# Patient Record
Sex: Female | Born: 1980
Health system: Southern US, Community
[De-identification: ages and names within clinical notes are randomized; demographics above are authoritative.]

## PROBLEM LIST (undated history)

## (undated) DIAGNOSIS — K219 Gastro-esophageal reflux disease without esophagitis: Secondary | ICD-10-CM

## (undated) DIAGNOSIS — M549 Dorsalgia, unspecified: Secondary | ICD-10-CM

## (undated) DIAGNOSIS — T7840XA Allergy, unspecified, initial encounter: Secondary | ICD-10-CM

## (undated) DIAGNOSIS — E538 Deficiency of other specified B group vitamins: Secondary | ICD-10-CM

## (undated) DIAGNOSIS — M1990 Unspecified osteoarthritis, unspecified site: Secondary | ICD-10-CM

## (undated) DIAGNOSIS — R609 Edema, unspecified: Secondary | ICD-10-CM

## (undated) DIAGNOSIS — F419 Anxiety disorder, unspecified: Secondary | ICD-10-CM

## (undated) DIAGNOSIS — D649 Anemia, unspecified: Secondary | ICD-10-CM

## (undated) DIAGNOSIS — Z9289 Personal history of other medical treatment: Secondary | ICD-10-CM

## (undated) DIAGNOSIS — U071 COVID-19: Secondary | ICD-10-CM

## (undated) DIAGNOSIS — I10 Essential (primary) hypertension: Secondary | ICD-10-CM

## (undated) DIAGNOSIS — R002 Palpitations: Secondary | ICD-10-CM

## (undated) DIAGNOSIS — D509 Iron deficiency anemia, unspecified: Secondary | ICD-10-CM

## (undated) DIAGNOSIS — G43909 Migraine, unspecified, not intractable, without status migrainosus: Secondary | ICD-10-CM

## (undated) HISTORY — DX: Anxiety disorder, unspecified: F41.9

## (undated) HISTORY — DX: Anemia, unspecified: D64.9

## (undated) HISTORY — DX: Essential (primary) hypertension: I10

## (undated) HISTORY — DX: Edema, unspecified: R60.9

## (undated) HISTORY — DX: Personal history of other medical treatment: Z92.89

## (undated) HISTORY — DX: Iron deficiency anemia, unspecified: D50.9

## (undated) HISTORY — DX: Deficiency of other specified B group vitamins: E53.8

## (undated) HISTORY — DX: Gastro-esophageal reflux disease without esophagitis: K21.9

## (undated) HISTORY — DX: Palpitations: R00.2

## (undated) HISTORY — DX: Allergy, unspecified, initial encounter: T78.40XA

## (undated) HISTORY — PX: REDUCTION MAMMAPLASTY: SUR839

## (undated) HISTORY — PX: HIGH RISK BREAST EXCISION: SHX6773

## (undated) HISTORY — DX: Unspecified osteoarthritis, unspecified site: M19.90

## (undated) HISTORY — PX: BREAST SURGERY: SHX581

## (undated) HISTORY — DX: Migraine, unspecified, not intractable, without status migrainosus: G43.909

## (undated) HISTORY — DX: Dorsalgia, unspecified: M54.9

## (undated) HISTORY — PX: CHOLECYSTECTOMY: SHX55

---

## 2000-03-16 ENCOUNTER — Other Ambulatory Visit: Admission: RE | Admit: 2000-03-16 | Discharge: 2000-03-16 | Payer: Self-pay | Admitting: *Deleted

## 2000-08-18 ENCOUNTER — Inpatient Hospital Stay (HOSPITAL_COMMUNITY): Admission: AD | Admit: 2000-08-18 | Discharge: 2000-08-18 | Payer: Self-pay | Admitting: Obstetrics & Gynecology

## 2000-10-07 ENCOUNTER — Inpatient Hospital Stay (HOSPITAL_COMMUNITY): Admission: AD | Admit: 2000-10-07 | Discharge: 2000-10-09 | Payer: Self-pay | Admitting: *Deleted

## 2000-11-08 ENCOUNTER — Other Ambulatory Visit: Admission: RE | Admit: 2000-11-08 | Discharge: 2000-11-08 | Payer: Self-pay | Admitting: *Deleted

## 2004-10-19 ENCOUNTER — Emergency Department (HOSPITAL_COMMUNITY): Admission: EM | Admit: 2004-10-19 | Discharge: 2004-10-19 | Payer: Self-pay | Admitting: Family Medicine

## 2005-01-02 ENCOUNTER — Observation Stay (HOSPITAL_COMMUNITY): Admission: AD | Admit: 2005-01-02 | Discharge: 2005-01-03 | Payer: Self-pay | Admitting: Obstetrics

## 2005-01-03 ENCOUNTER — Inpatient Hospital Stay (HOSPITAL_COMMUNITY): Admission: AD | Admit: 2005-01-03 | Discharge: 2005-01-04 | Payer: Self-pay | Admitting: Obstetrics

## 2005-01-10 ENCOUNTER — Inpatient Hospital Stay (HOSPITAL_COMMUNITY): Admission: AD | Admit: 2005-01-10 | Discharge: 2005-01-13 | Payer: Self-pay | Admitting: Obstetrics

## 2005-01-11 ENCOUNTER — Encounter (INDEPENDENT_AMBULATORY_CARE_PROVIDER_SITE_OTHER): Payer: Self-pay | Admitting: Specialist

## 2005-05-28 ENCOUNTER — Emergency Department (HOSPITAL_COMMUNITY): Admission: EM | Admit: 2005-05-28 | Discharge: 2005-05-28 | Payer: Self-pay | Admitting: Family Medicine

## 2005-08-20 ENCOUNTER — Emergency Department (HOSPITAL_COMMUNITY): Admission: EM | Admit: 2005-08-20 | Discharge: 2005-08-20 | Payer: Self-pay | Admitting: Family Medicine

## 2005-08-21 ENCOUNTER — Emergency Department (HOSPITAL_COMMUNITY): Admission: EM | Admit: 2005-08-21 | Discharge: 2005-08-21 | Payer: Self-pay | Admitting: Family Medicine

## 2006-04-15 ENCOUNTER — Emergency Department (HOSPITAL_COMMUNITY): Admission: EM | Admit: 2006-04-15 | Discharge: 2006-04-15 | Payer: Self-pay | Admitting: Family Medicine

## 2006-05-07 ENCOUNTER — Encounter: Payer: Self-pay | Admitting: Surgery

## 2006-05-07 ENCOUNTER — Ambulatory Visit (HOSPITAL_COMMUNITY): Admission: RE | Admit: 2006-05-07 | Discharge: 2006-05-07 | Payer: Self-pay | Admitting: Surgery

## 2006-05-14 ENCOUNTER — Encounter: Admission: RE | Admit: 2006-05-14 | Discharge: 2006-05-14 | Payer: Self-pay | Admitting: Surgery

## 2006-10-12 HISTORY — PX: GASTRIC BYPASS: SHX52

## 2007-01-10 ENCOUNTER — Encounter: Admission: RE | Admit: 2007-01-10 | Discharge: 2007-04-10 | Payer: Self-pay | Admitting: Surgery

## 2007-01-24 ENCOUNTER — Inpatient Hospital Stay (HOSPITAL_COMMUNITY): Admission: RE | Admit: 2007-01-24 | Discharge: 2007-01-27 | Payer: Self-pay | Admitting: Surgery

## 2007-01-25 ENCOUNTER — Encounter: Payer: Self-pay | Admitting: Vascular Surgery

## 2007-01-25 ENCOUNTER — Ambulatory Visit: Payer: Self-pay | Admitting: Vascular Surgery

## 2007-05-04 ENCOUNTER — Encounter: Admission: RE | Admit: 2007-05-04 | Discharge: 2007-06-13 | Payer: Self-pay | Admitting: Surgery

## 2007-08-19 ENCOUNTER — Other Ambulatory Visit: Admission: RE | Admit: 2007-08-19 | Discharge: 2007-08-19 | Payer: Self-pay | Admitting: Family Medicine

## 2007-11-25 ENCOUNTER — Emergency Department (HOSPITAL_COMMUNITY): Admission: EM | Admit: 2007-11-25 | Discharge: 2007-11-25 | Payer: Self-pay | Admitting: Emergency Medicine

## 2007-12-01 ENCOUNTER — Emergency Department (HOSPITAL_COMMUNITY): Admission: EM | Admit: 2007-12-01 | Discharge: 2007-12-01 | Payer: Self-pay | Admitting: Family Medicine

## 2007-12-26 ENCOUNTER — Emergency Department (HOSPITAL_COMMUNITY): Admission: EM | Admit: 2007-12-26 | Discharge: 2007-12-26 | Payer: Self-pay | Admitting: Family Medicine

## 2008-01-12 ENCOUNTER — Emergency Department (HOSPITAL_COMMUNITY): Admission: EM | Admit: 2008-01-12 | Discharge: 2008-01-12 | Payer: Self-pay | Admitting: Family Medicine

## 2008-09-25 ENCOUNTER — Emergency Department (HOSPITAL_COMMUNITY): Admission: EM | Admit: 2008-09-25 | Discharge: 2008-09-25 | Payer: Self-pay | Admitting: Emergency Medicine

## 2008-10-12 HISTORY — PX: OOPHORECTOMY: SHX86

## 2009-09-11 ENCOUNTER — Other Ambulatory Visit: Admission: RE | Admit: 2009-09-11 | Discharge: 2009-09-11 | Payer: Self-pay | Admitting: Family Medicine

## 2009-10-08 ENCOUNTER — Ambulatory Visit (HOSPITAL_COMMUNITY): Admission: RE | Admit: 2009-10-08 | Discharge: 2009-10-09 | Payer: Self-pay | Admitting: Obstetrics and Gynecology

## 2009-10-08 ENCOUNTER — Encounter (INDEPENDENT_AMBULATORY_CARE_PROVIDER_SITE_OTHER): Payer: Self-pay | Admitting: Obstetrics and Gynecology

## 2009-11-15 ENCOUNTER — Emergency Department (HOSPITAL_COMMUNITY): Admission: EM | Admit: 2009-11-15 | Discharge: 2009-11-15 | Payer: Self-pay | Admitting: Emergency Medicine

## 2010-01-07 ENCOUNTER — Inpatient Hospital Stay (HOSPITAL_COMMUNITY): Admission: AD | Admit: 2010-01-07 | Discharge: 2010-01-07 | Payer: Self-pay | Admitting: Obstetrics and Gynecology

## 2010-06-19 ENCOUNTER — Inpatient Hospital Stay (HOSPITAL_COMMUNITY): Admission: AD | Admit: 2010-06-19 | Discharge: 2010-06-21 | Payer: Self-pay | Admitting: Obstetrics and Gynecology

## 2010-06-19 ENCOUNTER — Encounter (INDEPENDENT_AMBULATORY_CARE_PROVIDER_SITE_OTHER): Payer: Self-pay | Admitting: Obstetrics and Gynecology

## 2010-09-27 ENCOUNTER — Emergency Department (HOSPITAL_COMMUNITY)
Admission: EM | Admit: 2010-09-27 | Discharge: 2010-09-27 | Payer: Self-pay | Source: Home / Self Care | Admitting: Family Medicine

## 2010-10-11 ENCOUNTER — Emergency Department (HOSPITAL_COMMUNITY)
Admission: EM | Admit: 2010-10-11 | Discharge: 2010-10-11 | Payer: Self-pay | Source: Home / Self Care | Admitting: Family Medicine

## 2010-11-02 ENCOUNTER — Encounter: Payer: Self-pay | Admitting: Obstetrics and Gynecology

## 2010-12-25 LAB — CBC
HCT: 31.9 % — ABNORMAL LOW (ref 36.0–46.0)
Hemoglobin: 8.2 g/dL — ABNORMAL LOW (ref 12.0–15.0)
MCH: 24.8 pg — ABNORMAL LOW (ref 26.0–34.0)
MCV: 75.6 fL — ABNORMAL LOW (ref 78.0–100.0)
MCV: 75.7 fL — ABNORMAL LOW (ref 78.0–100.0)
RBC: 3.33 MIL/uL — ABNORMAL LOW (ref 3.87–5.11)
RBC: 4.22 MIL/uL (ref 3.87–5.11)
WBC: 4.7 10*3/uL (ref 4.0–10.5)
WBC: 8 10*3/uL (ref 4.0–10.5)

## 2010-12-25 LAB — URINALYSIS, ROUTINE W REFLEX MICROSCOPIC
Bilirubin Urine: NEGATIVE
Glucose, UA: NEGATIVE mg/dL
Hgb urine dipstick: NEGATIVE
Ketones, ur: NEGATIVE mg/dL
pH: 6 (ref 5.0–8.0)

## 2010-12-31 LAB — POCT RAPID STREP A (OFFICE): Streptococcus, Group A Screen (Direct): POSITIVE — AB

## 2011-01-02 LAB — CBC
Hemoglobin: 9.8 g/dL — ABNORMAL LOW (ref 12.0–15.0)
MCHC: 32.1 g/dL (ref 30.0–36.0)
MCV: 75.7 fL — ABNORMAL LOW (ref 78.0–100.0)
RBC: 4.03 MIL/uL (ref 3.87–5.11)
WBC: 8.1 10*3/uL (ref 4.0–10.5)

## 2011-01-02 LAB — ABO/RH: ABO/RH(D): A POS

## 2011-01-12 LAB — CBC
HCT: 32.8 % — ABNORMAL LOW (ref 36.0–46.0)
Hemoglobin: 7.8 g/dL — ABNORMAL LOW (ref 12.0–15.0)
MCHC: 30.7 g/dL (ref 30.0–36.0)
Platelets: 458 10*3/uL — ABNORMAL HIGH (ref 150–400)
RBC: 3.46 MIL/uL — ABNORMAL LOW (ref 3.87–5.11)
RDW: 16.7 % — ABNORMAL HIGH (ref 11.5–15.5)
WBC: 5.8 10*3/uL (ref 4.0–10.5)
WBC: 7 10*3/uL (ref 4.0–10.5)

## 2011-01-12 LAB — ABO/RH: ABO/RH(D): A POS

## 2011-01-12 LAB — TYPE AND SCREEN: ABO/RH(D): A POS

## 2011-01-12 LAB — HEMOGLOBIN AND HEMATOCRIT, BLOOD
HCT: 25.4 % — ABNORMAL LOW (ref 36.0–46.0)
Hemoglobin: 8 g/dL — ABNORMAL LOW (ref 12.0–15.0)

## 2011-02-27 NOTE — Discharge Summary (Signed)
Meredith Byrd, ARCHBOLD          ACCOUNT NO.:  [id number]   MEDICAL RECORD NO.:  [id number]          PATIENT TYPE:  INP   LOCATION:  1605                         FACILITY:  [facility]   PHYSICIAN:  Sandria Bales. Ezzard Standing, M.D.  DATE OF BIRTH:  1981-06-17   DATE OF ADMISSION:  01/24/2007  DATE OF DISCHARGE:  01/27/2007                               DISCHARGE SUMMARY   DISCHARGE DIAGNOSES:  1. Morbid obesity with a body mass index (BMI) of 52, her weight is      262.  2. Knee and back pain/arthritic changes.  3. History of gestational diabetes mellitus.   OPERATIONS PERFORMED:  The patient underwent a laparoscopic Roux-en-Y  gastric bypass on the 14th of April, 2008.   HISTORY OF ILLNESS:  This is a 30 year old black female, who is a  patient of Dr. Deatra James, who has been morbidly obese much of her adult  life.  She has been through our bariatric preop program and now comes  for attempted laparoscopic Roux-en-Y gastric bypass.   She is troubled with some chronic back and knee pain, and she had a  history of gestational diabetes as comorbid problems associated with her  obesity.   HOSPITAL COURSE:  On the day of admission, she was taken to the  operating room where she underwent a laparoscopic Roux-en-Y gastric  bypass.   She did well from this procedure.  On the first postoperative day, she  was sore but doing well, her hemoglobin was 10, white blood count of  9900, she had a swallow done, which showed slow emptying; therefore, I  kept her NPO for an additional 24 hours.  Secondly, she had lower  extremity Doppler's, which showed no DVT.   On the second postoperative day, she was doing well, she had no nausea,  her hemoglobin was 9.5, hematocrit 29.7, white blood count of 6000.  Her  abdomen showed some rare bowel sounds.  I went on and decided to go  ahead and start her liquids and see how she would do with this.   By the third day, she was doing well, she had had no nausea, and  she  appeared to be ready for discharge.   DISCHARGE INSTRUCTIONS:  1. Clear liquids with her protein supplement per gastric diet.  2. She can walk, but no heavy lifting for about two weeks.  3. She already attends a bariatric support group.   FOLLOWUP:  1. She will see me back on the 23rd of April, 2008.  2. She is to call Dr. Gaynell Face for routine care.  3. She is going to a nutritionist on the 23rd of April, also.  4. She was given Roxicet liquid for pain.  5. She knows to call if there is any significant problem or concern.      Sandria Bales. Ezzard Standing, M.D.  Electronically Signed     DHN/MEDQ  D:  04/08/2007  T:  04/08/2007  Job:  [id number]   cc:   Deatra James, M.D.  Fax: [phone]   Kathreen Cosier, M.D.  Fax: [phone]

## 2011-02-27 NOTE — Op Note (Signed)
Meredith Byrd, Meredith Byrd          ACCOUNT NO.:  [id number]   MEDICAL RECORD NO.:  [id number]          PATIENT TYPE:  INP   LOCATION:  0002                         FACILITY:  [facility]   PHYSICIAN:  Meredith Byrd, M.D.  DATE OF BIRTH:  [birth year]   DATE OF PROCEDURE:  01/24/2007  DATE OF DISCHARGE:                               OPERATIVE REPORT   PREOPERATIVE DIAGNOSIS:  Morbid obesity with a BMI of 52.1, and a weight  of 262 pounds.   POSTOPERATIVE DIAGNOSIS:  Morbid obesity with a BMI of 52.1, and a  weight of 262 pounds.   PROCEDURE:  1. Laparoscopic Roux-en-Y bypass, anticolic, antigastric.  2. Upper endoscopy.   SURGEON:  Ovidio Kin, M.D.   FIRST ASSISTANT:  Thornton Park. Daphine Deutscher, MD   ANESTHESIA:  General endotracheal.   ESTIMATED BLOOD LOSS:  Minimal.   INDICATIONS FOR PROCEDURE:  [name] is a 30 year-old black female  who is morbidly obese and has been overweight much of her adult life.  She has been to our preoperative Bariatric program; and now comes for  attempted laparoscopic Roux-en-Y gastric bypass.   Indications for procedure, complications of surgery explained to the  patient.  Procedure complications include: bleeding, infection, bowel  leak, deep venous thrombosis, clots to her lung, and long-term  nutritional consequences.  Also, of note, her mother has had bypass  surgery; so she is well-informed of the operation and its consequences.   OPERATIVE NOTE:  The patient presented to the operating room and  underwent a general endotracheal anesthetic.  She had a timeout,  identifying the patient and the planned procedure.  She had a Foley  catheter in place.  She was given a gram of cefoxitin at the initiation  of the procedure, and had PAS stockings in place.  Her abdomen was  prepped with Betadine solution and sterilely draped.   Her abdominal cavity was accessed using the Opti-View Ethicon 12-mm  trocar to access the left upper quadrant.  I then  placed six additional  trocars a 5-mm trocar in the subxiphoid location for the liver  retractor, a right subcostal 12-mm trocar, a right paramedian trocar, a  left paramedian 12-mm trocar, a lateral 5-mm trocar, and then an 11-mm  trocar to the left, right, and below her umbilicus.  The abdominal  exploration revealed the right-and-left lobes of the liver unremarkable.  Anterior wall of the stomach was also unremarkable.  The gallbladder was  absent.  The appendix, actually was easily seen.  The remainder of her  colon was unremarkable.  She actually had a lot of her fat in her  abdominal wall with a lot less fat in her abdominal cavity.   I first identified and divided the jejunum.  I counted 40 cm down from  the ligament of Treitz and I divided the jejunum, I put a penrose drain  on the future gastric limb; and then counted 100 cm down this.  I did a  side-to-side, jejunojejunostomy with a 45 mm white load of the Ethicon  stapler.  I then closed the enterotomy with two running 2-0 Vicryl  sutures.  I then closed the mesenteric defect with a running 2-0 silk  suture with laper ties on both ends.  After examining the J-J  anastomosis, and the mesenteric defect closure, I covered the J-J  anastomosis with Tisseal.   I then turned my attention to the upper abdomen and placed the patient  in reverse Trendelenburg.  I went along the gastroesophageal junction on  the left, and opened the angle of HISs.  I then went along the stomach  wall and counted about 5 cm down on the lesser curvature, got into the  lesser sack through the lesser curvature and then divided the stomach.  I used a blue load 45 stapler first.  I then used two blue loads of the  60 mm stapler; on the third stapling QT wall and through this passed an  E-wall tube without difficulty into the pouch to make sure that there  was no impingement on the esophageal junction.  I then passed about  another 45 mm [place] surgical suture  stapler.  I then brought out the  jejunal limb, anticolic, antigastric, and did a posterior running 2-0  Vicryl suture.  I then made enterotomies in the stomach and jejunum; and  used the 45 blue stapler, to fire and create a gastrojejunostomy.  I  then closed this enterotomy defect with two running 2-0 Vicryl sutures.   At this point, I closed the closure anteriorly with a running 2-0 Vicryl  suture.  This took tension off the staple line.  I then closed the  Peterson's defect with a running 2-0 silk between the mesentery of the  jejunum to the transverse colon into the stomach wall.   At this point Dr. Daphine Deutscher broke scrub, went up to the head of the bed  where he endoscoped the patient, while I clamped off the jejunal limb.  He could visualize the distal esophagus, the gastric pouch, and the  gastrojejunostomy.  The gastric pouch was approximately 5 cm in  cylindrical. The patch anastomosis was widely open, and I flooded the  upper abdomen with saline; and there was no leak during this maneuver.   Dr. Daphine Deutscher will dictate his part of the operation.   I then placed Tisseal over the gastrojejunostomy.  I had oversewn the  distal gastric limb with a running 2-0 Vicryl suture, this was actually  before the anastomosis.  I then we inspected the J-J;  I reinspected the  gastrojejunostomy anastomosis, and I felt that there was no tension or  pulling on any of the limbs.  The serosa of the bowel was all pink; and  the procedure had gone well.  The sponge and needle counts were correct  at the end of the case.  She was transferred to the recovery room in  good condition.      Meredith Byrd, M.D.  Electronically Signed     DHN/MEDQ  D:  01/24/2007  T:  01/24/2007  Job:  [id number]   cc:   Meredith Byrd, M.D.  Fax: [phone]   Meredith Byrd, M.D.  Fax: [phone]

## 2011-07-03 LAB — POCT RAPID STREP A: Streptococcus, Group A Screen (Direct): NEGATIVE

## 2011-07-17 LAB — POCT PREGNANCY, URINE: Preg Test, Ur: NEGATIVE

## 2011-11-20 ENCOUNTER — Telehealth (HOSPITAL_COMMUNITY): Payer: Self-pay | Admitting: *Deleted

## 2011-11-20 NOTE — ED Notes (Addendum)
Pt. called and asked for the name of the medication that was prescribed 10/11/10, so she can tell her doctor. I told her it was Butorphanal tartrate nasal spray 10 mg/ml. 1 spray in one nostril every 3-4 hrs prn pain. Vassie Moselle 11/20/2011

## 2013-04-13 ENCOUNTER — Other Ambulatory Visit: Payer: Self-pay | Admitting: Gastroenterology

## 2013-04-13 DIAGNOSIS — R109 Unspecified abdominal pain: Secondary | ICD-10-CM

## 2013-04-20 ENCOUNTER — Other Ambulatory Visit: Payer: Self-pay

## 2013-04-20 ENCOUNTER — Ambulatory Visit
Admission: RE | Admit: 2013-04-20 | Discharge: 2013-04-20 | Disposition: A | Discharge status: Home / Self Care | Payer: 59 | Source: Ambulatory Visit | Attending: Gastroenterology | Admitting: Gastroenterology

## 2013-04-20 DIAGNOSIS — R109 Unspecified abdominal pain: Secondary | ICD-10-CM

## 2013-04-20 MED ORDER — IOHEXOL 300 MG/ML  SOLN
125.0000 mL | Freq: Once | INTRAMUSCULAR | Status: AC | PRN
  Administered 2013-04-20: 125 mL via INTRAVENOUS

## 2014-03-22 ENCOUNTER — Ambulatory Visit (INDEPENDENT_AMBULATORY_CARE_PROVIDER_SITE_OTHER): Payer: 59 | Admitting: Family Medicine

## 2014-03-22 ENCOUNTER — Encounter: Payer: Self-pay | Admitting: Family Medicine

## 2014-03-22 VITALS — BP 142/86 | HR 97 | Temp 99.3°F | Wt 213.0 lb

## 2014-03-22 DIAGNOSIS — G43909 Migraine, unspecified, not intractable, without status migrainosus: Secondary | ICD-10-CM

## 2014-03-22 DIAGNOSIS — E559 Vitamin D deficiency, unspecified: Secondary | ICD-10-CM

## 2014-03-22 DIAGNOSIS — G4700 Insomnia, unspecified: Secondary | ICD-10-CM | POA: Insufficient documentation

## 2014-03-22 DIAGNOSIS — R002 Palpitations: Secondary | ICD-10-CM

## 2014-03-22 DIAGNOSIS — D649 Anemia, unspecified: Secondary | ICD-10-CM

## 2014-03-22 DIAGNOSIS — I10 Essential (primary) hypertension: Secondary | ICD-10-CM

## 2014-03-22 DIAGNOSIS — K219 Gastro-esophageal reflux disease without esophagitis: Secondary | ICD-10-CM

## 2014-03-22 DIAGNOSIS — Z9884 Bariatric surgery status: Secondary | ICD-10-CM

## 2014-03-22 LAB — CBC
HEMATOCRIT: 26.3 % — AB (ref 36.0–46.0)
HEMOGLOBIN: 7.4 g/dL — AB (ref 12.0–15.0)
MCH: 16.5 pg — AB (ref 26.0–34.0)
MCHC: 28.1 g/dL — AB (ref 30.0–36.0)
MCV: 58.6 fL — AB (ref 78.0–100.0)
Platelets: 491 10*3/uL — ABNORMAL HIGH (ref 150–400)
RBC: 4.49 MIL/uL (ref 3.87–5.11)
RDW: 22.8 % — ABNORMAL HIGH (ref 11.5–15.5)
WBC: 5.6 10*3/uL (ref 4.0–10.5)

## 2014-03-22 MED ORDER — BUTORPHANOL TARTRATE 10 MG/ML NA SOLN: 1.0000 | NASAL | Status: DC | PRN

## 2014-03-22 MED ORDER — TRAZODONE HCL 50 MG PO TABS: 100.0000 mg | ORAL_TABLET | Freq: Every evening | ORAL | Status: DC | PRN

## 2014-03-22 MED ORDER — GABAPENTIN 300 MG PO CAPS: 300.0000 mg | ORAL_CAPSULE | Freq: Three times a day (TID) | ORAL | Status: DC

## 2014-03-22 MED ORDER — CILIDINIUM-CHLORDIAZEPOXIDE 2.5-5 MG PO CAPS: ORAL_CAPSULE | ORAL | Status: DC

## 2014-03-22 MED ORDER — ALOSETRON HCL 0.5 MG PO TABS: 0.5000 mg | ORAL_TABLET | Freq: Every day | ORAL | Status: DC

## 2014-03-22 MED ORDER — AMLODIPINE BESYLATE 10 MG PO TABS: 10.0000 mg | ORAL_TABLET | Freq: Every day | ORAL | Status: DC

## 2014-03-22 NOTE — Progress Notes (Signed)
Subjective:    Patient ID: Meredith Byrd, female    DOB: 1981/03/27, 33 y.o.   MRN: [id number]  HPI 33 year old female presents today to establish care.  Current concerns:  1) Palpitations  Patient reports that she has had palpitations for approximately 4-5 months.  She states that they occur intermittently.  No inciting, exacerbating, or alleviating factors.  She reports that palpitations last for 10-15 minutes and then subsequently resolve.  Some episodes of palpitations are associated with sternal chest pain.    She's taken no medications for this.  She states that she has had this before.  She states that she saw a cardiologist at that time and had an unremarkable workup.  PMH, Surgical Hx, Family Hx, Social Hx, medications reviewed (see below). Past Medical History  Diagnosis Date  . Allergy   . Anemia   . GERD (gastroesophageal reflux disease)   . Hypertension   . Migraines   . Palpitations    Past Surgical History  Procedure Laterality Date  . Cholecystectomy    . Breast surgery    . Gastric bypass  2008  . Oophorectomy Right 2010   Family History  Problem Relation Age of Onset  . Asthma Mother   . Heart disease Mother   . Diabetes Mother   . Early death Mother   . Hypertension Mother   . Stroke Maternal Grandmother   . Heart disease Maternal Grandmother   . Hypertension Maternal Grandmother   . Hyperlipidemia Maternal Grandmother    History   Social History  . Marital Status: Married    Spouse Name: N/A    Number of Children: N/A  . Years of Education: N/A   Social History Main Topics  . Smoking status: Never Smoker   . Smokeless tobacco: Not on file  . Alcohol Use: Yes     Comment: Occasionally; 1x/month  . Drug Use: No  . Sexual Activity: Yes   Other Topics Concern  . Not on file   Social History Narrative  . No narrative on file   Medications: Current outpatient prescriptions:alosetron (LOTRONEX) 0.5 MG tablet, Take 1  tablet (0.5 mg total) by mouth daily., Disp: 90 tablet, Rfl: 0;  amLODipine (NORVASC) 10 MG tablet, Take 1 tablet (10 mg total) by mouth daily., Disp: 90 tablet, Rfl: 3;  butorphanol (STADOL) 10 MG/ML nasal spray, Place 1 spray into the nose every 4 (four) hours as needed for headache., Disp: 2.5 mL, Rfl: 0 clidinium-chlordiazePOXIDE (LIBRAX) 5-2.5 MG per capsule, 2 capsules 4 times daily., Disp: 60 capsule, Rfl: 3;  gabapentin (NEURONTIN) 300 MG capsule, Take 1 capsule (300 mg total) by mouth 3 (three) times daily., Disp: 90 capsule, Rfl: 3;  traZODone (DESYREL) 50 MG tablet, Take 2 tablets (100 mg total) by mouth at bedtime as needed for sleep., Disp: 60 tablet, Rfl: 6  Allergies: NKDA  Review of Systems Per HPI.    Objective:   Physical Exam Filed Vitals:   03/22/14 1116  BP: 142/86  Pulse: 97  Temp: 99.3 F (37.4 C)   Exam: General: well appearing obese female in NAD.  HEENT: NCAT. Oropharynx clear.  Dental carie noted (right upper molar).   Cardiovascular: RRR. 2/6 Systolic murmur noted. Respiratory: CTAB. No rales, rhonchi, or wheeze. Abdomen: obese, soft, nontender, nondistended. Extremities: Trace LE edema.    Assessment & Plan:  See Problem List  45 minutes were spent face-to-face with the patient during this encounter and over half of that time was  spent on counseling and coordination of care.

## 2014-03-22 NOTE — Assessment & Plan Note (Signed)
Patient with history of gastric bypass.  Patient still obese with weight of 213. I gave patient Dr. Larae Grooms information so that she can schedule an appointment for nutritional counseling/advice.

## 2014-03-22 NOTE — Assessment & Plan Note (Signed)
Trazodone refilled.

## 2014-03-22 NOTE — Patient Instructions (Addendum)
It was nice to see you today.  I have refilled your medications.  I have also prescribed gabapentin for you numbness/tingling.  You will receive a letter regarding you lab work up.  Follow up in  ~ 6 months.

## 2014-03-22 NOTE — Assessment & Plan Note (Signed)
Well controlled. Goal BP <140/90. Will continue Losartan and Norvasc. Patient unsure of dose of losartan. Patient is to call back with dose so this can be refilled for her.

## 2014-03-22 NOTE — Assessment & Plan Note (Signed)
Unclear etiology.  DDx: Paroxysmal atrial fibrillation, paroxysmal SVT. Patient in need of Holter monitor.  Will place referral to cardiology for evaluation/cardiac monitoring.  I do not proceed with EKG today as patient was in sinus rhythm and thus unlikely to have any significant changes on EKG.

## 2014-03-22 NOTE — Assessment & Plan Note (Signed)
Patient reports history of iron deficiency as well as B12 deficiency. Obtaining anemia panel for evaluation today (recent labs not available in EMR).

## 2014-03-23 ENCOUNTER — Encounter: Payer: Self-pay | Admitting: Family Medicine

## 2014-03-23 LAB — COMPLETE METABOLIC PANEL WITH GFR
ALK PHOS: 64 U/L (ref 39–117)
ALT: 22 U/L (ref 0–35)
AST: 66 U/L — ABNORMAL HIGH (ref 0–37)
Albumin: 4.2 g/dL (ref 3.5–5.2)
BILIRUBIN TOTAL: 0.4 mg/dL (ref 0.2–1.2)
BUN: 5 mg/dL — AB (ref 6–23)
CO2: 23 mEq/L (ref 19–32)
Calcium: 8.8 mg/dL (ref 8.4–10.5)
Chloride: 104 mEq/L (ref 96–112)
Creat: 0.65 mg/dL (ref 0.50–1.10)
GFR, Est African American: >89 mL/min
GLUCOSE: 61 mg/dL — AB (ref 70–99)
Potassium: 5 mEq/L (ref 3.5–5.3)
Sodium: 138 mEq/L (ref 135–145)
Total Protein: 7.4 g/dL (ref 6.0–8.3)

## 2014-03-23 LAB — ANEMIA PANEL
%SAT: 2 % — ABNORMAL LOW (ref 20–55)
ABS RETIC: 40.4 10*3/uL (ref 19.0–186.0)
Ferritin: 4 ng/mL — ABNORMAL LOW (ref 10–291)
Folate: 13.2 ng/mL
IRON: 10 ug/dL — AB (ref 42–145)
RBC.: 4.49 MIL/uL (ref 3.87–5.11)
RETIC CT PCT: 0.9 % (ref 0.4–2.3)
TIBC: 492 ug/dL — ABNORMAL HIGH (ref 250–470)
UIBC: 482 ug/dL — AB (ref 125–400)
VITAMIN B 12: 333 pg/mL (ref 211–911)

## 2014-03-23 LAB — LIPID PANEL
Cholesterol: 108 mg/dL (ref 0–200)
HDL: 44 mg/dL (ref >39)
LDL Cholesterol: 54 mg/dL (ref 0–99)
TRIGLYCERIDES: 51 mg/dL (ref <150)
Total CHOL/HDL Ratio: 2.5 Ratio
VLDL: 10 mg/dL (ref 0–40)

## 2014-03-23 LAB — VITAMIN D 25 HYDROXY (VIT D DEFICIENCY, FRACTURES): VIT D 25 HYDROXY: 13 ng/mL — AB (ref 30–89)

## 2014-03-28 ENCOUNTER — Telehealth: Payer: Self-pay | Admitting: Family Medicine

## 2014-03-28 NOTE — Telephone Encounter (Signed)
I will forward to white team to see when we can get this setup.  Alvino ChapelJo,   Patient needs outpatient IV Iron.  I don't know where she has to go for this.

## 2014-03-28 NOTE — Telephone Encounter (Signed)
Placed form to be filled out on this in your box

## 2014-03-28 NOTE — Telephone Encounter (Signed)
Left voicemail regarding lab results.

## 2014-03-28 NOTE — Telephone Encounter (Signed)
Patient received lab results in mail, wanted to converse with your regarding results.  Called to speak with you and need you to contact her back at earliest convenience.  If she is not able to answer.  Please leave her a detailed message and if you wish for her to return call, please leave a number where you can be reached.

## 2014-03-28 NOTE — Telephone Encounter (Signed)
[name] want to know if she is going to see you on the same day of the procedure for the IV or is it to be separate dates.  Please call her back to inform

## 2014-03-30 ENCOUNTER — Other Ambulatory Visit (HOSPITAL_COMMUNITY): Payer: Self-pay | Admitting: *Deleted

## 2014-04-02 ENCOUNTER — Inpatient Hospital Stay (HOSPITAL_COMMUNITY): Admission: RE | Admit: 2014-04-02 | Payer: 59 | Source: Ambulatory Visit

## 2014-04-04 ENCOUNTER — Telehealth: Payer: Self-pay | Admitting: Family Medicine

## 2014-04-04 MED ORDER — GABAPENTIN 300 MG PO CAPS: 600.0000 mg | ORAL_CAPSULE | Freq: Three times a day (TID) | ORAL | Status: DC

## 2014-04-04 NOTE — Telephone Encounter (Signed)
Patient also will like the Gabapentin to another medication she states that her and her boyfriend does not like the way it makes her feel. It looks like the IV was set up for 6/22 @ am. I had faxed over the form completed to them. It seems like they did not contact patient about this appointment, I will call and reschedule.

## 2014-04-04 NOTE — Telephone Encounter (Signed)
I do not know status of cardiology referral.  This would need to come from staff. Regarding IV iron, once again I do not know if they do this on the weekend. Defer to [place] DecSara. She needs follow up lab work in 1 month after IV Iron and then repeat IV Iron if needed (so she would have to be seen). Will increase dose of Gabapentin for sciatica - Patient to take 600 mg TID.

## 2014-04-04 NOTE — Addendum Note (Signed)
Addended by: Tommie SamsOOK, JAYCE G on: 04/04/2014 01:45 PM   Modules accepted: Orders, Medications

## 2014-04-04 NOTE — Telephone Encounter (Signed)
Please advise.thank you. Meredith Byrd, Meredith Byrd  

## 2014-04-04 NOTE — Telephone Encounter (Signed)
Patient calls with a few questions. She states that Dr. Adriana Simasook wants her to get IV transfusion for iron, she is wanting to know will this be scheduled or does she need to come see Dr. Adriana Simasook again first? Also, could this be scheduled on the weekend, instead of weekdays so she does not have to miss work? Gabapentin not working for sciatica, would like to try something else. Also, status of cardiologist referral.

## 2014-04-05 MED ORDER — PREGABALIN 50 MG PO CAPS: 50.0000 mg | ORAL_CAPSULE | Freq: Three times a day (TID) | ORAL | Status: DC

## 2014-04-05 NOTE — Addendum Note (Signed)
Addended by: Tommie SamsOOK, Aireanna Luellen G on: 04/05/2014 02:47 PM   Modules accepted: Orders

## 2014-04-06 ENCOUNTER — Telehealth: Payer: Self-pay | Admitting: Family Medicine

## 2014-04-06 NOTE — Telephone Encounter (Signed)
I spoke with patient again about this and discussed the infusion. She wanted to know if this is something that would need to be done for the rest of her life or intermittently. I explained to her that it would all depend how she responds to the IV infusions. She is wanting to know that based on her labs is this something she will actually be able to hold off until September due to her job, or would you like her to get done sooner.

## 2014-04-06 NOTE — Telephone Encounter (Signed)
I would like her to get at least 1 injection given her Hemoglobin of 7.4.

## 2014-04-06 NOTE — Telephone Encounter (Signed)
Pt wants to know how many iron infusions dr wants done. She is unable to get in to have until Sept, Is that ok? Please advise

## 2014-04-10 ENCOUNTER — Encounter (HOSPITAL_COMMUNITY): Payer: 59

## 2014-04-11 ENCOUNTER — Telehealth: Payer: Self-pay | Admitting: Internal Medicine

## 2014-04-11 NOTE — Telephone Encounter (Signed)
Lyrica is not touching the pain. Can he call in something stronger? This is for her and her husband [name]  [birth year]

## 2014-04-11 NOTE — Telephone Encounter (Signed)
Spoke with patient and she would not like a new medication just a dosage upage

## 2014-04-12 ENCOUNTER — Other Ambulatory Visit: Payer: Self-pay | Admitting: Family Medicine

## 2014-04-12 ENCOUNTER — Telehealth: Payer: Self-pay | Admitting: Family Medicine

## 2014-04-12 MED ORDER — PREGABALIN 100 MG PO CAPS: 100.0000 mg | ORAL_CAPSULE | Freq: Three times a day (TID) | ORAL | Status: DC

## 2014-04-12 NOTE — Telephone Encounter (Signed)
LVM for patient to call back new rx up front

## 2014-04-12 NOTE — Telephone Encounter (Signed)
Spoke with patient and informed her of below 

## 2014-04-12 NOTE — Telephone Encounter (Signed)
Pt called again to check on status of her request to up the dosage on Lyrica

## 2014-04-12 NOTE — Telephone Encounter (Signed)
Please return her call asap °

## 2014-04-16 NOTE — Telephone Encounter (Signed)
Closed encounter °

## 2014-04-19 ENCOUNTER — Encounter: Payer: Self-pay | Admitting: *Deleted

## 2014-04-27 ENCOUNTER — Encounter: Payer: Self-pay | Admitting: Cardiology

## 2014-05-01 ENCOUNTER — Ambulatory Visit (INDEPENDENT_AMBULATORY_CARE_PROVIDER_SITE_OTHER): Payer: 59 | Admitting: Internal Medicine

## 2014-05-01 ENCOUNTER — Encounter: Payer: Self-pay | Admitting: Internal Medicine

## 2014-05-01 VITALS — BP 118/68 | HR 89 | Ht <= 58 in | Wt 208.1 lb

## 2014-05-01 DIAGNOSIS — Z9884 Bariatric surgery status: Secondary | ICD-10-CM

## 2014-05-01 DIAGNOSIS — G4719 Other hypersomnia: Secondary | ICD-10-CM

## 2014-05-01 DIAGNOSIS — E669 Obesity, unspecified: Secondary | ICD-10-CM

## 2014-05-01 DIAGNOSIS — G4489 Other headache syndrome: Secondary | ICD-10-CM

## 2014-05-01 DIAGNOSIS — R079 Chest pain, unspecified: Secondary | ICD-10-CM

## 2014-05-01 DIAGNOSIS — R0609 Other forms of dyspnea: Secondary | ICD-10-CM

## 2014-05-01 DIAGNOSIS — I10 Essential (primary) hypertension: Secondary | ICD-10-CM

## 2014-05-01 DIAGNOSIS — G4700 Insomnia, unspecified: Secondary | ICD-10-CM

## 2014-05-01 DIAGNOSIS — R0989 Other specified symptoms and signs involving the circulatory and respiratory systems: Secondary | ICD-10-CM

## 2014-05-01 DIAGNOSIS — R002 Palpitations: Secondary | ICD-10-CM

## 2014-05-01 DIAGNOSIS — G4710 Hypersomnia, unspecified: Secondary | ICD-10-CM

## 2014-05-01 DIAGNOSIS — G43109 Migraine with aura, not intractable, without status migrainosus: Secondary | ICD-10-CM

## 2014-05-01 DIAGNOSIS — R0683 Snoring: Secondary | ICD-10-CM

## 2014-05-01 DIAGNOSIS — R5381 Other malaise: Secondary | ICD-10-CM

## 2014-05-01 DIAGNOSIS — R0789 Other chest pain: Secondary | ICD-10-CM

## 2014-05-01 DIAGNOSIS — R5383 Other fatigue: Secondary | ICD-10-CM

## 2014-05-01 NOTE — Progress Notes (Signed)
OFFICE NOTE  Chief Complaint:  Chest pain, palpitations  Primary Care Physician: Meredith Byrd, Meredith Byrd, Meredith Byrd  HPI:  Meredith Byrd is a 33 year old female who was previously seen by Dr. Herbie Byrd in our practice in 2012. She's had a history of chest pain which has been present for many years as well as hypertension, obesity and chronic headaches of the migraine type. She underwent a treadmill exercise stress test in 2012. This was negative for ischemia however she did only exercised for 3-1/2 minutes achieving 5.3 metabolic equivalents which is quite poor for a 33 year old. She did great reach 87% of the max predicted heart rate. No chest pain was reported during the study. She also underwent an echocardiogram which showed borderline concentric LVH with EF greater than 55%. There was mild mitral annular calcification and trace mitral regurgitation. An insignificant pericardial effusion was noted. He was placed on losartan for blood pressure control and continues to take that medication. Her main concerns today has to Meredith Byrd with more frequent palpitations and more intense chest pain, which he notes with exertion and at rest. She describes the chest pain is sharp, substernal and nonradiating. Sometimes is worse when she takes a deep breath and it has to cause her to hold her breath. It is rated an 8 or 9/10 but goes away within a few seconds to a minute. She gets short of breath with exercise and does report some swelling in her ankles. She has had about 8 pound weight loss since her last office visit however body mass index is still over 40 and she does have a history of gastric bypass surgery. Blood pressure appears well controlled today. She's not on any AV nodal blockers.  Past Medical History  Diagnosis Date  . Allergy   . Anemia   . GERD (gastroesophageal reflux disease)   . Hypertension   . Migraines   . Palpitations   . Hx of echocardiogram     The echocadiogram was essentially normal with the  exception of mild mitral calcification and borderline concentric LVH, which in the setting of her hypertension at this early age is something that mean her blood pressure is well controlled.   [name]. History of stress test 02/2011 ([facility])    there was no evidence of ischemia, but she only went 3 1/2 minutes on the treadmill making it very difficult to get a good accurate assessment, however    Past Surgical History  Procedure Laterality Date  . Cholecystectomy    . Breast surgery    . Gastric bypass  2008  . Oophorectomy Right 2010    FAMHx:  Family History  Problem Relation Age of Onset  . Asthma Mother   . Heart disease Mother   . Diabetes Mother   . Early death Mother   . Hypertension Mother   . Stroke Maternal Grandmother   . Heart disease Maternal Grandmother   . Hypertension Maternal Grandmother   . Hyperlipidemia Maternal Grandmother     SOCHx:   reports that she has never smoked. She has never used smokeless tobacco. She reports that she does not drink alcohol or use illicit drugs.  ALLERGIES:  No Known Allergies  ROS: A comprehensive review of systems was negative except for: Constitutional: positive for fatigue Respiratory: positive for dyspnea on exertion Cardiovascular: positive for chest pain and palpitations  HOME MEDS: Current Outpatient Prescriptions  Medication Sig Dispense Refill  . alosetron (LOTRONEX) 0.5 MG tablet Take 1 mg by mouth 2 (two) times  daily.      . amLODipine (NORVASC) 10 MG tablet Take 1 tablet (10 mg total) by mouth daily.  90 tablet  3  . butorphanol (STADOL) 10 MG/ML nasal spray Place 1 spray into the nose every 4 (four) hours as needed for headache.  2.5 mL  0  . clidinium-chlordiazePOXIDE (LIBRAX) 5-2.5 MG per capsule 2 capsules 4 times daily.  60 capsule  3  . Cyanocobalamin (VITAMIN B-12 IJ) Inject as directed every 30 (thirty) days.      . MedroxyPROGESTERone Acetate (DEPO-PROVERA IM) Inject into the muscle every 3 (three) months.        . pregabalin (LYRICA) 100 MG capsule Take 1 capsule (100 mg total) by mouth 3 (three) times daily.  90 capsule  3  . traZODone (DESYREL) 50 MG tablet Take 50-100 mg by mouth at bedtime as needed for sleep.      . Vitamin D, Ergocalciferol, (DRISDOL) 50000 UNITS CAPS capsule Take 50,000 Units by mouth every 7 (seven) days.       No current facility-administered medications for this visit.    LABS/IMAGING: No results found for this or any previous visit (from the past 48 hour(s)). No results found.  VITALS: BP 118/68  Pulse 89  Ht 4\' 10"  (1.473 m)  Wt 208 lb 1.6 oz (94.394 kg)  BMI 43.50 kg/m2  EXAM: General appearance: alert and no distress Neck: no carotid bruit and no JVD Lungs: clear to auscultation bilaterally Heart: regular rate and rhythm, S1, S2 normal, no murmur, click, rub or gallop Abdomen: soft, non-tender; bowel sounds normal; no masses,  no organomegaly Extremities: extremities normal, atraumatic, no cyanosis or edema Pulses: 2+ and symmetric Skin: Skin color, texture, turgor normal. No rashes or lesions Neurologic: Grossly normal Psych: Mood, affect normal  EKG: Normal sinus rhythm at 89  ASSESSMENT: 1. Atypical chest pain for cardiac cause 2. Palpitations 3. Dyspnea and exertion 4. Snoring, fatigue, poor sleep, headaches, EPWSS >10 5. HTN - controlled  PLAN: 1.   Mrs. Meredith Byrd is describing more frequent palpitations and chest discomfort which sounds atypical for cardiac chest pain. There is a strong family history of heart disease. I would recommend a repeat treadmill exercise stress test to further evaluate for ischemia. In addition we'll arrange for a one-week event monitor in the office today. She reports she has episodes of palpitations at least 4-5 times a week. She also reports snoring, fatigue, poor sleep, headaches and at sleepiness score greater than 10, this is concerning for possible sleep apnea. I would recommend a sleep apnea evaluation which  controlled her palpitations.  Plan to see her back to discuss results of these studies. Thank you again for the kind referral.  Chrystie Nose, MD, [facility] Attending Cardiologist [facility]  Nateisha Moyd C 05/01/2014, 6:02 PM

## 2014-05-01 NOTE — Patient Instructions (Signed)
Your physician has requested that you have an exercise tolerance test. For further information please visit [email or web address]. Please also follow instruction sheet, as given.  Your physician has recommended that you have a sleep study. This test records several body functions during sleep, including: brain activity, eye movement, oxygen and carbon dioxide blood levels, heart rate and rhythm, breathing rate and rhythm, the flow of air through your mouth and nose, snoring, body muscle movements, and chest and belly movement. ** this is done at [facility]  Your physician has recommended that you wear an event monitor. Event monitors are medical devices that record the heart's electrical activity. Doctors most often us these monitors to diagnose arrhythmias. Arrhythmias are problems with the speed or rhythm of the heartbeat. The monitor is a small, portable device. You can wear one while you do your normal daily activities. This is usually used to diagnose what is causing palpitations/syncope (passing out). ** you will wear this for 1 week  Your physician recommends that you schedule a follow-up appointment in 1 month - after your tests.

## 2014-05-09 ENCOUNTER — Other Ambulatory Visit: Payer: Self-pay | Admitting: *Deleted

## 2014-05-09 DIAGNOSIS — R002 Palpitations: Secondary | ICD-10-CM

## 2014-05-10 ENCOUNTER — Telehealth: Payer: Self-pay | Admitting: Internal Medicine

## 2014-05-10 ENCOUNTER — Telehealth: Payer: Self-pay | Admitting: *Deleted

## 2014-05-10 NOTE — Telephone Encounter (Signed)
Left VM for patient to return call regarding monitor results (SR, ST)

## 2014-05-10 NOTE — Telephone Encounter (Signed)
Returning your call. °

## 2014-05-10 NOTE — Telephone Encounter (Signed)
Patient notified of monitor results - SR and ST. Reminded of sept office visit

## 2014-05-17 ENCOUNTER — Telehealth (HOSPITAL_COMMUNITY): Payer: Self-pay

## 2014-05-17 NOTE — Telephone Encounter (Signed)
Encounter complete. 

## 2014-05-18 ENCOUNTER — Telehealth (HOSPITAL_COMMUNITY): Payer: Self-pay

## 2014-05-18 NOTE — Telephone Encounter (Signed)
Encounter complete. 

## 2014-05-22 ENCOUNTER — Inpatient Hospital Stay (HOSPITAL_COMMUNITY): Admission: RE | Admit: 2014-05-22 | Payer: 59 | Source: Ambulatory Visit

## 2014-05-30 ENCOUNTER — Telehealth: Payer: Self-pay | Admitting: Hematology and Oncology

## 2014-05-30 NOTE — Telephone Encounter (Signed)
LEFT MESSAGE FOR PATIENT AND GAVE NP APPT FOR 08/24 @ 11:15 W/DR. GORSUCH.

## 2014-06-04 ENCOUNTER — Ambulatory Visit: Payer: 59 | Admitting: Hematology and Oncology

## 2014-06-04 ENCOUNTER — Ambulatory Visit: Payer: 59

## 2014-06-05 ENCOUNTER — Encounter (HOSPITAL_COMMUNITY): Payer: Self-pay | Admitting: Emergency Medicine

## 2014-06-05 ENCOUNTER — Inpatient Hospital Stay (HOSPITAL_COMMUNITY)
Admission: EM | Admit: 2014-06-05 | Discharge: 2014-06-08 | DRG: 812 | Disposition: A | Discharge status: Home / Self Care | Payer: 59 | Attending: Internal Medicine | Admitting: Internal Medicine

## 2014-06-05 ENCOUNTER — Emergency Department (HOSPITAL_COMMUNITY): Payer: 59

## 2014-06-05 DIAGNOSIS — G43909 Migraine, unspecified, not intractable, without status migrainosus: Secondary | ICD-10-CM | POA: Diagnosis present

## 2014-06-05 DIAGNOSIS — G4700 Insomnia, unspecified: Secondary | ICD-10-CM

## 2014-06-05 DIAGNOSIS — D509 Iron deficiency anemia, unspecified: Secondary | ICD-10-CM | POA: Diagnosis not present

## 2014-06-05 DIAGNOSIS — Z6839 Body mass index (BMI) 39.0-39.9, adult: Secondary | ICD-10-CM | POA: Diagnosis not present

## 2014-06-05 DIAGNOSIS — I10 Essential (primary) hypertension: Secondary | ICD-10-CM | POA: Diagnosis present

## 2014-06-05 DIAGNOSIS — G43009 Migraine without aura, not intractable, without status migrainosus: Secondary | ICD-10-CM

## 2014-06-05 DIAGNOSIS — Z9079 Acquired absence of other genital organ(s): Secondary | ICD-10-CM | POA: Diagnosis not present

## 2014-06-05 DIAGNOSIS — K219 Gastro-esophageal reflux disease without esophagitis: Secondary | ICD-10-CM | POA: Diagnosis present

## 2014-06-05 DIAGNOSIS — R002 Palpitations: Secondary | ICD-10-CM | POA: Diagnosis present

## 2014-06-05 DIAGNOSIS — D649 Anemia, unspecified: Secondary | ICD-10-CM | POA: Diagnosis present

## 2014-06-05 DIAGNOSIS — Z9884 Bariatric surgery status: Secondary | ICD-10-CM

## 2014-06-05 DIAGNOSIS — D6489 Other specified anemias: Secondary | ICD-10-CM

## 2014-06-05 DIAGNOSIS — R55 Syncope and collapse: Secondary | ICD-10-CM

## 2014-06-05 LAB — CBC
HEMATOCRIT: 26.2 % — AB (ref 36.0–46.0)
Hemoglobin: 7.6 g/dL — ABNORMAL LOW (ref 12.0–15.0)
MCH: 17.2 pg — ABNORMAL LOW (ref 26.0–34.0)
MCHC: 29 g/dL — ABNORMAL LOW (ref 30.0–36.0)
MCV: 59.3 fL — AB (ref 78.0–100.0)
Platelets: 628 10*3/uL — ABNORMAL HIGH (ref 150–400)
RBC: 4.42 MIL/uL (ref 3.87–5.11)
RDW: 21.1 % — ABNORMAL HIGH (ref 11.5–15.5)
WBC: 5.8 10*3/uL (ref 4.0–10.5)

## 2014-06-05 LAB — PREPARE RBC (CROSSMATCH)

## 2014-06-05 LAB — PRO B NATRIURETIC PEPTIDE: Pro B Natriuretic peptide (BNP): 54.5 pg/mL (ref 0–125)

## 2014-06-05 LAB — BASIC METABOLIC PANEL
Anion gap: 14 (ref 5–15)
BUN: 5 mg/dL — AB (ref 6–23)
CHLORIDE: 98 meq/L (ref 96–112)
CO2: 25 meq/L (ref 19–32)
CREATININE: 0.69 mg/dL (ref 0.50–1.10)
Calcium: 9.5 mg/dL (ref 8.4–10.5)
GFR calc Af Amer: >90 mL/min (ref >90)
GFR calc non Af Amer: >90 mL/min (ref >90)
GLUCOSE: 94 mg/dL (ref 70–99)
Potassium: 3.6 mEq/L — ABNORMAL LOW (ref 3.7–5.3)
Sodium: 137 mEq/L (ref 137–147)

## 2014-06-05 LAB — I-STAT TROPONIN, ED: Troponin i, poc: 0 ng/mL (ref 0.00–0.08)

## 2014-06-05 MED ORDER — FENTANYL CITRATE 0.05 MG/ML IJ SOLN
100.0000 ug | Freq: Once | INTRAMUSCULAR | Status: AC
  Administered 2014-06-06: 100 ug via INTRAVENOUS
  Filled 2014-06-05: qty 2

## 2014-06-05 MED ORDER — SODIUM CHLORIDE 0.9 % IV BOLUS (SEPSIS)
1000.0000 mL | Freq: Once | INTRAVENOUS | Status: AC
  Administered 2014-06-06: 1000 mL via INTRAVENOUS

## 2014-06-05 MED ORDER — SODIUM CHLORIDE 0.9 % IV SOLN: 10.0000 mL/h | Freq: Once | INTRAVENOUS | Status: DC

## 2014-06-05 MED ORDER — ONDANSETRON HCL 4 MG/2ML IJ SOLN
4.0000 mg | Freq: Once | INTRAMUSCULAR | Status: AC
  Administered 2014-06-06: 4 mg via INTRAVENOUS
  Filled 2014-06-05: qty 2

## 2014-06-05 NOTE — ED Provider Notes (Addendum)
CSN: [id number]     Arrival date & time 06/05/14  1938 History   First MD Initiated Contact with Patient 06/05/14 2211     Chief Complaint  Patient presents with  . Fatigue  . Chest Pain  . Headache     (Consider location/radiation/quality/duration/timing/severity/associated sxs/prior Treatment) HPI Meredith Byrd ([phone])  She presents to the emergency department with possible history of gastric bypass, chronic anemia with iron deficiency, chronic migraines, chronic chest pains (currently supposed to be wearing heart monitor given to her by Dr. Herbie Baltimore) with complaints of fatigue and syncopal episode today.  The patient had gastric bypass years ago and since then has had a slowly declining hemoglobin. Over the past two months it has gotten significantly worse and she reports her PCP at [facility] told her last week that she may need blood transfusions and or iron transfusions. She was advised that if she started to feel work or had symptoms to go to the ER for emergency transfusion. She has chronic migraines and reports this is more of the same, normal, migraine. She reports being out of her migraine medications because she has headaches so frequently. She reports being cold and having SOB. Nurse reports chest pains, she directly denies this symptoms to me. Tells me that she gets dyspnea on exertion and it has been steadily worsening over the past two weeks.  Past Medical History  Diagnosis Date  . Allergy   . Anemia   . GERD (gastroesophageal reflux disease)   . Hypertension   . Migraines   . Palpitations   . Hx of echocardiogram     The echocadiogram was essentially normal with the exception of mild mitral calcification and borderline concentric LVH, which in the setting of her hypertension at this early age is something that mean her blood pressure is well controlled.   [name] History of stress test 02/2011 ([facility])    there was no evidence of ischemia, but she only went 3 1/2  minutes on the treadmill making it very difficult to get a good accurate assessment, however   Past Surgical History  Procedure Laterality Date  . Cholecystectomy    . Breast surgery    . Gastric bypass  2008  . Oophorectomy Right 2010   Family History  Problem Relation Age of Onset  . Asthma Mother   . Heart disease Mother   . Diabetes Mother   . Early death Mother   . Hypertension Mother   . Stroke Maternal Grandmother   . Heart disease Maternal Grandmother   . Hypertension Maternal Grandmother   . Hyperlipidemia Maternal Grandmother    History  Substance Use Topics  . Smoking status: Never Smoker   . Smokeless tobacco: Never Used  . Alcohol Use: No   OB History   Grav Para Term Preterm Abortions TAB SAB Ect Mult Living                 Review of Systems  Constitutional: Positive for fatigue.  Respiratory: Positive for shortness of breath.   Cardiovascular: Positive for chest pain.  Neurological: Positive for headaches.      Allergies  Review of patient's allergies indicates no known allergies.  Home Medications   Prior to Admission medications   Medication Sig Start Date End Date Taking? Authorizing Provider  alosetron (LOTRONEX) 0.5 MG tablet Take 1 mg by mouth 2 (two) times daily. 03/22/14   Verdis Frederickson Cook, Meredith Byrd  amLODipine (NORVASC) 10 MG tablet Take 1 tablet (10  mg total) by mouth daily. 03/22/14   Meredith Sams, Meredith Byrd  butorphanol (STADOL) 10 MG/ML nasal spray Place 1 spray into the nose every 4 (four) hours as needed for headache. 03/22/14   Meredith Sams, Meredith Byrd  clidinium-chlordiazePOXIDE (LIBRAX) 5-2.5 MG per capsule 2 capsules 4 times daily. 03/22/14   Meredith Sams, Meredith Byrd  Cyanocobalamin (VITAMIN B-12 IJ) Inject as directed every 30 (thirty) days.    Historical Provider, MD  MedroxyPROGESTERone Acetate (DEPO-PROVERA IM) Inject into the muscle every 3 (three) months.    Historical Provider, MD  pregabalin (LYRICA) 100 MG capsule Take 1 capsule (100 mg total) by mouth 3  (three) times daily. 04/12/14   Meredith Sams, Meredith Byrd  traZODone (DESYREL) 50 MG tablet Take 50-100 mg by mouth at bedtime as needed for sleep. 03/22/14   Meredith Sams, Meredith Byrd  Vitamin D, Ergocalciferol, (DRISDOL) 50000 UNITS CAPS capsule Take 50,000 Units by mouth every 7 (seven) days.    Historical Provider, MD   BP 129/42  Pulse 105  Temp(Src) 98 F (36.7 C)  Resp 17  Ht 5' (1.524 m)  Wt 198 lb (89.812 kg)  BMI 38.67 kg/m2  SpO2 100% Physical Exam  Nursing note and vitals reviewed. Constitutional: She is oriented to person, place, and time. She appears well-developed and well-nourished. No distress.  HENT:  Head: Normocephalic and atraumatic.  Eyes: Pupils are equal, round, and reactive to light.  Neck: Normal range of motion. Neck supple.  Cardiovascular: Regular rhythm.  Tachycardia present.   Pulmonary/Chest: Effort normal.  Abdominal: Soft.  Neurological: She is alert and oriented to person, place, and time. She has normal strength. No cranial nerve deficit or sensory deficit. GCS eye subscore is 4. GCS verbal subscore is 5. GCS motor subscore is 6.  Skin: Skin is warm and dry.    ED Course  Procedures (including critical care time) Labs Review Labs Reviewed  CBC - Abnormal; Notable for the following:    Hemoglobin 7.6 (*)    HCT 26.2 (*)    MCV 59.3 (*)    MCH 17.2 (*)    MCHC 29.0 (*)    RDW 21.1 (*)    Platelets 628 (*)    All other components within normal limits  BASIC METABOLIC PANEL - Abnormal; Notable for the following:    Potassium 3.6 (*)    BUN 5 (*)    All other components within normal limits  PRO B NATRIURETIC PEPTIDE  I-STAT TROPOININ, ED  TYPE AND SCREEN  PREPARE RBC (CROSSMATCH)    Imaging Review Dg Chest 2 View  06/05/2014   CLINICAL DATA:  Mid chest pain.  EXAM: CHEST  2 VIEW  COMPARISON:  05/07/2006.  FINDINGS: Normal sized heart. Clear lungs with normal vascularity. Minimal scoliosis and mild thoracic spine degenerative changes. Cholecystectomy  clips.  IMPRESSION: No acute abnormality.   Electronically Signed   By: Gordan Payment M.D.   On: 06/05/2014 20:34     EKG Interpretation None     KAMORIA, LUCIEN WU:[id number] 05-Jun-2014 19:43:09 [facility]/ED ROUTINE RECORD Sinus tachycardia Otherwise normal ECG 58mm/s 33mm/mV  8.0 SP2 12SL 241 HD CID: [id number] Referred by: Unconfirmed Vent. rate 103 BPM PR interval 132 ms QRS duration 76 ms QT/QTc 376/492 ms P-R-T axes 53 77 51 1981-02-19 (32 yr) Female Black Room: Loc:11 Technician: [id number] Test ind:  MDM   Final diagnoses:  S/P gastric bypass  Anemia due to other cause  Near syncope  Migraine without aura  and without status migrainosus, not intractable    The patients hemoglobin is 7.6, two months ago I see a value for 7.4 and this appears to have improved. She however, reports need iron infusions and having low iron. She reports being cold, fatigued, headaches with syncope. Will call for Family Practice to admit for further evaluation and treatment.   Family practice physicians have agreed to admit, will place admission orders.  Patient reports that she no longer see's Family practice Dr. Dorie Rank and is now seeing Dr. Laveda Norman at [facility]. She does not want to be under the residents service. Therefore, triad has been consulted and has agreed to admit. No holding orders placed due to hospitalists being able to see patient very shortly. She will need transfusion and then will most likely  Dc.    Dorthula Matas, PA-C 06/06/14 0036

## 2014-06-05 NOTE — ED Notes (Signed)
Pt here due to fatigue, dizziness, near syncope, cold tingling feet and hands, and headaches. Pt states she has anemia and usually gets an iron or blood transfusion. Pt states she was supposed to get an appt at the cancer center for transfusion but they were booked. Pt rates headache 9/10.

## 2014-06-05 NOTE — ED Notes (Signed)
Pt here with multiple complaints. Pt reports constant central chest pressure with SOB, dizziness, weakness and nasuea. Pt also reports HA x 2 days, states "I feel like it has given me blurred vision." Denies taking anything for pain. Pt in NAD. AO x4. VSS.

## 2014-06-06 DIAGNOSIS — D649 Anemia, unspecified: Secondary | ICD-10-CM

## 2014-06-06 DIAGNOSIS — I10 Essential (primary) hypertension: Secondary | ICD-10-CM

## 2014-06-06 DIAGNOSIS — Z9884 Bariatric surgery status: Secondary | ICD-10-CM

## 2014-06-06 DIAGNOSIS — R5381 Other malaise: Secondary | ICD-10-CM

## 2014-06-06 DIAGNOSIS — R55 Syncope and collapse: Secondary | ICD-10-CM

## 2014-06-06 DIAGNOSIS — R0602 Shortness of breath: Secondary | ICD-10-CM

## 2014-06-06 DIAGNOSIS — R5383 Other fatigue: Secondary | ICD-10-CM

## 2014-06-06 LAB — CBC WITH DIFFERENTIAL/PLATELET
BASOS PCT: 0 % (ref 0–1)
Basophils Absolute: 0 10*3/uL (ref 0.0–0.1)
Eosinophils Absolute: 0 10*3/uL (ref 0.0–0.7)
Eosinophils Relative: 0 % (ref 0–5)
HEMATOCRIT: 28.9 % — AB (ref 36.0–46.0)
HEMOGLOBIN: 8.8 g/dL — AB (ref 12.0–15.0)
Lymphocytes Relative: 32 % (ref 12–46)
Lymphs Abs: 1.8 10*3/uL (ref 0.7–4.0)
MCH: 19 pg — ABNORMAL LOW (ref 26.0–34.0)
MCHC: 30.4 g/dL (ref 30.0–36.0)
MCV: 62.3 fL — ABNORMAL LOW (ref 78.0–100.0)
MONOS PCT: 6 % (ref 3–12)
Monocytes Absolute: 0.3 10*3/uL (ref 0.1–1.0)
NEUTROS ABS: 3.6 10*3/uL (ref 1.7–7.7)
Neutrophils Relative %: 62 % (ref 43–77)
Platelets: 432 10*3/uL — ABNORMAL HIGH (ref 150–400)
RBC: 4.64 MIL/uL (ref 3.87–5.11)
RDW: 23.2 % — ABNORMAL HIGH (ref 11.5–15.5)
WBC: 5.7 10*3/uL (ref 4.0–10.5)

## 2014-06-06 LAB — COMPREHENSIVE METABOLIC PANEL
ALBUMIN: 3.3 g/dL — AB (ref 3.5–5.2)
ALK PHOS: 58 U/L (ref 39–117)
ALT: 11 U/L (ref 0–35)
AST: 17 U/L (ref 0–37)
Anion gap: 13 (ref 5–15)
BUN: 5 mg/dL — ABNORMAL LOW (ref 6–23)
CO2: 23 mEq/L (ref 19–32)
Calcium: 8.8 mg/dL (ref 8.4–10.5)
Chloride: 103 mEq/L (ref 96–112)
Creatinine, Ser: 0.67 mg/dL (ref 0.50–1.10)
GFR calc Af Amer: >90 mL/min (ref >90)
GFR calc non Af Amer: >90 mL/min (ref >90)
Glucose, Bld: 85 mg/dL (ref 70–99)
POTASSIUM: 3.8 meq/L (ref 3.7–5.3)
Sodium: 139 mEq/L (ref 137–147)
Total Bilirubin: 1 mg/dL (ref 0.3–1.2)
Total Protein: 6.9 g/dL (ref 6.0–8.3)

## 2014-06-06 LAB — TSH: TSH: 0.828 u[IU]/mL (ref 0.350–4.500)

## 2014-06-06 LAB — ABO/RH: ABO/RH(D): A POS

## 2014-06-06 MED ORDER — TRAZODONE HCL 50 MG PO TABS
50.0000 mg | ORAL_TABLET | Freq: Every evening | ORAL | Status: DC | PRN
  Filled 2014-06-06: qty 2

## 2014-06-06 MED ORDER — DIPHENHYDRAMINE HCL 25 MG PO CAPS
25.0000 mg | ORAL_CAPSULE | Freq: Four times a day (QID) | ORAL | Status: AC | PRN
  Administered 2014-06-06 – 2014-06-07 (×2): 25 mg via ORAL
  Filled 2014-06-06 (×2): qty 1

## 2014-06-06 MED ORDER — GABAPENTIN 300 MG PO CAPS
300.0000 mg | ORAL_CAPSULE | Freq: Three times a day (TID) | ORAL | Status: DC
  Administered 2014-06-06 – 2014-06-08 (×7): 300 mg via ORAL
  Filled 2014-06-06 (×9): qty 1

## 2014-06-06 MED ORDER — HYDROCODONE-ACETAMINOPHEN 5-325 MG PO TABS
1.0000 | ORAL_TABLET | Freq: Four times a day (QID) | ORAL | Status: DC | PRN
  Administered 2014-06-06 – 2014-06-07 (×6): 2 via ORAL
  Filled 2014-06-06 (×6): qty 2

## 2014-06-06 MED ORDER — HEPARIN SODIUM (PORCINE) 5000 UNIT/ML IJ SOLN
5000.0000 [IU] | Freq: Three times a day (TID) | INTRAMUSCULAR | Status: DC
  Administered 2014-06-06: 5000 [IU] via SUBCUTANEOUS
  Filled 2014-06-06 (×4): qty 1

## 2014-06-06 MED ORDER — SODIUM CHLORIDE 0.9 % IJ SOLN
3.0000 mL | Freq: Two times a day (BID) | INTRAMUSCULAR | Status: DC
  Administered 2014-06-06 – 2014-06-08 (×5): 3 mL via INTRAVENOUS

## 2014-06-06 MED ORDER — SODIUM CHLORIDE 0.9 % IV SOLN
Freq: Once | INTRAVENOUS | Status: AC
  Administered 2014-06-06: 10 mL via INTRAVENOUS

## 2014-06-06 MED ORDER — PANTOPRAZOLE SODIUM 40 MG PO TBEC
40.0000 mg | DELAYED_RELEASE_TABLET | Freq: Every day | ORAL | Status: DC
  Administered 2014-06-06 – 2014-06-08 (×3): 40 mg via ORAL
  Filled 2014-06-06: qty 1

## 2014-06-06 MED ORDER — SODIUM CHLORIDE 0.9 % IV BOLUS (SEPSIS)
1000.0000 mL | Freq: Once | INTRAVENOUS | Status: AC
  Administered 2014-06-06: 1000 mL via INTRAVENOUS

## 2014-06-06 MED ORDER — VITAMIN D (ERGOCALCIFEROL) 1.25 MG (50000 UNIT) PO CAPS: 50000.0000 [IU] | ORAL_CAPSULE | ORAL | Status: DC

## 2014-06-06 NOTE — Progress Notes (Signed)
Patient admitted this AM by Dr. Alvester Morin.  Please see H&P.  Still with fatigue and SOB, dizziness S/p 2 units- check HGb- no labs for Fe before -check TSH PT Eval vestibular  Marlin Canary

## 2014-06-06 NOTE — Progress Notes (Signed)
Attempted to do ortho but patient said she is too tied to get up, will prefer it to be done later. Patient also getting blood transfusion. Will let the incoming nurse know.

## 2014-06-06 NOTE — Progress Notes (Signed)
Patient on first unit of blood transfusion and tolerating well. Site of IV itching . Requested for benadryl. No reaction noted at this time. Will continue to monitor.

## 2014-06-06 NOTE — Evaluation (Addendum)
Physical Therapy Evaluation Patient Details Name: ERIELLE GAWRONSKI MRN: [id number] DOB: 18-Aug-1981 Today's Date: 06/06/2014   History of Present Illness   Patient presents to the emergency department with complaints of fatigue and syncopal episode today.The patient had gastric bypass years ago and since then has had a slowly declining hemoglobin. Over the past two months it has gotten significantly worse and she reports her PCP at [facility] told her last week that she may need blood transfusions and or iron transfusions. She was advised that if she started to feel work or had symptoms to go to the ER for emergency transfusion. She has chronic migraines and reports this is more of the same, normal, migraine. She reports being out of her migraine medications because she has headaches so frequently. She reports being cold and having SOB. Nurse reports chest pains, she directly denies this symptoms to me. Tells me that she gets dyspnea on exertion and it has been steadily worsening over the past two weeks.  Past medical history of gastric bypass, chronic anemia with iron deficiency, chronic migraines, chronic chest pains (currently supposed to be wearing heart monitor given to her by Dr. Herbie Baltimore)     Clinical Impression  Pt admitted with syncope, low Hgb and dizziness. Pt currently with functional limitations due to the deficits listed below (see PT Problem List). Will benefit from Outpt PT for Migraine related dizziness and vestibular rehab. Discussed with pt and husband and they understand.   Pt should progress well.  Pt will benefit from skilled PT to increase their independence and safety with mobility to allow discharge to the venue listed below.     Follow Up Recommendations Outpatient PT;Supervision - Intermittent (for vestibular rehab)    Equipment Recommendations  None recommended by PT    Recommendations for Other Services       Precautions / Restrictions Precautions Precautions:  Fall Restrictions Weight Bearing Restrictions: No      Mobility  Bed Mobility Overal bed mobility: Needs Assistance Bed Mobility: Supine to Sit     Supine to sit: Supervision        Transfers Overall transfer level: Needs assistance Equipment used: None Transfers: Sit to/from Stand Sit to Stand: Min guard         General transfer comment: guard for balance.   Ambulation/Gait Ambulation/Gait assistance: Min guard Ambulation Distance (Feet): 5 Feet Assistive device: None Gait Pattern/deviations: Decreased stride length;Step-through pattern;Wide base of support   Gait velocity interpretation: Below normal speed for age/gender General Gait Details: Did not ambulate far.  Slightly unsteady.  Stairs            Wheelchair Mobility    Modified Rankin (Stroke Patients Only)       Balance Overall balance assessment: Needs assistance         Standing balance support: No upper extremity supported;During functional activity Standing balance-Leahy Scale: Fair Standing balance comment: can stand without UE support.  Did not challenge pt today.                              Pertinent Vitals/Pain Pain Assessment: 0-10 Pain Score: 8  Pain Location: head Pain Descriptors / Indicators: Pounding;Pressure;Throbbing Pain Intervention(s): Patient requesting pain meds-RN notified;Monitored during session;Limited activity within patient's tolerance;Repositioned         Orthostatic BPs  Supine 141/58, 103 bpm  Sitting 119/68, 112 bpm  Standing 135/89, 108 bpm   Home Living Family/patient expects to  be discharged to:: Private residence Living Arrangements: Spouse/significant other Available Help at Discharge: Family;Available 24 hours/day Type of Home: House         Home Equipment: None      Prior Function Level of Independence: Independent               Hand Dominance        Extremity/Trunk Assessment   Upper Extremity Assessment:  Defer to OT evaluation           Lower Extremity Assessment: Generalized weakness      Cervical / Trunk Assessment: Normal  Communication   Communication: No difficulties  Cognition Arousal/Alertness: Awake/alert Behavior During Therapy: Flat affect Overall Cognitive Status: Within Functional Limits for tasks assessed                      General Comments General comments (skin integrity, edema, etc.): 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  Gave handouts re: treatment.  Also discussed that pt could have Migraine related dizziness.  Will test further next treatment.      Exercises        Assessment/Plan    PT Assessment Patient needs continued PT services  PT Diagnosis Generalized weakness;Acute pain   PT Problem List Decreased balance;Decreased activity tolerance;Decreased mobility;Decreased knowledge of use of DME;Decreased safety awareness;Decreased knowledge of precautions;Pain  PT Treatment Interventions DME instruction;Gait training;Functional mobility training;Therapeutic activities;Therapeutic exercise;Balance training;Patient/family education   PT Goals (Current goals can be found in the Care Plan section) Acute Rehab PT Goals Patient Stated Goal: to go home PT Goal Formulation: With patient Time For Goal Achievement: 06/13/14 Potential to Achieve Goals: Good    Frequency Min 3X/week   Barriers to discharge        Co-evaluation               End of Session Equipment Utilized During Treatment: Gait belt Activity Tolerance: Patient limited by fatigue;Patient limited by pain Patient left: in bed;with call bell/phone within reach;with family/visitor present Nurse Communication: Mobility status;Patient requests pain meds         Time: 1238-1310 PT Time Calculation (min): 32 min   Charges:   PT Evaluation $Initial PT Evaluation Tier I: 1 Procedure PT Treatments $Gait Training: 8-22 mins $Canalith Rep Proc: 8-22  mins   PT G Codes:          INGOLD,Tamico Mundo 2014-06-23, 1:42 PM [facility] [phone] (pager)

## 2014-06-06 NOTE — H&P (Signed)
Hospitalist Admission History and Physical  Patient name: Meredith Byrd Medical record number: [id number] Date of birth: [birth year] Age: 33 y.o. Gender: female  Primary Care Provider: Everlene Other, DO  Chief Complaint: anemia, fatigue, weakness, syncope  History of Present Illness:This is a 33 y.o. year old female with significant past medical history of iron deficiency anemia, morbid obesity s/p gastric bypass, HTN, palpitations/chest pain (normal workup followed by Cards) presenting with sympomatic anemia. Pt states that she had progressive weakness, SOB over the course of the day with 1 episode of syncope. Pt states that she has had a lifelong hx/o iron deficiency anemia that worsened s/p gastric bypass 7-8 years ago. Was seen at [facility] 2 months ago with milder sxs. Had an anemia panel done at the time that was indicative of severe iron deficiency anemia. Pt was scheduled to received IV iron, however, pt states that she was unable to have this done. Denies any abnormal vaginal bleeding. On depo-provera. No black or tarry stools.  Has also been followed by cardiology recently for atypical chest pain. Had stress test done that was negative for ischemia, but was only 3 1/2 minutes long 2/2 patient tolerance. 2D ECHO with mild LVH. Pt states that she has follow up with hematology in mid September.   In the ER, hemodynamically stable. Mildly tachycardic into the low 100s. BP 110s-140s. satting 100% on RA. Notable labs hgb 7.6, K 3.6. CXR WNL. Asking for admission because pt needs pRBC transfusion.  Assessment and Plan: Meredith Byrd is a 33 y.o. year old female presenting with anemia, fatigue, weakness, syncope    Active Problems:   Anemia   Symptomatic anemia   Fatigue/Weakness/Anemia -Likely secondary to sympomatic anemia  -transfuse 1 unit pRBCs and reasess -check orthostatics.  -hold BP meds -hold on 2D ECHO as pt has had fairly complete cards w/u within  the last 6 months.  -known hx/o severe iron deficiency anemia based on recent panel. Recheck at pt's request.  -Will need PCP follow up for IV iron setup. Be sure to follow up with Heme outpt as scheduled.   HTN -normotensive currently  -hold home meds pending transfusion  -follow.   Migraine -no active headache currently -cont home regimen  FEN/GI: heart healthy diet  Prophylaxis: sub q heparin  Disposition: pending further evaluation  Code Status:Full Code    Patient Active Problem List   Diagnosis Date Noted  . Symptomatic anemia 06/05/2014  . Chest pain 05/01/2014  . GERD (gastroesophageal reflux disease) 03/22/2014  . S/P gastric bypass 03/22/2014  . Insomnia 03/22/2014  . Migraine 03/22/2014  . HTN (hypertension) 03/22/2014  . Anemia 03/22/2014  . Palpitations 03/22/2014  . Obesity 03/22/2014   Past Medical History: Past Medical History  Diagnosis Date  . Allergy   . Anemia   . GERD (gastroesophageal reflux disease)   . Hypertension   . Migraines   . Palpitations   . Hx of echocardiogram     The echocadiogram was essentially normal with the exception of mild mitral calcification and borderline concentric LVH, which in the setting of her hypertension at this early age is something that mean her blood pressure is well controlled.   [name] History of stress test 02/2011 ([facility])    there was no evidence of ischemia, but she only went 3 1/2 minutes on the treadmill making it very difficult to get a good accurate assessment, however    Past Surgical History: Past Surgical History  Procedure Laterality Date  .  Cholecystectomy    . Breast surgery    . Gastric bypass  2008  . Oophorectomy Right 2010    Social History: History   Social History  . Marital Status: Married    Spouse Name: N/A    Number of Children: N/A  . Years of Education: N/A   Social History Main Topics  . Smoking status: Never Smoker   . Smokeless tobacco: Never Used  . Alcohol Use: No  .  Drug Use: No  . Sexual Activity: Yes   Other Topics Concern  . None   Social History Narrative  . None    Family History: Family History  Problem Relation Age of Onset  . Asthma Mother   . Heart disease Mother   . Diabetes Mother   . Early death Mother   . Hypertension Mother   . Stroke Maternal Grandmother   . Heart disease Maternal Grandmother   . Hypertension Maternal Grandmother   . Hyperlipidemia Maternal Grandmother     Allergies: No Known Allergies  Current Facility-Administered Medications  Medication Dose Route Frequency Provider Last Rate Last Dose  . 0.9 %  sodium chloride infusion  10 mL/hr Intravenous Once Tiffany G Greene, PA-C      . heparin injection 5,000 Units  5,000 Units Subcutaneous 3 times per day Doree Albee, MD      . sodium chloride 0.9 % bolus 1,000 mL  1,000 mL Intravenous Once Dorthula Matas, PA-C 1,000 mL/hr at 06/06/14 0011 1,000 mL at 06/06/14 0011  . sodium chloride 0.9 % injection 3 mL  3 mL Intravenous Q12H Doree Albee, MD       Current Outpatient Prescriptions  Medication Sig Dispense Refill  . alosetron (LOTRONEX) 0.5 MG tablet Take 1 mg by mouth 2 (two) times daily.      [name] amLODipine (NORVASC) 10 MG tablet Take 1 tablet (10 mg total) by mouth daily.  90 tablet  3  . butorphanol (STADOL) 10 MG/ML nasal spray Place 1 spray into the nose every 4 (four) hours as needed for headache.  2.5 mL  0  . clidinium-chlordiazePOXIDE (LIBRAX) 5-2.5 MG per capsule 2 capsules 4 times daily.  60 capsule  3  . Cyanocobalamin (VITAMIN B-12 IJ) Inject as directed every 30 (thirty) days.      . MedroxyPROGESTERone Acetate (DEPO-PROVERA IM) Inject into the muscle every 3 (three) months.      . pregabalin (LYRICA) 100 MG capsule Take 1 capsule (100 mg total) by mouth 3 (three) times daily.  90 capsule  3  . traZODone (DESYREL) 50 MG tablet Take 50-100 mg by mouth at bedtime as needed for sleep.      . Vitamin D, Ergocalciferol, (DRISDOL) 50000 UNITS CAPS  capsule Take 50,000 Units by mouth every 7 (seven) days.       Review Of Systems: 12 point ROS negative except as noted above in HPI.  Physical Exam: Filed Vitals:   06/06/14 0004  BP: 110/63  Pulse:   Temp:   Resp:     General: alert and cooperative HEENT: PERRLA and extra ocular movement intact Heart: S1, S2 normal, no murmur, rub or gallop, regular rate and rhythm Lungs: clear to auscultation, no wheezes or rales and unlabored breathing Abdomen: abdomen is soft without significant tenderness, masses, organomegaly or guarding Extremities: extremities normal, atraumatic, no cyanosis or edema Skin:no rashes, no ecchymoses Neurology: normal without focal findings  Labs and Imaging: Lab Results  Component Value Date/Time   NA 137 06/05/2014  7:51 PM   K 3.6* 06/05/2014  7:51 PM   CL 98 06/05/2014  7:51 PM   CO2 25 06/05/2014  7:51 PM   BUN 5* 06/05/2014  7:51 PM   CREATININE 0.69 06/05/2014  7:51 PM   CREATININE 0.65 03/22/2014 12:17 PM   GLUCOSE 94 06/05/2014  7:51 PM   Lab Results  Component Value Date   WBC 5.8 06/05/2014   HGB 7.6* 06/05/2014   HCT 26.2* 06/05/2014   MCV 59.3* 06/05/2014   PLT 628* 06/05/2014    Dg Chest 2 View  06/05/2014   CLINICAL DATA:  Mid chest pain.  EXAM: CHEST  2 VIEW  COMPARISON:  05/07/2006.  FINDINGS: Normal sized heart. Clear lungs with normal vascularity. Minimal scoliosis and mild thoracic spine degenerative changes. Cholecystectomy clips.  IMPRESSION: No acute abnormality.   Electronically Signed   By: Gordan Payment M.D.   On: 06/05/2014 20:34           Doree Albee MD  Pager: [phone]

## 2014-06-06 NOTE — ED Provider Notes (Signed)
Medical screening examination/treatment/procedure(s) were performed by non-physician practitioner and as supervising physician I was immediately available for consultation/collaboration.  Flint Melter, MD 06/06/14 [phone]

## 2014-06-06 NOTE — ED Provider Notes (Signed)
Medical screening examination/treatment/procedure(s) were performed by non-physician practitioner and as supervising physician I was immediately available for consultation/collaboration.  Flint Melter, MD 06/06/14 [phone]

## 2014-06-06 NOTE — Progress Notes (Signed)
Patient admitted with hgb of 7.6. Blood transfusion ordered. Patient complaining of Migraine . Doctor oncall paged. Vicodin order received and medication administered.

## 2014-06-07 ENCOUNTER — Other Ambulatory Visit: Payer: Self-pay | Admitting: Hematology and Oncology

## 2014-06-07 ENCOUNTER — Telehealth: Payer: Self-pay | Admitting: *Deleted

## 2014-06-07 DIAGNOSIS — D508 Other iron deficiency anemias: Secondary | ICD-10-CM

## 2014-06-07 LAB — COMPREHENSIVE METABOLIC PANEL
ALBUMIN: 3.3 g/dL — AB (ref 3.5–5.2)
ALT: 11 U/L (ref 0–35)
AST: 13 U/L (ref 0–37)
Alkaline Phosphatase: 56 U/L (ref 39–117)
Anion gap: 14 (ref 5–15)
BUN: 8 mg/dL (ref 6–23)
CALCIUM: 8.7 mg/dL (ref 8.4–10.5)
CO2: 23 mEq/L (ref 19–32)
Chloride: 104 mEq/L (ref 96–112)
Creatinine, Ser: 0.6 mg/dL (ref 0.50–1.10)
GFR calc Af Amer: >90 mL/min (ref >90)
Glucose, Bld: 84 mg/dL (ref 70–99)
Potassium: 3.9 mEq/L (ref 3.7–5.3)
Sodium: 141 mEq/L (ref 137–147)
Total Bilirubin: 0.3 mg/dL (ref 0.3–1.2)
Total Protein: 6.6 g/dL (ref 6.0–8.3)

## 2014-06-07 LAB — FERRITIN: Ferritin: 9 ng/mL — ABNORMAL LOW (ref 10–291)

## 2014-06-07 LAB — CBC WITH DIFFERENTIAL/PLATELET
BASOS PCT: 0 % (ref 0–1)
Basophils Absolute: 0 10*3/uL (ref 0.0–0.1)
Basophils Absolute: 0 10*3/uL (ref 0.0–0.1)
Basophils Relative: 0 % (ref 0–1)
EOS PCT: 1 % (ref 0–5)
EOS PCT: 1 % (ref 0–5)
Eosinophils Absolute: 0.1 10*3/uL (ref 0.0–0.7)
Eosinophils Absolute: 0.1 10*3/uL (ref 0.0–0.7)
HCT: 29.6 % — ABNORMAL LOW (ref 36.0–46.0)
HEMATOCRIT: 33 % — AB (ref 36.0–46.0)
HEMOGLOBIN: 10.4 g/dL — AB (ref 12.0–15.0)
Hemoglobin: 8.9 g/dL — ABNORMAL LOW (ref 12.0–15.0)
LYMPHS ABS: 2.2 10*3/uL (ref 0.7–4.0)
LYMPHS PCT: 37 % (ref 12–46)
LYMPHS PCT: 30 % (ref 12–46)
Lymphs Abs: 2.3 10*3/uL (ref 0.7–4.0)
MCH: 19 pg — ABNORMAL LOW (ref 26.0–34.0)
MCH: 20.2 pg — ABNORMAL LOW (ref 26.0–34.0)
MCHC: 30.1 g/dL (ref 30.0–36.0)
MCHC: 31.5 g/dL (ref 30.0–36.0)
MCV: 63.2 fL — AB (ref 78.0–100.0)
MCV: 64.1 fL — ABNORMAL LOW (ref 78.0–100.0)
MONOS PCT: 7 % (ref 3–12)
Monocytes Absolute: 0.4 10*3/uL (ref 0.1–1.0)
Monocytes Absolute: 0.5 10*3/uL (ref 0.1–1.0)
Monocytes Relative: 6 % (ref 3–12)
Neutro Abs: 3.3 10*3/uL (ref 1.7–7.7)
Neutro Abs: 4.6 10*3/uL (ref 1.7–7.7)
Neutrophils Relative %: 56 % (ref 43–77)
Neutrophils Relative %: 62 % (ref 43–77)
Platelets: 452 10*3/uL — ABNORMAL HIGH (ref 150–400)
Platelets: 528 10*3/uL — ABNORMAL HIGH (ref 150–400)
RBC: 4.68 MIL/uL (ref 3.87–5.11)
RBC: 5.15 MIL/uL — ABNORMAL HIGH (ref 3.87–5.11)
RDW: 22.9 % — ABNORMAL HIGH (ref 11.5–15.5)
RDW: 23.6 % — ABNORMAL HIGH (ref 11.5–15.5)
WBC: 5.9 10*3/uL (ref 4.0–10.5)
WBC: 7.5 10*3/uL (ref 4.0–10.5)

## 2014-06-07 LAB — RETICULOCYTES
RBC.: 5.23 MIL/uL — ABNORMAL HIGH (ref 3.87–5.11)
Retic Count, Absolute: 26.2 10*3/uL (ref 19.0–186.0)
Retic Ct Pct: 0.5 % (ref 0.4–3.1)

## 2014-06-07 LAB — PREPARE RBC (CROSSMATCH)

## 2014-06-07 LAB — FOLATE: Folate: 8.4 ng/mL

## 2014-06-07 LAB — IRON AND TIBC
IRON: 70 ug/dL (ref 42–135)
SATURATION RATIOS: 15 % — AB (ref 20–55)
TIBC: 473 ug/dL — AB (ref 250–470)
UIBC: 403 ug/dL — ABNORMAL HIGH (ref 125–400)

## 2014-06-07 LAB — VITAMIN B12: VITAMIN B 12: 315 pg/mL (ref 211–911)

## 2014-06-07 MED ORDER — SODIUM CHLORIDE 0.9 % IV SOLN
Freq: Once | INTRAVENOUS | Status: AC
  Administered 2014-06-07: 10:00:00 via INTRAVENOUS

## 2014-06-07 MED ORDER — SODIUM CHLORIDE 0.9 % IV SOLN
1020.0000 mg | Freq: Once | INTRAVENOUS | Status: AC
  Administered 2014-06-07: 1020 mg via INTRAVENOUS
  Filled 2014-06-07: qty 34

## 2014-06-07 NOTE — Telephone Encounter (Signed)
Pt left VM states currently in hospital after passing out at home.  She is scheduled to see Dr. Bertis Ruddy on 9/14 as a new pt. for iron deficiency anemia.  Pt asks if Dr. Bertis Ruddy can see her in hospital and order IV iron or can she see her sooner in office than 9/14?

## 2014-06-07 NOTE — Progress Notes (Signed)
Came to visit patient on behalf of [employer] program for [employer] employees/dependents with [employer]. Agreeable to post hospital discharge calls post discharge. States her work schedule will limit her ability to meet with a [employer] at the office. Consents obtained however. Confirmed best contact information. Appreciative of visit.  Raiford Noble, MSN- RN,BSN- [facility] [phone]

## 2014-06-07 NOTE — Progress Notes (Signed)
MEDICATION RELATED CONSULT NOTE - INITIAL   Pharmacy Consult for Feraheme Indication: Iron defieciency anemia  No Known Allergies  Patient Measurements: Height: 5' (152.4 cm) Weight: 201 lb 1 oz (91.2 kg) IBW/kg (Calculated) : 45.5 Adjusted Body Weight:   Vital Signs: Temp: 98.3 F (36.8 C) (08/27 1419) Temp src: Oral (08/27 1419) BP: 100/61 mmHg (08/27 1419) Pulse Rate: 101 (08/27 1419) Intake/Output from previous day: 08/26 0701 - 08/27 0700 In: 840 [P.O.:840] Out: 450 [Urine:450] Intake/Output from this shift: Total I/O In: 12.5 [Blood:12.5] Out: -   Labs:  Recent Labs  06/05/14 1951 06/06/14 1145 06/07/14 0359  WBC 5.8 5.7 5.9  HGB 7.6* 8.8* 8.9*  HCT 26.2* 28.9* 29.6*  PLT 628* 432* 452*  CREATININE 0.69 0.67 0.60  ALBUMIN  --  3.3* 3.3*  PROT  --  6.9 6.6  AST  --  17 13  ALT  --  11 11  ALKPHOS  --  58 56  BILITOT  --  1.0 0.3   Estimated Creatinine Clearance: 99.8 ml/min (by C-G formula based on Cr of 0.6).   Microbiology: No results found for this or any previous visit (from the past 720 hour(s)).  Medical History: Past Medical History  Diagnosis Date  . Allergy   . Anemia   . GERD (gastroesophageal reflux disease)   . Hypertension   . Migraines   . Palpitations   . Hx of echocardiogram     The echocadiogram was essentially normal with the exception of mild mitral calcification and borderline concentric LVH, which in the setting of her hypertension at this early age is something that mean her blood pressure is well controlled.   [name] History of stress test 02/2011 ([facility])    there was no evidence of ischemia, but she only went 3 1/2 minutes on the treadmill making it very difficult to get a good accurate assessment, however    Medications:  Prescriptions prior to admission  Medication Sig Dispense Refill  . alosetron (LOTRONEX) 1 MG tablet Take 2 mg by mouth daily.      [name] amLODipine (NORVASC) 10 MG tablet Take 1 tablet (10 mg total) by mouth  daily.  90 tablet  3  . butorphanol (STADOL) 10 MG/ML nasal spray Place 1 spray into the nose every 4 (four) hours as needed for headache.  2.5 mL  0  . clidinium-chlordiazePOXIDE (LIBRAX) 5-2.5 MG per capsule 2 capsules 4 times daily.  60 capsule  3  . Cyanocobalamin (VITAMIN B-12 IJ) Inject 1 Applicatorful as directed every 30 (thirty) days.       [name] gabapentin (NEURONTIN) 300 MG capsule Take 300 mg by mouth 3 (three) times daily.      [name] losartan-hydrochlorothiazide (HYZAAR) 50-12.5 MG per tablet Take 1 tablet by mouth daily.      . MedroxyPROGESTERone Acetate (DEPO-PROVERA IM) Inject 1 Applicatorful into the muscle every 3 (three) months.       [name] omeprazole (PRILOSEC) 40 MG capsule Take 40 mg by mouth daily.      . traZODone (DESYREL) 50 MG tablet Take 50-100 mg by mouth at bedtime as needed for sleep.      . Vitamin D, Ergocalciferol, (DRISDOL) 50000 UNITS CAPS capsule Take 50,000 Units by mouth every 7 (seven) days.       Scheduled:  . sodium chloride  10 mL/hr Intravenous Once  . gabapentin  300 mg Oral TID  . pantoprazole  40 mg Oral Daily  . sodium chloride  3 mL  Intravenous Q12H  . [START ON 06/09/2014] Vitamin D (Ergocalciferol)  50,000 Units Oral Q7 days    Assessment: 33 yo with a hx of chronic iron deficiency anemia. She has been intolerance to PO iron. Plan is to give her IV iron instead and for her to f/u with out hematology. Feraheme has been ordered. We'll give her the  dose instead of the  just in case she can't stay here for the for the second  dose.   Plan:   Feraheme  IV x1

## 2014-06-07 NOTE — Progress Notes (Addendum)
Physical Therapy Treatment and D/C Patient Details Name: Meredith Byrd MRN: [id number] DOB: [birth year] Today's Date: 06/07/2014    History of Present Illness   Patient presents to the emergency department with complaints of fatigue and syncopal episode today.The patient had gastric bypass years ago and since then has had a slowly declining hemoglobin. Over the past two months it has gotten significantly worse and she reports her PCP at [facility] told her last week that she may need blood transfusions and or iron transfusions. She was advised that if she started to feel work or had symptoms to go to the ER for emergency transfusion. She has chronic migraines and reports this is more of the same, normal, migraine. She reports being out of her migraine medications because she has headaches so frequently. She reports being cold and having SOB. Nurse reports chest pains, she directly denies this symptoms to me. Tells me that she gets dyspnea on exertion and it has been steadily worsening over the past two weeks. Past medical history of gastric bypass, chronic anemia with iron deficiency, chronic migraines, chronic chest pains (currently supposed to be wearing heart monitor given to her by Dr. Ellyn Hack)        PT Comments    Pt admitted with above. Pt currently without functional limitations and can ambulate with independence.  SUggested that pt may want to get cane to use at home initially due to lightheadedness until she gains her strength back.  Also recommend Outpt PT for vestibular rehab for Migraine related dizziness as they can give her a program to perform for this type of dizziness.  Pt agrees.  Pt no longer needs in hospital skilled PT.  D/C PT.    Follow Up Recommendations  Outpatient PT;Supervision - Intermittent (for vestibular rehab)     Equipment Recommendations  Other (comment) (suggested pt may want cane for stability initially )    Recommendations for Other Services        Precautions / Restrictions Precautions Precautions: Fall Restrictions Weight Bearing Restrictions: No    Mobility  Bed Mobility Overal bed mobility: Needs Assistance Bed Mobility: Supine to Sit     Supine to sit: Independent        Transfers Overall transfer level: Needs assistance Equipment used: None Transfers: Sit to/from Stand Sit to Stand: Independent            Ambulation/Gait Ambulation/Gait assistance: Independent Ambulation Distance (Feet): 100 Feet Assistive device: None Gait Pattern/deviations: Decreased stride length;Step-through pattern   Gait velocity interpretation: Below normal speed for age/gender General Gait Details: Pt with very guarded gait but safe overall.  No LOB with activity.     Stairs            Wheelchair Mobility    Modified Rankin (Stroke Patients Only)       Balance Overall balance assessment: Needs assistance;History of Falls         Standing balance support: No upper extremity supported;During functional activity Standing balance-Leahy Scale: Fair Standing balance comment: can stand without UE support with min challenges to balance.  Functions well in controlled environment.  Informed pt that she probably should use a cane at home for stability if she continues to feel lightheaded.               High level balance activites: Turns;Direction changes;Sudden stops;Head turns High Level Balance Comments: Pt ambulates with independence with high level activities.     Cognition Arousal/Alertness: Awake/alert Behavior During Therapy: Flat affect  Overall Cognitive Status: Within Functional Limits for tasks assessed                      Exercises      General Comments General comments (skin integrity, edema, etc.): Pt reports that her "spinning" dizzy has resolved from treatment yesterday.        Pertinent Vitals/Pain Pain Assessment: 0-10 Pain Score: 8  Pain Location: head Pain Descriptors  / Indicators: Pressure Pain Intervention(s): Monitored during session;Repositioned VSS    Home Living                      Prior Function            PT Goals (current goals can now be found in the care plan section) Progress towards PT goals: Goals met/education completed, patient discharged from PT    Frequency  Min 3X/week    PT Plan Current plan remains appropriate    Co-evaluation             End of Session Equipment Utilized During Treatment: Gait belt Activity Tolerance: Patient limited by fatigue Patient left: in bed;with call bell/phone within reach     Time: 1007-1020 PT Time Calculation (min): 13 min  Charges:  $Gait Training: 8-22 mins                    G Codes:      INGOLD,Marquest Gunkel 06/30/14, 11:16 AM [facility] [phone] (pager)

## 2014-06-07 NOTE — Telephone Encounter (Signed)
I spoke with the hospitalist. I will add lab appt prior to seeing her on 9/14.

## 2014-06-07 NOTE — Progress Notes (Addendum)
PROGRESS NOTE    Meredith RINKS ZOX:[id number] DOB: [birth year] DOA: 06/05/2014 PCP: Everlene Other, DO  HPI/Brief narrative 33 y.o. year old female with significant past medical history of iron deficiency anemia, morbid obesity s/p gastric bypass, HTN, palpitations/chest pain (normal workup followed by Cards) presenting with sympomatic anemia. Pt states that she had progressive weakness, SOB over the course of the day with 1 episode of syncope. Pt states that she has had a lifelong hx/o iron deficiency anemia that worsened s/p gastric bypass 7-8 years ago. Was seen at [facility] 2 months ago with milder sxs. Had an anemia panel done at the time that was indicative of severe iron deficiency anemia. Pt was scheduled to received IV iron, however, pt states that she was unable to have this done. Denies any abnormal vaginal bleeding. On depo-provera. No black or tarry stools.  Has also been followed by cardiology recently for atypical chest pain. Had stress test done that was negative for ischemia, but was only 3 1/2 minutes long 2/2 patient tolerance. 2D ECHO with mild LVH. Pt states that she has follow up with hematology in mid September.  In the ER, hemodynamically stable. Mildly tachycardic into the low 100s. BP 110s-140s. satting 100% on RA. Notable labs hgb 7.6, K 3.6. CXR WNL. Asking for admission because pt needs pRBC transfusion   Assessment/Plan:  1. Symptomatic iron deficiency anemia/fatigue/weakness: s/p 1 PRBC and hemoglobin improved from 7.6 > 8.9 but patient states that her symptoms have not significantly changed. We'll transfuse additional 1 unit PRBC. Check anemia panel and consider IV iron. Patient has appointment to see hematology made next month. Patient intolerant to oral iron supplements. Patient does complain of some dizziness made worse by head movement which is not consistent with anemia etiology. If symptoms persist despite correcting anemia, consider other  etiology. Recently completed cardiac workup. Discussed with Dr. Bertis Ruddy who advised IV Iron and OP FU with her in 2 weeks (has appointment). 2. Hypertension: Controlled. 3. History of migraine: No headaches currently. 4. S/p Gastric Bypass:    Code Status: Full Family Communication: None at bedside. Disposition Plan: Home when medically stable.   Consultants:  None  Procedures:  None  Antibiotics:  None    Subjective: Continues to have easy fatigue, weakness & dizziness.  Objective: Filed Vitals:   06/07/14 1114 06/07/14 1217 06/07/14 1323 06/07/14 1419  BP: 101/64 106/73 112/53 100/61  Pulse: 67  93 101  Temp: 98.2 F (36.8 C) 98.9 F (37.2 C) 97.7 F (36.5 C) 98.3 F (36.8 C)  TempSrc: Oral Oral Oral Oral  Resp: Height:      Weight:      SpO2:        Intake/Output Summary (Last 24 hours) at 06/07/14 1616 Last data filed at 06/07/14 1054  Gross per 24 hour  Intake  372.5 ml  Output      0 ml  Net  372.5 ml   Filed Weights   06/05/14 1948 06/06/14 0100  Weight: 89.812 kg (198 lb) 91.2 kg (201 lb 1 oz)     Exam:  General exam: Pleasant young female lying comfortably in bed.  Respiratory system: Clear. No increased work of breathing. Cardiovascular system: S1 & S2 heard, RRR. No JVD, murmurs, gallops, clicks or pedal edema. Telemetry: Sinus rhythm Gastrointestinal system: Abdomen is nondistended, soft and nontender. Normal bowel sounds heard. Central nervous system: Alert and oriented. No focal neurological deficits. Extremities: Symmetric 5 x 5  power.   Data Reviewed: Basic Metabolic Panel:  Recent Labs Lab 06/05/14 1951 06/06/14 1145 06/07/14 0359  NA 137 139 141  K 3.6* 3.8 3.9  CL 98 103 104  CO2 GLUCOSE 94 85 84  BUN 5* 5* 8  CREATININE 0.69 0.67 0.60  CALCIUM 9.5 8.8 8.7   Liver Function Tests:  Recent Labs Lab 06/06/14 1145 06/07/14 0359  AST 17 13  ALT 11 11  ALKPHOS 58 56  BILITOT 1.0 0.3  PROT  6.9 6.6  ALBUMIN 3.3* 3.3*   No results found for this basename: LIPASE, AMYLASE,  in the last 168 hours No results found for this basename: AMMONIA,  in the last 168 hours CBC:  Recent Labs Lab 06/05/14 1951 06/06/14 1145 06/07/14 0359  WBC 5.8 5.7 5.9  NEUTROABS  --  3.6 3.3  HGB 7.6* 8.8* 8.9*  HCT 26.2* 28.9* 29.6*  MCV 59.3* 62.3* 63.2*  PLT 628* 432* 452*   Cardiac Enzymes: No results found for this basename: CKTOTAL, CKMB, CKMBINDEX, TROPONINI,  in the last 168 hours BNP (last 3 results)  Recent Labs  06/05/14 1951  PROBNP 54.5   CBG: No results found for this basename: GLUCAP,  in the last 168 hours  No results found for this or any previous visit (from the past 240 hour(s)).    Studies: Dg Chest 2 View  06/05/2014   CLINICAL DATA:  Mid chest pain.  EXAM: CHEST  2 VIEW  COMPARISON:  05/07/2006.  FINDINGS: Normal sized heart. Clear lungs with normal vascularity. Minimal scoliosis and mild thoracic spine degenerative changes. Cholecystectomy clips.  IMPRESSION: No acute abnormality.   Electronically Signed   By: Gordan Payment M.D.   On: 06/05/2014 20:34        Scheduled Meds: . sodium chloride  10 mL/hr Intravenous Once  . gabapentin  300 mg Oral TID  . pantoprazole  40 mg Oral Daily  . sodium chloride  3 mL Intravenous Q12H  . [START ON 06/09/2014] Vitamin D (Ergocalciferol)  50,000 Units Oral Q7 days   Continuous Infusions:   Active Problems:   Anemia   Symptomatic anemia    Time spent: 35 minutes.    Marcellus Scott, MD, FACP, FHM. [facility] Pager [phone]  If 7PM-7AM, please contact night-coverage [email or web address] Password [email or web address] 06/07/2014, 4:16 PM    LOS: 2 days

## 2014-06-08 ENCOUNTER — Telehealth: Payer: Self-pay | Admitting: *Deleted

## 2014-06-08 DIAGNOSIS — D6489 Other specified anemias: Secondary | ICD-10-CM

## 2014-06-08 LAB — CBC
HCT: 30.8 % — ABNORMAL LOW (ref 36.0–46.0)
Hemoglobin: 9.7 g/dL — ABNORMAL LOW (ref 12.0–15.0)
MCH: 20.1 pg — AB (ref 26.0–34.0)
MCHC: 31.5 g/dL (ref 30.0–36.0)
MCV: 63.8 fL — AB (ref 78.0–100.0)
Platelets: 407 10*3/uL — ABNORMAL HIGH (ref 150–400)
RBC: 4.83 MIL/uL (ref 3.87–5.11)
RDW: 23.7 % — ABNORMAL HIGH (ref 11.5–15.5)
WBC: 5.6 10*3/uL (ref 4.0–10.5)

## 2014-06-08 LAB — TYPE AND SCREEN
ABO/RH(D): A POS
Antibody Screen: NEGATIVE
UNIT DIVISION: 0
Unit division: 0
Unit division: 0

## 2014-06-08 NOTE — Progress Notes (Signed)
To whom it may concern:   Meredith Byrd is unable to work 06/05/14 through 06/08/14 due to illness.     Sincerely,     Crista Curb, MD [facility]

## 2014-06-08 NOTE — Care Management Note (Signed)
    Page 1 of 1   06/08/2014     1:51:53 PM CARE MANAGEMENT NOTE 06/08/2014  Patient:  Meredith Byrd, Meredith Byrd   Account Number:  [id number]  Date Initiated:  06/08/2014  Documentation initiated by:  Yaroslav Gombos  Subjective/Objective Assessment:   Pt adm on 06/05/14 with anemia.  PTA, pt independent, lives with spouse.     Action/Plan:   OP PT recommended by physical therapist.  Pt states she is not interested in OP PT.  MD made aware.   Anticipated DC Date:  06/08/2014   Anticipated DC Plan:  HOME/SELF CARE      DC Planning Services  CM consult      Choice offered to / List presented to:             Status of service:  Completed, signed off Medicare Important Message given?   (If response is "NO", the following Medicare IM given date fields will be blank) Date Medicare IM given:   Medicare IM given by:   Date Additional Medicare IM given:   Additional Medicare IM given by:    Discharge Disposition:  HOME/SELF CARE  Per UR Regulation:  Reviewed for med. necessity/level of care/duration of stay  If discussed at Long Length of Stay Meetings, dates discussed:    Comments:

## 2014-06-08 NOTE — Telephone Encounter (Signed)
Opened in error

## 2014-06-08 NOTE — Discharge Summary (Signed)
Physician Discharge Summary  Meredith Byrd ZOX:[id number] DOB: 20-Apr-1981 DOA: 06/05/2014  PCP: Egbert Garibaldi, NP  Admit date: 06/05/2014 Discharge date: 06/08/2014  Time spent: greater than 30 minutes  Recommendations for Outpatient Follow-up:  1. Monitor h/h 2. IV iron per hematology  Discharge Diagnoses:  Symptomatic iron deficiency anemia Obesity HTN  Discharge Condition: stable  Filed Weights   06/05/14 1948 06/06/14 0100  Weight: 89.812 kg (198 lb) 91.2 kg (201 lb 1 oz)    History of present illness: significant past medical history of iron deficiency anemia, morbid obesity s/p gastric bypass, HTN, palpitations/chest pain (normal workup followed by Cards) presenting with sympomatic anemia. Pt states that she had progressive weakness, SOB over the course of the day with 1 episode of syncope. Pt states that she has had a lifelong hx/o iron deficiency anemia that worsened s/p gastric bypass 7-8 years ago. Was seen at [facility] 2 months ago with milder sxs. Had an anemia panel done at the time that was indicative of severe iron deficiency anemia. Pt was scheduled to received IV iron, however, pt states that she was unable to have this done. Denies any abnormal vaginal bleeding. On depo-provera. No black or tarry stools.  Has also been followed by cardiology recently for atypical chest pain. Had stress test done that was negative for ischemia, but was only 3 1/2 minutes long 2/2 patient tolerance. 2D ECHO with mild LVH. Pt states that she has follow up with hematology in mid September.  In the ER, hemodynamically stable. Mildly tachycardic into the low 100s. BP 110s-140s. satting 100% on RA. Notable labs hgb 7.6, K 3.6. CXR WNL. Asking for admission because pt needs pRBC transfusion.    Hospital Course:  Admitted for transfusion. Received 2 units pRBC.  F/u arranged with hematology.  Antihypertensives held. Remains normotensive, so have asked patient to hold.  Reports intolerance to oral iron  Procedures:  Transfusion pRBC  Consultations:  none  Discharge Exam: Filed Vitals:   06/08/14 0623  BP:   Pulse:   Temp: 98.3 F (36.8 C)  Resp: 17    General: alert, oriented Cardiovascular: RRR Respiratory: CTA   Discharge Instructions   Diet - low sodium heart healthy    Complete by:  As directed      Discharge instructions    Complete by:  As directed   Hold losartan/HCT until Monday, then resume     Increase activity slowly    Complete by:  As directed             Medication List    STOP taking these medications       amLODipine 10 MG tablet  Commonly known as:  NORVASC     butorphanol 10 MG/ML nasal spray  Commonly known as:  STADOL     losartan-hydrochlorothiazide 50-12.5 MG per tablet  Commonly known as:  HYZAAR      TAKE these medications       alosetron 1 MG tablet  Commonly known as:  LOTRONEX  Take 2 mg by mouth daily.     clidinium-chlordiazePOXIDE 5-2.5 MG per capsule  Commonly known as:  LIBRAX  2 capsules 4 times daily.     DEPO-PROVERA IM  Inject 1 Applicatorful into the muscle every 3 (three) months.     gabapentin 300 MG capsule  Commonly known as:  NEURONTIN  Take 300 mg by mouth 3 (three) times daily.     omeprazole 40 MG capsule  Commonly known as:  PRILOSEC  Take 40 mg by mouth daily.     traZODone 50 MG tablet  Commonly known as:  DESYREL  Take 50-100 mg by mouth at bedtime as needed for sleep.     VITAMIN B-12 IJ  Inject 1 Applicatorful as directed every 30 (thirty) days.     Vitamin D (Ergocalciferol) 50000 UNITS Caps capsule  Commonly known as:  DRISDOL  Take 50,000 Units by mouth every 7 (seven) days.       No Known Allergies     Follow-up Information   Follow up with [facility], NI, MD On 06/25/2014.   Specialty:  Hematology and Oncology   Contact information:   [address] [place] [address] [phone]       Follow up with Egbert Garibaldi, NP.  (If symptoms worsen)    Contact information:   [facility] [address] [place] [address] [phone]        The results of significant diagnostics from this hospitalization (including imaging, microbiology, ancillary and laboratory) are listed below for reference.    Significant Diagnostic Studies: Dg Chest 2 View  06/05/2014   CLINICAL DATA:  Mid chest pain.  EXAM: CHEST  2 VIEW  COMPARISON:  05/07/2006.  FINDINGS: Normal sized heart. Clear lungs with normal vascularity. Minimal scoliosis and mild thoracic spine degenerative changes. Cholecystectomy clips.  IMPRESSION: No acute abnormality.   Electronically Signed   By: Gordan Payment M.D.   On: 06/05/2014 20:34    Microbiology: No results found for this or any previous visit (from the past 240 hour(s)).   Labs: Basic Metabolic Panel:  Recent Labs Lab 06/05/14 1951 06/06/14 1145 06/07/14 0359  NA 137 139 141  K 3.6* 3.8 3.9  CL 98 103 104  CO2 GLUCOSE 94 85 84  BUN 5* 5* 8  CREATININE 0.69 0.67 0.60  CALCIUM 9.5 8.8 8.7   Liver Function Tests:  Recent Labs Lab 06/06/14 1145 06/07/14 0359  AST 17 13  ALT 11 11  ALKPHOS 58 56  BILITOT 1.0 0.3  PROT 6.9 6.6  ALBUMIN 3.3* 3.3*   No results found for this basename: LIPASE, AMYLASE,  in the last 168 hours No results found for this basename: AMMONIA,  in the last 168 hours CBC:  Recent Labs Lab 06/05/14 1951 06/06/14 1145 06/07/14 0359 06/07/14 1655 06/08/14 0500  WBC 5.8 5.7 5.9 7.5 5.6  NEUTROABS  --  3.6 3.3 4.6  --   HGB 7.6* 8.8* 8.9* 10.4* 9.7*  HCT 26.2* 28.9* 29.6* 33.0* 30.8*  MCV 59.3* 62.3* 63.2* 64.1* 63.8*  PLT 628* 432* 452* 528* 407*   Cardiac Enzymes: No results found for this basename: CKTOTAL, CKMB, CKMBINDEX, TROPONINI,  in the last 168 hours BNP: BNP (last 3 results)  Recent Labs  06/05/14 1951  PROBNP 54.5   CBG: No results found for this basename: GLUCAP,  in the last 168  hours  Iron/TIBC/Ferritin/ %Sat    Component Value Date/Time   IRON 70 06/07/2014 1655   TIBC 473* 06/07/2014 1655   FERRITIN 9* 06/07/2014 1655   IRONPCTSAT 15* 06/07/2014 1655   IRONPCTSAT 2* 03/22/2014 1217     Signed:  Crista Curb L  [facility] 06/08/2014, 12:32 PM

## 2014-06-19 ENCOUNTER — Other Ambulatory Visit: Payer: Self-pay | Admitting: Family Medicine

## 2014-06-19 ENCOUNTER — Other Ambulatory Visit: Payer: 59

## 2014-06-19 ENCOUNTER — Ambulatory Visit: Payer: 59 | Admitting: Hematology and Oncology

## 2014-06-19 ENCOUNTER — Ambulatory Visit: Payer: 59

## 2014-06-25 ENCOUNTER — Ambulatory Visit: Payer: 59

## 2014-06-25 ENCOUNTER — Ambulatory Visit: Payer: 59 | Admitting: Hematology and Oncology

## 2014-06-25 ENCOUNTER — Other Ambulatory Visit: Payer: 59

## 2014-07-05 ENCOUNTER — Ambulatory Visit: Payer: 59 | Admitting: Internal Medicine

## 2014-07-05 ENCOUNTER — Encounter (HOSPITAL_COMMUNITY): Payer: 59

## 2014-07-09 ENCOUNTER — Ambulatory Visit (HOSPITAL_BASED_OUTPATIENT_CLINIC_OR_DEPARTMENT_OTHER): Payer: 59 | Admitting: Hematology and Oncology

## 2014-07-09 ENCOUNTER — Other Ambulatory Visit (HOSPITAL_BASED_OUTPATIENT_CLINIC_OR_DEPARTMENT_OTHER): Payer: 59

## 2014-07-09 ENCOUNTER — Encounter: Payer: Self-pay | Admitting: Hematology and Oncology

## 2014-07-09 ENCOUNTER — Ambulatory Visit: Payer: 59

## 2014-07-09 ENCOUNTER — Telehealth: Payer: Self-pay | Admitting: *Deleted

## 2014-07-09 VITALS — BP 110/62 | HR 92 | Temp 98.5°F | Ht 60.0 in | Wt 205.0 lb

## 2014-07-09 DIAGNOSIS — D509 Iron deficiency anemia, unspecified: Secondary | ICD-10-CM

## 2014-07-09 DIAGNOSIS — Z9884 Bariatric surgery status: Secondary | ICD-10-CM

## 2014-07-09 DIAGNOSIS — D508 Other iron deficiency anemias: Secondary | ICD-10-CM

## 2014-07-09 HISTORY — DX: Iron deficiency anemia, unspecified: D50.9

## 2014-07-09 LAB — CBC & DIFF AND RETIC
BASO%: 0.4 % (ref 0.0–2.0)
BASOS ABS: 0 10*3/uL (ref 0.0–0.1)
EOS ABS: 0.1 10*3/uL (ref 0.0–0.5)
EOS%: 1 % (ref 0.0–7.0)
HCT: 31 % — ABNORMAL LOW (ref 34.8–46.6)
HEMOGLOBIN: 9.8 g/dL — AB (ref 11.6–15.9)
IMMATURE RETIC FRACT: 18.3 % — AB (ref 1.60–10.00)
LYMPH#: 1.6 10*3/uL (ref 0.9–3.3)
LYMPH%: 19.2 % (ref 14.0–49.7)
MCH: 22 pg — ABNORMAL LOW (ref 25.1–34.0)
MCHC: 31.5 g/dL (ref 31.5–36.0)
MCV: 69.8 fL — AB (ref 79.5–101.0)
MONO#: 0.3 10*3/uL (ref 0.1–0.9)
MONO%: 3.2 % (ref 0.0–14.0)
NEUT%: 76.2 % (ref 38.4–76.8)
NEUTROS ABS: 6.1 10*3/uL (ref 1.5–6.5)
Platelets: 362 10*3/uL (ref 145–400)
RBC: 4.45 10*6/uL (ref 3.70–5.45)
RDW: 28.9 % — AB (ref 11.2–14.5)
RETIC CT ABS: 39.61 10*3/uL (ref 33.70–90.70)
Retic %: 0.89 % (ref 0.70–2.10)
WBC: 8.1 10*3/uL (ref 3.9–10.3)
nRBC: 0 % (ref 0–0)

## 2014-07-09 LAB — FERRITIN CHCC: Ferritin: 390 ng/ml — ABNORMAL HIGH (ref 9–269)

## 2014-07-09 NOTE — Assessment & Plan Note (Signed)
She remains at risk of multiple mineral deficiencies. She will continue vitamin B12 injection on a monthly basis.

## 2014-07-09 NOTE — Telephone Encounter (Signed)
Pt notified of no more iron, will recheck labs in 6 weeks.

## 2014-07-09 NOTE — Assessment & Plan Note (Signed)
This patient has severe iron deficiency anemia requiring iron infusion. However, despite adequate iron replacement, she remains anemic. I suspect that might be a component of anemia chronic disease or possibly undiagnosed thalassemia. I recommend a recheck blood work again in 6 weeks.

## 2014-07-09 NOTE — Progress Notes (Signed)
[place] NOTE  Patient Care Team: Everardo Beals, NP as PCP - General Heath Lark, MD as Consulting Physician (Hematology and Oncology)  CHIEF COMPLAINTS/PURPOSE OF CONSULTATION:  Severe iron deficiency anemia with background history of gastric bypass surgery  HISTORY OF PRESENTING ILLNESS:  Meredith Byrd 33 y.o. female is here because of recent hospital admission for severe anemia. She had history of gastric bypass surgery in 2008. Recently, she complained of profound fatigue, chest pain, shortness of breath and she passed out. She was hospitalized from 06/05/2014 2028 2015. She received 2 units of blood transfusion and intravenous iron infusion. She denies recent chest pain on exertion, but still has persistent shortness of breath on minimal exertion, pre-syncopal episodes, and palpitations. She had not noticed any recent bleeding such as epistaxis, hematuria or hematochezia The patient denies over the counter NSAID ingestion. She is not on antiplatelets agents.  She had no prior history or diagnosis of cancer. Her age appropriate screening programs are up-to-date. She denies any pica and eats a variety of diet. She never donated blood. The patient was prescribed oral iron supplements but she could not tolerate oral iron.   MEDICAL HISTORY:  Past Medical History  Diagnosis Date  . Allergy   . Anemia   . GERD (gastroesophageal reflux disease)   . Hypertension   . Migraines   . Palpitations   . Hx of echocardiogram     The echocadiogram was essentially normal with the exception of mild mitral calcification and borderline concentric LVH, which in the setting of her hypertension at this early age is something that mean her blood pressure is well controlled.   [name] History of stress test 02/2011 (GXT)    there was no evidence of ischemia, but she only went 3 1/2 minutes on the treadmill making it very difficult to get a good accurate assessment, however   . Iron deficiency anemia, unspecified 07/09/2014    SURGICAL HISTORY: Past Surgical History  Procedure Laterality Date  . Cholecystectomy    . Breast surgery    . Gastric bypass  2008  . Oophorectomy Right 2010    SOCIAL HISTORY: History   Social History  . Marital Status: Married    Spouse Name: N/A    Number of Children: N/A  . Years of Education: N/A   Occupational History  . Not on file.   Social History Main Topics  . Smoking status: Never Smoker   . Smokeless tobacco: Never Used  . Alcohol Use: No  . Drug Use: No  . Sexual Activity: Yes   Other Topics Concern  . Not on file   Social History Narrative  . No narrative on file    FAMILY HISTORY: Family History  Problem Relation Age of Onset  . Asthma Mother   . Heart disease Mother   . Diabetes Mother   . Early death Mother   . Hypertension Mother   . Stroke Maternal Grandmother   . Heart disease Maternal Grandmother   . Hypertension Maternal Grandmother   . Hyperlipidemia Maternal Grandmother     ALLERGIES:  has No Known Allergies.  MEDICATIONS:  Current Outpatient Prescriptions  Medication Sig Dispense Refill  . alosetron (LOTRONEX) 1 MG tablet Take 2 mg by mouth daily.      . butorphanol (STADOL) 10 MG/ML nasal spray Place 1 spray into the nose every 4 (four) hours as needed for headache.      . clidinium-chlordiazePOXIDE (LIBRAX) 5-2.5 MG per capsule 2  capsules 4 times daily.  60 capsule  3  . Cyanocobalamin (VITAMIN B-12 IJ) Inject 1 Applicatorful as directed every 30 (thirty) days.       [name] gabapentin (NEURONTIN) 300 MG capsule Take 300 mg by mouth 3 (three) times daily.      [name] losartan-hydrochlorothiazide (HYZAAR) 50-12.5 MG per tablet Take 1 tablet by mouth daily.      . MedroxyPROGESTERone Acetate (DEPO-PROVERA IM) Inject 1 Applicatorful into the muscle every 3 (three) months.       [name] omeprazole (PRILOSEC) 40 MG capsule Take 40 mg by mouth daily.      . traZODone (DESYREL) 50 MG tablet Take  50-100 mg by mouth at bedtime as needed for sleep.      . Vitamin D, Ergocalciferol, (DRISDOL) 50000 UNITS CAPS capsule Take 50,000 Units by mouth every 7 (seven) days.       No current facility-administered medications for this visit.    REVIEW OF SYSTEMS:   Constitutional: Denies fevers, chills or abnormal night sweats Eyes: Denies blurriness of vision, double vision or watery eyes Ears, nose, mouth, throat, and face: Denies mucositis or sore throat Cardiovascular: Denies palpitation, chest discomfort or lower extremity swelling Gastrointestinal:  Denies nausea, heartburn or change in bowel habits Skin: Denies abnormal skin rashes Lymphatics: Denies new lymphadenopathy or easy bruising Neurological:Denies numbness, tingling or new weaknesses Behavioral/Psych: Mood is stable, no new changes  All other systems were reviewed with the patient and are negative.  PHYSICAL EXAMINATION: ECOG PERFORMANCE STATUS: 1 - Symptomatic but completely ambulatory  Filed Vitals:   07/09/14 0913  BP: 110/62  Pulse: 92  Temp: 98.5 F (36.9 C)   Filed Weights   07/09/14 0913  Weight: 205 lb (92.987 kg)    GENERAL:alert, no distress and comfortable. She is morbidly obese SKIN: skin color, texture, turgor are normal, no rashes or significant lesions EYES: normal, conjunctiva are pale and non-injected, sclera clear OROPHARYNX:no exudate, no erythema and lips, buccal mucosa, and tongue normal  NECK: supple, thyroid normal size, non-tender, without nodularity LYMPH:  no palpable lymphadenopathy in the cervical, axillary or inguinal LUNGS: clear to auscultation and percussion with normal breathing effort HEART: regular rate & rhythm and no murmurs and no lower extremity edema ABDOMEN:abdomen soft, non-tender and normal bowel sounds Musculoskeletal:no cyanosis of digits and no clubbing  PSYCH: alert & oriented x 3 with fluent speech NEURO: no focal motor/sensory deficits  LABORATORY DATA:  I have  reviewed the data as listed Recent Results (from the past 2160 hour(s))  CBC     Status: Abnormal   Collection Time    06/05/14  7:51 PM      Result Value Ref Range   WBC 5.8  4.0 - 10.5 K/uL   RBC 4.42  3.87 - 5.11 MIL/uL   Hemoglobin 7.6 (*) 12.0 - 15.0 g/dL   HCT 26.2 (*) 36.0 - 46.0 %   MCV 59.3 (*) 78.0 - 100.0 fL   MCH 17.2 (*) 26.0 - 34.0 pg   MCHC 29.0 (*) 30.0 - 36.0 g/dL   RDW 21.1 (*) 11.5 - 15.5 %   Platelets 628 (*) 150 - 400 K/uL   Comment: PLATELET COUNT CONFIRMED BY SMEAR     REPEATED TO VERIFY  BASIC METABOLIC PANEL     Status: Abnormal   Collection Time    06/05/14  7:51 PM      Result Value Ref Range   Sodium 137  137 - 147 mEq/L   Potassium 3.6 (*)  3.7 - 5.3 mEq/L   Chloride 98  96 - 112 mEq/L   CO2 25  19 - 32 mEq/L   Glucose, Bld 94  70 - 99 mg/dL   BUN 5 (*) 6 - 23 mg/dL   Creatinine, Ser 0.69  0.50 - 1.10 mg/dL   Calcium 9.5  8.4 - 10.5 mg/dL   GFR calc non Af Amer >90  >90 mL/min   GFR calc Af Amer >90  >90 mL/min   Comment: (NOTE)     The eGFR has been calculated using the CKD EPI equation.     This calculation has not been validated in all clinical situations.     eGFR's persistently <90 mL/min signify possible Chronic Kidney     Disease.   Anion gap 14  5 - 15  PRO B NATRIURETIC PEPTIDE     Status: None   Collection Time    06/05/14  7:51 PM      Result Value Ref Range   Pro B Natriuretic peptide (BNP) 54.5  0 - 125 pg/mL  I-STAT TROPOININ, ED     Status: None   Collection Time    06/05/14  7:57 PM      Result Value Ref Range   Troponin i, poc 0.00  0.00 - 0.08 ng/mL   Comment 3            Comment: Due to the release kinetics of cTnI,     a negative result within the first hours     of the onset of symptoms does not rule out     myocardial infarction with certainty.     If myocardial infarction is still suspected,     repeat the test at appropriate intervals.  TYPE AND SCREEN     Status: None   Collection Time    06/05/14 11:31 PM       Result Value Ref Range   ABO/RH(D) A POS     Antibody Screen NEG     Sample Expiration 06/08/2014     Unit Number [id number]     Blood Component Type RED CELLS,LR     Unit division 00     Status of Unit ISSUED,FINAL     Transfusion Status OK TO TRANSFUSE     Crossmatch Result Compatible     Unit Number [id number]     Blood Component Type RED CELLS,LR     Unit division 00     Status of Unit ISSUED,FINAL     Transfusion Status OK TO TRANSFUSE     Crossmatch Result Compatible     Unit Number [id number]     Blood Component Type RED CELLS,LR     Unit division 00     Status of Unit ISSUED,FINAL     Transfusion Status OK TO TRANSFUSE     Crossmatch Result Compatible    ABO/RH     Status: None   Collection Time    06/05/14 11:31 PM      Result Value Ref Range   ABO/RH(D) A POS    PREPARE RBC (CROSSMATCH)     Status: None   Collection Time    06/06/14 12:00 AM      Result Value Ref Range   Order Confirmation ORDER PROCESSED BY BLOOD BANK    COMPREHENSIVE METABOLIC PANEL     Status: Abnormal   Collection Time    06/06/14 11:45 AM      Result Value Ref Range   Sodium 139  137 -  147 mEq/L   Potassium 3.8  3.7 - 5.3 mEq/L   Chloride 103  96 - 112 mEq/L   CO2 23  19 - 32 mEq/L   Glucose, Bld 85  70 - 99 mg/dL   BUN 5 (*) 6 - 23 mg/dL   Creatinine, Ser 0.67  0.50 - 1.10 mg/dL   Calcium 8.8  8.4 - 10.5 mg/dL   Total Protein 6.9  6.0 - 8.3 g/dL   Albumin 3.3 (*) 3.5 - 5.2 g/dL   AST 17  0 - 37 U/L   ALT 11  0 - 35 U/L   Alkaline Phosphatase 58  39 - 117 U/L   Total Bilirubin 1.0  0.3 - 1.2 mg/dL   GFR calc non Af Amer >90  >90 mL/min   GFR calc Af Amer >90  >90 mL/min   Comment: (NOTE)     The eGFR has been calculated using the CKD EPI equation.     This calculation has not been validated in all clinical situations.     eGFR's persistently <90 mL/min signify possible Chronic Kidney     Disease.   Anion gap 13  5 - 15  CBC WITH DIFFERENTIAL     Status: Abnormal    Collection Time    06/06/14 11:45 AM      Result Value Ref Range   WBC 5.7  4.0 - 10.5 K/uL   RBC 4.64  3.87 - 5.11 MIL/uL   Hemoglobin 8.8 (*) 12.0 - 15.0 g/dL   Comment: REPEATED TO VERIFY   HCT 28.9 (*) 36.0 - 46.0 %   MCV 62.3 (*) 78.0 - 100.0 fL   Comment: REPEATED TO VERIFY   MCH 19.0 (*) 26.0 - 34.0 pg   MCHC 30.4  30.0 - 36.0 g/dL   RDW 23.2 (*) 11.5 - 15.5 %   Platelets 432 (*) 150 - 400 K/uL   Comment: PLATELET COUNT CONFIRMED BY SMEAR     RESULT REPEATED AND VERIFIED   Neutrophils Relative % 62  43 - 77 %   Lymphocytes Relative 32  12 - 46 %   Monocytes Relative 6  3 - 12 %   Eosinophils Relative 0  0 - 5 %   Basophils Relative 0  0 - 1 %   Neutro Abs 3.6  1.7 - 7.7 K/uL   Lymphs Abs 1.8  0.7 - 4.0 K/uL   Monocytes Absolute 0.3  0.1 - 1.0 K/uL   Eosinophils Absolute 0.0  0.0 - 0.7 K/uL   Basophils Absolute 0.0  0.0 - 0.1 K/uL   RBC Morphology MIXED RBC POPULATION     Comment: ELLIPTOCYTES  TSH     Status: None   Collection Time    06/06/14 11:45 AM      Result Value Ref Range   TSH 0.828  0.350 - 4.500 uIU/mL  COMPREHENSIVE METABOLIC PANEL     Status: Abnormal   Collection Time    06/07/14  3:59 AM      Result Value Ref Range   Sodium 141  137 - 147 mEq/L   Potassium 3.9  3.7 - 5.3 mEq/L   Chloride 104  96 - 112 mEq/L   CO2 23  19 - 32 mEq/L   Glucose, Bld 84  70 - 99 mg/dL   BUN 8  6 - 23 mg/dL   Creatinine, Ser 0.60  0.50 - 1.10 mg/dL   Calcium 8.7  8.4 - 10.5 mg/dL   Total Protein  6.6  6.0 - 8.3 g/dL   Albumin 3.3 (*) 3.5 - 5.2 g/dL   AST 13  0 - 37 U/L   ALT 11  0 - 35 U/L   Alkaline Phosphatase 56  39 - 117 U/L   Total Bilirubin 0.3  0.3 - 1.2 mg/dL   GFR calc non Af Amer >90  >90 mL/min   GFR calc Af Amer >90  >90 mL/min   Comment: (NOTE)     The eGFR has been calculated using the CKD EPI equation.     This calculation has not been validated in all clinical situations.     eGFR's persistently <90 mL/min signify possible Chronic Kidney      Disease.   Anion gap 14  5 - 15  CBC WITH DIFFERENTIAL     Status: Abnormal   Collection Time    06/07/14  3:59 AM      Result Value Ref Range   WBC 5.9  4.0 - 10.5 K/uL   RBC 4.68  3.87 - 5.11 MIL/uL   Hemoglobin 8.9 (*) 12.0 - 15.0 g/dL   HCT 29.6 (*) 36.0 - 46.0 %   MCV 63.2 (*) 78.0 - 100.0 fL   MCH 19.0 (*) 26.0 - 34.0 pg   MCHC 30.1  30.0 - 36.0 g/dL   RDW 22.9 (*) 11.5 - 15.5 %   Platelets 452 (*) 150 - 400 K/uL   Neutrophils Relative % 56  43 - 77 %   Neutro Abs 3.3  1.7 - 7.7 K/uL   Lymphocytes Relative 37  12 - 46 %   Lymphs Abs 2.2  0.7 - 4.0 K/uL   Monocytes Relative 6  3 - 12 %   Monocytes Absolute 0.4  0.1 - 1.0 K/uL   Eosinophils Relative 1  0 - 5 %   Eosinophils Absolute 0.1  0.0 - 0.7 K/uL   Basophils Relative 0  0 - 1 %   Basophils Absolute 0.0  0.0 - 0.1 K/uL  PREPARE RBC (CROSSMATCH)     Status: None   Collection Time    06/07/14  9:17 AM      Result Value Ref Range   Order Confirmation ORDER PROCESSED BY BLOOD BANK    CBC WITH DIFFERENTIAL     Status: Abnormal   Collection Time    06/07/14  4:55 PM      Result Value Ref Range   WBC 7.5  4.0 - 10.5 K/uL   RBC 5.15 (*) 3.87 - 5.11 MIL/uL   Hemoglobin 10.4 (*) 12.0 - 15.0 g/dL   HCT 33.0 (*) 36.0 - 46.0 %   MCV 64.1 (*) 78.0 - 100.0 fL   MCH 20.2 (*) 26.0 - 34.0 pg   MCHC 31.5  30.0 - 36.0 g/dL   RDW 23.6 (*) 11.5 - 15.5 %   Platelets 528 (*) 150 - 400 K/uL   Neutrophils Relative % 62  43 - 77 %   Lymphocytes Relative 30  12 - 46 %   Monocytes Relative 7  3 - 12 %   Eosinophils Relative 1  0 - 5 %   Basophils Relative 0  0 - 1 %   Neutro Abs 4.6  1.7 - 7.7 K/uL   Lymphs Abs 2.3  0.7 - 4.0 K/uL   Monocytes Absolute 0.5  0.1 - 1.0 K/uL   Eosinophils Absolute 0.1  0.0 - 0.7 K/uL   Basophils Absolute 0.0  0.0 - 0.1 K/uL  RBC Morphology POLYCHROMASIA PRESENT     Comment: ELLIPTOCYTES   Smear Review LARGE PLATELETS PRESENT    VITAMIN B12     Status: None   Collection Time    06/07/14  4:55 PM       Result Value Ref Range   Vitamin B-12 315  211 - 911 pg/mL   Comment: Performed at [facility]     Status: None   Collection Time    06/07/14  4:55 PM      Result Value Ref Range   Folate 8.4     Comment: (NOTE)     Reference Ranges            Deficient:       0.4 - 3.3 ng/mL            Indeterminate:   3.4 - 5.4 ng/mL            Normal:              > 5.4 ng/mL     Performed at [facility]     Status: Abnormal   Collection Time    06/07/14  4:55 PM      Result Value Ref Range   Iron 70  42 - 135 ug/dL   TIBC 473 (*) 250 - 470 ug/dL   Saturation Ratios 15 (*) 20 - 55 %   UIBC 403 (*) 125 - 400 ug/dL   Comment: Performed at [facility]     Status: Abnormal   Collection Time    06/07/14  4:55 PM      Result Value Ref Range   Ferritin 9 (*) 10 - 291 ng/mL   Comment: Performed at [facility]     Status: Abnormal   Collection Time    06/07/14  4:55 PM      Result Value Ref Range   Retic Ct Pct 0.5  0.4 - 3.1 %   RBC. 5.23 (*) 3.87 - 5.11 MIL/uL   Retic Count, Manual 26.2  19.0 - 186.0 K/uL  CBC     Status: Abnormal   Collection Time    06/08/14  5:00 AM      Result Value Ref Range   WBC 5.6  4.0 - 10.5 K/uL   Comment: WHITE COUNT CONFIRMED ON SMEAR   RBC 4.83  3.87 - 5.11 MIL/uL   Hemoglobin 9.7 (*) 12.0 - 15.0 g/dL   HCT 30.8 (*) 36.0 - 46.0 %   MCV 63.8 (*) 78.0 - 100.0 fL   MCH 20.1 (*) 26.0 - 34.0 pg   MCHC 31.5  30.0 - 36.0 g/dL   RDW 23.7 (*) 11.5 - 15.5 %   Platelets 407 (*) 150 - 400 K/uL   Comment: PLATELET COUNT CONFIRMED BY SMEAR  CBC & DIFF AND RETIC     Status: Abnormal   Collection Time    07/09/14  8:32 AM      Result Value Ref Range   WBC 8.1  3.9 - 10.3 10e3/uL   NEUT# 6.1  1.5 - 6.5 10e3/uL   HGB 9.8 (*) 11.6 - 15.9 g/dL   HCT 31.0 (*) 34.8 - 46.6 %   Platelets 362  145 - 400 10e3/uL   MCV 69.8 (*) 79.5 - 101.0 fL   MCH 22.0 (*) 25.1 - 34.0 pg   MCHC 31.5   31.5 - 36.0 g/dL   RBC  4.45  3.70 - 5.45 10e6/uL   RDW 28.9 (*) 11.2 - 14.5 %   lymph# 1.6  0.9 - 3.3 10e3/uL   MONO# 0.3  0.1 - 0.9 10e3/uL   Eosinophils Absolute 0.1  0.0 - 0.5 10e3/uL   Basophils Absolute 0.0  0.0 - 0.1 10e3/uL   NEUT% 76.2  38.4 - 76.8 %   LYMPH% 19.2  14.0 - 49.7 %   MONO% 3.2  0.0 - 14.0 %   EOS% 1.0  0.0 - 7.0 %   BASO% 0.4  0.0 - 2.0 %   nRBC 0  0 - 0 %   Retic % 0.89  0.70 - 2.10 %   Retic Ct Abs 39.61  33.70 - 90.70 10e3/uL   Immature Retic Fract 18.30 (*) 1.60 - 10.00 %  FERRITIN [facility]     Status: Abnormal   Collection Time    07/09/14  8:32 AM      Result Value Ref Range   Ferritin 390 (*) 9 - 269 ng/ml   ASSESSMENT & PLAN:  Iron deficiency anemia, unspecified This patient has severe iron deficiency anemia requiring iron infusion. However, despite adequate iron replacement, she remains anemic. I suspect that might be a component of anemia chronic disease or possibly undiagnosed thalassemia. I recommend a recheck blood work again in 6 weeks.  S/P gastric bypass She remains at risk of multiple mineral deficiencies. She will continue vitamin B12 injection on a monthly basis.    All questions were answered. The patient knows to call the clinic with any problems, questions or concerns. I spent 40 minutes counseling the patient face to face. The total time spent in the appointment was 55 minutes and more than 50% was on counseling.     [facility], [place], MD 07/09/2014 10:05 PM

## 2014-07-10 ENCOUNTER — Telehealth: Payer: Self-pay | Admitting: Hematology and Oncology

## 2014-07-10 ENCOUNTER — Telehealth: Payer: Self-pay | Admitting: *Deleted

## 2014-07-10 NOTE — Telephone Encounter (Signed)
Pt questioning her Ferritin level result.  Informed her it is not a mistake,  It is high due to getting IV iron last month.   Pt also wants to know which IV iron is less expensive?  Iron sucrose or Feraheme?

## 2014-07-10 NOTE — Telephone Encounter (Signed)
Maybe feraheme

## 2014-07-10 NOTE — Telephone Encounter (Signed)
s.w. pt and advised on NOV, Jan and Feb appts.[name].[name].[name].[name].pt ok adn aware....sed added tx.

## 2014-07-11 ENCOUNTER — Encounter: Payer: Self-pay | Admitting: Hematology and Oncology

## 2014-07-11 NOTE — Progress Notes (Signed)
Called and spoke with the patient. She needs to know what the cost/price of the iron infusion dr. g wants to use. I left message for nurse, because patient wants to make decision on how much it may cost.

## 2014-08-13 ENCOUNTER — Other Ambulatory Visit: Payer: 59

## 2014-08-13 ENCOUNTER — Telehealth: Payer: Self-pay | Admitting: Hematology and Oncology

## 2014-08-13 NOTE — Telephone Encounter (Signed)
s.w. pt and r/s appt....per pt request °

## 2014-08-15 ENCOUNTER — Telehealth: Payer: Self-pay | Admitting: *Deleted

## 2014-08-15 ENCOUNTER — Telehealth: Payer: Self-pay | Admitting: Internal Medicine

## 2014-08-15 ENCOUNTER — Other Ambulatory Visit (HOSPITAL_BASED_OUTPATIENT_CLINIC_OR_DEPARTMENT_OTHER): Payer: 59

## 2014-08-15 DIAGNOSIS — Z9884 Bariatric surgery status: Secondary | ICD-10-CM

## 2014-08-15 DIAGNOSIS — D509 Iron deficiency anemia, unspecified: Secondary | ICD-10-CM

## 2014-08-15 LAB — CBC & DIFF AND RETIC
BASO%: 0.2 % (ref 0.0–2.0)
Basophils Absolute: 0 10*3/uL (ref 0.0–0.1)
EOS%: 0.6 % (ref 0.0–7.0)
Eosinophils Absolute: 0 10*3/uL (ref 0.0–0.5)
HCT: 33 % — ABNORMAL LOW (ref 34.8–46.6)
HGB: 10.5 g/dL — ABNORMAL LOW (ref 11.6–15.9)
Immature Retic Fract: 10.6 % — ABNORMAL HIGH (ref 1.60–10.00)
LYMPH%: 23 % (ref 14.0–49.7)
MCH: 24.8 pg — ABNORMAL LOW (ref 25.1–34.0)
MCHC: 31.8 g/dL (ref 31.5–36.0)
MCV: 78 fL — ABNORMAL LOW (ref 79.5–101.0)
MONO#: 0.2 10*3/uL (ref 0.1–0.9)
MONO%: 3.9 % (ref 0.0–14.0)
NEUT#: 3.9 10*3/uL (ref 1.5–6.5)
NEUT%: 72.3 % (ref 38.4–76.8)
Platelets: 375 10*3/uL (ref 145–400)
RBC: 4.23 10*6/uL (ref 3.70–5.45)
RDW: 19.7 % — ABNORMAL HIGH (ref 11.2–14.5)
Retic %: 1.26 % (ref 0.70–2.10)
Retic Ct Abs: 53.3 10*3/uL (ref 33.70–90.70)
WBC: 5.4 10*3/uL (ref 3.9–10.3)
lymph#: 1.3 10*3/uL (ref 0.9–3.3)

## 2014-08-15 LAB — IRON AND TIBC CHCC
%SAT: 31 % (ref 21–57)
Iron: 93 ug/dL (ref 41–142)
TIBC: 298 ug/dL (ref 236–444)
UIBC: 205 ug/dL (ref 120–384)

## 2014-08-15 LAB — FERRITIN CHCC: Ferritin: 288 ng/ml — ABNORMAL HIGH (ref 9–269)

## 2014-08-15 NOTE — Telephone Encounter (Signed)
Iron level is good, additional studies in progress

## 2014-08-15 NOTE — Telephone Encounter (Signed)
Left VM for pt informing of CBC and Iron studies all good.  Waiting on results of other tests may take up to one week for results.

## 2014-08-15 NOTE — Telephone Encounter (Signed)
Called patient - no answer message left - will send message to DR HILTY/ JENNA

## 2014-08-15 NOTE — Telephone Encounter (Signed)
Pt called to discuss her lab results in detail.  Informed her as much as possible and instructed to call back next week when all her results will be back. She verbalized understanding.

## 2014-08-15 NOTE — Telephone Encounter (Signed)
Spoke with patient. She wants to know what can be done about her sinus tachycardia - she states she needed to know what to do after she wore the event monitor in July. She was ordered a GXT and sleep study as well and was instructed to f/up with Dr. Rennis GoldenHilty in 1 month after those tests/monitor. GXT & sleep study were never completed, but patient is aware she should reschedule her GXT (and scheduler will be notified)  Patient c/o palpitations that come and go at various times during the day. She states she has some chest pain preceding and after the palpitation episodes.   Will defer to Dr. Rennis GoldenHilty to advise, but informed patient having the stress test and sleep study results, and an in person office visit may be best, since her last visit was > 3 months ago

## 2014-08-15 NOTE — Telephone Encounter (Signed)
Please call,question about event monitor that she had in July.She allso wants to know what can be done about the sinus tachycardia? After 5:30 please [phone].

## 2014-08-15 NOTE — Telephone Encounter (Signed)
Pt left VM asking about lab results from this morning.

## 2014-08-16 ENCOUNTER — Telehealth: Payer: Self-pay | Admitting: *Deleted

## 2014-08-16 NOTE — Telephone Encounter (Signed)
Pt called for results of Sed Rate. Informed her it is elevated at 44.  I will ask Dr. Bertis RuddyGorsuch to call her next week w/ the results of hemoglobinopathy evaluation.  She verbalized understanding.

## 2014-08-17 LAB — HEMOGLOBINOPATHY EVALUATION
HGB A2 QUANT: 3.9 % — AB (ref 2.2–3.2)
HGB A: 85.1 % — AB (ref 96.8–97.8)
Hemoglobin Other: 0 %
Hgb F Quant: 11 % — ABNORMAL HIGH (ref 0.0–2.0)
Hgb S Quant: 0 %

## 2014-08-17 LAB — SEDIMENTATION RATE: Sed Rate: 44 mm/hr — ABNORMAL HIGH (ref 0–22)

## 2014-08-17 NOTE — Telephone Encounter (Signed)
She needs to have the studies I ordered and follow-up with me.  Dr. HRexene Edison

## 2014-08-17 NOTE — Telephone Encounter (Signed)
Left VM for patient that Dr. Rennis GoldenHilty advises she reschedule GXT and sleep study and f/up with him.   Message routed to scheduler to set up tests and follow up

## 2014-08-20 ENCOUNTER — Telehealth: Payer: Self-pay | Admitting: Hematology and Oncology

## 2014-08-20 ENCOUNTER — Other Ambulatory Visit: Payer: Self-pay | Admitting: Hematology and Oncology

## 2014-08-20 ENCOUNTER — Telehealth (HOSPITAL_COMMUNITY): Payer: Self-pay | Admitting: *Deleted

## 2014-08-20 ENCOUNTER — Encounter: Payer: Self-pay | Admitting: Hematology and Oncology

## 2014-08-20 DIAGNOSIS — R700 Elevated erythrocyte sedimentation rate: Secondary | ICD-10-CM | POA: Insufficient documentation

## 2014-08-20 DIAGNOSIS — M1990 Unspecified osteoarthritis, unspecified site: Secondary | ICD-10-CM

## 2014-08-20 HISTORY — DX: Unspecified osteoarthritis, unspecified site: M19.90

## 2014-08-20 NOTE — Telephone Encounter (Signed)
I review the test result with the patient. 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 Her iron studies show that she is no longer iron deficient. The patient requests a rheumatology evaluation due to severe arthritis pain and elevated sedimentation rate. I will refer her to see a rheumatologist.

## 2014-08-21 ENCOUNTER — Encounter (HOSPITAL_COMMUNITY): Payer: Self-pay | Admitting: *Deleted

## 2014-08-21 NOTE — Telephone Encounter (Signed)
Patient scheduled for exercise tolerance test and OV 12/8

## 2014-08-27 ENCOUNTER — Encounter: Payer: Self-pay | Admitting: Hematology and Oncology

## 2014-08-28 ENCOUNTER — Telehealth: Payer: Self-pay | Admitting: Hematology and Oncology

## 2014-08-28 NOTE — Telephone Encounter (Signed)
lvm for GMA to sched pt for appt.[name].[name].[facility] [phone] pt is aware

## 2014-08-29 ENCOUNTER — Telehealth: Payer: Self-pay | Admitting: Hematology and Oncology

## 2014-08-29 NOTE — Telephone Encounter (Signed)
Faxed pt medical records to Dr. Dierdre ForthBeekman [phone]

## 2014-09-11 NOTE — Telephone Encounter (Signed)
Called [phone], message left for new patient coordinator about status of referral, appointment date and time.  Awaiting return call from Dr[facility]

## 2014-09-13 ENCOUNTER — Telehealth (HOSPITAL_COMMUNITY): Payer: Self-pay

## 2014-09-13 NOTE — Telephone Encounter (Signed)
Encounter complete. 

## 2014-09-14 ENCOUNTER — Telehealth (HOSPITAL_COMMUNITY): Payer: Self-pay

## 2014-09-14 ENCOUNTER — Other Ambulatory Visit: Payer: Self-pay | Admitting: Medical Oncology

## 2014-09-14 NOTE — Telephone Encounter (Signed)
Encounter complete. 

## 2014-09-18 ENCOUNTER — Encounter: Payer: Self-pay | Admitting: *Deleted

## 2014-09-18 ENCOUNTER — Telehealth: Payer: Self-pay | Admitting: Hematology and Oncology

## 2014-09-18 ENCOUNTER — Ambulatory Visit (INDEPENDENT_AMBULATORY_CARE_PROVIDER_SITE_OTHER): Payer: 59 | Admitting: Internal Medicine

## 2014-09-18 ENCOUNTER — Encounter: Payer: Self-pay | Admitting: Internal Medicine

## 2014-09-18 ENCOUNTER — Ambulatory Visit (HOSPITAL_COMMUNITY)
Admission: RE | Admit: 2014-09-18 | Discharge: 2014-09-18 | Disposition: A | Discharge status: Home / Self Care | Payer: 59 | Source: Ambulatory Visit | Attending: Internal Medicine | Admitting: Internal Medicine

## 2014-09-18 VITALS — BP 106/78 | HR 92 | Ht 60.0 in | Wt 205.0 lb

## 2014-09-18 DIAGNOSIS — R079 Chest pain, unspecified: Secondary | ICD-10-CM

## 2014-09-18 DIAGNOSIS — R002 Palpitations: Secondary | ICD-10-CM | POA: Diagnosis not present

## 2014-09-18 DIAGNOSIS — I10 Essential (primary) hypertension: Secondary | ICD-10-CM

## 2014-09-18 DIAGNOSIS — R0789 Other chest pain: Secondary | ICD-10-CM

## 2014-09-18 NOTE — Progress Notes (Signed)
OFFICE NOTE  Chief Complaint:  Follow-up stress test  Primary Care Physician: Egbert GaribaldiMillsaps, KIMBERLY M, NP  HPI:  Meredith Byrd is a 33 year old female who was previously seen by Dr. Herbie BaltimoreHarding in our practice in 2012. She's had a history of chest pain which has been present for many years as well as hypertension, obesity and chronic headaches of the migraine type. She underwent a treadmill exercise stress test in 2012. This was negative for ischemia however she did only exercised for 3-1/2 minutes achieving 5.3 metabolic equivalents which is quite poor for a 33 year old. She did great reach 87% of the max predicted heart rate. No chest pain was reported during the study. She also underwent an echocardiogram which showed borderline concentric LVH with EF greater than 55%. There was mild mitral annular calcification and trace mitral regurgitation. An insignificant pericardial effusion was noted. He was placed on losartan for blood pressure control and continues to take that medication. Her main concerns today has to do with more frequent palpitations and more intense chest pain, which he notes with exertion and at rest. She describes the chest pain is sharp, substernal and nonradiating. Sometimes is worse when she takes a deep breath and it has to cause her to hold her breath. It is rated an 8 or 9/10 but goes away within a few seconds to a minute. She gets short of breath with exercise and does report some swelling in her ankles. She has had about 8 pound weight loss since her last office visit however body mass index is still over 40 and she does have a history of gastric bypass surgery. Blood pressure appears well controlled today. She's not on any AV nodal blockers.   Meredith Byrd  Returns today for follow-up. I saw her back in July and had recommended an exercise treadmill stress test as well as have her wear a monitor. She did wear that monitor for week and had some sinus tachycardia but  otherwise sinus rhythm. No palpitations or extrasystoles or A. Fib were noted. She also underwent an exercise treadmill stress test which was actually this morning. I interpreted that stress test is negative for ischemia with a fair workload. Limitation to exercise was probably due to the fact that she is overweight and does not exercise.  Past Medical History  Diagnosis Date  . Allergy   . Anemia   . GERD (gastroesophageal reflux disease)   . Hypertension   . Migraines   . Palpitations   . Hx of echocardiogram     The echocadiogram was essentially normal with the exception of mild mitral calcification and borderline concentric LVH, which in the setting of her hypertension at this early age is something that mean her blood pressure is well controlled.   [name]. History of stress test 02/2011 ([facility])    there was no evidence of ischemia, but she only went 3 1/2 minutes on the treadmill making it very difficult to get a good accurate assessment, however  . Iron deficiency anemia, unspecified 07/09/2014  . Arthritis pain 08/20/2014    Past Surgical History  Procedure Laterality Date  . Cholecystectomy    . Breast surgery    . Gastric bypass  2008  . Oophorectomy Right 2010    FAMHx:  Family History  Problem Relation Age of Onset  . Asthma Mother   . Heart disease Mother   . Diabetes Mother   . Early death Mother   . Hypertension Mother   . Stroke  Maternal Grandmother   . Heart disease Maternal Grandmother   . Hypertension Maternal Grandmother   . Hyperlipidemia Maternal Grandmother     SOCHx:   reports that she has never smoked. She has never used smokeless tobacco. She reports that she does not drink alcohol or use illicit drugs.  ALLERGIES:  No Known Allergies  ROS: A comprehensive review of systems was negative except for: Constitutional: positive for fatigue Respiratory: positive for dyspnea on exertion Cardiovascular: positive for chest pain and palpitations  HOME  MEDS: Current Outpatient Prescriptions  Medication Sig Dispense Refill  . alosetron (LOTRONEX) 1 MG tablet Take 2 mg by mouth daily.    . butorphanol (STADOL) 10 MG/ML nasal spray Place 1 spray into the nose every 4 (four) hours as needed for headache.    . clidinium-chlordiazePOXIDE (LIBRAX) 5-2.5 MG per capsule 2 capsules 4 times daily. 60 capsule 3  . Cyanocobalamin (VITAMIN B-12 IJ) Inject 1 Applicatorful as directed every 30 (thirty) days.     [name]. gabapentin (NEURONTIN) 300 MG capsule Take 300 mg by mouth 3 (three) times daily.    [name]. losartan-hydrochlorothiazide (HYZAAR) 50-12.5 MG per tablet Take 1 tablet by mouth daily.    . MedroxyPROGESTERone Acetate (DEPO-PROVERA IM) Inject 1 Applicatorful into the muscle every 3 (three) months.     [name]. omeprazole (PRILOSEC) 40 MG capsule Take 40 mg by mouth daily.    . traZODone (DESYREL) 50 MG tablet Take 150 mg by mouth at bedtime as needed for sleep.     . Vitamin D, Ergocalciferol, (DRISDOL) 50000 UNITS CAPS capsule Take 50,000 Units by mouth every 7 (seven) days.     No current facility-administered medications for this visit.    LABS/IMAGING: No results found for this or any previous visit (from the past 48 hour(s)). No results found.  VITALS: BP 106/78 mmHg  Pulse 92  Ht 5' (1.524 m)  Wt 205 lb (92.987 kg)  BMI 40.04 kg/m2  EXAM: deferred  EKG: deferred  ASSESSMENT: 1. Atypical chest pain -  Negative treadmill stress test 2. Palpitations -  No ectopy noted on her monitor 3. Dyspnea and exertion 4. Snoring, fatigue, poor sleep, headaches, EPWSS >10 5. HTN - controlled  PLAN: 1.   Meredith Byrd  Had a negative exercise treadmill stress test. Her monitor did not show any clear cause of palpitations. I still think should benefit from having a sleep study however that is not been arranged.  I understand recently she had an abnormal sedimentation rate and there is concern for a panel arthritis, possibly connective tissue disorder.  She should continue to have follow-up about this with a rheumatologist. From a cardiac standpoint I think she is at low risk.   Follow-up with me can be as needed. I would encourage her to scheduled a sleep study that we had referred her for.   Thank you again for Ranae PlumberLyman to participate in her care.  Chrystie NoseKenneth C. Hilty, MD, [facility] Institute[facility]ACC Attending Cardiologist [facility]  HILTY,Kenneth C 09/18/2014, 5:23 PM

## 2014-09-18 NOTE — Telephone Encounter (Signed)
received mychart message from triage nurse to f/u on rheumatology referral. s/w [name] @ Dr. Melissa Noon office and per [name] no appointment was given due to the info they received from our office was in regards to anemia and they would not see the patient for anemia. I informed [name] that the referral was requested for eleveated ESR and severe arthritis pain. per [name] they will need information that supports this as the info/last office note they received does not. S/w desk nurse re supplying supporting information in order to obtain this appt. lmonvm for pt with update.

## 2014-09-18 NOTE — Patient Instructions (Signed)
Your physician recommends that you schedule a follow-up appointment as needed  

## 2014-09-18 NOTE — Procedures (Signed)
Exercise Treadmill Test   Test  Exercise Tolerance Test Ordering MD: Chrystie NoseKenneth C. Hilty, MD    Unique Test No: 1  Treadmill:  1  Indication for ETT: chest pain - rule out ischemia  Contraindication to ETT: No   Stress Modality: exercise - treadmill  Cardiac Imaging Performed: non   Protocol: standard Bruce - maximal  Max BP:  139/88  Max MPHR (bpm):  186 85% MPR (bpm):  158  MPHR obtained (bpm):  162 % MPHR obtained:  87  Reached 85% MPHR (min:sec):  5:05 Total Exercise Time (min-sec):  5:15  Workload in METS:  7.0 Borg Scale: 16  Reason ETT Terminated:  Marked SOB, Chest tightness and Fatigue    ST Segment Analysis At Rest: normal ST segments - no evidence of significant ST depression With Exercise: no evidence of significant ST depression  Other Information Arrhythmia:  No Angina during ETT:  present (1) Quality of ETT:  diagnostic  ETT Interpretation:  normal - no evidence of ischemia by ST analysis  Comments: Fair exercise tolerance Mild chest tightness due to dyspnea No ischemic EKG changes No extrasystoles  Chrystie NoseKenneth C. Hilty, MD, [facility]ACC Attending Cardiologist [facility]

## 2014-09-18 NOTE — Progress Notes (Signed)
Per Dr. Maxine GlennGorsuch's request, progress note dated 08/20/14 faxed to Dr. Shawnee KnappBeekman's office at [phone].

## 2014-09-19 ENCOUNTER — Telehealth: Payer: Self-pay | Admitting: *Deleted

## 2014-09-19 NOTE — Telephone Encounter (Signed)
[facility].  Pt reported dosage of 150 mg. If dosage is correct please send in new Rx with correct dosage.  Clovis PuMartin, Tamika L, RN

## 2014-09-20 NOTE — Telephone Encounter (Signed)
No longer seeing me

## 2014-09-26 ENCOUNTER — Telehealth: Payer: Self-pay | Admitting: Hematology and Oncology

## 2014-09-26 NOTE — Telephone Encounter (Signed)
[facility] yesterday. per Cala BradfordKimberly she did not receive any additional information as to why the pt should be seen in their office. s/w desk nurse and was provided additional lab information to send to Dr. Dierdre ForthBeekman. s/w Cala BradfordKimberly today and per Cala BradfordKimberly she has received that information and it has been sent to Dr. Dierdre ForthBeekman for review. s/w pt today and gv her an update on status of appt. pt aware when I hear back from Dr. Shawnee KnappBeekman's office re appt I will call her. Office [phone] and fax [phone].

## 2014-09-27 ENCOUNTER — Telehealth: Payer: Self-pay | Admitting: *Deleted

## 2014-09-27 NOTE — Telephone Encounter (Signed)
Received [facility]  Dr. Dierdre ForthBeekman will not accept referral for Rheumatology.  State he does not think pt needs to see Rheumatologyl based on chart review.  Says elevated Sed Rate is r/t pt's anemia.

## 2014-09-27 NOTE — Telephone Encounter (Signed)
OK to try

## 2014-09-28 ENCOUNTER — Telehealth: Payer: Self-pay | Admitting: Hematology and Oncology

## 2014-09-28 ENCOUNTER — Telehealth: Payer: Self-pay | Admitting: *Deleted

## 2014-09-28 DIAGNOSIS — M1990 Unspecified osteoarthritis, unspecified site: Secondary | ICD-10-CM

## 2014-09-28 DIAGNOSIS — R700 Elevated erythrocyte sedimentation rate: Secondary | ICD-10-CM

## 2014-09-28 NOTE — Telephone Encounter (Signed)
Informed pt of Dr. Dierdre ForthBeekman will not accept referral.   Offered to make referral to another Rheumatologist such as Dr. Corliss Skainseveshwar.  Pt state she has heard "good things" about Dr. Corliss Skainseveshwar and would like Dr. Bertis RuddyGorsuch to make that referral.

## 2014-09-28 NOTE — Telephone Encounter (Signed)
s.w. pt and advised that all her records are being sent to Dr. Donell Sieverteveshawarrs office he will review and determine if he will take her and his office will call her.[name].[name].[name].I gv her Dr. Leroy Kennedyeveshawars office #

## 2014-10-01 ENCOUNTER — Telehealth: Payer: Self-pay | Admitting: Hematology and Oncology

## 2014-10-01 NOTE — Telephone Encounter (Signed)
Faxed pt medical records to Dr. Leroy Kennedyeveshawars office

## 2014-10-13 ENCOUNTER — Ambulatory Visit (INDEPENDENT_AMBULATORY_CARE_PROVIDER_SITE_OTHER): Payer: 59 | Admitting: Family Medicine

## 2014-10-13 ENCOUNTER — Ambulatory Visit (INDEPENDENT_AMBULATORY_CARE_PROVIDER_SITE_OTHER): Payer: 59

## 2014-10-13 VITALS — BP 120/70 | HR 89 | Temp 98.3°F | Resp 19 | Ht 58.5 in | Wt 204.4 lb

## 2014-10-13 DIAGNOSIS — G43001 Migraine without aura, not intractable, with status migrainosus: Secondary | ICD-10-CM

## 2014-10-13 DIAGNOSIS — M546 Pain in thoracic spine: Secondary | ICD-10-CM

## 2014-10-13 MED ORDER — KETOROLAC TROMETHAMINE 60 MG/2ML IM SOLN
60.0000 mg | Freq: Once | INTRAMUSCULAR | Status: AC
Start: 2014-10-13 — End: 2014-10-13
  Administered 2014-10-13: 60 mg via INTRAMUSCULAR

## 2014-10-13 MED ORDER — TIZANIDINE HCL 4 MG PO TABS: ORAL_TABLET | ORAL | Status: DC

## 2014-10-13 MED ORDER — BUTALBITAL-APAP-CAFFEINE 50-325-40 MG PO TABS: 1.0000 | ORAL_TABLET | Freq: Four times a day (QID) | ORAL | Status: DC | PRN

## 2014-10-13 NOTE — Patient Instructions (Signed)
Take the Fioricet 1 or 2 every 6 hours only if needed for severe headache  Try taking the tizanidine one half to one of a 2 mg pill at bedtime. This is a muscle relaxant which helps headaches considerably.  [facility]. If they will not take you back in without another referral call back here.  Follow-up with your primary as necessary or return here if we can be of assistance.

## 2014-10-13 NOTE — Progress Notes (Signed)
Subjective: Patient has been having a bad headache for the last couple of days. She has about 3 migraines a month. She goes to primary care doctor who gives her Stadol intranasally to use. That has not been helping. She had a chest x-ray not long ago which showed some arthritic changes in her back. She was concerned about that because she's been hurting more from about the bra strap level of her back all the way down to the low back. Her specific injury. 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 She has a history of migraines going back when she was a child. This was been very bad, 6 or 7.  Objective: Looks like she feels pretty miserable. Eyes PERRLA. Photophobic. EOMs intact. TMs normal. Throat clear. Neck supple without nodes thyromegaly. Chest clear. Heart regular without murmurs gallops or arrhythmias. No carotid bruits. Extremities normal. Coordination good. She is tender in her back from about the bra strap line down to the lower lumbar spine.  Assessment: Mid and low back pain Migraine headache  Plan: X-ray of back  [facility] reading (PRIMARY) by  Dr. Alwyn Ren Satisfactory lumbar spine. In the lower thoracic spine a small amount of spurring is noted from degenerative changes. No significant curvature is noted.  Will treat the migraines with some Fioricet and see how she does with that gave her some tizanidine which will help both back and migraine. Gave her a back owner's manual.   Return if needed or see her primary   .

## 2014-10-15 ENCOUNTER — Telehealth: Payer: Self-pay

## 2014-10-15 NOTE — Telephone Encounter (Signed)
Requesting copy of x-ray,  Back.    [phone]  Information on growth on her back , wants to talk to a nurse.

## 2014-10-15 NOTE — Telephone Encounter (Signed)
Pt notified Xray will be ready at 5pm. Pt wants copies of the OV and the xray results also- printed and put in pick up drawer- pt needs to sign ROI form. Noted on front of envelope.

## 2014-10-18 ENCOUNTER — Telehealth: Payer: Self-pay

## 2014-10-18 ENCOUNTER — Other Ambulatory Visit: Payer: Self-pay | Admitting: Family Medicine

## 2014-10-18 NOTE — Telephone Encounter (Signed)
Refill once.  No further refills unless seen.

## 2014-10-18 NOTE — Telephone Encounter (Signed)
Pt saw Dr. Alwyn RenHopper on Saturday,was given migraine, and muscel relaxer rx, pt state that muscle relaxer is not working, and would like to know if there is something else she can be prescribed. Please advise

## 2014-10-18 NOTE — Telephone Encounter (Signed)
Pt still is having a headache- not as bad as before. Muscle relaxer is not doing anything for her. Can a different one be prescribed?

## 2014-10-19 ENCOUNTER — Other Ambulatory Visit: Payer: Self-pay | Admitting: Family Medicine

## 2014-10-19 MED ORDER — METHOCARBAMOL 750 MG PO TABS: 750.0000 mg | ORAL_TABLET | Freq: Four times a day (QID) | ORAL | Status: DC

## 2014-10-19 NOTE — Telephone Encounter (Signed)
Notified pt and she agreed, but asked about change in muscle relaxant (see phone message). I advised that I will check w/Dr Alwyn RenHopper and we will let her know.

## 2014-10-19 NOTE — Telephone Encounter (Signed)
Prescribe Robaxin 750 mg #40 one 4 times a day when necessary muscle relaxant. If continued to have bad headache after a day or 2 this she needs seen.

## 2014-10-19 NOTE — Telephone Encounter (Signed)
Pt advised.

## 2014-10-24 ENCOUNTER — Ambulatory Visit (INDEPENDENT_AMBULATORY_CARE_PROVIDER_SITE_OTHER): Payer: 59 | Admitting: Emergency Medicine

## 2014-10-24 VITALS — BP 112/82 | HR 85 | Temp 98.6°F | Resp 16 | Ht 58.5 in | Wt 211.0 lb

## 2014-10-24 DIAGNOSIS — G43901 Migraine, unspecified, not intractable, with status migrainosus: Secondary | ICD-10-CM

## 2014-10-24 MED ORDER — KETOROLAC TROMETHAMINE 60 MG/2ML IM SOLN
60.0000 mg | Freq: Once | INTRAMUSCULAR | Status: AC
  Administered 2014-10-24: 60 mg via INTRAMUSCULAR

## 2014-10-24 MED ORDER — PROMETHAZINE HCL 25 MG/ML IJ SOLN
25.0000 mg | Freq: Once | INTRAMUSCULAR | Status: AC
  Administered 2014-10-24: 25 mg via INTRAMUSCULAR

## 2014-10-24 MED ORDER — SUMATRIPTAN SUCCINATE 100 MG PO TABS: 100.0000 mg | ORAL_TABLET | ORAL | Status: DC | PRN

## 2014-10-24 MED ORDER — OXYCODONE-ACETAMINOPHEN 5-325 MG PO TABS: 1.0000 | ORAL_TABLET | Freq: Three times a day (TID) | ORAL | Status: DC | PRN

## 2014-10-24 MED ORDER — HYDROCODONE-ACETAMINOPHEN 5-325 MG PO TABS: 1.0000 | ORAL_TABLET | Freq: Four times a day (QID) | ORAL | Status: DC | PRN

## 2014-10-24 NOTE — Patient Instructions (Signed)
You will get a call to make appt with headache clinic. Continue with home medications. If not getting better by tomorrow, go to the emergency room for further treatment.

## 2014-10-24 NOTE — Progress Notes (Signed)
Subjective:    Patient ID: Meredith Byrd, female    DOB: [birth year], 34 y.o.   MRN: [id number]  HPI  This is a 34 year old female with PMH migraine headaches who is presenting with migraine x 2 weeks. She was seen here on 10/13/14 and seen by Dr. Alwyn RenHopper. At that time she was using stadol nasal spray for symptoms and not working. Dr. Alwyn RenHopper gave her toradol IM, fioricet, and robaxin. She is using nasal spray every 6 hours and fioricet every 6-8 hours. She states nothing has helped. She reports the severity of the headache varies - at lowest it is 5/10 and today it is 10/10. The location moves sides and is located behind her eyes.  She is experiencing photophobia, phonophobia, sensitivity to movement and nausea. She experienced some blurred vision earlier today that lasted 1 hour. She denies vomiting. She reports her migraines have worsened as she has gotten older. She currently gets a migraine 3-4 times a month. They are not related to her menstrual cycle. She is on depo. She was seen at the headache clinic in [place] in 2009 but had to stop going there due to outstanding bills. She was tried on topamax at that time which helped but caused memory loss so she stopped. She also tried three different triptans at that time and states none of them worked for her.   Review of Systems  Constitutional: Negative for fever and chills.  HENT: Negative for congestion and sinus pressure.   Eyes: Positive for photophobia and visual disturbance.  Gastrointestinal: Positive for nausea. Negative for vomiting.  Genitourinary: Negative for menstrual problem.  Musculoskeletal: Negative for neck pain.  Skin: Negative for color change.  Neurological: Positive for headaches. Negative for seizures, syncope, speech difficulty, weakness and numbness.   Patient Active Problem List   Diagnosis Date Noted  . Elevated sed rate 08/20/2014  . Arthritis pain 08/20/2014  . Iron deficiency anemia, unspecified  07/09/2014  . Symptomatic anemia 06/05/2014  . Chest pain 05/01/2014  . GERD (gastroesophageal reflux disease) 03/22/2014  . S/P gastric bypass 03/22/2014  . Insomnia 03/22/2014  . Migraine 03/22/2014  . HTN (hypertension) 03/22/2014  . Anemia 03/22/2014  . Palpitations 03/22/2014  . Obesity 03/22/2014   Prior to Admission medications   Medication Sig Start Date End Date Taking? Authorizing Provider  alosetron (LOTRONEX) 1 MG tablet Take 2 mg by mouth daily.   Yes Historical Provider, MD  butalbital-acetaminophen-caffeine (FIORICET, ESGIC) 50-325-40 MG per tablet TAKE 1 TO 2 TABLETS BY MOUTH EVERY 6 HOURS AS NEEDED FOR HEADACHE 10/18/14  Yes Peyton Najjaravid H Hopper, MD  butorphanol (STADOL) 10 MG/ML nasal spray Place 1 spray into the nose every 4 (four) hours as needed for headache.   Yes Historical Provider, MD  clidinium-chlordiazePOXIDE (LIBRAX) 5-2.5 MG per capsule 2 capsules 4 times daily. 03/22/14  Yes Tommie SamsJayce G Cook, DO  Cyanocobalamin (VITAMIN B-12 IJ) Inject 1 Applicatorful as directed every 30 (thirty) days.    Yes Historical Provider, MD  gabapentin (NEURONTIN) 300 MG capsule Take 300 mg by mouth 3 (three) times daily.   Yes Historical Provider, MD  losartan-hydrochlorothiazide (HYZAAR) 50-12.5 MG per tablet Take 1 tablet by mouth daily.   Yes Historical Provider, MD  MedroxyPROGESTERone Acetate (DEPO-PROVERA IM) Inject 1 Applicatorful into the muscle every 3 (three) months.    Yes Historical Provider, MD  methocarbamol (ROBAXIN-750) 750 MG tablet Take 1 tablet (750 mg total) by mouth 4 (four) times daily. 10/19/14  Yes Sandria Balesavid H  Alwyn Ren, MD  omeprazole (PRILOSEC) 40 MG capsule Take 40 mg by mouth daily.   Yes Historical Provider, MD  tiZANidine (ZANAFLEX) 4 MG tablet Take one half to one twice daily if needed for headache. Will cause drowsiness. 10/13/14  Yes Peyton Najjar, MD  traZODone (DESYREL) 50 MG tablet Take 150 mg by mouth at bedtime as needed for sleep.  03/22/14  Yes Tommie Sams, DO    Vitamin D, Ergocalciferol, (DRISDOL) 50000 UNITS CAPS capsule Take 50,000 Units by mouth every 7 (seven) days.   Yes Historical Provider, MD                 No Known Allergies  Patient's social and family history were reviewed.     Objective:   Physical Exam  Constitutional: She is oriented to person, place, and time. She appears well-developed and well-nourished. No distress.  HENT:  Head: Normocephalic and atraumatic.  Right Ear: Hearing, tympanic membrane, external ear and ear canal normal.  Left Ear: Hearing, tympanic membrane, external ear and ear canal normal.  Nose: Nose normal. Right sinus exhibits no maxillary sinus tenderness and no frontal sinus tenderness. Left sinus exhibits no maxillary sinus tenderness and no frontal sinus tenderness.  Mouth/Throat: Uvula is midline, oropharynx is clear and moist and mucous membranes are normal.  Eyes: Conjunctivae, EOM and lids are normal. Pupils are equal, round, and reactive to light. Right eye exhibits no discharge. Left eye exhibits no discharge. No scleral icterus.  Neck: Normal range of motion. No muscular tenderness present.  Cardiovascular: Normal rate, regular rhythm, intact distal pulses and normal pulses.   Murmur (known) heard. Pulmonary/Chest: Effort normal and breath sounds normal. No respiratory distress. She has no wheezes. She has no rhonchi. She has no rales.  Abdominal: Soft. Normal appearance. There is no tenderness.  Musculoskeletal: Normal range of motion.  Lymphadenopathy:       Head (right side): No submental, no submandibular and no tonsillar adenopathy present.       Head (left side): No submental, no submandibular and no tonsillar adenopathy present.    She has no cervical adenopathy.  Neurological: She is alert and oriented to person, place, and time. She has normal strength and normal reflexes. No cranial nerve deficit or sensory deficit. Coordination and gait normal.  Skin: Skin is warm, dry and intact. No  lesion and no rash noted.  Psychiatric: She has a normal mood and affect. Her speech is normal and behavior is normal. Thought content normal.      Assessment & Plan:  1. Migraine with status migrainosus, not intractable, unspecified migraine type Pt's migraine has not responded to treatment thus far. Gave pt IM toradol and phenergan in office. Prescribed 6 percocet. Pt wants to try a triptan again since it has been 7 years since she tried one. Referred her to headache clinic/neurology for further management. If home meds continue to be ineffective, advised her to go to the ED.  - AMB referral to headache clinic - promethazine (PHENERGAN) injection 25 mg; Inject 1 mL (25 mg total) into the muscle once. - ketorolac (TORADOL) injection 60 mg; Inject 2 mLs (60 mg total) into the muscle once. - oxyCODONE-acetaminophen (ROXICET) 5-325 MG per tablet; Take 1 tablet by mouth every 8 (eight) hours as needed for severe pain.  Dispense: 6 tablet; Refill: 0 - SUMAtriptan (IMITREX) 100 MG tablet; Take 1 tablet (100 mg total) by mouth every 2 (two) hours as needed for migraine or headache. May repeat in  2 hours if headache persists or recurs.  Dispense: 10 tablet; Refill: 0   [place] Miners. Dyke Brackett, MHS [facility]  10/26/2014

## 2014-10-25 ENCOUNTER — Telehealth: Payer: Self-pay | Admitting: Physician Assistant

## 2014-10-25 DIAGNOSIS — G43901 Migraine, unspecified, not intractable, with status migrainosus: Secondary | ICD-10-CM

## 2014-10-25 NOTE — Telephone Encounter (Signed)
Made referral to neuro for migraine headaches.

## 2014-11-07 ENCOUNTER — Other Ambulatory Visit: Payer: Self-pay | Admitting: Family Medicine

## 2014-11-07 DIAGNOSIS — M546 Pain in thoracic spine: Secondary | ICD-10-CM

## 2014-11-07 DIAGNOSIS — M545 Low back pain: Secondary | ICD-10-CM

## 2014-11-07 DIAGNOSIS — M779 Enthesopathy, unspecified: Secondary | ICD-10-CM

## 2014-11-08 ENCOUNTER — Other Ambulatory Visit (HOSPITAL_BASED_OUTPATIENT_CLINIC_OR_DEPARTMENT_OTHER): Payer: 59

## 2014-11-08 ENCOUNTER — Telehealth: Payer: Self-pay | Admitting: *Deleted

## 2014-11-08 DIAGNOSIS — Z9884 Bariatric surgery status: Secondary | ICD-10-CM

## 2014-11-08 DIAGNOSIS — D509 Iron deficiency anemia, unspecified: Secondary | ICD-10-CM

## 2014-11-08 LAB — CBC & DIFF AND RETIC
BASO%: 0.2 % (ref 0.0–2.0)
Basophils Absolute: 0 10*3/uL (ref 0.0–0.1)
EOS%: 1 % (ref 0.0–7.0)
Eosinophils Absolute: 0.1 10*3/uL (ref 0.0–0.5)
HCT: 35.5 % (ref 34.8–46.6)
HGB: 11.1 g/dL — ABNORMAL LOW (ref 11.6–15.9)
Immature Retic Fract: 5.2 % (ref 1.60–10.00)
LYMPH#: 1.6 10*3/uL (ref 0.9–3.3)
LYMPH%: 34.4 % (ref 14.0–49.7)
MCH: 25.5 pg (ref 25.1–34.0)
MCHC: 31.3 g/dL — AB (ref 31.5–36.0)
MCV: 81.4 fL (ref 79.5–101.0)
MONO#: 0.3 10*3/uL (ref 0.1–0.9)
MONO%: 5.5 % (ref 0.0–14.0)
NEUT#: 2.8 10*3/uL (ref 1.5–6.5)
NEUT%: 58.9 % (ref 38.4–76.8)
Platelets: 345 10*3/uL (ref 145–400)
RBC: 4.36 10*6/uL (ref 3.70–5.45)
RDW: 15.7 % — AB (ref 11.2–14.5)
RETIC %: 1.72 % (ref 0.70–2.10)
RETIC CT ABS: 74.99 10*3/uL (ref 33.70–90.70)
WBC: 4.8 10*3/uL (ref 3.9–10.3)

## 2014-11-08 LAB — IRON AND TIBC CHCC
%SAT: 37 % (ref 21–57)
Iron: 106 ug/dL (ref 41–142)
TIBC: 289 ug/dL (ref 236–444)
UIBC: 183 ug/dL (ref 120–384)

## 2014-11-08 LAB — HOLD TUBE, BLOOD BANK

## 2014-11-08 LAB — FERRITIN CHCC: Ferritin: 224 ng/ml (ref 9–269)

## 2014-11-08 NOTE — Telephone Encounter (Signed)
Pt was seen by Meredith ReiningNicole since this med was orig Rx'd. I wasn't sure if Meredith Reiningicole wanted to keep pt on this med. Pt was referred, but doesn't have appt until 11/26/14. Please advise in Meredith Byrd's absence.

## 2014-11-08 NOTE — Telephone Encounter (Signed)
Yes, please also reschedule labs, appointment to see me and IV iron appointment to 4 months from now Labs to be done 1 week ahead of appt. I need 15 minutes appt with iv iron after, on same day

## 2014-11-08 NOTE — Telephone Encounter (Signed)
LM for rtn call to discuss medication encourage her to RTC.

## 2014-11-08 NOTE — Telephone Encounter (Signed)
Patient called to check the status of the refill request sent by the pharmacy. Advised patient of our refill request time policy and that this request is currently being reviewed by a provider. Please call patient when medication is ready for pick up.

## 2014-11-08 NOTE — Telephone Encounter (Signed)
-----   Message from Liliane BadeKristen L Flinn, RN sent at 11/08/2014 10:51 AM EST ----- Regarding: Lab results Patient called to triage inquiring if lab results from today were back.  Told her iron/ferritin results aren't back yet. She asked if you could call her once the results are back please??  Work #: [id number] Cell #: [phone]  Thanks!  Baxter HireKristen

## 2014-11-08 NOTE — Telephone Encounter (Signed)
Pt requests lab results.  She is scheduled to see Dr. Bertis RuddyGorsuch and IV iron on 2/4.

## 2014-11-08 NOTE — Telephone Encounter (Signed)
Pt states that she is only taking Zanaflex. She has been taking it BID. She states that she is not able to go without this medication due to the bone spurs in her back. She is unable to find comfort in any position. She does have an appt with a rheumatologist however she does not think this will cure anything.  Please advise.

## 2014-11-08 NOTE — Telephone Encounter (Signed)
Informed pt of lab results are good.  No need to keep MD and iron appt next week as scheduled.  We will schedule her back in 4 months w/ lab one week prior to MD visit.  Pt verbalized understanding and ok w/ this plan.

## 2014-11-08 NOTE — Telephone Encounter (Signed)
I would think for bone spurs she would need an orthopedist not a rheumatologist.  Please let her know this and do a referral if need be.

## 2014-11-08 NOTE — Telephone Encounter (Signed)
Patient called inquiring about lab results from today. Told patient results are not back yet. She asked if Dr. Maxine GlennGorsuch's nurse could call her once results are back. Inbox message sent to Cameo, RN to followup once results are back.

## 2014-11-08 NOTE — Telephone Encounter (Signed)
Dr Alwyn RenHopper Rxd along with Fioricet on 10/13/14 to "help with both back pain and migraines".

## 2014-11-08 NOTE — Telephone Encounter (Signed)
If she has taken 40 zanaflex in 20 days - we need to try and get her more help for her headaches - I have sent a refill to the pharmacy if she does not know that she had a refill for that medication from the original Rx - if she has used both a recheck might be best

## 2014-11-08 NOTE — Telephone Encounter (Signed)
It looks like patient has been given robaxin after the zanaflex - what are these medications used for

## 2014-11-09 ENCOUNTER — Telehealth: Payer: Self-pay | Admitting: Hematology and Oncology

## 2014-11-09 NOTE — Telephone Encounter (Signed)
Notified pt of RF and she agreed to consult ortho about her bone spurs in back. I will put in order.

## 2014-11-09 NOTE — Telephone Encounter (Signed)
s.w pt and advised on May appt.....pt ok and aware °

## 2014-11-15 ENCOUNTER — Ambulatory Visit: Payer: 59

## 2014-11-15 ENCOUNTER — Ambulatory Visit: Payer: 59 | Admitting: Hematology and Oncology

## 2014-11-23 ENCOUNTER — Encounter: Payer: Self-pay | Admitting: Neurology

## 2014-11-23 ENCOUNTER — Institutional Professional Consult (permissible substitution): Payer: 59 | Admitting: Neurology

## 2014-11-23 ENCOUNTER — Ambulatory Visit (INDEPENDENT_AMBULATORY_CARE_PROVIDER_SITE_OTHER): Payer: 59 | Admitting: Neurology

## 2014-11-23 ENCOUNTER — Telehealth: Payer: Self-pay | Admitting: Neurology

## 2014-11-23 VITALS — BP 122/82 | HR 72 | Ht 58.5 in | Wt 213.0 lb

## 2014-11-23 DIAGNOSIS — G43019 Migraine without aura, intractable, without status migrainosus: Secondary | ICD-10-CM

## 2014-11-23 MED ORDER — TOPIRAMATE 100 MG PO TABS: 100.0000 mg | ORAL_TABLET | Freq: Two times a day (BID) | ORAL | Status: DC

## 2014-11-23 MED ORDER — ZOLMITRIPTAN 5 MG NA SOLN: 1.0000 | NASAL | Status: DC | PRN

## 2014-11-23 MED ORDER — BUPIVACAINE HCL (PF) 0.5 % IJ SOLN: 10.0000 mL | Freq: Once | INTRAMUSCULAR | Status: DC

## 2014-11-23 NOTE — Telephone Encounter (Signed)
Pt scheduled to see [facility] today and our office on Monday. Susie spoke with tp and pt confirmed he would not be coming here on Monday. She has cancelled our appt on Monday. Referring provider office notified via EP[facility] referral / Sherri S.

## 2014-11-23 NOTE — Progress Notes (Signed)
PATIENT: Meredith Byrd DOB: 09/10/1981  HISTORICAL  Meredith Byrd is 34 years old right-handed African-American female, is referred by her primary care physician nurse practitioner Marva Panda for evaluation of migraine headaches.  She had long-standing history of migraine since elementary school, her typical migraine are lateralized severe pounding headache with associated light noise sensitivity, nauseous, Lasting for few days, even after few weeks. She had a history of gastric bypass, lost 60 pounds in 2008, her weight has been stable  I saw her previously, while taking Topamax, her headache is much less frequent, less severe, but she has not take any preventive medications since 2014.  She now complains of increased headache frequency, since 2014, she is having 3-4 typical migraine headaches each week, each episode last for a few days, in January, she has one severe episode of migraine lasting for 2 weeks. She was not able to identify triggers, she has tried triptan treatment in the past, Imitrex, cause heart racing, tightness, Maxalt, similar side effect, without helping her headache much.  Over the past 18 months, she was given Stadol nasal spray for headaches, she gets 3 bottles of 10 mg Stadol nasal spray each month, use frequently, but always has recurrent headaches shortly afterwards.  REVIEW OF SYSTEMS: Full 14 system review of systems performed and notable only for headache, anemia, blurred vision, fatigue   ALLERGIES: No Known Allergies  HOME MEDICATIONS: Current Outpatient Prescriptions  Medication Sig Dispense Refill  . alosetron (LOTRONEX) 1 MG tablet Take 2 mg by mouth daily.    . butorphanol (STADOL) 10 MG/ML nasal spray Place 1 spray into the nose every 4 (four) hours as needed for headache.    . clidinium-chlordiazePOXIDE (LIBRAX) 5-2.5 MG per capsule 2 capsules 4 times daily. 60 capsule 3  . Cyanocobalamin (VITAMIN B-12 IJ) Inject 1  Applicatorful as directed every 30 (thirty) days.     [name] gabapentin (NEURONTIN) 300 MG capsule Take 300 mg by mouth 3 (three) times daily.    [name] losartan-hydrochlorothiazide (HYZAAR) 50-12.5 MG per tablet Take 1 tablet by mouth daily.    . MedroxyPROGESTERone Acetate (DEPO-PROVERA IM) Inject 1 Applicatorful into the muscle every 3 (three) months.     [name] omeprazole (PRILOSEC) 40 MG capsule Take 40 mg by mouth daily.    . traZODone (DESYREL) 50 MG tablet Take 150 mg by mouth at bedtime as needed for sleep.     . Vitamin D, Ergocalciferol, (DRISDOL) 50000 UNITS CAPS capsule Take 50,000 Units by mouth every 7 (seven) days.       PAST MEDICAL HISTORY: Past Medical History  Diagnosis Date  . Allergy   . Anemia   . GERD (gastroesophageal reflux disease)   . Hypertension   . Migraines   . Palpitations   . Hx of echocardiogram     The echocadiogram was essentially normal with the exception of mild mitral calcification and borderline concentric LVH, which in the setting of her hypertension at this early age is something that mean her blood pressure is well controlled.   [name] History of stress test 02/2011 ([facility])    there was no evidence of ischemia, but she only went 3 1/2 minutes on the treadmill making it very difficult to get a good accurate assessment, however  . Iron deficiency anemia, unspecified 07/09/2014  . Arthritis pain 08/20/2014    PAST SURGICAL HISTORY: Past Surgical History  Procedure Laterality Date  . Cholecystectomy    . Breast surgery    . Gastric  bypass  2008  . Oophorectomy Right 2010    FAMILY HISTORY: Family History  Problem Relation Age of Onset  . Asthma Mother   . Heart disease Mother   . Diabetes Mother   . Early death Mother   . Hypertension Mother   . Stroke Maternal Grandmother   . Heart disease Maternal Grandmother   . Hypertension Maternal Grandmother   . Hyperlipidemia Maternal Grandmother     SOCIAL HISTORY:  History   Social History  . Marital  Status: Married    Spouse Name: Meredith Byrd  . Number of Children: 4  . Years of Education: Associates   Occupational History  . Full time student    Social History Main Topics  . Smoking status: Never Smoker   . Smokeless tobacco: Never Used  . Alcohol Use: No  . Drug Use: No  . Sexual Activity: Yes   Other Topics Concern  . Not on file   Social History Narrative   Right handed.   Lives at home with husband and four children.   Occasional caffeine use.     PHYSICAL EXAM   Filed Vitals:   11/23/14 0853  BP: 122/82  Pulse: 72  Height: 4' 10.5" (1.486 m)  Weight: 213 lb (96.616 kg)    Not recorded      Body mass index is 43.75 kg/(m^2).   Generalized: In no acute distress  Neck: Supple, no carotid bruits   Cardiac: Regular rate rhythm  Pulmonary: Clear to auscultation bilaterally  Musculoskeletal: No deformity  Neurological examination  Mentation: Alert oriented to time, place, history taking, and causual conversation  Cranial nerve II-XII: Pupils were equal round reactive to light. Extraocular movements were full.  Visual field were full on confrontational test. Bilateral fundi were sharp.  Facial sensation and strength were normal. Hearing was intact to finger rubbing bilaterally. Uvula tongue midline.  Head turning and shoulder shrug and were normal and symmetric.Tongue protrusion into cheek strength was normal.  Motor: Normal tone, bulk and strength.  Sensory: Intact to fine touch, pinprick, preserved vibratory sensation, and proprioception at toes.  Coordination: Normal finger to nose, heel-to-shin bilaterally there was no truncal ataxia  Gait: Rising up from seated position without assistance, normal stance, without trunk ataxia, moderate stride, good arm swing, smooth turning, able to perform tiptoe, and heel walking without difficulty.   Romberg signs: Negative  Deep tendon reflexes: Brachioradialis 2/2, biceps 2/2, triceps 2/2, patellar 2/2,  Achilles 2/2, plantar responses were flexor bilaterally.   DIAGNOSTIC DATA (LABS, IMAGING, TESTING) - I reviewed patient records, labs, notes, testing and imaging myself where available.  Lab Results  Component Value Date   WBC 4.8 11/08/2014   HGB 11.1* 11/08/2014   HCT 35.5 11/08/2014   MCV 81.4 11/08/2014   PLT 345 11/08/2014      Component Value Date/Time   NA 141 06/07/2014 0359   K 3.9 06/07/2014 0359   CL 104 06/07/2014 0359   CO2 23 06/07/2014 0359   GLUCOSE 84 06/07/2014 0359   BUN 8 06/07/2014 0359   CREATININE 0.60 06/07/2014 0359   CREATININE 0.65 03/22/2014 1217   CALCIUM 8.7 06/07/2014 0359   PROT 6.6 06/07/2014 0359   ALBUMIN 3.3* 06/07/2014 0359   AST 13 06/07/2014 0359   ALT 11 06/07/2014 0359   ALKPHOS 56 06/07/2014 0359   BILITOT 0.3 06/07/2014 0359   GFRNONAA >90 06/07/2014 0359   GFRNONAA >89 03/22/2014 1217   GFRAA >90 06/07/2014 0359   GFRAA >89  03/22/2014 1217   Lab Results  Component Value Date   CHOL 108 03/22/2014   HDL 44 03/22/2014   LDLCALC 54 03/22/2014   TRIG 51 03/22/2014   CHOLHDL 2.5 03/22/2014   No results found for: HGBA1C Lab Results  Component Value Date   VITAMINB12 315 06/07/2014   Lab Results  Component Value Date   TSH 0.828 06/06/2014      ASSESSMENT AND PLAN  Stephana ARLEY GARANT is a 34 y.o. female  with long-standing history of frequent migraine headaches, normal neurological examinations, 1, add on preventive medications Topamax, titrating to 100 mg bid. 2. Zomig nasal spray.  No orders of the defined types were placed in this encounter.    New Prescriptions   TOPIRAMATE (TOPAMAX) 100 MG TABLET    Take 1 tablet (100 mg total) by mouth 2 (two) times daily.   ZOLMITRIPTAN (ZOMIG) 5 MG NASAL SOLUTION    Place 1 spray into the nose as needed for migraine.    Return in about 1 month (around 12/22/2014).  Levert Feinstein, M.D. Ph.D. [facility] [address] [place], [place]  [address] Ph: [phone] Fax: [phone]

## 2014-11-23 NOTE — Progress Notes (Signed)
  34 years old female, with history of gastric bypass surgery, long-standing history of migraines, presenting with frequent, intractable migraine, this particular migraine has been lasting for 2 days, failed to improve by multiple dose of Stadol nasal spray.  Bupivicaine injection protocol for headache  Bupivacaine 0.5% was injected on the scalp bilaterally at several locations:  -On the occipital area of the head, 3 injections each side, 1 cc per injection at the midpoint between the mastoid process and the occipital protuberance. 2 other injections were done one finger breadth from the initial injection, one at a 10 o'clock position and the other at a 2 o'clock position.  -2 injections of 0.5 cc were done in the temporal regions, 2 fingerbreadths above the tragus of the ear, with the second injection one fingerbreadth posteriorly to the first  -1 injection each side of 0.5 cc was done anterior to the tragus of the ear for a trigeminal ganglion injection  No complications of the procedure were noted. Injections were made with a 27-gauge needle. She could not tolerate more injection.  NDC number is [phone]. Lot Number [id number]

## 2014-11-26 ENCOUNTER — Ambulatory Visit: Payer: 59 | Admitting: Neurology

## 2014-12-25 ENCOUNTER — Telehealth: Payer: Self-pay | Admitting: Neurology

## 2014-12-25 NOTE — Telephone Encounter (Signed)
Meredith Byrd: Let patient know, Topamax  is Category D medications, she should not take it while trying to conceive. If she has more questions about medication choices, give her a follow up visit on next available.  Rating D  There is positive evidence of human fetal risk, but the benefits from use in pregnant women may be acceptable despite the risk (e.g., if the drug is needed in a life-threatening situation or for a serious disease for which safer drugs cannot be used or are ineffective).

## 2014-12-25 NOTE — Telephone Encounter (Signed)
Pt aware - she will keep her follow up appt on 01/03/15 to further discuss.

## 2014-12-25 NOTE — Telephone Encounter (Signed)
Patient stated she's trying to get pregnant and questioning if Rx topiramate (TOPAMAX) 100 MG tablet is safe while trying to conceive.  Also questioning if Rx could hinder her from conceiving.  Please call and advise.

## 2015-01-03 ENCOUNTER — Ambulatory Visit (INDEPENDENT_AMBULATORY_CARE_PROVIDER_SITE_OTHER): Payer: 59 | Admitting: Neurology

## 2015-01-03 ENCOUNTER — Encounter: Payer: Self-pay | Admitting: Neurology

## 2015-01-03 VITALS — BP 116/84 | HR 78 | Resp 14 | Ht 58.5 in | Wt 212.0 lb

## 2015-01-03 DIAGNOSIS — G43019 Migraine without aura, intractable, without status migrainosus: Secondary | ICD-10-CM | POA: Diagnosis not present

## 2015-01-03 MED ORDER — BUTALBITAL-APAP-CAFFEINE 50-325-40 MG PO TABS: 1.0000 | ORAL_TABLET | Freq: Four times a day (QID) | ORAL | Status: DC | PRN

## 2015-01-03 NOTE — Progress Notes (Signed)
PATIENT: Meredith Byrd DOB: 16-Apr-1981  HISTORICAL  Meredith Byrd is 34 years old right-handed African-American female, is referred by her primary care physician nurse practitioner Marva Panda for evaluation of migraine headaches.  She had long-standing history of migraine, since elementary school, her typical migraine are lateralized severe pounding headache with associated light noise sensitivity, nauseous, Lasting for few days, even after few weeks. She had a history of gastric bypass, lost 60 pounds in 2008, her weight has been stable  I saw her previously, while taking Topamax, her headache is much less frequent, less severe, but she has not take any preventive medications since 2014.  She now complains of increased headache frequency, since 2014, she is having 3-4 typical migraine headaches each week, each episode last for a few days, in January, she has one severe episode of migraine lasting for 2 weeks. She was not able to identify triggers, she has tried triptan treatment in the past, Imitrex, cause heart racing, tightness, Maxalt, similar side effect, without helping her headache much.  Over the past 18 months, she was given Stadol nasal spray for headaches, she gets 3 bottles of 10 mg Stadol nasal spray each month, use frequently, but always has recurrent headaches shortly afterwards.  UPDATE March 24th 2016: She reported only temporary partial relief with previous nerve block in February twelfth 2016, she had tried Zomig nasal spray 10 times, without helping her headaches, Previously Preventive medications, Topamax works for her headaches, but she is trying to conceiving again, does not want to take too much medications, I have went over other possibilities including Inderal, nortriptyline, all belongs to category C, I also advised her avoiding triptan, and Stadol treatment during pregnancy,  She had 4 children, no significant headache during her previous  pregnancy  REVIEW OF SYSTEMS: Full 14 system review of systems performed and notable only for headache, anemia, blurred vision, fatigue , sensitivity ALLERGIES: No Known Allergies  HOME MEDICATIONS: Current Outpatient Prescriptions  Medication Sig Dispense Refill  . alosetron (LOTRONEX) 1 MG tablet Take 2 mg by mouth daily.    . butorphanol (STADOL) 10 MG/ML nasal spray Place 1 spray into the nose every 4 (four) hours as needed for headache.    . clidinium-chlordiazePOXIDE (LIBRAX) 5-2.5 MG per capsule 2 capsules 4 times daily. 60 capsule 3  . Cyanocobalamin (VITAMIN B-12 IJ) Inject 1 Applicatorful as directed every 30 (thirty) days.     [name] gabapentin (NEURONTIN) 300 MG capsule Take 300 mg by mouth 3 (three) times daily.    [name] losartan-hydrochlorothiazide (HYZAAR) 50-12.5 MG per tablet Take 1 tablet by mouth daily.    . MedroxyPROGESTERone Acetate (DEPO-PROVERA IM) Inject 1 Applicatorful into the muscle every 3 (three) months.     [name] omeprazole (PRILOSEC) 40 MG capsule Take 40 mg by mouth daily.    . traZODone (DESYREL) 50 MG tablet Take 150 mg by mouth at bedtime as needed for sleep.     . Vitamin D, Ergocalciferol, (DRISDOL) 50000 UNITS CAPS capsule Take 50,000 Units by mouth every 7 (seven) days.       PAST MEDICAL HISTORY: Past Medical History  Diagnosis Date  . Allergy   . Anemia   . GERD (gastroesophageal reflux disease)   . Hypertension   . Migraines   . Palpitations   . Hx of echocardiogram     The echocadiogram was essentially normal with the exception of mild mitral calcification and borderline concentric LVH, which in the setting of her hypertension at  this early age is something that mean her blood pressure is well controlled.   [name] History of stress test 02/2011 ([facility])    there was no evidence of ischemia, but she only went 3 1/2 minutes on the treadmill making it very difficult to get a good accurate assessment, however  . Iron deficiency anemia, unspecified 07/09/2014  .  Arthritis pain 08/20/2014    PAST SURGICAL HISTORY: Past Surgical History  Procedure Laterality Date  . Cholecystectomy    . Breast surgery    . Gastric bypass  2008  . Oophorectomy Right 2010    FAMILY HISTORY: Family History  Problem Relation Age of Onset  . Asthma Mother   . Heart disease Mother   . Diabetes Mother   . Early death Mother   . Hypertension Mother   . Stroke Maternal Grandmother   . Heart disease Maternal Grandmother   . Hypertension Maternal Grandmother   . Hyperlipidemia Maternal Grandmother     SOCIAL HISTORY:  History   Social History  . Marital Status: Married    Spouse Name: Meredith Byrd  . Number of Children: 4  . Years of Education: Associates   Occupational History  . Full time student    Social History Main Topics  . Smoking status: Never Smoker   . Smokeless tobacco: Never Used  . Alcohol Use: No  . Drug Use: No  . Sexual Activity: Yes   Other Topics Concern  . Not on file   Social History Narrative   Right handed.   Lives at home with husband and four children.   Occasional caffeine use.     PHYSICAL EXAM   Filed Vitals:   01/03/15 1454  BP: 116/84  Pulse: 78  Resp: 14  Height: 4' 10.5" (1.486 m)  Weight: 212 lb (96.163 kg)    Not recorded      Body mass index is 43.55 kg/(m^2).  PHYSICAL EXAMNIATION:  Gen: NAD, conversant, well nourised, obese, well groomed                     Cardiovascular: Regular rate rhythm, no peripheral edema, warm, nontender. Eyes: Conjunctivae clear without exudates or hemorrhage Neck: Supple, no carotid bruise. Pulmonary: Clear to auscultation bilaterally   NEUROLOGICAL EXAM:  MENTAL STATUS: Speech:    Speech is normal; fluent and spontaneous with normal comprehension.  Cognition:    The patient is oriented to person, place, and time;     recent and remote memory intact;     language fluent;     normal attention, concentration,     fund of knowledge.  CRANIAL NERVES: CN II:  Visual fields are full to confrontation. Fundoscopic exam is normal with sharp discs and no vascular changes. Venous pulsations are present bilaterally. Pupils are 4 mm and briskly reactive to light. Visual acuity is 20/20 bilaterally. CN III, IV, VI: extraocular movement are normal. No ptosis. CN V: Facial sensation is intact to pinprick in all 3 divisions bilaterally. Corneal responses are intact.  CN VII: Face is symmetric with normal eye closure and smile. CN VIII: Hearing is normal to rubbing fingers CN IX, X: Palate elevates symmetrically. Phonation is normal. CN XI: Head turning and shoulder shrug are intact CN XII: Tongue is midline with normal movements and no atrophy.  MOTOR: There is no pronator drift of out-stretched arms. Muscle bulk and tone are normal. Muscle strength is normal.   Shoulder abduction Shoulder external rotation Elbow flexion Elbow extension Wrist flexion  Wrist extension Finger abduction Hip flexion Knee flexion Knee extension Ankle dorsi flexion Ankle plantar flexion  R 5 5 5 5 5 5 5 5 5 5 5 5   L 5 5 5 5 5 5 5 5 5 5 5 5     REFLEXES: Reflexes are 2+ and symmetric at the biceps, triceps, knees, and ankles. Plantar responses are flexor.  SENSORY: Light touch, pinprick, position sense, and vibration sense are intact in fingers and toes.  COORDINATION: Rapid alternating movements and fine finger movements are intact. There is no dysmetria on finger-to-nose and heel-knee-shin. There are no abnormal or extraneous movements.   GAIT/STANCE: Posture is normal. Gait is steady with normal steps, base, arm swing, and turning. Heel and toe walking are normal. Tandem gait is normal.  Romberg is absent.     DIAGNOSTIC DATA (LABS, IMAGING, TESTING) - I reviewed patient records, labs, notes, testing and imaging myself where available.  Lab Results  Component Value Date   WBC 4.8 11/08/2014   HGB 11.1* 11/08/2014   HCT 35.5 11/08/2014   MCV 81.4 11/08/2014   PLT  345 11/08/2014      Component Value Date/Time   NA 141 06/07/2014 0359   K 3.9 06/07/2014 0359   CL 104 06/07/2014 0359   CO2 23 06/07/2014 0359   GLUCOSE 84 06/07/2014 0359   BUN 8 06/07/2014 0359   CREATININE 0.60 06/07/2014 0359   CREATININE 0.65 03/22/2014 1217   CALCIUM 8.7 06/07/2014 0359   PROT 6.6 06/07/2014 0359   ALBUMIN 3.3* 06/07/2014 0359   AST 13 06/07/2014 0359   ALT 11 06/07/2014 0359   ALKPHOS 56 06/07/2014 0359   BILITOT 0.3 06/07/2014 0359   GFRNONAA >90 06/07/2014 0359   GFRNONAA >89 03/22/2014 1217   GFRAA >90 06/07/2014 0359   GFRAA >89 03/22/2014 1217   Lab Results  Component Value Date   CHOL 108 03/22/2014   HDL 44 03/22/2014   LDLCALC 54 03/22/2014   TRIG 51 03/22/2014   CHOLHDL 2.5 03/22/2014   No results found for: HGBA1C Lab Results  Component Value Date   VITAMINB12 315 06/07/2014   Lab Results  Component Value Date   TSH 0.828 06/06/2014      ASSESSMENT AND PLAN  Alexsys Mcneil SoberM Crookshanks is a 34 y.o. female  with long-standing history of frequent migraine headaches, normal neurological examinations, she is planning on conceiving, does not want to take much of preventive medications  1, I have suggested magnesium oxide 400 mg twice a day, riboflavin 100 mg twice a day 2, she should also avoid triptan, Stadol nasal spray, which belonged to category C, Fioricet as needed, 10 tablets each months,with 3 refills, 3. Return to clinic as needed  Levert FeinsteinYijun Letica Giaimo, M.D. Ph.D. [facility] [address], [address] [place], [place] [address] Ph: [phone] Fax: [phone]

## 2015-01-03 NOTE — Patient Instructions (Signed)
Potential preventive medications   Beta blocker inderal Antiepileptic medications,  Nortriptyline  But above medications pronounced to pregnancy category C,or D.  It is safe to use magnesium oxide, 400 mg twice a day, riboflavin 100 mg twice a day,   Tried to avoid triptan, such as Zomig nasal spray if you do get pregnant,

## 2015-01-29 ENCOUNTER — Ambulatory Visit (INDEPENDENT_AMBULATORY_CARE_PROVIDER_SITE_OTHER): Payer: 59 | Admitting: Internal Medicine

## 2015-01-29 VITALS — BP 112/90 | HR 68 | Temp 98.1°F | Resp 18 | Ht 60.75 in | Wt 219.0 lb

## 2015-01-29 DIAGNOSIS — J0100 Acute maxillary sinusitis, unspecified: Secondary | ICD-10-CM | POA: Diagnosis not present

## 2015-01-29 DIAGNOSIS — R05 Cough: Secondary | ICD-10-CM | POA: Diagnosis not present

## 2015-01-29 DIAGNOSIS — R059 Cough, unspecified: Secondary | ICD-10-CM

## 2015-01-29 MED ORDER — HYDROCODONE-HOMATROPINE 5-1.5 MG/5ML PO SYRP: 5.0000 mL | ORAL_SOLUTION | Freq: Four times a day (QID) | ORAL | Status: DC | PRN

## 2015-01-29 MED ORDER — AMOXICILLIN 875 MG PO TABS: 875.0000 mg | ORAL_TABLET | Freq: Two times a day (BID) | ORAL | Status: DC

## 2015-01-29 NOTE — Progress Notes (Signed)
Subjective:    Patient ID: Meredith Byrd, female    DOB: 05-14-1981, 34 y.o.   MRN: [id number]  This chart was scribed for Ellamae Sia, MD by Murriel Hopper, ED Scribe. The patient's care was started at 6:24 PM.   HPI  HPI Comments: Meredith Byrd is a 34 y.o. female who presents to [facility] complaining of a constant, worsening sore throat with associated sinus congestion, trouble swallowing, and cough that has been present for four days. Pt notes that her symptoms began with a sore throat four days ago, and worsened as she developed sinus congestion and a cough over the next couple of days. Pt notes that her cough has been waking her up at night. Pt states that her ears have been popping a lot as well. Pt denies asthma or any other breathing problems in the past.     Past Medical History  Diagnosis Date  . Allergy   . Anemia   . GERD (gastroesophageal reflux disease)   . Hypertension   . Migraines   . Palpitations   . Hx of echocardiogram     The echocadiogram was essentially normal with the exception of mild mitral calcification and borderline concentric LVH, which in the setting of her hypertension at this early age is something that mean her blood pressure is well controlled.   [name] History of stress test 02/2011 ([facility])    there was no evidence of ischemia, but she only went 3 1/2 minutes on the treadmill making it very difficult to get a good accurate assessment, however  . Iron deficiency anemia, unspecified 07/09/2014  . Arthritis pain 08/20/2014   Prior to Admission medications   Medication Sig Start Date End Date Taking? Authorizing Provider  alosetron (LOTRONEX) 1 MG tablet Take 2 mg by mouth daily.   Yes Historical Provider, MD  butalbital-acetaminophen-caffeine (FIORICET, ESGIC) 50-325-40 MG per tablet Take 1 tablet by mouth every 6 (six) hours as needed for headache. 01/03/15  Yes Levert Feinstein, MD  clidinium-chlordiazePOXIDE (LIBRAX) 5-2.5  MG per capsule 2 capsules 4 times daily. 03/22/14  Yes Tommie Sams, DO  Cyanocobalamin (VITAMIN B-12 IJ) Inject 1 Applicatorful as directed every 30 (thirty) days.    Yes Historical Provider, MD  losartan-hydrochlorothiazide (HYZAAR) 50-12.5 MG per tablet Take 1 tablet by mouth daily.   Yes Historical Provider, MD  omeprazole (PRILOSEC) 40 MG capsule Take 40 mg by mouth daily.   Yes Historical Provider, MD  traZODone (DESYREL) 50 MG tablet Take 150 mg by mouth at bedtime as needed. 03/22/14  Yes Tommie Sams, DO  Vitamin D, Ergocalciferol, (DRISDOL) 50000 UNITS CAPS capsule Take 50,000 Units by mouth every 7 (seven) days.   Yes Historical Provider, MD   No Known Allergies    Review of Systems  HENT: Positive for congestion, sore throat and trouble swallowing.   Respiratory: Positive for cough.        Objective:   Physical Exam  Constitutional: She is oriented to person, place, and time. She appears well-developed and well-nourished.  HENT:  Head: Normocephalic and atraumatic.  Nose: purulent discharge  Throat: red without exudate  Cardiovascular: Normal rate.   Pulmonary/Chest: Effort normal.  Abdominal: She exhibits no distension.  Neurological: She is alert and oriented to person, place, and time.  Skin: Skin is warm and dry.  Psychiatric: She has a normal mood and affect.  Nursing note and vitals reviewed.      Assessment & Plan:  Acute maxillary sinusitis with cough secondary  Meds ordered this encounter  Medications  . HYDROcodone-homatropine (HYCODAN) 5-1.5 MG/5ML syrup    Sig: Take 5 mLs by mouth every 6 (six) hours as needed.    Dispense:  120 mL    Refill:  0  . amoxicillin (AMOXIL) 875 MG tablet    Sig: Take 1 tablet (875 mg total) by mouth 2 (two) times daily.    Dispense:  20 tablet    Refill:  0   Sudafed if desired  I have completed the patient encounter in its entirety as documented by the scribe, with editing by me where necessary. Whitaker Holderman P.  Merla Richesoolittle, M.D.

## 2015-02-04 ENCOUNTER — Telehealth: Payer: Self-pay

## 2015-02-04 NOTE — Telephone Encounter (Signed)
Pt was given a written script for some cough medicine and would like to have another round since her cough haven't completely gone away Please call [phone]

## 2015-02-05 MED ORDER — HYDROCODONE-HOMATROPINE 5-1.5 MG/5ML PO SYRP: 5.0000 mL | ORAL_SOLUTION | Freq: Four times a day (QID) | ORAL | Status: DC | PRN

## 2015-02-06 NOTE — Telephone Encounter (Signed)
Pt.notified

## 2015-02-21 ENCOUNTER — Other Ambulatory Visit: Payer: 59

## 2015-02-21 ENCOUNTER — Other Ambulatory Visit: Payer: Self-pay | Admitting: Hematology and Oncology

## 2015-02-21 ENCOUNTER — Telehealth: Payer: Self-pay | Admitting: Hematology and Oncology

## 2015-02-21 DIAGNOSIS — D509 Iron deficiency anemia, unspecified: Secondary | ICD-10-CM

## 2015-02-21 NOTE — Telephone Encounter (Signed)
pt called to r/s appts...done....pt aware of all new dates

## 2015-02-21 NOTE — Telephone Encounter (Signed)
returned call and lvm for pt regarding to calling back to r/s

## 2015-02-28 ENCOUNTER — Ambulatory Visit: Payer: 59

## 2015-02-28 ENCOUNTER — Ambulatory Visit: Payer: 59 | Admitting: Hematology and Oncology

## 2015-03-07 ENCOUNTER — Ambulatory Visit: Payer: 59

## 2015-03-22 ENCOUNTER — Other Ambulatory Visit: Payer: Self-pay | Admitting: *Deleted

## 2015-03-22 ENCOUNTER — Telehealth: Payer: Self-pay | Admitting: *Deleted

## 2015-03-22 NOTE — Telephone Encounter (Signed)
Left message for pt to see if she can come in on Monday at 1245 for labs and see Dr Bertis Ruddy at 1:15. Requested call back.

## 2015-03-25 ENCOUNTER — Ambulatory Visit: Payer: 59

## 2015-03-25 ENCOUNTER — Ambulatory Visit: Payer: 59 | Admitting: Hematology and Oncology

## 2015-03-25 ENCOUNTER — Ambulatory Visit (HOSPITAL_BASED_OUTPATIENT_CLINIC_OR_DEPARTMENT_OTHER): Payer: 59 | Admitting: Hematology and Oncology

## 2015-03-25 ENCOUNTER — Encounter: Payer: Self-pay | Admitting: Hematology and Oncology

## 2015-03-25 ENCOUNTER — Telehealth: Payer: Self-pay | Admitting: Hematology and Oncology

## 2015-03-25 ENCOUNTER — Other Ambulatory Visit: Payer: 59

## 2015-03-25 ENCOUNTER — Other Ambulatory Visit (HOSPITAL_BASED_OUTPATIENT_CLINIC_OR_DEPARTMENT_OTHER): Payer: 59

## 2015-03-25 VITALS — BP 129/84 | HR 92 | Temp 98.0°F | Resp 18 | Ht 60.75 in | Wt 213.7 lb

## 2015-03-25 DIAGNOSIS — D563 Thalassemia minor: Secondary | ICD-10-CM | POA: Diagnosis not present

## 2015-03-25 DIAGNOSIS — D509 Iron deficiency anemia, unspecified: Secondary | ICD-10-CM | POA: Diagnosis not present

## 2015-03-25 DIAGNOSIS — Z9884 Bariatric surgery status: Secondary | ICD-10-CM

## 2015-03-25 LAB — CBC & DIFF AND RETIC
BASO%: 0.1 % (ref 0.0–2.0)
BASOS ABS: 0 10*3/uL (ref 0.0–0.1)
EOS%: 0.7 % (ref 0.0–7.0)
Eosinophils Absolute: 0.1 10*3/uL (ref 0.0–0.5)
HCT: 38.6 % (ref 34.8–46.6)
HEMOGLOBIN: 12.5 g/dL (ref 11.6–15.9)
Immature Retic Fract: 4.9 % (ref 1.60–10.00)
LYMPH#: 1.9 10*3/uL (ref 0.9–3.3)
LYMPH%: 27.5 % (ref 14.0–49.7)
MCH: 25 pg — AB (ref 25.1–34.0)
MCHC: 32.4 g/dL (ref 31.5–36.0)
MCV: 77.2 fL — AB (ref 79.5–101.0)
MONO#: 0.3 10*3/uL (ref 0.1–0.9)
MONO%: 3.6 % (ref 0.0–14.0)
NEUT#: 4.8 10*3/uL (ref 1.5–6.5)
NEUT%: 68.1 % (ref 38.4–76.8)
Platelets: 419 10*3/uL — ABNORMAL HIGH (ref 145–400)
RBC: 5 10*6/uL (ref 3.70–5.45)
RDW: 14.6 % — ABNORMAL HIGH (ref 11.2–14.5)
RETIC %: 1.37 % (ref 0.70–2.10)
Retic Ct Abs: 68.5 10*3/uL (ref 33.70–90.70)
WBC: 7 10*3/uL (ref 3.9–10.3)

## 2015-03-25 LAB — FERRITIN CHCC: FERRITIN: 219 ng/mL (ref 9–269)

## 2015-03-25 NOTE — Telephone Encounter (Signed)
Appointments made and patient will get a new avs in chemo today °

## 2015-03-25 NOTE — Assessment & Plan Note (Signed)
This patient had severe iron deficiency anemia requiring iron infusion. She is currently not anemic. There is no indication to give her iron infusion unless her ferritin is less than 50.

## 2015-03-26 NOTE — Assessment & Plan Note (Signed)
She is made aware that she the thalassemia trait could cause her to look anemic. In any case, I also discussed with her implication of inheritance to her children. The present time, she does not need any further workup. Her children does not need to be tested at this point in time.

## 2015-03-26 NOTE — Progress Notes (Signed)
[facility] OFFICE PROGRESS NOTE  Millsaps, Meredith Millin, NP SUMMARY OF HEMATOLOGIC HISTORY: She had history of gastric bypass surgery in 2008. Recently, she complained of profound fatigue, chest pain, shortness of breath and she passed out. She was hospitalized from 06/05/2014 2028 2015. She received 2 units of blood transfusion and intravenous iron infusion. She never donated blood. The patient was prescribed oral iron supplements but she could not tolerate oral iron. In August 2015, she received 1 dose of intravenous iron. 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 female returns for further follow-up. She complained of fatigue. She denies pica. The patient denies any recent signs or symptoms of bleeding such as spontaneous epistaxis, hematuria or hematochezia.   I have reviewed the past medical history, past surgical history, social history and family history with the patient and they are unchanged from previous note.  ALLERGIES:  has No Known Allergies.  MEDICATIONS:  Current Outpatient Prescriptions  Medication Sig Dispense Refill  . alosetron (LOTRONEX) 1 MG tablet Take 2 mg by mouth daily.    . butalbital-acetaminophen-caffeine (FIORICET, ESGIC) 50-325-40 MG per tablet Take 1 tablet by mouth every 6 (six) hours as needed for headache. 10 tablet 3  . clidinium-chlordiazePOXIDE (LIBRAX) 5-2.5 MG per capsule 2 capsules 4 times daily. 60 capsule 3  . Cyanocobalamin (VITAMIN B-12 IJ) Inject 1 Applicatorful as directed every 30 (thirty) days.     [name] HYDROcodone-homatropine (HYCODAN) 5-1.5 MG/5ML syrup Take 5 mLs by mouth every 6 (six) hours as needed. 120 mL 0  . losartan-hydrochlorothiazide (HYZAAR) 50-12.5 MG per tablet Take 1 tablet by mouth daily.    [name] omeprazole (PRILOSEC) 40 MG capsule Take 40 mg by mouth daily.    . traZODone (DESYREL) 50 MG tablet Take 150 mg by mouth at bedtime as needed.    .  Vitamin D, Ergocalciferol, (DRISDOL) 50000 UNITS CAPS capsule Take 50,000 Units by mouth every 7 (seven) days.     No current facility-administered medications for this visit.     REVIEW OF SYSTEMS:   Constitutional: Denies fevers, chills or night sweats Eyes: Denies blurriness of vision Ears, nose, mouth, throat, and face: Denies mucositis or sore throat Respiratory: Denies cough, dyspnea or wheezes Cardiovascular: Denies palpitation, chest discomfort or lower extremity swelling Gastrointestinal:  Denies nausea, heartburn or change in bowel habits Skin: Denies abnormal skin rashes Lymphatics: Denies new lymphadenopathy or easy bruising Neurological:Denies numbness, tingling or new weaknesses Behavioral/Psych: Mood is stable, no new changes  All other systems were reviewed with the patient and are negative.  PHYSICAL EXAMINATION: ECOG PERFORMANCE STATUS: 0 - Asymptomatic  Filed Vitals:   03/25/15 1301  BP: 129/84  Pulse: 92  Temp: 98 F (36.7 C)  Resp: 18   Filed Weights   03/25/15 1301  Weight: 213 lb 11.2 oz (96.934 kg)    GENERAL:alert, no distress and comfortable SKIN: skin color, texture, turgor are normal, no rashes or significant lesions EYES: normal, Conjunctiva are pink and non-injected, sclera clear Musculoskeletal:no cyanosis of digits and no clubbing  NEURO: alert & oriented x 3 with fluent speech, no focal motor/sensory deficits  LABORATORY DATA:  I have reviewed the data as listed Results for orders placed or performed in visit on 03/25/15 (from the past 48 hour(s))  CBC & Diff and Retic     Status: Abnormal   Collection Time: 03/25/15 12:48 PM  Result Value Ref Range   WBC 7.0 3.9 -  10.3 10e3/uL   NEUT# 4.8 1.5 - 6.5 10e3/uL   HGB 12.5 11.6 - 15.9 g/dL   HCT 16.1 09.6 - 04.5 %   Platelets 419 (H) 145 - 400 10e3/uL   MCV 77.2 (L) 79.5 - 101.0 fL   MCH 25.0 (L) 25.1 - 34.0 pg   MCHC 32.4 31.5 - 36.0 g/dL   RBC 4.09 8.11 - 9.14 10e6/uL   RDW 14.6 (H)  11.2 - 14.5 %   lymph# 1.9 0.9 - 3.3 10e3/uL   MONO# 0.3 0.1 - 0.9 10e3/uL   Eosinophils Absolute 0.1 0.0 - 0.5 10e3/uL   Basophils Absolute 0.0 0.0 - 0.1 10e3/uL   NEUT% 68.1 38.4 - 76.8 %   LYMPH% 27.5 14.0 - 49.7 %   MONO% 3.6 0.0 - 14.0 %   EOS% 0.7 0.0 - 7.0 %   BASO% 0.1 0.0 - 2.0 %   Retic % 1.37 0.70 - 2.10 %   Retic Ct Abs 68.50 33.70 - 90.70 10e3/uL   Immature Retic Fract 4.90 1.60 - 10.00 %  Ferritin     Status: None   Collection Time: 03/25/15 12:48 PM  Result Value Ref Range   Ferritin 219 9 - 269 ng/ml    Lab Results  Component Value Date   WBC 7.0 03/25/2015   HGB 12.5 03/25/2015   HCT 38.6 03/25/2015   MCV 77.2* 03/25/2015   PLT 419* 03/25/2015   ASSESSMENT & PLAN:  Iron deficiency anemia This patient had severe iron deficiency anemia requiring iron infusion. She is currently not anemic. There is no indication to give her iron infusion unless her ferritin is less than 50.  Beta thalassemia trait She is made aware that she the thalassemia trait could cause her to look anemic. In any case, I also discussed with her implication of inheritance to her children. The present time, she does not need any further workup. Her children does not need to be tested at this point in time.   All questions were answered. The patient knows to call the clinic with any problems, questions or concerns. No barriers to learning was detected.  I spent 15 minutes counseling the patient face to face. The total time spent in the appointment was 20 minutes and more than 50% was on counseling.     [facility], Jaqua Ching, MD 6/14/20168:08 AM

## 2015-03-28 ENCOUNTER — Telehealth: Payer: Self-pay | Admitting: Neurology

## 2015-03-28 MED ORDER — TOPIRAMATE 100 MG PO TABS: 100.0000 mg | ORAL_TABLET | Freq: Two times a day (BID) | ORAL | Status: DC

## 2015-03-28 NOTE — Telephone Encounter (Signed)
Patient called stating she had stopped the topamax due to wanting to get pregnant but she has changed her mind and is wanting to restart topamax. . Please call and advise. Patient can be reached at [phone].

## 2015-03-28 NOTE — Telephone Encounter (Signed)
Left message letting her know that the new rx has been sent to the pharmacy.  Told her she may want to start with one tablet daily for one week then increase to one tablet, BID.  This will help reintroduce her body to the medication and lessen the chance of adverse side effects.

## 2015-03-28 NOTE — Telephone Encounter (Signed)
I have reviewed she was previously on Topamax 100 mg twice a day, works well as migraine prevention, may restart Topamax at previous dosage, new prescription was called in

## 2015-04-01 ENCOUNTER — Ambulatory Visit: Payer: 59

## 2015-10-15 MED FILL — TOPIRAMATE 100 MG TABLET: 100 | 30 days supply | Qty: 60 | Fill #5

## 2015-10-15 MED FILL — traZODone HCL 150 MG TABS: 150 | 30 days supply | Qty: 30 | Fill #6

## 2015-11-07 MED FILL — traZODone HCL 150 MG TABS: 150 | 30 days supply | Qty: 30 | Fill #0

## 2015-11-07 MED FILL — BUTORPHANOL 10 MG/ML SPRAY: 10 | 30 days supply | Qty: 8 | Fill #0

## 2015-11-16 DIAGNOSIS — R05 Cough: Secondary | ICD-10-CM | POA: Diagnosis not present

## 2015-12-06 MED FILL — traZODone HCL 150 MG TABS: 150 | 30 days supply | Qty: 30 | Fill #1

## 2015-12-06 MED FILL — BUTORPHANOL 10 MG/ML SPRAY: 10 | 30 days supply | Qty: 8 | Fill #1

## 2015-12-06 MED FILL — OMEPRAZOLE DR 40 MG CAPSULE: 40 | 30 days supply | Qty: 60 | Fill #1

## 2016-01-03 MED FILL — BUTORPHANOL 10 MG/ML SPRAY: 10 | 30 days supply | Qty: 8 | Fill #2

## 2016-01-09 MED FILL — traZODone HCL 150 MG TABS: 150 | 30 days supply | Qty: 30 | Fill #2

## 2016-01-29 DIAGNOSIS — G43109 Migraine with aura, not intractable, without status migrainosus: Secondary | ICD-10-CM | POA: Diagnosis not present

## 2016-01-29 DIAGNOSIS — D509 Iron deficiency anemia, unspecified: Secondary | ICD-10-CM | POA: Diagnosis not present

## 2016-01-29 DIAGNOSIS — J302 Other seasonal allergic rhinitis: Secondary | ICD-10-CM | POA: Diagnosis not present

## 2016-02-03 MED FILL — traZODone HCL 150 MG TABS: 150 | 30 days supply | Qty: 30 | Fill #3

## 2016-03-02 DIAGNOSIS — K029 Dental caries, unspecified: Secondary | ICD-10-CM | POA: Diagnosis not present

## 2016-03-02 DIAGNOSIS — J329 Chronic sinusitis, unspecified: Secondary | ICD-10-CM | POA: Diagnosis not present

## 2016-03-03 MED FILL — traMADol HCL 50 MG TABS: 50 | 7 days supply | Qty: 28 | Fill #0

## 2016-03-03 MED FILL — AMOX TR-K CLV 875-125 MG TA: 875-125 | 10 days supply | Qty: 20 | Fill #0

## 2016-03-05 MED FILL — traZODone HCL 150 MG TABS: 150 | 30 days supply | Qty: 30 | Fill #4

## 2016-03-12 MED FILL — traMADol HCL 50 MG TABS: 50 | 10 days supply | Qty: 40 | Fill #0

## 2016-03-17 ENCOUNTER — Other Ambulatory Visit: Payer: Self-pay | Admitting: Hematology and Oncology

## 2016-03-17 ENCOUNTER — Telehealth: Payer: Self-pay | Admitting: *Deleted

## 2016-03-17 ENCOUNTER — Other Ambulatory Visit (HOSPITAL_BASED_OUTPATIENT_CLINIC_OR_DEPARTMENT_OTHER): Payer: 59

## 2016-03-17 ENCOUNTER — Encounter: Payer: Self-pay | Admitting: Hematology and Oncology

## 2016-03-17 DIAGNOSIS — D509 Iron deficiency anemia, unspecified: Secondary | ICD-10-CM | POA: Diagnosis not present

## 2016-03-17 DIAGNOSIS — Z9884 Bariatric surgery status: Secondary | ICD-10-CM

## 2016-03-17 DIAGNOSIS — E538 Deficiency of other specified B group vitamins: Secondary | ICD-10-CM | POA: Insufficient documentation

## 2016-03-17 HISTORY — DX: Deficiency of other specified B group vitamins: E53.8

## 2016-03-17 LAB — CBC & DIFF AND RETIC
BASO%: 0.3 % (ref 0.0–2.0)
Basophils Absolute: 0 10*3/uL (ref 0.0–0.1)
EOS ABS: 0.1 10*3/uL (ref 0.0–0.5)
EOS%: 1.3 % (ref 0.0–7.0)
HCT: 35.1 % (ref 34.8–46.6)
HEMOGLOBIN: 10.9 g/dL — AB (ref 11.6–15.9)
IMMATURE RETIC FRACT: 11.5 % — AB (ref 1.60–10.00)
LYMPH%: 32.1 % (ref 14.0–49.7)
MCH: 24 pg — ABNORMAL LOW (ref 25.1–34.0)
MCHC: 31.1 g/dL — ABNORMAL LOW (ref 31.5–36.0)
MCV: 77.1 fL — ABNORMAL LOW (ref 79.5–101.0)
MONO#: 0.3 10*3/uL (ref 0.1–0.9)
MONO%: 8.5 % (ref 0.0–14.0)
NEUT%: 57.8 % (ref 38.4–76.8)
NEUTROS ABS: 2.2 10*3/uL (ref 1.5–6.5)
PLATELETS: 366 10*3/uL (ref 145–400)
RBC: 4.55 10*6/uL (ref 3.70–5.45)
RDW: 15.5 % — ABNORMAL HIGH (ref 11.2–14.5)
Retic %: 1.68 % (ref 0.70–2.10)
Retic Ct Abs: 76.44 10*3/uL (ref 33.70–90.70)
WBC: 3.8 10*3/uL — ABNORMAL LOW (ref 3.9–10.3)
lymph#: 1.2 10*3/uL (ref 0.9–3.3)

## 2016-03-17 LAB — FERRITIN: FERRITIN: 113 ng/mL (ref 9–269)

## 2016-03-17 NOTE — Telephone Encounter (Signed)
-----   Message from Artis DelayNi Gorsuch, MD sent at 03/17/2016 10:35 AM EDT ----- Regarding: labs The patient is mildly anemic but not iron def Would not recommend IV iron now and recommend recheck cbc in 3 months Please call her if that's ok and then let me know and I will place POF  ----- Message -----    From: [facility] Interface    Sent: 03/17/2016   8:45 AM      To: Artis DelayNi Gorsuch, MD

## 2016-03-17 NOTE — Telephone Encounter (Signed)
Informed pt of Lab results and Dr. Maxine GlennGorsuch's message. She agrees to return in 3 months as recommended by Dr. Bertis RuddyGorsuch.  She will call us if any change in condition before next appt..[name]

## 2016-03-24 ENCOUNTER — Ambulatory Visit: Payer: 59 | Admitting: Hematology and Oncology

## 2016-03-24 MED FILL — traMADol HCL 50 MG TABS: 50 | 10 days supply | Qty: 40 | Fill #0

## 2016-04-02 MED FILL — traMADol HCL 50 MG TABS: 50 | 30 days supply | Qty: 120 | Fill #0

## 2016-04-02 MED FILL — traZODone HCL 150 MG TABS: 150 | 30 days supply | Qty: 30 | Fill #5

## 2016-04-25 ENCOUNTER — Ambulatory Visit (INDEPENDENT_AMBULATORY_CARE_PROVIDER_SITE_OTHER): Payer: 59 | Admitting: Emergency Medicine

## 2016-04-25 VITALS — BP 128/78 | HR 91 | Temp 98.3°F | Resp 14 | Ht 60.5 in | Wt 205.0 lb

## 2016-04-25 DIAGNOSIS — K0889 Other specified disorders of teeth and supporting structures: Secondary | ICD-10-CM

## 2016-04-25 NOTE — Progress Notes (Signed)
By signing my name below, I, Stann Ore, attest that this documentation has been prepared under the direction and in the presence of Lesle Chris, MD. Electronically Signed: Stann Ore, Scribe. 04/25/2016 , 11:52 AM .  Patient was seen in room 12 .  Chief Complaint:  Chief Complaint  Patient presents with  . Dental Pain    6 months    HPI: Meredith Byrd is a 35 y.o. female who reports to [facility] today complaining of dental pain for 6 months, worsening in the past few days. Patient notes cracked tooth in her upper left jaw with extreme pain. She has had same tooth trouble in the past. She plans to see dentist in 3 days. She denies any known drug allergies.   She was given tramadol from her PCP, but denies having relief with the medication.   She takes fioricet for her headaches.   Past Medical History  Diagnosis Date  . Allergy   . Anemia   . GERD (gastroesophageal reflux disease)   . Hypertension   . Migraines   . Palpitations   . Hx of echocardiogram     The echocadiogram was essentially normal with the exception of mild mitral calcification and borderline concentric LVH, which in the setting of her hypertension at this early age is something that mean her blood pressure is well controlled.   [name] History of stress test 02/2011 ([facility])    there was no evidence of ischemia, but she only went 3 1/2 minutes on the treadmill making it very difficult to get a good accurate assessment, however  . Iron deficiency anemia, unspecified 07/09/2014  . Arthritis pain 08/20/2014  . Vitamin B12 deficiency 03/17/2016   Past Surgical History  Procedure Laterality Date  . Cholecystectomy    . Breast surgery    . Gastric bypass  2008  . Oophorectomy Right 2010   Social History   Social History  . Marital Status: Married    Spouse Name: Damon  . Number of Children: 4  . Years of Education: Associates   Occupational History  . Full time student    Social History Main Topics    . Smoking status: Never Smoker   . Smokeless tobacco: Never Used  . Alcohol Use: No  . Drug Use: No  . Sexual Activity: Yes   Other Topics Concern  . None   Social History Narrative   Right handed.   Lives at home with husband and four children.   Occasional caffeine use.   Family History  Problem Relation Age of Onset  . Asthma Mother   . Heart disease Mother   . Diabetes Mother   . Early death Mother   . Hypertension Mother   . Stroke Maternal Grandmother   . Heart disease Maternal Grandmother   . Hypertension Maternal Grandmother   . Hyperlipidemia Maternal Grandmother    No Known Allergies Prior to Admission medications   Medication Sig Start Date End Date Taking? Authorizing Provider  alosetron (LOTRONEX) 1 MG tablet Take 2 mg by mouth daily.   Yes Historical Provider, MD  butalbital-acetaminophen-caffeine (FIORICET, ESGIC) 50-325-40 MG per tablet Take 1 tablet by mouth every 6 (six) hours as needed for headache. 01/03/15  Yes Levert Feinstein, MD  clidinium-chlordiazePOXIDE (LIBRAX) 5-2.5 MG per capsule 2 capsules 4 times daily. 03/22/14  Yes Tommie Sams, DO  Cyanocobalamin (VITAMIN B-12 IJ) Inject 1 Applicatorful as directed every 30 (thirty) days.    Yes Historical Provider, MD  HYDROcodone-homatropine (HYCODAN) 5-1.5 MG/5ML syrup Take 5 mLs by mouth every 6 (six) hours as needed. 02/05/15  Yes Tonye Pearsonobert P Doolittle, MD  losartan-hydrochlorothiazide (HYZAAR) 50-12.5 MG per tablet Take 1 tablet by mouth daily.   Yes Historical Provider, MD  omeprazole (PRILOSEC) 40 MG capsule Take 40 mg by mouth daily.   Yes Historical Provider, MD  topiramate (TOPAMAX) 100 MG tablet Take 1 tablet (100 mg total) by mouth 2 (two) times daily. 03/28/15  Yes Levert FeinsteinYijun Yan, MD  traZODone (DESYREL) 50 MG tablet Take 150 mg by mouth at bedtime as needed. 03/22/14  Yes Tommie SamsJayce G Cook, DO  Vitamin D, Ergocalciferol, (DRISDOL) 50000 UNITS CAPS capsule Take 50,000 Units by mouth every 7 (seven) days.   Yes  Historical Provider, MD     ROS:  Constitutional: negative for fever, chills, night sweats, weight changes, or fatigue  HEENT: negative for vision changes, hearing loss, congestion, rhinorrhea, ST, epistaxis, or sinus pressure; positive for dental pain Cardiovascular: negative for chest pain or palpitations Respiratory: negative for hemoptysis, wheezing, shortness of breath, or cough Abdominal: negative for abdominal pain, nausea, vomiting, diarrhea, or constipation Dermatological: negative for rash Neurologic: negative for headache, dizziness, or syncope All other systems reviewed and are otherwise negative with the exception to those above and in the HPI.  PHYSICAL EXAM: Filed Vitals:   04/25/16 1114  BP: 128/78  Pulse: 91  Temp: 98.3 F (36.8 C)  Resp: 14   Body mass index is 39.36 kg/(m^2).   General: Alert, no acute distress HEENT:  Normocephalic, atraumatic, oropharynx patent; Vertical crack through the left upper second molar, doesn't appear to have drainage around the tooth [facility] Cardiovascular:  Regular rate and rhythm, no rubs murmurs or gallops.  No Carotid bruits, radial pulse intact. No pedal edema.  Respiratory: Clear to auscultation bilaterally.  No wheezes, rales, or rhonchi.  No cyanosis, no use of accessory musculature Abdominal: No organomegaly, abdomen is soft and non-tender, positive bowel sounds. No masses. Musculoskeletal: Gait intact. No edema, tenderness Skin: No rashes. Neurologic: Facial musculature symmetric. Psychiatric: Patient acts appropriately throughout our interaction.  Lymphatic: No cervical or submandibular lymphadenopathy Genitourinary/Anorectal: No acute findings  LABS:   EKG/XRAY:     ASSESSMENT/PLAN: Patient has been on tramadol. She received 120 tablets on 04/02/2016. I offered her a few hydrocodone to last until she could see the dentist. She stated that medication did not help her. She declined antibiotics and will  follow-up with the dentist on Tuesday. No prescriptions were written.I personally performed the services described in this documentation, which was scribed in my presence. The recorded information has been reviewed and is accurate.  Gross sideeffects, risk and benefits, and alternatives of medications d/w patient. Patient is aware that all medications have potential sideeffects and we are unable to predict every sideeffect or drug-drug interaction that may occur.  Lesle ChrisSteven Adelaido Nicklaus MD 04/25/2016 11:28 AM

## 2016-04-25 NOTE — Patient Instructions (Addendum)
Please follow-up with the dentist on TuesdayDental Pain Dental pain may be caused by many things, including:  Tooth decay (cavities or caries). Cavities expose the nerve of your tooth to air and hot or cold temperatures. This can cause pain or discomfort.  Abscess or infection. A dental abscess is a collection of infected pus from a bacterial infection in the inner part of the tooth (pulp). It usually occurs at the end of the tooth's root.  Injury.  An unknown reason (idiopathic). Your pain may be mild or severe. It may only occur when:  You are chewing.  You are exposed to hot or cold temperature.  You are eating or drinking sugary foods or beverages, such as soda or candy. Your pain may also be constant. HOME CARE INSTRUCTIONS Watch your dental pain for any changes. The following actions may help to lessen any discomfort that you are feeling:  Take medicines only as directed by your dentist.  If you were prescribed an antibiotic medicine, finish all of it even if you start to feel better.  Keep all follow-up visits as directed by your dentist. This is important.  Do not apply heat to the outside of your face.  Rinse your mouth or gargle with salt water if directed by your dentist. This helps with pain and swelling.  You can make salt water by adding  tsp of salt to 1 cup of warm water.  Apply ice to the painful area of your face:  Put ice in a plastic bag.  Place a towel between your skin and the bag.  Leave the ice on for 20 minutes, 2-3 times per day.  Avoid foods or drinks that cause you pain, such as:  Very hot or very cold foods or drinks.  Sweet or sugary foods or drinks. SEEK MEDICAL CARE IF:  Your pain is not controlled with medicines.  Your symptoms are worse.  You have new symptoms. SEEK IMMEDIATE MEDICAL CARE IF:  You are unable to open your mouth.  You are having trouble breathing or swallowing.  You have a fever.  Your face, neck, or jaw is  swollen.   This information is not intended to replace advice given to you by your health care provider. Make sure you discuss any questions you have with your health care provider.   Document Released: 09/28/2005 Document Revised: 02/12/2015 Document Reviewed: 09/24/2014 Elsevier Interactive Patient Education 2016 [employer]     IF you received an x-ray today, you will receive an invoice from [facility]. Please contact [facility] at [phone] with questions or concerns regarding your invoice.   IF you received labwork today, you will receive an invoice from [facility]. Please contact [facility] at [phone] with questions or concerns regarding your invoice.   Our billing staff will not be able to assist you with questions regarding bills from these companies.  You will be contacted with the lab results as soon as they are available. The fastest way to get your results is to activate your My Chart account. Instructions are located on the last page of this paperwork. If you have not heard from us regarding the results in 2 weeks, please contact this office.

## 2016-04-28 MED FILL — DENTA 5000 PLUS CREAM: 1.1 | 30 days supply | Qty: 51 | Fill #0

## 2016-04-28 MED FILL — ACETAMINOPHEN/COD #3 TABLET: 300-30 | 2 days supply | Qty: 12 | Fill #0

## 2016-04-28 MED FILL — traZODone HCL 150 MG TABS: 150 | 30 days supply | Qty: 30 | Fill #6

## 2016-04-28 MED FILL — IBUPROFEN 600 MG TABLET: 600 | 8 days supply | Qty: 30 | Fill #0

## 2016-05-01 MED FILL — traMADol HCL 50 MG TABS: 50 | 30 days supply | Qty: 120 | Fill #0

## 2016-05-14 MED FILL — MEDROXYPROG 150 MG/ML SYR: 150 | 84 days supply | Qty: 1 | Fill #0 | Status: TO

## 2016-05-27 MED FILL — IBUPROFEN 600 MG TABLET: 600 | 8 days supply | Qty: 30 | Fill #1 | Status: TO

## 2016-05-29 MED FILL — traZODone HCL 150 MG TABS: 150 | 30 days supply | Qty: 30 | Fill #0

## 2016-05-29 MED FILL — traMADol HCL 50 MG TABS: 50 | 30 days supply | Qty: 120 | Fill #0

## 2016-06-16 ENCOUNTER — Other Ambulatory Visit: Payer: 59

## 2016-06-23 ENCOUNTER — Ambulatory Visit: Payer: 59 | Admitting: Hematology and Oncology

## 2016-06-23 ENCOUNTER — Ambulatory Visit: Payer: 59

## 2016-06-23 ENCOUNTER — Encounter: Payer: Self-pay | Admitting: Hematology and Oncology

## 2016-06-23 ENCOUNTER — Encounter: Payer: Self-pay | Admitting: *Deleted

## 2016-06-23 NOTE — Progress Notes (Signed)
No Show letter from Dr. Gorsuch placed in outgoing mail to pt's home address.  

## 2016-06-25 MED FILL — OMEPRAZOLE DR 40 MG CAPSULE: 40 | 90 days supply | Qty: 90 | Fill #0 | Status: TO

## 2016-06-25 MED FILL — traZODone HCL 150 MG TABS: 150 | 90 days supply | Qty: 90 | Fill #0 | Status: TO

## 2016-06-28 MED FILL — traMADol HCL 50 MG TABS: 50 | 90 days supply | Qty: 360 | Fill #0

## 2017-01-18 DIAGNOSIS — E559 Vitamin D deficiency, unspecified: Secondary | ICD-10-CM | POA: Insufficient documentation

## 2017-01-18 NOTE — Progress Notes (Signed)
HPI:   Meredith Byrd is a 36 y.o. female, who is here today to establish care.  Former PCP: [facility]. Last preventive routine visit: 2014  Chronic medical problems: Obesity (s/p gastro by pass surgery in 2008),iron def anemia with beta thalassemia trait,migraine headache,HTN,insomnia,vit D deficiency. She has followed with neuro for migraine, Dr Terrace Arabia. She also followed with hematologists, Dr Bertis Ruddy.  Lab Results  Component Value Date   WBC 3.8 (L) 03/17/2016   HGB 10.9 (L) 03/17/2016   HCT 35.1 03/17/2016   MCV 77.1 (L) 03/17/2016   PLT 366 03/17/2016   She did not tolerate Fe Sulfate well.   Concerns today:   Arthralgias and back pain: 3-4 months of bilateral hand joint pain , MTP,worse in the morning,no edema or erythema, stiffness. She denies limitation of movement.  Low back pain for 3-4 months. Hx of "sciatic" pain,numbness and tingling sensation right thigh, posterior aspect. Achy/sharp, intermittent ,7-8/10. She denies saddle anesthesia or urine/bowel.  Exacerbated by prolonged standing or walking. Alleviated  some with rest.  -Migraine headache: Dx about 10 years ago. She has not followed with Dr Terrace Arabia since 2016. She is currently on Tramadol and nasal Butorphanol, has been on this regimen for about 5 years. She has tried other medications but did not tolerated well.  She has been on Topamax, caused memory issues. Amitriptyline did not help.Imitrex did not help to prevent headache.  Left or right fronto temporal ,sharp pain, associated with nausea and photophobia. She has about 10-15 migraines monthly, headache last < 24 hours when she takes medications but has a new episode 1-2 days later. She has headache today. Exacerbated by weather changes   LMP 08/2016, she was on Depo Provera,has not had it since 08/2016. She is sexually active.  HTN: She stopped antihypertensive medications about 1-2 years. She was on  Losartan-HCTZ.  She does not check BP at home. Denies visual changes, chest pain, dyspnea, palpitation, claudication, focal weakness, or edema.  Insomnia:  She is on Trazodone 150 mg daily. Sleeps about 5-7 hours    She does not exercise regularly and has not followed a healthy diet consistently. She eats small portions, s/p gastro bypass surgery.Developed GERD like symptoms, currently on Omeprazole 40 mg daily. Vit D deficiency, she is on Vit D supplementation: Ergocalciferol 50,000 U weekly.     Review of Systems  Constitutional: Positive for fatigue. Negative for activity change, appetite change, fever and unexpected weight change.  HENT: Negative for mouth sores, nosebleeds, sore throat, trouble swallowing and voice change.   Eyes: Positive for photophobia. Negative for redness and visual disturbance.  Respiratory: Negative for cough, shortness of breath and wheezing.   Cardiovascular: Negative for chest pain, palpitations and leg swelling.  Gastrointestinal: Positive for nausea. Negative for abdominal pain, blood in stool and vomiting.       Negative for changes in bowel habits.  Endocrine: Negative for cold intolerance, heat intolerance, polydipsia, polyphagia and polyuria.  Genitourinary: Negative for decreased urine volume and hematuria.  Musculoskeletal: Positive for arthralgias and back pain. Negative for gait problem.  Skin: Negative for pallor and rash.  Allergic/Immunologic: Positive for environmental allergies.  Neurological: Positive for numbness and headaches. Negative for seizures, syncope, facial asymmetry and weakness.  Hematological: Negative for adenopathy. Does not bruise/bleed easily.  Psychiatric/Behavioral: Positive for sleep disturbance. Negative for confusion and suicidal ideas. The patient is nervous/anxious.       Current Outpatient Prescriptions on File Prior to  Visit  Medication Sig Dispense Refill  . omeprazole (PRILOSEC) 40 MG capsule Take 40 mg  by mouth daily.    . Vitamin D, Ergocalciferol, (DRISDOL) 50000 UNITS CAPS capsule Take 50,000 Units by mouth every 7 (seven) days.     No current facility-administered medications on file prior to visit.      Past Medical History:  Diagnosis Date  . Allergy   . Anemia   . Arthritis pain 08/20/2014  . GERD (gastroesophageal reflux disease)   . History of stress test 02/2011 ([facility])   there was no evidence of ischemia, but she only went 3 1/2 minutes on the treadmill making it very difficult to get a good accurate assessment, however  . Hx of echocardiogram    The echocadiogram was essentially normal with the exception of mild mitral calcification and borderline concentric LVH, which in the setting of her hypertension at this early age is something that mean her blood pressure is well controlled.   . Hypertension   . Iron deficiency anemia, unspecified 07/09/2014  . Migraines   . Palpitations   . Vitamin B12 deficiency 03/17/2016   No Known Allergies  Family History  Problem Relation Age of Onset  . Stroke Maternal Grandmother   . Heart disease Maternal Grandmother   . Hypertension Maternal Grandmother   . Hyperlipidemia Maternal Grandmother   . Asthma Mother   . Heart disease Mother   . Diabetes Mother   . Early death Mother   . Hypertension Mother   . Hypertension Father   . Heart disease Father     Social History   Social History  . Marital status: Married    Spouse name: Damon  . Number of children: 4  . Years of education: Associates   Occupational History  . Full time student    Social History Main Topics  . Smoking status: Never Smoker  . Smokeless tobacco: Never Used  . Alcohol use No  . Drug use: No  . Sexual activity: Yes   Other Topics Concern  . None   Social History Narrative   Right handed.   Lives at home with husband and four children.   Occasional caffeine use.    Vitals:   01/19/17 0755  BP: 122/70  Pulse: 98  Resp: 12   Body mass  index is 45.4 kg/m.  Physical Exam  Nursing note and vitals reviewed. Constitutional: She is oriented to person, place, and time. She appears well-developed. No distress.  HENT:  Head: Atraumatic.  Mouth/Throat: Oropharynx is clear and moist and mucous membranes are normal.  Eyes: Conjunctivae and EOM are normal. Pupils are equal, round, and reactive to light.  Neck: No tracheal deviation present. Thyromegaly present. No thyroid mass present.  Cardiovascular: Normal rate and regular rhythm.   No murmur heard. Pulses:      Dorsalis pedis pulses are 2+ on the right side, and 2+ on the left side.  Respiratory: Effort normal and breath sounds normal. No respiratory distress.  GI: Soft. She exhibits no mass. There is no hepatomegaly. There is no tenderness.  Musculoskeletal: She exhibits no edema.  No signs of synovitis, normal ROM of wrist,MCP,and IP joints bilateral.Tenderness elicited with palpation of MCP joint right thumb. Mild tenderness upon palpation of right paraspinal lumbar muscles,spasm also noted. Not antalgic gait.   Lymphadenopathy:    She has no cervical adenopathy.  Neurological: She is alert and oriented to person, place, and time. She has normal strength. No cranial nerve deficit.  Coordination and gait normal.  SLR negative bilateral.  Skin: Skin is warm. No rash noted. No erythema.  Psychiatric: She has a normal mood and affect.  Well groomed, good eye contact.     ASSESSMENT AND PLAN:   Makenly was seen today for establish care.  Diagnoses and all orders for this visit:  Intractable migraine without aura and without status migrainosus  I do not recommend Tramadol and Butorphanol. Toradol 60 mg IM given today after verbal consent. Instructed about warning signs. Appt with neuro will be arranged.  -     Ambulatory referral to Neurology -     ketorolac (TORADOL) injection 60 mg; Inject 2 mLs (60 mg total) into the muscle once.  Insomnia, unspecified  type  Good sleep hygiene. Trazodone to continue but decrease dose from 150 mg to 100 mg. F/U in 4 weeks.  -     traZODone (DESYREL) 50 MG tablet; Take 2 tablets (100 mg total) by mouth at bedtime as needed for sleep.  Essential hypertension  Well controlled with non pharmacologic treatment. Continue low salt diet and annual eye exam.  -     Lipid panel -     Basic metabolic panel  Vitamin D deficiency  No changes in current management, will follow labs done today and will give further recommendations accordingly.  -     VITAMIN D 25 Hydroxy (Vit-D Deficiency, Fractures)  Iron deficiency anemia, unspecified iron deficiency anemia type  She is not on Fe Sulfate. We will follow labs done today and will give further recommendations accordingly.  -     CBC with Differential/Platelet -     Ferritin  Enlarged thyroid gland  Not aware of prior Hx of goiter. Thyroid U/S will be arranged.  -     TSH -     US THYROID; Future  Arthralgia of both hands  She is concerned about RA. ? OA. Depending of lab results rheuma evaluation will be considered. Cymbalta may help.   -     Sedimentation rate -     DULoxetine (CYMBALTA) 30 MG capsule; Take 1 capsule (30 mg total) by mouth daily. -     Cyclic citrul peptide antibody, IgG -     Rheumatoid factor  Chronic low back pain with right-sided sciatica, unspecified back pain laterality  We discussed treatment options. She would like to try Cymbalta, start 30 mg.Some side effects discussed. F/U in 4 weeks.   Amenorrhea  Strongly recommend avoiding pregnancy while taking current medications.  -     POCT urine pregnancy     OV face to face from 8:03-8:50 am. > 50 % of visit was dedicated to discuss prognosis and treatment for some of her medical problems.Reviewing medications and discussing some side effects, and coordination of care.    Betty G. [place], MD  [facility]. [place]  office.

## 2017-01-19 ENCOUNTER — Encounter: Payer: Self-pay | Admitting: Family Medicine

## 2017-01-19 ENCOUNTER — Ambulatory Visit (INDEPENDENT_AMBULATORY_CARE_PROVIDER_SITE_OTHER): Payer: 59 | Admitting: Family Medicine

## 2017-01-19 VITALS — BP 122/70 | HR 98 | Resp 12 | Ht 60.5 in | Wt 236.4 lb

## 2017-01-19 DIAGNOSIS — D509 Iron deficiency anemia, unspecified: Secondary | ICD-10-CM | POA: Diagnosis not present

## 2017-01-19 DIAGNOSIS — E049 Nontoxic goiter, unspecified: Secondary | ICD-10-CM | POA: Diagnosis not present

## 2017-01-19 DIAGNOSIS — G8929 Other chronic pain: Secondary | ICD-10-CM

## 2017-01-19 DIAGNOSIS — G43019 Migraine without aura, intractable, without status migrainosus: Secondary | ICD-10-CM | POA: Diagnosis not present

## 2017-01-19 DIAGNOSIS — G4700 Insomnia, unspecified: Secondary | ICD-10-CM | POA: Diagnosis not present

## 2017-01-19 DIAGNOSIS — M25541 Pain in joints of right hand: Secondary | ICD-10-CM

## 2017-01-19 DIAGNOSIS — M25542 Pain in joints of left hand: Secondary | ICD-10-CM | POA: Diagnosis not present

## 2017-01-19 DIAGNOSIS — N912 Amenorrhea, unspecified: Secondary | ICD-10-CM

## 2017-01-19 DIAGNOSIS — I10 Essential (primary) hypertension: Secondary | ICD-10-CM

## 2017-01-19 DIAGNOSIS — M545 Low back pain: Secondary | ICD-10-CM

## 2017-01-19 DIAGNOSIS — M5441 Lumbago with sciatica, right side: Secondary | ICD-10-CM

## 2017-01-19 DIAGNOSIS — E559 Vitamin D deficiency, unspecified: Secondary | ICD-10-CM

## 2017-01-19 LAB — BASIC METABOLIC PANEL
BUN: 8 mg/dL (ref 6–23)
CHLORIDE: 103 meq/L (ref 96–112)
CO2: 28 meq/L (ref 19–32)
Calcium: 8.9 mg/dL (ref 8.4–10.5)
Creatinine, Ser: 0.66 mg/dL (ref 0.40–1.20)
GFR: 129.21 mL/min (ref >60.00)
GLUCOSE: 83 mg/dL (ref 70–99)
Potassium: 4 mEq/L (ref 3.5–5.1)
SODIUM: 138 meq/L (ref 135–145)

## 2017-01-19 LAB — CBC WITH DIFFERENTIAL/PLATELET
BASOS PCT: 0.4 % (ref 0.0–3.0)
Basophils Absolute: 0 10*3/uL (ref 0.0–0.1)
EOS ABS: 0.1 10*3/uL (ref 0.0–0.7)
Eosinophils Relative: 1.4 % (ref 0.0–5.0)
HCT: 35.3 % — ABNORMAL LOW (ref 36.0–46.0)
HEMOGLOBIN: 11 g/dL — AB (ref 12.0–15.0)
LYMPHS ABS: 1.4 10*3/uL (ref 0.7–4.0)
Lymphocytes Relative: 23.2 % (ref 12.0–46.0)
MCHC: 31.1 g/dL (ref 30.0–36.0)
MCV: 77.1 fl — ABNORMAL LOW (ref 78.0–100.0)
MONO ABS: 0.2 10*3/uL (ref 0.1–1.0)
Monocytes Relative: 3.8 % (ref 3.0–12.0)
NEUTROS PCT: 71.2 % (ref 43.0–77.0)
Neutro Abs: 4.3 10*3/uL (ref 1.4–7.7)
PLATELETS: 440 10*3/uL — AB (ref 150.0–400.0)
RBC: 4.58 Mil/uL (ref 3.87–5.11)
RDW: 16 % — AB (ref 11.5–15.5)
WBC: 6 10*3/uL (ref 4.0–10.5)

## 2017-01-19 LAB — LIPID PANEL
CHOLESTEROL: 133 mg/dL (ref 0–200)
HDL: 55.4 mg/dL (ref >39.00)
LDL Cholesterol: 65 mg/dL (ref 0–99)
NonHDL: 77.92
Total CHOL/HDL Ratio: 2
Triglycerides: 67 mg/dL (ref 0.0–149.0)
VLDL: 13.4 mg/dL (ref 0.0–40.0)

## 2017-01-19 LAB — POCT URINE PREGNANCY: Preg Test, Ur: NEGATIVE

## 2017-01-19 LAB — FERRITIN: Ferritin: 73.9 ng/mL (ref 10.0–291.0)

## 2017-01-19 LAB — TSH: TSH: 0.78 u[IU]/mL (ref 0.35–4.50)

## 2017-01-19 LAB — SEDIMENTATION RATE: Sed Rate: 20 mm/hr (ref 0–20)

## 2017-01-19 LAB — VITAMIN D 25 HYDROXY (VIT D DEFICIENCY, FRACTURES): VITD: 15.21 ng/mL — AB (ref 30.00–100.00)

## 2017-01-19 MED ORDER — TRAZODONE HCL 50 MG PO TABS: 100.0000 mg | ORAL_TABLET | Freq: Every evening | ORAL | 1 refills | Status: DC | PRN

## 2017-01-19 MED ORDER — DULOXETINE HCL 30 MG PO CPEP: 30.0000 mg | ORAL_CAPSULE | Freq: Every day | ORAL | 1 refills | Status: DC

## 2017-01-19 MED ORDER — KETOROLAC TROMETHAMINE 60 MG/2ML IM SOLN
60.0000 mg | Freq: Once | INTRAMUSCULAR | Status: AC
  Administered 2017-01-19: 60 mg via INTRAMUSCULAR

## 2017-01-19 NOTE — Patient Instructions (Addendum)
A few things to remember from today's visit:   Essential hypertension - Plan: Lipid panel, Basic metabolic panel  Vitamin D deficiency - Plan: VITAMIN D 25 Hydroxy (Vit-D Deficiency, Fractures)  Intractable migraine without aura and without status migrainosus - Plan: Ambulatory referral to Neurology  Iron deficiency anemia, unspecified iron deficiency anemia type - Plan: CBC with Differential/Platelet, Ferritin  Insomnia, unspecified type - Plan: traZODone (DESYREL) 50 MG tablet  Enlarged thyroid gland - Plan: TSH, US THYROID  Arthralgia of both hands - Plan: Sedimentation rate, DULoxetine (CYMBALTA) 30 MG capsule, Cyclic citrul peptide antibody, IgG, Rheumatoid factor  Chronic low back pain with right-sided sciatica, unspecified back pain laterality  Amenorrhea - Plan: POCT urine pregnancy   We have ordered labs or studies at this visit.  It can take up to 1-2 weeks for results and processing. IF results require follow up or explanation, we will call you with instructions. Clinically stable results will be released to your [facility]. If you have not heard from [place] or cannot find your results in [facility] in 2 weeks please contact our office at [phone].  If you are not yet signed up for [facility], please consider signing up  What are some tips for weight loss? People become overweight for many reasons. Weight issues can run in families. They can be caused by unhealthy behaviors and a person's environment. Certain health problems and medicines can also lead to weight gain. There are some simple things you can do to reach and maintain a healthy weight:  Eat small more frequent healthy meals instead 3 bid meals. Also Weight Watchers is a good option. Avoid sweet drinks. These include regular soft drinks, fruit juices, fruit drinks, energy drinks, sweetened iced tea, and flavored milk. Avoid fast foods. Fast foods such as french fries, hamburgers, chicken nuggets, and pizza are high in  calories and can cause weight gain. Eat a healthy breakfast. People who skip breakfast tend to weigh more. Don't watch more than two hours of television per day. Chew sugar-free gum between meals to cut down on snacking. Avoid grocery shopping when you're hungry. Pack a healthy lunch instead of eating out to control what and how much you eat. Eat a lot of fruits and vegetables. Aim for about 2 cups of fruit and 2 to 3 cups of vegetables per day. Aim for 150 minutes per week of moderate-intensity exercise (such as brisk walking), or 75 minutes per week of vigorous exercise (such as jogging or running). OR 15-30 min of daily brisk walking. Be more active. Small changes in physical activity can easily be added to your daily routine. For example, take the stairs instead of the elevator. Take a walk with your family. A daily walk is a great way to get exercise and to catch up on the day's events.  Today we started Cymbalta for joint and back pain. This type of medications can increase suicidal risk in patients with depression.Most common side effects are gastrointestinal, self limited after a few weeks: diarrhea, nausea, constipation  Or diarrhea among some.  In general it is well tolerated. We will follow closely.  AVOID PREGNANCY BECAUSE MEDICATIONS YOU ARE TAKING.    Please be sure medication list is accurate. If a new problem present, please set up appointment sooner than planned today.

## 2017-01-19 NOTE — Progress Notes (Signed)
Pre visit review using our clinic review tool, if applicable. No additional management support is needed unless otherwise documented below in the visit note. 

## 2017-01-20 LAB — RHEUMATOID FACTOR: Rheumatoid fact SerPl-aCnc: <14 IU/mL (ref <14)

## 2017-01-20 LAB — CYCLIC CITRUL PEPTIDE ANTIBODY, IGG

## 2017-01-22 ENCOUNTER — Telehealth: Payer: Self-pay | Admitting: Family Medicine

## 2017-01-22 NOTE — Telephone Encounter (Signed)
° °  Pt said she need the following in her notes for work    Need Lightning lowered or reduce because it triggers her headache 2. Glare screen protective 3. Able to work from home when migraines are bad

## 2017-01-22 NOTE — Telephone Encounter (Signed)
Form has been filled out & faxed to the fax number provided on the form.

## 2017-01-23 ENCOUNTER — Encounter: Payer: Self-pay | Admitting: Family Medicine

## 2017-01-25 ENCOUNTER — Ambulatory Visit
Admission: RE | Admit: 2017-01-25 | Discharge: 2017-01-25 | Disposition: A | Discharge status: Home / Self Care | Payer: 59 | Source: Ambulatory Visit | Attending: Family Medicine | Admitting: Family Medicine

## 2017-01-25 DIAGNOSIS — E049 Nontoxic goiter, unspecified: Secondary | ICD-10-CM

## 2017-01-27 ENCOUNTER — Encounter: Payer: Self-pay | Admitting: Family Medicine

## 2017-02-09 ENCOUNTER — Ambulatory Visit (INDEPENDENT_AMBULATORY_CARE_PROVIDER_SITE_OTHER): Payer: 59 | Admitting: Neurology

## 2017-02-09 ENCOUNTER — Encounter: Payer: Self-pay | Admitting: Neurology

## 2017-02-09 VITALS — BP 133/88 | HR 98 | Ht 60.5 in | Wt 236.5 lb

## 2017-02-09 DIAGNOSIS — G43019 Migraine without aura, intractable, without status migrainosus: Secondary | ICD-10-CM

## 2017-02-09 MED ORDER — TRAMADOL HCL 50 MG PO TABS: ORAL_TABLET | ORAL | 2 refills | Status: DC

## 2017-02-09 MED ORDER — BUTALBITAL-APAP-CAFFEINE 50-325-40 MG PO TABS: 1.0000 | ORAL_TABLET | Freq: Four times a day (QID) | ORAL | 3 refills | Status: DC | PRN

## 2017-02-09 MED ORDER — PROPRANOLOL HCL 40 MG PO TABS: 40.0000 mg | ORAL_TABLET | Freq: Two times a day (BID) | ORAL | 11 refills | Status: DC

## 2017-02-09 MED ORDER — ONDANSETRON 4 MG PO TBDP: 4.0000 mg | ORAL_TABLET | Freq: Three times a day (TID) | ORAL | 6 refills | Status: DC | PRN

## 2017-02-09 NOTE — Progress Notes (Signed)
PATIENT: Meredith Byrd DOB: January 14, 1981  Chief Complaint  Patient presents with  . Migraine    She estimates 10-15 headache days per month that vary in severity.  Stadol nasal spray works well for her more severe pain.  She does not medicate her mild to moderate headaches.       HISTORICAL  Meredith Byrd is 36 years old right-handed African-American female, is referred by her primary care physician nurse practitioner Marva Panda for evaluation of migraine headaches.Initial evaluation was in February 2016.  She had long-standing history of migraine, since elementary school, her typical migraine are lateralized severe pounding headache with associated light noise sensitivity, nauseous, can last for few days, even after few weeks. She had a history of gastric bypass, lost 60 pounds in 2008, her weight has been stable  I saw her previously, while taking Topamax, her headache is much less frequent, less severe, but she has not take any preventive medications since 2014.  She now complains of increased headache frequency, since 2014, she is having 3-4 typical migraine headaches each week, each episode last for a few days, in January, she has one severe episode of migraine lasting for 2 weeks. She was not able to identify triggers, she has tried triptan treatment in the past, Imitrex, cause heart racing, tightness, Maxalt, similar side effect, without helping her headache much.  Over the past 18 months, she was given Stadol nasal spray for headaches, she gets 3 bottles of 10 mg Stadol nasal spray each month, this was prescribed by her previous primary care physician [facility], but always has recurrent headaches shortly afterwards.  UPDATE March 24th 2016: She reported only temporary partial relief with previous nerve block in November 23 2014, she had tried Zomig nasal spray 10 times, without helping her headaches, Previously Preventive medications, Topamax  works for her headaches, but she is trying to conceiving again, does not want to take too much medications, I have went over other possibilities including Inderal, nortriptyline, all belongs to category C, I also advised her avoiding triptan, and Stadol treatment during pregnancy,  She had 4 children, no significant headache during her previous pregnancy  UPDATE Feb 09 2017: She continue complains of frequent migraines about 10-15 days each month, sometimes preceded by visual distortion, usually short lasting, but sometimes she woke up with severe migraine headache, last visit was in March 2016, over the past 2 years, she was managed by her primary care physician, per patient, she was given prescription of Stadol, tramadol, but we were not able to find her refill information from [facility].  She states she has tried and failed multiple preventative medications, Topamax,cause too much memory issues, almost lost her job,  For abortive treatment she has tried Maxalt, Zomig, sumatriptan, none of above works for her, some of the medication even made it more intense.  REVIEW OF SYSTEMS: Full 14 system review of systems performed and notable only for light sensitivity, double vision, eye pain, blurred vision, cough, back pain, bruise easily, headache, anxiety  ALLERGIES: No Known Allergies  HOME MEDICATIONS: Current Outpatient Prescriptions  Medication Sig Dispense Refill  . butorphanol (STADOL) 10 MG/ML nasal spray Place 1 spray into the nose every 4 (four) hours as needed for headache.    . DULoxetine (CYMBALTA) 30 MG capsule Take 1 capsule (30 mg total) by mouth daily. 30 capsule 1  . MedroxyPROGESTERone Acetate (DEPO-PROVERA IM) Inject into the muscle.    [name] omeprazole (PRILOSEC) 40 MG  capsule Take 40 mg by mouth daily.    . traZODone (DESYREL) 50 MG tablet Take 2 tablets (100 mg total) by mouth at bedtime as needed for sleep. 60 tablet 1  . Vitamin D, Ergocalciferol, (DRISDOL)  50000 UNITS CAPS capsule Take 50,000 Units by mouth every 7 (seven) days.     No current facility-administered medications for this visit.     PAST MEDICAL HISTORY: Past Medical History:  Diagnosis Date  . Allergy   . Anemia   . Arthritis pain 08/20/2014  . GERD (gastroesophageal reflux disease)   . History of stress test 02/2011 ([facility])   there was no evidence of ischemia, but she only went 3 1/2 minutes on the treadmill making it very difficult to get a good accurate assessment, however  . Hx of echocardiogram    The echocadiogram was essentially normal with the exception of mild mitral calcification and borderline concentric LVH, which in the setting of her hypertension at this early age is something that mean her blood pressure is well controlled.   . Hypertension   . Iron deficiency anemia, unspecified 07/09/2014  . Migraines   . Palpitations   . Vitamin B12 deficiency 03/17/2016    PAST SURGICAL HISTORY: Past Surgical History:  Procedure Laterality Date  . BREAST SURGERY    . CHOLECYSTECTOMY    . GASTRIC BYPASS  2008  . OOPHORECTOMY Right 2010    FAMILY HISTORY: Family History  Problem Relation Age of Onset  . Stroke Maternal Grandmother   . Heart disease Maternal Grandmother   . Hypertension Maternal Grandmother   . Hyperlipidemia Maternal Grandmother   . Asthma Mother   . Heart disease Mother   . Diabetes Mother   . Early death Mother   . Hypertension Mother   . Hypertension Father   . Heart disease Father     SOCIAL HISTORY:  Social History   Social History  . Marital status: Married    Spouse name: Damon  . Number of children: 4  . Years of education: Associates   Occupational History  . Insurance Claims    Social History Main Topics  . Smoking status: Never Smoker  . Smokeless tobacco: Never Used  . Alcohol use No  . Drug use: No  . Sexual activity: Yes   Other Topics Concern  . Not on file   Social History Narrative   Right handed.   Lives  at home with husband and four children.   No caffeine use.     PHYSICAL EXAM   Vitals:   02/09/17 1432  BP: 133/88  Pulse: 98  Weight: 236 lb 8 oz (107.3 kg)  Height: 5' 0.5" (1.537 m)    Not recorded      Body mass index is 45.43 kg/m.  PHYSICAL EXAMNIATION:  Gen: NAD, conversant, well nourised, obese, well groomed                     Cardiovascular: Regular rate rhythm, no peripheral edema, warm, nontender. Eyes: Conjunctivae clear without exudates or hemorrhage Neck: Supple, no carotid bruits. Pulmonary: Clear to auscultation bilaterally   NEUROLOGICAL EXAM:  MENTAL STATUS: Speech:    Speech is normal; fluent and spontaneous with normal comprehension.  Cognition:     Orientation to time, place and person     Normal recent and remote memory     Normal Attention span and concentration     Normal Language, naming, repeating,spontaneous speech     Fund of  knowledge   CRANIAL NERVES: CN II: Visual fields are full to confrontation. Fundoscopic exam is normal with sharp discs and no vascular changes. Pupils are round equal and briskly reactive to light. CN III, IV, VI: extraocular movement are normal. No ptosis. CN V: Facial sensation is intact to pinprick in all 3 divisions bilaterally. Corneal responses are intact.  CN VII: Face is symmetric with normal eye closure and smile. CN VIII: Hearing is normal to rubbing fingers CN IX, X: Palate elevates symmetrically. Phonation is normal. CN XI: Head turning and shoulder shrug are intact CN XII: Tongue is midline with normal movements and no atrophy.  MOTOR: There is no pronator drift of out-stretched arms. Muscle bulk and tone are normal. Muscle strength is normal.  REFLEXES: Reflexes are 2+ and symmetric at the biceps, triceps, knees, and ankles. Plantar responses are flexor.  SENSORY: Intact to light touch, pinprick, positional sensation and vibratory sensation are intact in fingers and  toes.  COORDINATION: Rapid alternating movements and fine finger movements are intact. There is no dysmetria on finger-to-nose and heel-knee-shin.    GAIT/STANCE: Posture is normal. Gait is steady with normal steps, base, arm swing, and turning. Heel and toe walking are normal. Tandem gait is normal.  Romberg is absent.   DIAGNOSTIC DATA (LABS, IMAGING, TESTING) - I reviewed patient records, labs, notes, testing and imaging myself where available.   ASSESSMENT AND PLAN  FLORENE BRILL is a 36 y.o. female   Chronic migraine headaches  Tried and failed multiple preventative medications,  Also tried and failed multiple abortive treatment, including multiple treatments, asking for Stadol, and tramadol, we could not find her previous registry of medication refill,  After extensive discussion with patient, we decided to proceed with propanolol 40 mg twice a day as preventive medications, I gave her tramadol 50 mg 30 tablets, one tablets as needed for migraine abortion, Zofran 4 mg as needed  Other long-term preventive medication method also provided such as Botox injection.     Levert Feinstein, M.D. Ph.D.  [facility] [address] [place], [place] [address] Ph: [phone] Fax: [phone]  CC:  Betty G [place], MD

## 2017-02-25 ENCOUNTER — Telehealth: Payer: Self-pay | Admitting: Family Medicine

## 2017-02-25 NOTE — Telephone Encounter (Signed)
Can we get her on the schedule?  Thank you! 

## 2017-02-25 NOTE — Telephone Encounter (Signed)
Please advise 

## 2017-02-25 NOTE — Telephone Encounter (Signed)
Trazodone is not for joint pain, this medication she has been taking for insomnia. Cymbalta 30 mg was started last OV for OA, we started low dose 01/19/17 and recommend 4 weeks f/u. Thanks, BJ

## 2017-02-25 NOTE — Telephone Encounter (Signed)
Patient called in stating that the traZODone (DESYREL) 50 MG tablet is not helping with the joint pain in hands and the traZODone (DESYREL) 50 MG tablet is not helping her sleep since being lowered from 150mg  to 50mg .  Patient stated she started at 150 mg but it was lowered when pt started Cymbalta.  Pt states she needs it upped as she is not able to sleep.

## 2017-02-25 NOTE — Telephone Encounter (Signed)
Lm to call back

## 2017-02-26 NOTE — Telephone Encounter (Signed)
Pt states she has a high deductible plan and cannot afford to come into the office right now.  Pt would like you to up the trazodone to 150 mg  And try another med for the joint pain, because cymbalta is not working.

## 2017-03-01 ENCOUNTER — Other Ambulatory Visit: Payer: Self-pay | Admitting: Family Medicine

## 2017-03-01 DIAGNOSIS — G4700 Insomnia, unspecified: Secondary | ICD-10-CM

## 2017-03-01 DIAGNOSIS — M25541 Pain in joints of right hand: Secondary | ICD-10-CM

## 2017-03-01 DIAGNOSIS — M25542 Pain in joints of left hand: Secondary | ICD-10-CM

## 2017-03-01 MED ORDER — DULOXETINE HCL 60 MG PO CPEP: 60.0000 mg | ORAL_CAPSULE | Freq: Every day | ORAL | 0 refills | Status: DC

## 2017-03-01 MED ORDER — TRAZODONE HCL 50 MG PO TABS: 50.0000 mg | ORAL_TABLET | Freq: Every evening | ORAL | 1 refills | Status: DC | PRN

## 2017-03-01 NOTE — Telephone Encounter (Signed)
Called and spoke with patient. She is not having any bad side effects, and no suicidal thoughts. She just expected the medication to work better than it has been. Advised patient that we would increase the Cymbalta, and decrease the Trazodone to start weaning her off of that. Patient agrees with plan, and has a 4 week follow up scheduled for 6/18 at 3:30pm.

## 2017-03-01 NOTE — Telephone Encounter (Signed)
I spoke with Dr. SwazilandJordan. Patient will have to come in and be seen because of the type of medication. There is an option for the patient to be billed if need be.   Can we please get her on the schedule?  Thank you!

## 2017-03-01 NOTE — Telephone Encounter (Signed)
Please advise if patient can go back up to 150 mg of Trazadone, and if there is another medication she can try for joint pain until she can afford to come into the office.

## 2017-03-01 NOTE — Telephone Encounter (Signed)
Rx for Cymbalta 60 mg was sent. BJ

## 2017-03-01 NOTE — Telephone Encounter (Signed)
Pt would like a call back

## 2017-03-02 ENCOUNTER — Telehealth: Payer: Self-pay | Admitting: Neurology

## 2017-03-02 ENCOUNTER — Telehealth: Payer: Self-pay | Admitting: Family Medicine

## 2017-03-02 ENCOUNTER — Encounter: Payer: Self-pay | Admitting: *Deleted

## 2017-03-02 DIAGNOSIS — G4700 Insomnia, unspecified: Secondary | ICD-10-CM

## 2017-03-02 MED ORDER — TRAZODONE HCL 50 MG PO TABS: 50.0000 mg | ORAL_TABLET | Freq: Every evening | ORAL | 1 refills | Status: DC | PRN

## 2017-03-02 NOTE — Telephone Encounter (Signed)
Dr. Terrace ArabiaYan has instructed patient to discontinue use of Fioricet and use Tramadol for pain.  The patient does not feel propranolol has been helpful so Dr. Terrace ArabiaYan has also stopped that medication.  The patient has failed multiple oral medication therapies and Dr. Terrace ArabiaYan has offered Botox.  The patient is not interested in proceeding with Botox.  Dr. Terrace ArabiaYan has offered a [facility].  The patient would like to check with her insurance to see if they are in network and call us back if she decides to accept the referral.

## 2017-03-02 NOTE — Telephone Encounter (Signed)
Rx resent.

## 2017-03-02 NOTE — Telephone Encounter (Signed)
Pt is still waiting for trazodone to be send to [facility] [address]

## 2017-03-02 NOTE — Telephone Encounter (Signed)
Patient called office in reference to butalbital-acetaminophen-caffeine (FIORICET, ESGIC) 50-325-40 MG tablet.  Patient states she is intense stomach pains even with taking nausea medication.  Patient would like to see about changing the medication. Also medication propranolol (INDERAL) 40 MG tablet per patient she said this medication has dropped her BP and she is not comfortable with the readings.  Please call

## 2017-03-28 DIAGNOSIS — IMO0001 Reserved for inherently not codable concepts without codable children: Secondary | ICD-10-CM | POA: Insufficient documentation

## 2017-03-28 NOTE — Progress Notes (Signed)
HPI:   Meredith Byrd is a 36 y.o. female, who is here today with her husband to follow on some chronic medical problems.  She was last seen on 01/19/17, when she was referred to neuro for migraine management. Also some changes in her meds were made, she was supposed to have 4 weeks follow up but because she could not afford her co-pay and after she reported no side effects from medications, we adjusted meds and re-scheduled f/u appt.  She is on Tramadol and Fioricet for migraine treatment.  Insomnia: Last OV Trazodone decreased from 150 mg to 100 mg because Cymbalta was started. A few weeks ago after Cymbalta was increased from 30 mg to 60 mg, Trazodone was decreased to 50 mg. Sleeping about 5-6 hours, waking up once and not able to go back to sleep since Trazodone was decreased.  She is upset because Cymbalta was still increased despite the fact she was having side effects. It did not help at all with back pain and she had "intense thoughts", no motivation, and vivid dreams. I called to her attention these side effects where not report, I went through all the phone calls since her last OV.   Obesity: Dietary changes since her last OV: None. Exercise: None.  S/P bariatric surgery. She is on Depo Provera for birth control.   Concerns today:   "Cracked teeth", she noted 2 weeks ago and reporting having "a lot of pain." She has not arranged appt with her dentist. She denies recent trauma.  Back pain: She is also c/o worsening lower back pain, it is still radiated to RLE, numbness on anterior aspect of thigh.Last OV she reported that pain was going on for 3-4 months.  Sharp, intermittent, 9/10. Exacerbated by prolonged walking,standing,and sitting. No alleviating factors identified. She denies saddle anesthesia or urine/bowel incontinence.  She also has arthralgias, MCP joint pain, bilateral.    Review of Systems  Constitutional: Positive for fatigue. Negative  for activity change, appetite change and fever.  HENT: Negative for mouth sores, sore throat and trouble swallowing.   Respiratory: Negative for cough, chest tightness, shortness of breath and wheezing.   Cardiovascular: Negative for leg swelling.  Gastrointestinal: Negative for abdominal pain, blood in stool, nausea and vomiting.       Negative for changes in bowel habits or fecal incontinence.  Endocrine: Negative for cold intolerance, heat intolerance, polydipsia, polyphagia and polyuria.  Genitourinary: Negative for decreased urine volume, dysuria and hematuria.       Negative for urine incontinence.  Musculoskeletal: Positive for arthralgias and back pain. Negative for gait problem and joint swelling.  Skin: Negative for rash.  Neurological: Positive for numbness. Negative for syncope, weakness and headaches.  Psychiatric/Behavioral: Positive for sleep disturbance. Negative for confusion and suicidal ideas. The patient is nervous/anxious.       Current Outpatient Prescriptions on File Prior to Visit  Medication Sig Dispense Refill  . butorphanol (STADOL) 10 MG/ML nasal spray Place 1 spray into the nose every 4 (four) hours as needed for headache.    . MedroxyPROGESTERone Acetate (DEPO-PROVERA IM) Inject into the muscle.    [name] omeprazole (PRILOSEC) 40 MG capsule Take 40 mg by mouth daily.    . propranolol (INDERAL) 40 MG tablet Take 1 tablet (40 mg total) by mouth 2 (two) times daily. 60 tablet 11  . traMADol (ULTRAM) 50 MG tablet As needed for migraine, do not refill in less than 30 days 30 tablet 2  .  Vitamin D, Ergocalciferol, (DRISDOL) 50000 UNITS CAPS capsule Take 50,000 Units by mouth every 7 (seven) days.     No current facility-administered medications on file prior to visit.      Past Medical History:  Diagnosis Date  . Allergy   . Anemia   . Arthritis pain 08/20/2014  . GERD (gastroesophageal reflux disease)   . History of stress test 02/2011 ([facility])   there was no  evidence of ischemia, but she only went 3 1/2 minutes on the treadmill making it very difficult to get a good accurate assessment, however  . Hx of echocardiogram    The echocadiogram was essentially normal with the exception of mild mitral calcification and borderline concentric LVH, which in the setting of her hypertension at this early age is something that mean her blood pressure is well controlled.   . Hypertension   . Iron deficiency anemia, unspecified 07/09/2014  . Migraines   . Palpitations   . Vitamin B12 deficiency 03/17/2016   Allergies  Allergen Reactions  . Fioricet [Butalbital-Apap-Caffeine] Other (See Comments)    GI upset    Social History   Social History  . Marital status: Married    Spouse name: Damon  . Number of children: 4  . Years of education: Associates   Occupational History  . Insurance Claims    Social History Main Topics  . Smoking status: Never Smoker  . Smokeless tobacco: Never Used  . Alcohol use No  . Drug use: No  . Sexual activity: Yes   Other Topics Concern  . None   Social History Narrative   Right handed.   Lives at home with husband and four children.   No caffeine use.    Vitals:   03/29/17 1545  BP: 132/80  Pulse: (!) 101  Resp: 12   Body mass index is 46.54 kg/m.  Wt Readings from Last 3 Encounters:  03/29/17 242 lb 5 oz (109.9 kg)  02/09/17 236 lb 8 oz (107.3 kg)  01/19/17 236 lb 6 oz (107.2 kg)    Physical Exam  Nursing note and vitals reviewed. Constitutional: She is oriented to person, place, and time. She appears well-developed. She does not appear ill. No distress.  HENT:  Head: Atraumatic.  Mouth/Throat: Oropharynx is clear and moist and mucous membranes are normal. No oral lesions. Abnormal dentition. Dental caries present. No dental abscesses.  Eyes: Conjunctivae and EOM are normal. Pupils are equal, round, and reactive to light.  Cardiovascular: Regular rhythm.  Tachycardia present.   No murmur  heard. Respiratory: Effort normal and breath sounds normal. No respiratory distress.  GI: Soft. She exhibits no mass. There is no hepatomegaly. There is no tenderness.  Musculoskeletal: She exhibits no edema.       Lumbar back: She exhibits tenderness. She exhibits no bony tenderness.  No significant deformity appreciated. Tenderness upon palpation of lumbar paraspinal muscles, bilateral.  Lymphadenopathy:    She has no cervical adenopathy.  Neurological: She is alert and oriented to person, place, and time. She has normal strength. Gait normal.  Reflex Scores:      Patellar reflexes are 2+ on the right side and 2+ on the left side. SLR negative bilateral.  Skin: Skin is warm. No rash noted. No erythema.  Psychiatric: Her mood appears anxious. Her affect is blunt. She expresses no suicidal ideation. She expresses no suicidal plans.  Appropriately groomed, good eye contact.    ASSESSMENT AND PLAN:   Ms Nollie was seen today for  follow-up.  Diagnoses and all orders for this visit:  Insomnia, unspecified type  Good sleep hygiene. Trazodone increase from 50 mg to 100 mg at bedtime. We discussed some side effects and the risk of interaction with some of her current meds.  -     traZODone (DESYREL) 100 MG tablet; Take 1 tablet (100 mg total) by mouth at bedtime.  Class 3 obesity without serious comorbidity with body mass index (BMI) of 45.0 to 49.9 in adult, unspecified obesity type [facility](HCC)  She is not trying to change life style. She has gained 6 Lb since her last OV. We discussed benefits of wt loss as well as adverse effects of obesity. Depo Provera could contribute to wt gain. Consistency with healthy diet and physical activity recommended. Daily brisk walking for 15-30 min as tolerated.  Chronic low back pain with right-sided sciatica, unspecified back pain laterality  I recommended evaluation by ortho or neurosurgeon. She prefers to hold on referral for now. She states that  about 2 years ago she was evaluated and had MRI done. Husband wonders if she can try Gabapentin. She has taken Lyrica and Gabapentin in the past, she tells me that Gabapentin helped, denies side effects. Some side effects discussed, including worsening depression and wt gain, she still would like to try.  -     gabapentin (NEURONTIN) 300 MG capsule; Take 1 capsule (300 mg total) by mouth at bedtime.  Toothache  I do not appreciate signs of dental abscess. Strongly recommend arranging apt with her dentist.    -Ms. Bobby Mcneil SoberM Atlas was advised to return sooner than planned today if new concerns arise.       Denna Fryberger G. [place]Jordan, MD  [facility]. [place] office.

## 2017-03-29 ENCOUNTER — Ambulatory Visit (INDEPENDENT_AMBULATORY_CARE_PROVIDER_SITE_OTHER): Payer: 59 | Admitting: Family Medicine

## 2017-03-29 ENCOUNTER — Encounter: Payer: Self-pay | Admitting: Family Medicine

## 2017-03-29 VITALS — BP 132/80 | HR 101 | Resp 12 | Ht 60.5 in | Wt 242.3 lb

## 2017-03-29 DIAGNOSIS — G4700 Insomnia, unspecified: Secondary | ICD-10-CM

## 2017-03-29 DIAGNOSIS — IMO0001 Reserved for inherently not codable concepts without codable children: Secondary | ICD-10-CM

## 2017-03-29 DIAGNOSIS — M5441 Lumbago with sciatica, right side: Secondary | ICD-10-CM | POA: Diagnosis not present

## 2017-03-29 DIAGNOSIS — E669 Obesity, unspecified: Secondary | ICD-10-CM

## 2017-03-29 DIAGNOSIS — G8929 Other chronic pain: Secondary | ICD-10-CM | POA: Diagnosis not present

## 2017-03-29 DIAGNOSIS — K0889 Other specified disorders of teeth and supporting structures: Secondary | ICD-10-CM

## 2017-03-29 DIAGNOSIS — Z6841 Body Mass Index (BMI) 40.0 and over, adult: Secondary | ICD-10-CM | POA: Diagnosis not present

## 2017-03-29 MED ORDER — GABAPENTIN 300 MG PO CAPS: 300.0000 mg | ORAL_CAPSULE | Freq: Every day | ORAL | 1 refills | Status: DC

## 2017-03-29 MED ORDER — TRAZODONE HCL 100 MG PO TABS: 100.0000 mg | ORAL_TABLET | Freq: Every day | ORAL | 2 refills | Status: DC

## 2017-03-29 NOTE — Patient Instructions (Addendum)
A few things to remember from today's visit:   Insomnia, unspecified type  Vitamin D deficiency  Class 3 obesity without serious comorbidity with body mass index (BMI) of 45.0 to 49.9 in adult, unspecified obesity type (HCC)  Chronic low back pain with right-sided sciatica, unspecified back pain laterality  Start Vit D. Follow with neuro. Appointment with dentists recommended.  In about 4-6 weeks please let me know through My Chart or by calling the office about tolerance of new medication.    What are some tips for weight loss? People become overweight for many reasons. Weight issues can run in families. They can be caused by unhealthy behaviors and a person's environment. Certain health problems and medicines can also lead to weight gain. There are some simple things you can do to reach and maintain a healthy weight:  Eat small more frequent healthy meals instead 3 bid meals. Also Weight Watchers is a good option. Avoid sweet drinks. These include regular soft drinks, fruit juices, fruit drinks, energy drinks, sweetened iced tea, and flavored milk. Avoid fast foods. Fast foods such as french fries, hamburgers, chicken nuggets, and pizza are high in calories and can cause weight gain. Eat a healthy breakfast. People who skip breakfast tend to weigh more. Don't watch more than two hours of television per day. Chew sugar-free gum between meals to cut down on snacking. Avoid grocery shopping when you're hungry. Pack a healthy lunch instead of eating out to control what and how much you eat. Eat a lot of fruits and vegetables. Aim for about 2 cups of fruit and 2 to 3 cups of vegetables per day. Aim for 150 minutes per week of moderate-intensity exercise (such as brisk walking), or 75 minutes per week of vigorous exercise (such as jogging or running). OR 15-30 min of daily brisk walking. Be more active. Small changes in physical activity can easily be added to your daily routine. For  example, take the stairs instead of the elevator. Take a walk with your family. A daily walk is a great way to get exercise and to catch up on the day's events.   Please be sure medication list is accurate. If a new problem present, please set up appointment sooner than planned today.

## 2017-05-04 ENCOUNTER — Encounter: Payer: Self-pay | Admitting: Nurse Practitioner

## 2017-05-04 ENCOUNTER — Ambulatory Visit (INDEPENDENT_AMBULATORY_CARE_PROVIDER_SITE_OTHER): Payer: 59 | Admitting: Nurse Practitioner

## 2017-05-04 VITALS — BP 124/82 | HR 89 | Wt 247.4 lb

## 2017-05-04 DIAGNOSIS — G43109 Migraine with aura, not intractable, without status migrainosus: Secondary | ICD-10-CM

## 2017-05-04 DIAGNOSIS — R400 Somnolence: Secondary | ICD-10-CM | POA: Diagnosis not present

## 2017-05-04 DIAGNOSIS — R519 Headache, unspecified: Secondary | ICD-10-CM | POA: Insufficient documentation

## 2017-05-04 DIAGNOSIS — R51 Headache: Secondary | ICD-10-CM | POA: Diagnosis not present

## 2017-05-04 MED ORDER — TRAMADOL HCL 50 MG PO TABS: ORAL_TABLET | ORAL | 1 refills | Status: DC

## 2017-05-04 NOTE — Progress Notes (Signed)
[facility]  PATIENT: Meredith Byrd DOB: 1981-05-20   REASON FOR VISIT: follow up for migraine HISTORY FROM:patient    HISTORY OF PRESENT ILLNESS:Meredith Byrd 36 years old right-handed African-American female, is referred by her primary care physician nurse practitioner Marva Panda for evaluation of migraine headaches.Initial evaluation was in February 2016.  She had long-standing history of migraine, since elementary school, her typical migraine are lateralized severe pounding headache with associated light noise sensitivity, nauseous, can last for few days, even after few weeks. She had a history of gastric bypass, lost 60 pounds in 2008, her weight has been stable  I saw her previously, while taking Topamax, her headache is much less frequent, less severe, but she has not take any preventive medications since 2014.  She now complains of increased headache frequency, since 2014, she is having 3-4 typical migraine headaches each week, each episode last for a few days, in January, she has one severe episode of migraine lasting for 2 weeks. She was not able to identify triggers, she has tried triptan treatment in the past, Imitrex, cause heart racing, tightness, Maxalt, similar side effect, without helping her headache much.  Over the past 18 months, she was given Stadol nasal spray for headaches, she gets 3 bottles of 10 mg Stadol nasal spray each month, this was prescribed by her previous primary care physician [facility], but always has recurrent headaches shortly afterwards.  UPDATE March 24th 2016: She reported only temporary partial relief with previous nerve block in November 23 2014, she had tried Zomig nasal spray 10 times, without helping her headaches, Previously Preventive medications, Topamax works for her headaches, but she is trying to conceiving again, does not want to take too much medications, I have went over  other possibilities including Inderal, nortriptyline, all belongs to category C, I also advised her avoiding triptan, and Stadol treatment during pregnancy,  She had 4 children, no significant headache during her previous pregnancy  UPDATE Feb 09 2017: She continue complains of frequent migraines about 10-15 days each month, sometimes preceded by visual distortion, usually short lasting, but sometimes she woke up with severe migraine headache, last visit was in March 2016, over the past 2 years, she was managed by her primary care physician, per patient, she was given prescription of Stadol, tramadol, but we were not able to find her refill information from [facility].  She states she has tried and failed multiple preventative medications, Topamax,cause too much memory issues, almost lost her job,  For abortive treatment she has tried Maxalt, Zomig, sumatriptan, none of above works for her, some of the medication even made it more intense. UPDATE 07/24/2018CM Meredith Byrd, 36 year old female returns for follow up with history of migraines. She has approx 15 headaches per month. She has failed multiple preventives to include Topamax, Inderal and acute medications of Imitrex, Stadol Maxalt and Zomig nasal spray. On further questioning of headaches, she wakes up with headaches in the morning, she complains of fatigue and daytime drowsiness. She has never had a sleep study. She is obese with BMI of 47 I discussed sleep study with her. She has never tried preventive med Depakote. She returns for reevaluation  REVIEW OF SYSTEMS: Full 14 system review of systems performed and notable only for those listed, all others are neg:  Constitutional: fatigue Cardiovascular: neg Ear/Nose/Throat: neg  Skin: neg Eyes: blurred vision, light sensitivity Respiratory: neg Gastroitestinal: neg  Hematology/Lymphatic: neg  Endocrine: neg Musculoskeletal:neg Allergy/Immunology: neg Neurological:  migraines Psychiatric: neg Sleep : morning headache, daytime drowsiness   ALLERGIES: Allergies  Allergen Reactions  . Fioricet [Butalbital-Apap-Caffeine] Other (See Comments)    GI upset  . Inderal [Propranolol] Nausea Only    Dropped BP too low    HOME MEDICATIONS: Outpatient Medications Prior to Visit  Medication Sig Dispense Refill  . butorphanol (STADOL) 10 MG/ML nasal spray Place 1 spray into the nose every 4 (four) hours as needed for headache.    . gabapentin (NEURONTIN) 300 MG capsule Take 1 capsule (300 mg total) by mouth at bedtime. 30 capsule 1  . omeprazole (PRILOSEC) 40 MG capsule Take 40 mg by mouth daily.    . traMADol (ULTRAM) 50 MG tablet As needed for migraine, do not refill in less than 30 days 30 tablet 2  . traZODone (DESYREL) 100 MG tablet Take 1 tablet (100 mg total) by mouth at bedtime. 30 tablet 2  . Vitamin D, Ergocalciferol, (DRISDOL) 50000 UNITS CAPS capsule Take 50,000 Units by mouth every 7 (seven) days.    . MedroxyPROGESTERone Acetate (DEPO-PROVERA IM) Inject into the muscle.    . propranolol (INDERAL) 40 MG tablet Take 1 tablet (40 mg total) by mouth 2 (two) times daily. 60 tablet 11   No facility-administered medications prior to visit.     PAST MEDICAL HISTORY: Past Medical History:  Diagnosis Date  . Allergy   . Anemia   . Arthritis pain 08/20/2014  . GERD (gastroesophageal reflux disease)   . History of stress test 02/2011 ([facility])   there was no evidence of ischemia, but she only went 3 1/2 minutes on the treadmill making it very difficult to get a good accurate assessment, however  . Hx of echocardiogram    The echocadiogram was essentially normal with the exception of mild mitral calcification and borderline concentric LVH, which in the setting of her hypertension at this early age is something that mean her blood pressure is well controlled.   . Hypertension   . Iron deficiency anemia, unspecified 07/09/2014  . Migraines   . Palpitations     . Vitamin B12 deficiency 03/17/2016    PAST SURGICAL HISTORY: Past Surgical History:  Procedure Laterality Date  . BREAST SURGERY    . CHOLECYSTECTOMY    . GASTRIC BYPASS  2008  . OOPHORECTOMY Right 2010    FAMILY HISTORY: Family History  Problem Relation Age of Onset  . Stroke Maternal Grandmother   . Heart disease Maternal Grandmother   . Hypertension Maternal Grandmother   . Hyperlipidemia Maternal Grandmother   . Asthma Mother   . Heart disease Mother   . Diabetes Mother   . Early death Mother   . Hypertension Mother   . Hypertension Father   . Heart disease Father     SOCIAL HISTORY: Social History   Social History  . Marital status: Married    Spouse name: Damon  . Number of children: 4  . Years of education: Associates   Occupational History  . Insurance Claims    Social History Main Topics  . Smoking status: Never Smoker  . Smokeless tobacco: Never Used  . Alcohol use No  . Drug use: No  . Sexual activity: Yes   Other Topics Concern  . Not on file   Social History Narrative   Right handed.   Lives at home with husband and four children.   No caffeine use.     PHYSICAL EXAM  Vitals:   05/04/17 1539  BP: 124/82  Pulse: 89  Weight: 247 lb 6.4 oz (112.2 kg)   Body mass index is 47.52 kg/m.  Generalized: Well developed, in no acute distress  Head: normocephalic and atraumatic,. Oropharynx benign  Neck: Supple, no carotid bruits  Cardiac: Regular rate rhythm, no murmur  Musculoskeletal: No deformity   Neurological examination   Mentation: Alert oriented to time, place, history taking. Attention span and concentration appropriate. Recent and remote memory intact.  Follows all commands speech and language fluent.   Cranial nerve II-XII: Pupils were equal round reactive to light extraocular movements were full, visual field were full on confrontational test. Facial sensation and strength were normal. hearing was intact to finger rubbing  bilaterally. Uvula tongue midline. head turning and shoulder shrug were normal and symmetric.Tongue protrusion into cheek strength was normal. Motor: normal bulk and tone, full strength in the BUE, BLE, fine finger movements normal, no pronator drift. No focal weakness Sensory: normal and symmetric to light touch, pinprick, and  Vibration,in the upper and lower extremities  Coordination: finger-nose-finger, heel-to-shin bilaterally, no dysmetria Reflexes: symmetric upper and lower plantar responses were flexor bilaterally. Gait and Station: Rising up from seated position without assistance, normal stance,  moderate stride, good arm swing, smooth turning, able to perform tiptoe, and heel walking without difficulty. Tandem gait is steady  DIAGNOSTIC DATA (LABS, IMAGING, TESTING) - I reviewed patient records, labs, notes, testing and imaging myself where available.  Lab Results  Component Value Date   WBC 6.0 01/19/2017   HGB 11.0 (L) 01/19/2017   HCT 35.3 (L) 01/19/2017   MCV 77.1 (L) 01/19/2017   PLT 440.0 (H) 01/19/2017      Component Value Date/Time   NA 138 01/19/2017 0857   K 4.0 01/19/2017 0857   CL 103 01/19/2017 0857   CO2 28 01/19/2017 0857   GLUCOSE 83 01/19/2017 0857   BUN 8 01/19/2017 0857   CREATININE 0.66 01/19/2017 0857   CREATININE 0.65 03/22/2014 1217   CALCIUM 8.9 01/19/2017 0857   PROT 6.6 06/07/2014 0359   ALBUMIN 3.3 (L) 06/07/2014 0359   AST 13 06/07/2014 0359   ALT 11 06/07/2014 0359   ALKPHOS 56 06/07/2014 0359   BILITOT 0.3 06/07/2014 0359   GFRNONAA >90 06/07/2014 0359   GFRNONAA >89 03/22/2014 1217   GFRAA >90 06/07/2014 0359   GFRAA >89 03/22/2014 1217   Lab Results  Component Value Date   CHOL 133 01/19/2017   HDL 55.40 01/19/2017   LDLCALC 65 01/19/2017   TRIG 67.0 01/19/2017   CHOLHDL 2 01/19/2017    Lab Results  Component Value Date   TSH 0.78 01/19/2017      ASSESSMENT AND PLAN  36 y.o. year old female  has a past medical  history of Anemia; Arthritis pain (08/20/2014);  Hypertension; Iron deficiency anemia,  Migraines; Palpitations; and Vitamin B12 deficiency (03/17/2016). here to follow up for chronic migraine headaches. She has tried and failed multiple preventives in the past.to include Topamax, Inderal and acute medications of Imitrex, Stadol Maxalt and Zomig nasal spray.                          PLAN;Will set up for sleep study due to morning headaches and fatigue daytime drowsiness May try Depakote in the future Will refill tramadol 50 mg tablets 301 when necessary for migraine Follow-up 4 months I explained in particular the risks and ramifications of untreated moderate to severe OSA, especially with respect to cardiovascular disease  including congestive heart failure, difficult to treat hypertension, cardiac arrhythmias, or stroke. Even type 2 diabetes has, in part, been linked to untreated OSA. Symptoms of untreated OSA include daytime sleepiness, memory problems, mood irritability and mood disorder such as depression and anxiety, lack of energy, as well as recurrent headaches, especially morning headaches. . I encouraged the patient to eat healthy, exercise daily and keep well hydrated, to keep a scheduled bedtime and wake time routine, to not skip any meals and eat healthy snacks in between meals I spent  in total face to face time with the patient more than 50% of which was spent counseling and coordination of care, reviewing test results reviewing medications and discussing and reviewing the diagnosis of untreated obstructive sleep apnea see above migraine Nilda Riggs, [facility], [facility], APRN  [facility] [address] [place], [place] [address] [phone]

## 2017-05-04 NOTE — Patient Instructions (Signed)
Will set up for sleep study due to morning headaches and fatigue daytime drowsiness Follow-up 4 months I explained in particular the risks and ramifications of untreated moderate to severe OSA, especially with respect to cardiovascular disease  including congestive heart failure, difficult to treat hypertension, cardiac arrhythmias, or stroke. Even type 2 diabetes has, in part, been linked to untreated OSA. Symptoms of untreated OSA include daytime sleepiness, memory problems, mood irritability and mood disorder such as depression and anxiety, lack of energy, as well as recurrent headaches, especially morning headaches. . I encouraged the patient to eat healthy, exercise daily and keep well hydrated, to keep a scheduled bedtime and wake time routine, to not skip any meals and eat healthy snacks in between meals

## 2017-05-04 NOTE — Progress Notes (Signed)
I have reviewed and agreed above plan. 

## 2017-05-24 ENCOUNTER — Other Ambulatory Visit: Payer: Self-pay | Admitting: Family Medicine

## 2017-05-24 DIAGNOSIS — G8929 Other chronic pain: Secondary | ICD-10-CM

## 2017-05-24 DIAGNOSIS — M5441 Lumbago with sciatica, right side: Principal | ICD-10-CM

## 2017-05-28 ENCOUNTER — Other Ambulatory Visit: Payer: Self-pay | Admitting: Family Medicine

## 2017-05-28 DIAGNOSIS — M25542 Pain in joints of left hand: Secondary | ICD-10-CM

## 2017-05-28 DIAGNOSIS — M25541 Pain in joints of right hand: Secondary | ICD-10-CM

## 2017-06-08 ENCOUNTER — Encounter: Payer: Self-pay | Admitting: Neurology

## 2017-06-08 ENCOUNTER — Ambulatory Visit (INDEPENDENT_AMBULATORY_CARE_PROVIDER_SITE_OTHER): Payer: Self-pay | Admitting: Neurology

## 2017-06-08 NOTE — Progress Notes (Signed)
This encounter was created in error - please disregard.Subjective:    Patient ID: Meredith Byrd is a 36 y.o. female.  HPI   Review of Systems  Neurological:       Pt presents today to discuss her sleep. Pt has never had a sleep study but does endorse occasional snoring. Pt wakes up with a headache in the mornings.  Epworth Sleepiness Scale 0= would never doze 1= slight chance of dozing 2= moderate chance of dozing 3= high chance of dozing  Sitting and reading: 0 Watching TV: 2 Sitting inactive in a public place (ex. Theater or meeting): 0 As a passenger in a car for an hour without a break: 2 Lying down to rest in the afternoon: 2 Sitting and talking to someone: 0 Sitting quietly after lunch (no alcohol): 1 In a car, while stopped in traffic: 0 Total: 7     Objective:  Neurological Exam  Physical Exam  Assessment:     Plan:

## 2017-06-09 ENCOUNTER — Telehealth: Payer: Self-pay

## 2017-06-09 NOTE — Telephone Encounter (Signed)
Pt returned RN's call. And appt has been scheduled for 9/5/@ 8am, pt will check-in at 7:30

## 2017-06-09 NOTE — Telephone Encounter (Signed)
I called pt to discuss getting her appt with Dr. Frances FurbishAthar rescheduled yesterday due to the power outage. No answer, left a message asking her to call me back. If pt calls back, please reschedule her as she is able, and we may use an 8:00am work in spot if pt would request an early appt. This is ok with Dr. Frances FurbishAthar.

## 2017-06-15 ENCOUNTER — Ambulatory Visit (INDEPENDENT_AMBULATORY_CARE_PROVIDER_SITE_OTHER): Payer: 59 | Admitting: Family Medicine

## 2017-06-15 ENCOUNTER — Encounter: Payer: Self-pay | Admitting: Family Medicine

## 2017-06-15 VITALS — BP 132/80 | HR 80 | Temp 98.6°F | Resp 12 | Ht 60.0 in | Wt 243.4 lb

## 2017-06-15 DIAGNOSIS — J029 Acute pharyngitis, unspecified: Secondary | ICD-10-CM | POA: Diagnosis not present

## 2017-06-15 DIAGNOSIS — G4700 Insomnia, unspecified: Secondary | ICD-10-CM | POA: Diagnosis not present

## 2017-06-15 DIAGNOSIS — M5441 Lumbago with sciatica, right side: Secondary | ICD-10-CM

## 2017-06-15 DIAGNOSIS — R51 Headache: Secondary | ICD-10-CM | POA: Diagnosis not present

## 2017-06-15 DIAGNOSIS — R1310 Dysphagia, unspecified: Secondary | ICD-10-CM

## 2017-06-15 DIAGNOSIS — R519 Headache, unspecified: Secondary | ICD-10-CM

## 2017-06-15 DIAGNOSIS — K219 Gastro-esophageal reflux disease without esophagitis: Secondary | ICD-10-CM | POA: Diagnosis not present

## 2017-06-15 DIAGNOSIS — R05 Cough: Secondary | ICD-10-CM | POA: Diagnosis not present

## 2017-06-15 DIAGNOSIS — J069 Acute upper respiratory infection, unspecified: Secondary | ICD-10-CM | POA: Diagnosis not present

## 2017-06-15 DIAGNOSIS — G8929 Other chronic pain: Secondary | ICD-10-CM | POA: Diagnosis not present

## 2017-06-15 DIAGNOSIS — R059 Cough, unspecified: Secondary | ICD-10-CM

## 2017-06-15 LAB — POCT RAPID STREP A (OFFICE): RAPID STREP A SCREEN: NEGATIVE

## 2017-06-15 MED ORDER — BENZONATATE 100 MG PO CAPS: 200.0000 mg | ORAL_CAPSULE | Freq: Two times a day (BID) | ORAL | 0 refills | Status: AC | PRN

## 2017-06-15 MED ORDER — RANITIDINE HCL 300 MG PO TABS: 300.0000 mg | ORAL_TABLET | Freq: Every day | ORAL | 2 refills | Status: DC

## 2017-06-15 MED ORDER — METHOCARBAMOL 500 MG PO TABS: 500.0000 mg | ORAL_TABLET | Freq: Two times a day (BID) | ORAL | 1 refills | Status: DC | PRN

## 2017-06-15 MED ORDER — FLUTICASONE PROPIONATE 50 MCG/ACT NA SUSP: 1.0000 | Freq: Two times a day (BID) | NASAL | 2 refills | Status: DC

## 2017-06-15 MED ORDER — KETOROLAC TROMETHAMINE 60 MG/2ML IM SOLN
60.0000 mg | Freq: Once | INTRAMUSCULAR | Status: AC
  Administered 2017-06-15: 60 mg via INTRAMUSCULAR

## 2017-06-15 NOTE — Progress Notes (Signed)
ACUTE VISIT   HPI:  Chief Complaint  Patient presents with  . "Ongoing personal issues    [name] is a 36 y.o. female, who is here today complaining of back pain, sore throat, feeling food "stuck" in her throat, and cough.  3 days of "severe cough" and sore throat.  Her six-year-old child has been sick recently. No recent travel. She tried honey with lemon but didn't help with cough.  She is requesting prescription to help with the cough.  Sore Throat   This is a new problem. The current episode started in the past 7 days. The problem has been unchanged. There has been no fever. The pain is moderate. Associated symptoms include congestion, coughing, headaches and trouble swallowing. Pertinent negatives include no abdominal pain, diarrhea, ear pain, hoarse voice, neck pain, shortness of breath, stridor, swollen glands or vomiting. She has had no exposure to strep or mono. She has tried nothing for the symptoms.   I saw her on 03/29/2017, when Trazodone was increased from 50 mg to 100 mg at bedtime for insomnia. She is sleeping better, 6-7 hours a night. According to patient, she is having sleep study consultation tomorrow.  Back pain: Hx of back pain,intermittent radiation to both lower extremities, numbness that happens about once or twice per month. Pain is exacerbated by prolonged walking, prolonged standing, prolonged sitting, and when laying in bed.  She denies saddle anesthesia or urine/bowel incontinence. Pain is sharp, constant, 9/10. Last office visit she refused orthopedist referral.  Gabapentin 300 mg was started at bedtime, she has not noted any improvement in regard to back pain or "sciatic" pain.  She didn't tolerate Cymbalta in the past.  GERD: She is reporting difficulty swallowing solids in general, stable for the past 3-4 years. According to patient, she had a EGD 4-5 years ago with Dr Sherri RadHang. She is currently on Omeprazole 40 mg twice  daily. Non productive cough, worse at night. + Burping. No heartburn. In the past she has taken Nexium and Protonix, they didn't help. This patient does not happen with liquids. She is afraid that symptoms might be caused by enlarged thyroid. She denies stridor or wheezing. Occasional nausea. She has not identified alleviating factors.  Denies abdominal pain,vomiting, changes in bowel habits, blood in stool or melena.  01/25/17 thyroid US: 1.0 cm solid hypoechoic right inferior thyroid nodule (TR 4 nodule).This nodule meets criteria for follow-up at 1 year.No other significant thyroid abnormality.   Lab Results  Component Value Date   TSH 0.78 01/19/2017    Headache: She is also requesting a Toradol shot for frontal headache. 7/10, no associated nausea or visual changes. She has not taking any NSAIDs today, she denies any side effects when she received Toradol in the past. History of migraine, she follows up with Dr. Terrace ArabiaYan, neurologist.   Review of Systems  Constitutional: Positive for fatigue. Negative for activity change, appetite change, fever and unexpected weight change.  HENT: Positive for congestion, postnasal drip, rhinorrhea, sore throat, tinnitus and trouble swallowing. Negative for ear pain, facial swelling, hoarse voice, mouth sores, sinus pain and voice change.   Eyes: Negative for redness and visual disturbance.  Respiratory: Positive for cough. Negative for shortness of breath, wheezing and stridor.   Cardiovascular: Negative for leg swelling.  Gastrointestinal: Positive for nausea. Negative for abdominal pain, diarrhea and vomiting.       Negative for changes in bowel habits.  Genitourinary: Negative for decreased urine volume, dysuria  and hematuria.  Musculoskeletal: Positive for back pain and myalgias. Negative for gait problem and neck pain.  Skin: Negative for pallor and rash.  Allergic/Immunologic: Positive for environmental allergies.  Neurological: Positive  for numbness (Intermittent RLE) and headaches. Negative for syncope and weakness.  Hematological: Negative for adenopathy. Does not bruise/bleed easily.  Psychiatric/Behavioral: Positive for sleep disturbance. Negative for confusion. The patient is nervous/anxious.       Current Outpatient Prescriptions on File Prior to Visit  Medication Sig Dispense Refill  . butorphanol (STADOL) 10 MG/ML nasal spray Place 1 spray into the nose every 4 (four) hours as needed for headache.    . gabapentin (NEURONTIN) 300 MG capsule TAKE ONE CAPSULE BY MOUTH AT BEDTIME 30 capsule 1  . omeprazole (PRILOSEC) 40 MG capsule Take 40 mg by mouth daily.    . traMADol (ULTRAM) 50 MG tablet As needed for migraine, do not refill in less than 30 days 30 tablet 1  . traZODone (DESYREL) 100 MG tablet Take 1 tablet (100 mg total) by mouth at bedtime. 30 tablet 2  . Vitamin D, Ergocalciferol, (DRISDOL) 50000 UNITS CAPS capsule Take 50,000 Units by mouth every 7 (seven) days.     No current facility-administered medications on file prior to visit.      Past Medical History:  Diagnosis Date  . Allergy   . Anemia   . Arthritis pain 08/20/2014  . GERD (gastroesophageal reflux disease)   . History of stress test 02/2011 ([facility])   there was no evidence of ischemia, but she only went 3 1/2 minutes on the treadmill making it very difficult to get a good accurate assessment, however  . Hx of echocardiogram    The echocadiogram was essentially normal with the exception of mild mitral calcification and borderline concentric LVH, which in the setting of her hypertension at this early age is something that mean her blood pressure is well controlled.   . Hypertension   . Iron deficiency anemia, unspecified 07/09/2014  . Migraines   . Palpitations   . Vitamin B12 deficiency 03/17/2016   Allergies  Allergen Reactions  . Fioricet [Butalbital-Apap-Caffeine] Other (See Comments)    GI upset  . Inderal [Propranolol] Nausea Only     Dropped BP too low    Social History   Social History  . Marital status: Married    Spouse name: Damon  . Number of children: 4  . Years of education: Associates   Occupational History  . Insurance Claims    Social History Main Topics  . Smoking status: Never Smoker  . Smokeless tobacco: Never Used  . Alcohol use No  . Drug use: No  . Sexual activity: Yes   Other Topics Concern  . None   Social History Narrative   Right handed.   Lives at home with husband and four children.   No caffeine use.    Vitals:   06/15/17 1611  BP: 132/80  Pulse: 80  Resp: 12  Temp: 98.6 F (37 C)   Body mass index is 47.53 kg/m.   Physical Exam  Nursing note and vitals reviewed. Constitutional: She is oriented to person, place, and time. She appears well-developed. She does not appear ill. No distress.  HENT:  Head: Normocephalic and atraumatic.  Nose: Rhinorrhea and septal deviation present.  Mouth/Throat: Uvula is midline. Posterior oropharyngeal erythema (mild) present. No oropharyngeal exudate or posterior oropharyngeal edema.  Hypertrophic turbinates. Nasal drainage.  Eyes: Pupils are equal, round, and reactive to light. Conjunctivae  are normal.  Neck: No tracheal deviation present. Thyromegaly present.  Respiratory: Effort normal and breath sounds normal. No stridor. No respiratory distress.  GI: Soft. She exhibits no mass. There is no hepatomegaly. There is no tenderness.  Musculoskeletal: She exhibits no edema.  Mild tenderness upon palpation of paraspinal muscles, L1-L5, bilateral.   Lymphadenopathy:    She has cervical adenopathy (< 1 cm).       Right cervical: Posterior cervical adenopathy present.       Left cervical: Posterior cervical adenopathy present.  Neurological: She is alert and oriented to person, place, and time. She has normal strength. Coordination and gait normal.  Reflex Scores:      Patellar reflexes are 2+ on the right side and 2+ on the left  side. SLR negative bilateral.  Skin: Skin is warm. No rash noted. No erythema.  Psychiatric: Her mood appears anxious.  Well groomed, good eye contact.     ASSESSMENT AND PLAN:   [name] was seen today for "ongoing personal issues".  Diagnoses and all orders for this visit:  Dysphagia, unspecified type  Chronic and reported as stable. We discussed possible etiologies, I think it is unlikely to be related with thyromegaly. GI referral placed.  -     Ambulatory referral to Gastroenterology  Chronic low back pain with right-sided sciatica, unspecified back pain laterality  We discussed options at this time, she still does not want to see orthopedist or neurosurgeon. We discussed other pharmacologic treatment options, she would like to add a muscle relaxant, some side effects discussed. No changes on Gabapentin. Weight loss would help. Instructed to let me know in about 4 weeks if muscle relaxant has not helped, in which case I will place referral to orthopedist. Instructed about warning signs.  -     methocarbamol (ROBAXIN) 500 MG tablet; Take 1 tablet (500 mg total) by mouth 2 (two) times daily as needed for muscle spasms.  Insomnia, unspecified type  Improved with Trazodone 100 mg, no changes. Good sleep hygiene. Follow-up in 5 months.  Gastroesophageal reflux disease, esophagitis presence not specified  GERD precautions discussed. She has tried different PPIs, Omeprazole seems to help the most. Zantac 300 mg added at bedtime. GI appointment will be arranged.  -     Ambulatory referral to Gastroenterology -     ranitidine (ZANTAC) 300 MG tablet; Take 1 tablet (300 mg total) by mouth at bedtime.  Acute pharyngitis, unspecified etiology  Symptomatic treatment recommended. Further recommendations would be given according to strep culture. Follow-up as needed.  -     POC Rapid Strep A -     Culture, Group A Strep; Future  Cough  Worse with URI  symptoms. Some possible etiologies for chronic nonproductive cough discussed, including allergies and GI issues. Zantac added at the time and Flonase nasal spray recommended. I don't think imaging is needed at this time.  -     benzonatate (TESSALON) 100 MG capsule; Take 2 capsules (200 mg total) by mouth 2 (two) times daily as needed.  URI, acute  Most likely viral. Symptomatic treatment recommended. Explained that some symptoms like cough and congestion may last a few weeks. Follow-up as needed.  -     benzonatate (TESSALON) 100 MG capsule; Take 2 capsules (200 mg total) by mouth 2 (two) times daily as needed. -     fluticasone (FLONASE) 50 MCG/ACT nasal spray; Place 1 spray into both nostrils 2 (two) times daily.  Frontal headache  Could be  related to URI, allergies, or migraine. As requested she received Toradol 60 mg IM. Instructed about warning signs. Continue following with neurologist for chronic headache.  -     ketorolac (TORADOL) injection 60 mg; Inject 2 mLs (60 mg total) into the muscle once.    Return in about 5 months (around 11/15/2017) for Insomnia.    -Ms.Coraline HEIKE POUNDS was advised to seek immediate medical attention if sudden worsening symptoms.     Meredith G. [place], MD  [facility]. [place] office.

## 2017-06-15 NOTE — Patient Instructions (Signed)
A few things to remember from today's visit:   Chronic low back pain with right-sided sciatica, unspecified back pain laterality - Plan: ketorolac (TORADOL) injection 60 mg, methocarbamol (ROBAXIN) 500 MG tablet  Insomnia, unspecified type  Acute pharyngitis, unspecified etiology - Plan: POC Rapid Strep A, Culture, Group A Strep  Dysphagia, unspecified type - Plan: Ambulatory referral to Gastroenterology  Cough - Plan: benzonatate (TESSALON) 100 MG capsule   Zantac 300 mg at bedtime added. Methocarbamol twice daily as needed added today for back pain. Please let me know in about 4 weeks if pain has improved, otherwise we can arrange orthopedics to visit.  No changes on gabapentin. No changes on omeprazole.     Avoid foods that make your symptoms worse, for example coffee, chocolate,pepermeint,alcohol, and greasy food. Raising the head of your bed about 6 inches may help with nocturnal symptoms.  Avoid tobacco use. Weight loss (if you are overweight). Avoid lying down for 3 hours after eating.  Instead 3 large meals daily try small and more frequent meals during the day.  Every medication have side effects and medications for GERD are not the exception.At this time I think benefit is greater than risk.    You should be evaluated immediately if bloody vomiting, bloody stools, black stools (like tar), difficulty swallowing, food gets stuck on the way down or choking when eating. Abnormal weight loss or severe abdominal pain.  If symptoms are not resolved sometimes endoscopy is necessary.  Please be sure medication list is accurate. If a new problem present, please set up appointment sooner than planned today.

## 2017-06-16 ENCOUNTER — Other Ambulatory Visit: Payer: Self-pay | Admitting: Family Medicine

## 2017-06-16 ENCOUNTER — Institutional Professional Consult (permissible substitution): Payer: Self-pay | Admitting: Neurology

## 2017-06-16 ENCOUNTER — Telehealth: Payer: Self-pay | Admitting: Family Medicine

## 2017-06-16 DIAGNOSIS — G8929 Other chronic pain: Secondary | ICD-10-CM

## 2017-06-16 DIAGNOSIS — M5441 Lumbago with sciatica, right side: Principal | ICD-10-CM

## 2017-06-16 MED ORDER — TIZANIDINE HCL 4 MG PO CAPS: 4.0000 mg | ORAL_CAPSULE | Freq: Two times a day (BID) | ORAL | 1 refills | Status: DC | PRN

## 2017-06-16 NOTE — Telephone Encounter (Signed)
° ° °  Pt said the pharmacy told her the below med is on back order and is asking if there is something else that can be rx   methocarbamol (ROBAXIN) 500 MG tablet   Pharmacy ; [facility] [address]

## 2017-06-16 NOTE — Telephone Encounter (Signed)
Methocarbamol cancelled and Zanaflex Rx sent to her pharmacy.  Thanks, BJ

## 2017-06-16 NOTE — Telephone Encounter (Signed)
Pt did not show for her appt today with Dr. Frances FurbishAthar.

## 2017-06-17 NOTE — Telephone Encounter (Signed)
Pt is calling to give muscle relaxants that do not need PA that she received from her insurance company orphenadrine, tizanidine,netalone,thlorzoxazone carisoprodol    Pharm: [facility] [address]

## 2017-06-18 MED ORDER — TIZANIDINE HCL 4 MG PO TABS: 4.0000 mg | ORAL_TABLET | Freq: Two times a day (BID) | ORAL | 1 refills | Status: DC

## 2017-06-18 NOTE — Telephone Encounter (Signed)
I called and spoke with pharmacy. Patient's insurance will not cover the tizanidine capsules, so Rx changed to tablets. Pharmacy will cancel Rx for capsules and fill the Rx for tablets when it comes through. Nothing further needed.

## 2017-06-21 ENCOUNTER — Telehealth: Payer: Self-pay | Admitting: *Deleted

## 2017-06-21 ENCOUNTER — Telehealth: Payer: Self-pay | Admitting: Family Medicine

## 2017-06-21 ENCOUNTER — Other Ambulatory Visit: Payer: Self-pay

## 2017-06-21 DIAGNOSIS — G8929 Other chronic pain: Secondary | ICD-10-CM

## 2017-06-21 DIAGNOSIS — M5441 Lumbago with sciatica, right side: Principal | ICD-10-CM

## 2017-06-21 MED ORDER — GABAPENTIN 300 MG PO CAPS: 300.0000 mg | ORAL_CAPSULE | Freq: Every day | ORAL | 0 refills | Status: DC

## 2017-06-21 NOTE — Telephone Encounter (Signed)
[employer] FML[employer] papers on C Martin's desk for review, completion, signature.

## 2017-06-21 NOTE — Telephone Encounter (Signed)
OTC Mucinex may help. OTC expectorants like Robitussin are also available OTC. Cough related to URI's can last a few weeks. It can also be aggravated by GERD (GI appt pending).  CXR can be arranged to evaluated for lung process even though lung auscultation was normal.  Thanks

## 2017-06-21 NOTE — Telephone Encounter (Signed)
Noted  

## 2017-06-21 NOTE — Telephone Encounter (Signed)
I called and spoke with patient. She has already been taking the Mucinex and the Robitussin along with the benzonatate and she has tried a few other OTC items. She does not want the chest x-ray at the moment. She will go see GI first and then decide on the x-ray.

## 2017-06-21 NOTE — Telephone Encounter (Signed)
Pt states she is still coughing and the benzonatate (TESSALON) 100 MG capsule  Is not working.  Pt states she needs an expectorant.

## 2017-06-22 ENCOUNTER — Encounter: Payer: Self-pay | Admitting: Neurology

## 2017-06-22 ENCOUNTER — Encounter (INDEPENDENT_AMBULATORY_CARE_PROVIDER_SITE_OTHER): Payer: 59

## 2017-06-22 NOTE — Telephone Encounter (Signed)
[employer] papers completed, signed, sent to medical records for processing.  

## 2017-06-23 NOTE — Telephone Encounter (Signed)
Lm on VM advising patient [employer] FML[employer] paperwork has been completed and placed up front for pick up.  Also advised she can pay the $50 fee for completing when picking up forms.

## 2017-06-25 ENCOUNTER — Other Ambulatory Visit: Payer: Self-pay

## 2017-06-25 DIAGNOSIS — G4700 Insomnia, unspecified: Secondary | ICD-10-CM

## 2017-06-25 MED ORDER — TRAZODONE HCL 100 MG PO TABS: 100.0000 mg | ORAL_TABLET | Freq: Every day | ORAL | 1 refills | Status: DC

## 2017-06-29 ENCOUNTER — Encounter (INDEPENDENT_AMBULATORY_CARE_PROVIDER_SITE_OTHER): Payer: Self-pay | Admitting: Family Medicine

## 2017-06-29 ENCOUNTER — Ambulatory Visit (INDEPENDENT_AMBULATORY_CARE_PROVIDER_SITE_OTHER): Payer: 59 | Admitting: Family Medicine

## 2017-06-29 ENCOUNTER — Ambulatory Visit: Payer: 59 | Admitting: Family Medicine

## 2017-06-29 VITALS — BP 107/72 | HR 79 | Temp 98.0°F | Ht 59.0 in | Wt 241.0 lb

## 2017-06-29 DIAGNOSIS — Z1331 Encounter for screening for depression: Secondary | ICD-10-CM

## 2017-06-29 DIAGNOSIS — I10 Essential (primary) hypertension: Secondary | ICD-10-CM | POA: Diagnosis not present

## 2017-06-29 DIAGNOSIS — R0602 Shortness of breath: Secondary | ICD-10-CM | POA: Diagnosis not present

## 2017-06-29 DIAGNOSIS — R5383 Other fatigue: Secondary | ICD-10-CM

## 2017-06-29 DIAGNOSIS — Z0289 Encounter for other administrative examinations: Secondary | ICD-10-CM

## 2017-06-29 DIAGNOSIS — Z6841 Body Mass Index (BMI) 40.0 and over, adult: Secondary | ICD-10-CM

## 2017-06-29 DIAGNOSIS — E669 Obesity, unspecified: Secondary | ICD-10-CM

## 2017-06-29 DIAGNOSIS — IMO0001 Reserved for inherently not codable concepts without codable children: Secondary | ICD-10-CM

## 2017-06-29 DIAGNOSIS — Z1389 Encounter for screening for other disorder: Secondary | ICD-10-CM

## 2017-06-29 NOTE — Progress Notes (Signed)
Office: [phone]  /  Fax: [phone]   Dear Dr. [place],   Thank you for referring Meredith Byrd to our clinic. The following note includes my evaluation and treatment recommendations.  HPI:   Chief Complaint: OBESITY    Meredith Byrd has been referred by Meredith Byrd. MD for consultation regarding her obesity and obesity related comorbidities.    Meredith Byrd (MR# [id number]) is a 36 y.o. female who presents on 06/29/2017 for obesity evaluation and treatment. 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 Her heaviest preoperative weight was 270 lbs and she lost down to 169 lbs within 12 months or so. Meredith Byrd kept this weight off for about 18 months before starting to regain weight and has now regained 72 lbs from their lowest postoperative weight.     Meredith Byrd attended our information session and states she is currently in the action stage of change and ready to dedicate time achieving and maintaining a healthier weight. Meredith Byrd requests to join our Back-On-Track program to help manage their weight and relearn how to use their weight loss surgery to achieve improved health.    Meredith Byrd states her family eats meals together her desired weight loss is 70 lbs she has been heavy most of  her life she started gaining weight slowly all her life her heaviest weight ever was 280 lbs. she has significant food cravings issues  she is frequently drinking liquids with calories she frequently eats larger portions than normal  she struggles with emotional eating    Meredith Byrd feels her energy is lower than it should be. This has worsened with weight gain and has not worsened recently. Meredith Byrd admits to daytime somnolence and  denies waking up still tired. Patient is at risk for obstructive sleep apnea. Patent has a history of symptoms of daytime Meredith and morning headache. Patient generally gets 6 hours of sleep per  night, and states they generally have generally restful sleep. Snoring is present. Apneic episodes are present. Epworth Sleepiness Score is 8  Dyspnea on exertion Aften notes increasing shortness of breath with exercising and seems to be worsening over time with weight gain. She notes getting out of breath sooner with activity than she used to. This has not gotten worse recently. [name] denies orthopnea.  Hypertension Meredith Byrd is a 36 y.o. female with hypertension.  Meredith Byrd denies chest pain or headache today. She is working weight loss to help control her blood pressure with the goal of decreasing her risk of heart attack and stroke. Rahsheedas blood pressure is well controlled today on medications.  Depression Screen Meredith Byrd's Food and Mood (modified PHQ-9) score was  Depression screen PHQ 2/9 06/29/2017  Decreased Interest 1  Down, Depressed, Hopeless 1  PHQ - 2 Score 2  Altered sleeping 1  Tired, decreased energy 2  Change in appetite 1  Feeling bad or failure about yourself  0  Trouble concentrating 0  Moving slowly or fidgety/restless 0  Suicidal thoughts 0  PHQ-9 Score 6    ALLERGIES: Allergies  Allergen Reactions  . Fioricet [Butalbital-Apap-Caffeine] Other (See Comments)    GI upset  . Inderal [Propranolol] Nausea Only    Dropped BP too low    MEDICATIONS: Current Outpatient Prescriptions on File Prior to Visit  Medication Sig Dispense Refill  . gabapentin (NEURONTIN) 300 MG capsule Take 1 capsule (300 mg total) by mouth at bedtime. 90 capsule 0  . omeprazole (PRILOSEC) 40  MG capsule Take 40 mg by mouth daily.    . ranitidine (ZANTAC) 300 MG tablet Take 1 tablet (300 mg total) by mouth at bedtime. 30 tablet 2  . tiZANidine (ZANAFLEX) 4 MG tablet Take 1 tablet (4 mg total) by mouth 2 (two) times daily. Prn for muscle spasms 60 tablet 1  . traMADol (ULTRAM) 50 MG tablet As needed for migraine, do not refill in less than 30 days 30  tablet 1  . traZODone (DESYREL) 100 MG tablet Take 1 tablet (100 mg total) by mouth at bedtime. 90 tablet 1   No current facility-administered medications on file prior to visit.     PAST MEDICAL HISTORY: Past Medical History:  Diagnosis Date  . Allergy   . Anemia   . Anxiety   . Arthritis pain 08/20/2014  . Back pain   . GERD (gastroesophageal reflux disease)   . History of stress test 02/2011 ([facility])   there was no evidence of ischemia, but she only went 3 1/2 minutes on the treadmill making it very difficult to get a good accurate assessment, however  . Hx of echocardiogram    The echocadiogram was essentially normal with the exception of mild mitral calcification and borderline concentric LVH, which in the setting of her hypertension at this early age is something that mean her blood pressure is well controlled.   . Hypertension   . Iron deficiency anemia, unspecified 07/09/2014  . Migraines   . Palpitations   . Swelling   . Vitamin B12 deficiency 03/17/2016    PAST SURGICAL HISTORY: Past Surgical History:  Procedure Laterality Date  . BREAST SURGERY    . CHOLECYSTECTOMY    . GASTRIC BYPASS  2008  . OOPHORECTOMY Right 2010    SOCIAL HISTORY: Social History  Substance Use Topics  . Smoking status: Never Smoker  . Smokeless tobacco: Never Used  . Alcohol use No    FAMILY HISTORY: Family History  Problem Relation Age of Onset  . Stroke Maternal Grandmother   . Heart disease Maternal Grandmother   . Hypertension Maternal Grandmother   . Hyperlipidemia Maternal Grandmother   . Asthma Mother   . Heart disease Mother   . Diabetes Mother   . Early death Mother   . Hypertension Mother   . Thyroid disease Mother   . Anxiety disorder Mother   . Hypertension Father   . Heart disease Father     ROS: Review of Systems  HENT:       Difficult or Painful Swallowing  Eyes:       Wear Glasses or Contacts  Respiratory: Positive for cough.   Cardiovascular: Negative for  chest pain and orthopnea.  Musculoskeletal: Positive for back pain and neck pain.       Neck Lumps Swollen Glands (Neck) Muscle or Joint Pain  Neurological: Positive for headaches (migraines).  Endo/Heme/Allergies: Bruises/bleeds easily (easy bruising).    PHYSICAL EXAM: Blood pressure 107/72, pulse 79, temperature 98 F (36.7 C), height 4\' 11"  (1.499 m), weight 241 lb (109.3 kg), SpO2 100 %. Body mass index is 48.68 kg/m. Physical Exam  Constitutional: She is oriented to person, place, and time. She appears well-developed and well-nourished.  Cardiovascular: Normal rate.   Pulmonary/Chest: Effort normal.  Musculoskeletal: Normal range of motion.  Neurological: She is oriented to person, place, and time.  Skin: Skin is warm and dry.  Psychiatric: She has a normal mood and affect. Her behavior is normal.  Vitals reviewed.   RECENT LABS  AND TESTS: BMET    Component Value Date/Time   NA 138 01/19/2017 0857   K 4.0 01/19/2017 0857   CL 103 01/19/2017 0857   CO2 28 01/19/2017 0857   GLUCOSE 83 01/19/2017 0857   BUN 8 01/19/2017 0857   CREATININE 0.66 01/19/2017 0857   CREATININE 0.65 03/22/2014 1217   CALCIUM 8.9 01/19/2017 0857   GFRNONAA >90 06/07/2014 0359   GFRNONAA >89 03/22/2014 1217   GFRAA >90 06/07/2014 0359   GFRAA >89 03/22/2014 1217   No results found for: HGBA1C No results found for: INSULIN CBC    Component Value Date/Time   WBC 6.0 01/19/2017 0857   RBC 4.58 01/19/2017 0857   HGB 11.0 (L) 01/19/2017 0857   HGB 10.9 (L) 03/17/2016 0836   HCT 35.3 (L) 01/19/2017 0857   HCT 35.1 03/17/2016 0836   PLT 440.0 (H) 01/19/2017 0857   PLT 366 03/17/2016 0836   MCV 77.1 (L) 01/19/2017 0857   MCV 77.1 (L) 03/17/2016 0836   MCH 24.0 (L) 03/17/2016 0836   MCH 20.1 (L) 06/08/2014 0500   MCHC 31.1 01/19/2017 0857   RDW 16.0 (H) 01/19/2017 0857   RDW 15.5 (H) 03/17/2016 0836   LYMPHSABS 1.4 01/19/2017 0857   LYMPHSABS 1.2 03/17/2016 0836   MONOABS 0.2  01/19/2017 0857   MONOABS 0.3 03/17/2016 0836   EOSABS 0.1 01/19/2017 0857   EOSABS 0.1 03/17/2016 0836   BASOSABS 0.0 01/19/2017 0857   BASOSABS 0.0 03/17/2016 0836   Iron/TIBC/Ferritin/ %Sat    Component Value Date/Time   IRON 106 11/08/2014 0823   TIBC 289 11/08/2014 0823   FERRITIN 73.9 01/19/2017 0857   FERRITIN 113 03/17/2016 0836   IRONPCTSAT 37 11/08/2014 0823   IRONPCTSAT 2 (L) 03/22/2014 1217   Lipid Panel     Component Value Date/Time   CHOL 133 01/19/2017 0857   TRIG 67.0 01/19/2017 0857   HDL 55.40 01/19/2017 0857   CHOLHDL 2 01/19/2017 0857   VLDL 13.4 01/19/2017 0857   LDLCALC 65 01/19/2017 0857   Hepatic Function Panel     Component Value Date/Time   PROT 6.6 06/07/2014 0359   ALBUMIN 3.3 (L) 06/07/2014 0359   AST 13 06/07/2014 0359   ALT 11 06/07/2014 0359   ALKPHOS 56 06/07/2014 0359   BILITOT 0.3 06/07/2014 0359      Component Value Date/Time   TSH 0.78 01/19/2017 0857   TSH 0.828 06/06/2014 1145    ECG  shows NSR with a rate of 81 BPM INDIRECT CALORIMETER done today shows a VO2 of 161 and a REE of 1121. Her calculated basal metabolic rate is 4098 thus her basal metabolic rate is worse than expected.    ASSESSMENT AND PLAN: Other Meredith - Plan: EKG 12-Lead, Vitamin B12, CBC With Differential, Comprehensive metabolic panel, Folate, Hemoglobin A1c, Insulin, random, VITAMIN D 25 Hydroxy (Vit-D Deficiency, Fractures), T3, T4, free, TSH, Lipid Panel With LDL/HDL Ratio  Shortness of breath on exertion  Essential hypertension  Depression screening  Class 3 obesity with serious comorbidity and body mass index (BMI) of 45.0 to 49.9 in adult, unspecified obesity type [facility])  PLAN: Meredith Meredith Byrd was informed that her Meredith may be related to obesity, depression or many other causes. Labs will be ordered, and in the meanwhile Kali has agreed to work on diet, exercise and weight loss to help with Meredith. Proper sleep hygiene was discussed  including the need for 7-8 hours of quality sleep each night. A sleep study was not  ordered based on symptoms and Epworth score.  Dyspnea on exertion Meredith Byrd's shortness of breath appears to be obesity related and exercise induced. She has agreed to work on weight loss and gradually increase exercise to treat her exercise induced shortness of breath. If Meredith Byrd follows our instructions and loses weight without improvement of her shortness of breath, we will plan to refer to pulmonology. We will monitor this condition regularly. Meredith Byrd agrees to this plan.  Hypertension We discussed sodium restriction, working on healthy weight loss, and a regular exercise program as the means to achieve improved blood pressure control. Meredith Byrd agreed with this plan and agreed to follow up as directed. We will continue to monitor her blood pressure as well as her progress with the above lifestyle modifications. We will check labs and She will continue her medications as prescribed and will watch for signs of hypotension as she continues her lifestyle modifications.  Depression Screen Meredith Byrd had a mildly positive depression screening. Depression is commonly associated with obesity and often results in emotional eating behaviors. We will monitor this closely and work on CBT to help improve the non-hunger eating patterns. Referral to Psychology may be required if no improvement is seen as she continues in our clinic.  Obesity Hadlee is currently in the action stage of change and her goal is to continue with weight loss efforts. I recommend Jase begin the structured treatment plan as follows:  She has agreed to keep a food journal with 900 to 1100 calories and 70+ grams of protein daily Jalecia has been instructed to eventually work up to a goal of 150 minutes of combined cardio and strengthening exercise per week for weight loss and overall health benefits. We discussed the following Behavioral  Modification Strategies today: keep a strict food journal, keeping healthy foods in the home,  increasing lean protein intake, decreasing simple carbohydrates and work on meal planning and easy cooking plans  Camie has agreed to join our [employer] and follow up with our clinic in 1 week. She was informed of the importance of frequent follow up visits to maximize her success with intensive lifestyle modifications for her multiple health conditions. She was informed we would discuss her lab results at her next visit unless there is a critical issue that needs to be addressed sooner. Myriam agreed to keep her next visit at the agreed upon time to discuss these results.  I, Nevada Crane, am acting as transcriptionist for Quillian Quince, MD  I have reviewed the above documentation for accuracy and completeness, and I agree with the above. -Quillian Quince, MD    OBESITY BEHAVIORAL INTERVENTION VISIT  Today's visit was # 1 out of 22.  Starting weight: 241 lbs Starting date: 06/29/17 Today's weight : 241 lbs  Today's date: 06/29/2017 Total lbs lost to date: 0 (Patients must lose 7 lbs in the first 6 months to continue with counseling)   ASK: We discussed the diagnosis of obesity with Chad Cordial today and Kayte agreed to give [place] permission to discuss obesity behavioral modification therapy today.  ASSESS: Contrina has the diagnosis of obesity and her BMI today is 48.65 Veatrice is in the action stage of change   ADVISE: Dereka was educated on the multiple health risks of obesity as well as the benefit of weight loss to improve her health. She was advised of the need for long term treatment and the importance of lifestyle modifications.  AGREE: Multiple dietary modification options and treatment options were discussed and  Lavanda agreed to keep a food journal with 900 to 1100 calories and 70+ grams of protein daily We discussed the following Behavioral  Modification Strategies today: keep a strict food journal, keeping healthy foods in the home, increasing lean protein intake, decreasing simple carbohydrates and work on meal planning and easy cooking plans

## 2017-06-30 DIAGNOSIS — Z0289 Encounter for other administrative examinations: Secondary | ICD-10-CM

## 2017-06-30 LAB — COMPREHENSIVE METABOLIC PANEL
A/G RATIO: 1.3 (ref 1.2–2.2)
ALK PHOS: 72 IU/L (ref 39–117)
ALT: 32 IU/L (ref 0–32)
AST: 21 IU/L (ref 0–40)
Albumin: 3.8 g/dL (ref 3.5–5.5)
BILIRUBIN TOTAL: 0.4 mg/dL (ref 0.0–1.2)
BUN/Creatinine Ratio: 9 (ref 9–23)
BUN: 7 mg/dL (ref 6–20)
CHLORIDE: 100 mmol/L (ref 96–106)
CO2: 23 mmol/L (ref 20–29)
Calcium: 9.3 mg/dL (ref 8.7–10.2)
Creatinine, Ser: 0.74 mg/dL (ref 0.57–1.00)
GFR calc Af Amer: 120 mL/min/{1.73_m2} (ref >59)
GFR calc non Af Amer: 104 mL/min/{1.73_m2} (ref >59)
GLUCOSE: 85 mg/dL (ref 65–99)
Globulin, Total: 2.9 g/dL (ref 1.5–4.5)
Potassium: 4.6 mmol/L (ref 3.5–5.2)
Sodium: 136 mmol/L (ref 134–144)
Total Protein: 6.7 g/dL (ref 6.0–8.5)

## 2017-06-30 LAB — CBC WITH DIFFERENTIAL
BASOS ABS: 0 10*3/uL (ref 0.0–0.2)
Basos: 0 %
EOS (ABSOLUTE): 0.1 10*3/uL (ref 0.0–0.4)
Eos: 1 %
Hematocrit: 35.5 % (ref 34.0–46.6)
Hemoglobin: 10.8 g/dL — ABNORMAL LOW (ref 11.1–15.9)
IMMATURE GRANS (ABS): 0 10*3/uL (ref 0.0–0.1)
Immature Granulocytes: 0 %
LYMPHS ABS: 1.6 10*3/uL (ref 0.7–3.1)
LYMPHS: 27 %
MCH: 22.7 pg — AB (ref 26.6–33.0)
MCHC: 30.4 g/dL — AB (ref 31.5–35.7)
MCV: 75 fL — AB (ref 79–97)
MONOS ABS: 0.2 10*3/uL (ref 0.1–0.9)
Monocytes: 4 %
NEUTROS PCT: 68 %
Neutrophils Absolute: 4.2 10*3/uL (ref 1.4–7.0)
RBC: 4.75 x10E6/uL (ref 3.77–5.28)
RDW: 16.4 % — AB (ref 12.3–15.4)
WBC: 6.1 10*3/uL (ref 3.4–10.8)

## 2017-06-30 LAB — LIPID PANEL WITH LDL/HDL RATIO
Cholesterol, Total: 109 mg/dL (ref 100–199)
HDL: 46 mg/dL (ref >39)
LDL Calculated: 47 mg/dL (ref 0–99)
LDL/HDL RATIO: 1 ratio (ref 0.0–3.2)
Triglycerides: 81 mg/dL (ref 0–149)
VLDL CHOLESTEROL CAL: 16 mg/dL (ref 5–40)

## 2017-06-30 LAB — FOLATE: FOLATE: 5.8 ng/mL (ref >3.0)

## 2017-06-30 LAB — HEMOGLOBIN A1C
ESTIMATED AVERAGE GLUCOSE: 91 mg/dL
Hgb A1c MFr Bld: 4.8 % (ref 4.8–5.6)

## 2017-06-30 LAB — VITAMIN D 25 HYDROXY (VIT D DEFICIENCY, FRACTURES): Vit D, 25-Hydroxy: 16.7 ng/mL — ABNORMAL LOW (ref 30.0–100.0)

## 2017-06-30 LAB — T3: T3, Total: 158 ng/dL (ref 71–180)

## 2017-06-30 LAB — T4, FREE: FREE T4: 1.24 ng/dL (ref 0.82–1.77)

## 2017-06-30 LAB — VITAMIN B12: Vitamin B-12: 309 pg/mL (ref 232–1245)

## 2017-06-30 LAB — TSH: TSH: 0.708 u[IU]/mL (ref 0.450–4.500)

## 2017-06-30 LAB — INSULIN, RANDOM: INSULIN: 12.1 u[IU]/mL (ref 2.6–24.9)

## 2017-07-01 ENCOUNTER — Encounter: Payer: Self-pay | Admitting: Family Medicine

## 2017-07-06 ENCOUNTER — Ambulatory Visit (INDEPENDENT_AMBULATORY_CARE_PROVIDER_SITE_OTHER): Payer: 59 | Admitting: Family Medicine

## 2017-07-06 VITALS — BP 106/73 | HR 76 | Temp 98.1°F | Ht 59.0 in | Wt 239.0 lb

## 2017-07-06 DIAGNOSIS — Z9889 Other specified postprocedural states: Secondary | ICD-10-CM

## 2017-07-06 DIAGNOSIS — Z6841 Body Mass Index (BMI) 40.0 and over, adult: Secondary | ICD-10-CM | POA: Diagnosis not present

## 2017-07-06 DIAGNOSIS — D508 Other iron deficiency anemias: Secondary | ICD-10-CM | POA: Diagnosis not present

## 2017-07-06 DIAGNOSIS — E8881 Metabolic syndrome: Secondary | ICD-10-CM

## 2017-07-06 DIAGNOSIS — Z9189 Other specified personal risk factors, not elsewhere classified: Secondary | ICD-10-CM

## 2017-07-06 DIAGNOSIS — E669 Obesity, unspecified: Secondary | ICD-10-CM | POA: Diagnosis not present

## 2017-07-06 DIAGNOSIS — IMO0001 Reserved for inherently not codable concepts without codable children: Secondary | ICD-10-CM

## 2017-07-06 DIAGNOSIS — E559 Vitamin D deficiency, unspecified: Secondary | ICD-10-CM | POA: Diagnosis not present

## 2017-07-06 MED ORDER — DOCUSATE SODIUM 100 MG PO CAPS: 100.0000 mg | ORAL_CAPSULE | Freq: Two times a day (BID) | ORAL | 1 refills | Status: DC

## 2017-07-06 MED ORDER — VITAMIN D (ERGOCALCIFEROL) 1.25 MG (50000 UNIT) PO CAPS: 50000.0000 [IU] | ORAL_CAPSULE | ORAL | 0 refills | Status: DC

## 2017-07-06 MED ORDER — FERROUS SULFATE 325 (65 FE) MG PO TABS: 325.0000 mg | ORAL_TABLET | Freq: Two times a day (BID) | ORAL | 1 refills | Status: DC

## 2017-07-06 NOTE — Progress Notes (Signed)
Office: [phone]  /  Fax: [phone]   HPI:   Chief Complaint: OBESITY Kera is here to discuss her progress with her obesity treatment plan. She is on the keep a food journal with 900 to 1100 calories and 70+ grams of protein daily and is following her eating plan approximately 60 % of the time. She states she is exercising 0 minutes 0 times per week. Jaryn  Is status post weight loss surgery RNY Citlaly tried to journal but she struggled to use her phone app and mostly controlled her portions and made smarter food choices. Her weight is 239 lb (108.4 kg) today and has had a weight loss of 2 pounds over a period of 1 week since her last visit. She has lost 2 lbs since starting treatment with [place].  Vitamin D deficiency Danaja has a diagnosis of vitamin D deficiency. She is currently on a multi vitamin and level is still low. Melisha admits fatigue and denies nausea, vomiting or muscle weakness.  Iron Deficiency Anemia Gelsey has chronic anemia on multi vitamin. She notes fatigue, does not appear to be due to menorrhagia. She is status post weight loss surgery so absorption may be a problem.   Insulin Resistance Allegra has a new diagnosis of insulin resistance based on her mildly elevated fasting insulin level >5. Although Raschelle's blood glucose readings and Hgb A1c are normal, insulin resistance puts her at greater risk of metabolic syndrome and diabetes. She is not taking metformin currently and continues to work on diet and exercise to decrease risk of diabetes.  At risk for diabetes Demecia is at higher than average risk for developing diabetes due to her obesity and insulin resistance. She currently denies polyuria or polydipsia.   ALLERGIES: Allergies  Allergen Reactions   Fioricet [Butalbital-Apap-Caffeine] Other (See Comments)    GI upset   Inderal [Propranolol] Nausea Only    Dropped BP too low    MEDICATIONS: Current Outpatient  Prescriptions on File Prior to Visit  Medication Sig Dispense Refill   gabapentin (NEURONTIN) 300 MG capsule Take 1 capsule (300 mg total) by mouth at bedtime. 90 capsule 0   omeprazole (PRILOSEC) 40 MG capsule Take 40 mg by mouth daily.     ranitidine (ZANTAC) 300 MG tablet Take 1 tablet (300 mg total) by mouth at bedtime. 30 tablet 2   tiZANidine (ZANAFLEX) 4 MG tablet Take 1 tablet (4 mg total) by mouth 2 (two) times daily. Prn for muscle spasms 60 tablet 1   traMADol (ULTRAM) 50 MG tablet As needed for migraine, do not refill in less than 30 days 30 tablet 1   traZODone (DESYREL) 100 MG tablet Take 1 tablet (100 mg total) by mouth at bedtime. 90 tablet 1   No current facility-administered medications on file prior to visit.     PAST MEDICAL HISTORY: Past Medical History:  Diagnosis Date   Allergy    Anemia    Anxiety    Arthritis pain 08/20/2014   Back pain    GERD (gastroesophageal reflux disease)    History of stress test 02/2011 ([facility])   there was no evidence of ischemia, but she only went 3 1/2 minutes on the treadmill making it very difficult to get a good accurate assessment, however   Hx of echocardiogram    The echocadiogram was essentially normal with the exception of mild mitral calcification and borderline concentric LVH, which in the setting of her hypertension at this early age is something that mean her  blood pressure is well controlled.    Hypertension    Iron deficiency anemia, unspecified 07/09/2014   Migraines    Palpitations    Swelling    Vitamin B12 deficiency 03/17/2016    PAST SURGICAL HISTORY: Past Surgical History:  Procedure Laterality Date   BREAST SURGERY     CHOLECYSTECTOMY     GASTRIC BYPASS  2008   OOPHORECTOMY Right 2010    SOCIAL HISTORY: Social History  Substance Use Topics   Smoking status: Never Smoker   Smokeless tobacco: Never Used   Alcohol use No    FAMILY HISTORY: Family History  Problem Relation Age  of Onset   Stroke Maternal Grandmother    Heart disease Maternal Grandmother    Hypertension Maternal Grandmother    Hyperlipidemia Maternal Grandmother    Asthma Mother    Heart disease Mother    Diabetes Mother    Early death Mother    Hypertension Mother    Thyroid disease Mother    Anxiety disorder Mother    Hypertension Father    Heart disease Father     ROS: Review of Systems  Constitutional: Positive for malaise/fatigue and weight loss.  Gastrointestinal: Negative for nausea and vomiting.  Genitourinary: Negative for frequency.  Musculoskeletal:       Negative muscle weakness  Endo/Heme/Allergies: Negative for polydipsia.    PHYSICAL EXAM: Blood pressure 106/73, pulse 76, temperature 98.1 F (36.7 C), temperature source Oral, height  (1.499 m), weight 239 lb (108.4 kg), last menstrual period 06/05/2017, SpO2 100 %. Body mass index is 48.27 kg/m. Physical Exam  Constitutional: She is oriented to person, place, and time. She appears well-developed and well-nourished.  Cardiovascular: Normal rate.   Pulmonary/Chest: Effort normal.  Musculoskeletal: Normal range of motion.  Neurological: She is oriented to person, place, and time.  Skin: Skin is warm and dry.  Psychiatric: She has a normal mood and affect. Her behavior is normal.  Vitals reviewed.   RECENT LABS AND TESTS: BMET    Component Value Date/Time   NA 136 06/29/2017 0917   K 4.6 06/29/2017 0917   CL 100 06/29/2017 0917   CO2 23 06/29/2017 0917   GLUCOSE 85 06/29/2017 0917   GLUCOSE 83 01/19/2017 0857   BUN 7 06/29/2017 0917   CREATININE 0.74 06/29/2017 0917   CREATININE 0.65 03/22/2014 1217   CALCIUM 9.3 06/29/2017 0917   GFRNONAA 104 06/29/2017 0917   GFRNONAA >89 03/22/2014 1217   GFRAA 120 06/29/2017 0917   GFRAA >89 03/22/2014 1217   Lab Results  Component Value Date   HGBA1C 4.8 06/29/2017   Lab Results  Component Value Date   INSULIN 12.1 06/29/2017   CBC      Component Value Date/Time   WBC 6.1 06/29/2017 0917   WBC 6.0 01/19/2017 0857   RBC 4.75 06/29/2017 0917   RBC 4.58 01/19/2017 0857   HGB 10.8 (L) 06/29/2017 0917   HGB 10.9 (L) 03/17/2016 0836   HCT 35.5 06/29/2017 0917   HCT 35.1 03/17/2016 0836   PLT 440.0 (H) 01/19/2017 0857   PLT 366 03/17/2016 0836   MCV 75 (L) 06/29/2017 0917   MCV 77.1 (L) 03/17/2016 0836   MCH 22.7 (L) 06/29/2017 0917   MCH 24.0 (L) 03/17/2016 0836   MCH 20.1 (L) 06/08/2014 0500   MCHC 30.4 (L) 06/29/2017 0917   MCHC 31.1 01/19/2017 0857   RDW 16.4 (H) 06/29/2017 0917   RDW 15.5 (H) 03/17/2016 0836   LYMPHSABS 1.6 06/29/2017  0917   LYMPHSABS 1.2 03/17/2016 0836   MONOABS 0.2 01/19/2017 0857   MONOABS 0.3 03/17/2016 0836   EOSABS 0.1 06/29/2017 0917   BASOSABS 0.0 06/29/2017 0917   BASOSABS 0.0 03/17/2016 0836   Iron/TIBC/Ferritin/ %Sat    Component Value Date/Time   IRON 106 11/08/2014 0823   TIBC 289 11/08/2014 0823   FERRITIN 73.9 01/19/2017 0857   FERRITIN 113 03/17/2016 0836   IRONPCTSAT 37 11/08/2014 0823   IRONPCTSAT 2 (L) 03/22/2014 1217   Lipid Panel     Component Value Date/Time   CHOL 109 06/29/2017 0917   TRIG 81 06/29/2017 0917   HDL 46 06/29/2017 0917   CHOLHDL 2 01/19/2017 0857   VLDL 13.4 01/19/2017 0857   LDLCALC 47 06/29/2017 0917   Hepatic Function Panel     Component Value Date/Time   PROT 6.7 06/29/2017 0917   ALBUMIN 3.8 06/29/2017 0917   AST 21 06/29/2017 0917   ALT 32 06/29/2017 0917   ALKPHOS 72 06/29/2017 0917   BILITOT 0.4 06/29/2017 0917      Component Value Date/Time   TSH 0.708 06/29/2017 0917   TSH 0.78 01/19/2017 0857   TSH 0.828 06/06/2014 1145    ASSESSMENT AND PLAN: Vitamin D deficiency - Plan: Vitamin D, Ergocalciferol, (DRISDOL) 50000 units CAPS capsule  Other iron deficiency anemia - Plan: ferrous sulfate (FEOSOL) 325 (65 FE) MG tablet, docusate sodium (COLACE) 100 MG capsule  Insulin resistance  At risk for diabetes  mellitus  S/P gastric surgery  Class 3 obesity with serious comorbidity and body mass index (BMI) of 45.0 to 49.9 in adult, unspecified obesity type (HCC)  PLAN:  Vitamin D Deficiency Lynzy was informed that low vitamin D levels contributes to fatigue and are associated with obesity, breast, and colon cancer. She agrees to start to take prescription Vit D ,000 IU every week #4 with 1 refill and will follow up for routine testing of vitamin D, at least 2-3 times per year. She was informed of the risk of over-replacement of vitamin D and agrees to not increase her dose unless he discusses this with [place] first.  Iron Deficiency Anemia The diagnosis of Iron deficiency anemia was discussed with Paysen and was explained in detail. She was given suggestions of iron rich foods and she agrees to start FeSO4 325 mg bid #60 with 1 refill and start colace 100 mg bid #60 with 1 refill and will follow up as needed.   Insulin Resistance Cniyah will continue to work on weight loss, exercise, and decreasing simple carbohydrates in her diet to help decrease the risk of diabetes. She was informed that eating too many simple carbohydrates or too many calories at one sitting increases the likelihood of GI side effects. Delia agreed to follow up with [place] as directed to monitor her progress.  Diabetes risk counseling Roshanda was given extended (15 minutes) diabetes prevention counseling today. She is 36 y.o. female and has risk factors for diabetes including obesity and insulin resistance. We discussed intensive lifestyle modifications today with an emphasis on weight loss as well as increasing exercise and decreasing simple carbohydrates in her diet.  Obesity Nupur is currently in the action stage of change. As such, her goal is to continue with weight loss efforts She has agreed to keep a food journal with 900 to 1100 calories and 70+ grams of protein daily Guy has been instructed to  work up to a goal of 150 minutes of combined cardio and strengthening exercise per  week for weight loss and overall health benefits. We discussed the following Behavioral Modification Strategies today: keep a strict food journal, increasing lean protein intake and decreasing simple carbohydrates   Shirely was strongly encouraged to start journaling regularly. Significant time was spent educating her on how to do this properly. Jahmia is to start Back On Track classes.  Charmon has agreed to follow up with our clinic as needed. She was informed of the importance of frequent follow up visits to maximize her success with intensive lifestyle modifications for her multiple health conditions.  I, Nevada Crane, am acting as transcriptionist for Quillian Quince, MD  I have reviewed the above documentation for accuracy and completeness, and I agree with the above. -Quillian Quince, MD   OBESITY BEHAVIORAL INTERVENTION VISIT  Today's visit was # 2 out of 22.  Starting weight: 241 lbs Starting date: 06/29/17 Today's weight : 239 lbs  Today's date: 07/06/2017 Total lbs lost to date: 2 (Patients must lose 7 lbs in the first 6 months to continue with counseling)   ASK: We discussed the diagnosis of obesity with Chad Cordial today and Ally agreed to give [place] permission to discuss obesity behavioral modification therapy today.  ASSESS: Maelie has the diagnosis of obesity and her BMI today is 48.25 Amarise is in the action stage of change   ADVISE: Kristeena was educated on the multiple health risks of obesity as well as the benefit of weight loss to improve her health. She was advised of the need for long term treatment and the importance of lifestyle modifications.  AGREE: Multiple dietary modification options and treatment options were discussed and  Ravyn agreed to keep a food journal with 900 to 1100 calories and 70+ grams of protein daily We discussed the following  Behavioral Modification Strategies today: keep a strict food journal, increasing lean protein intake and decreasing simple carbohydrates

## 2017-07-08 ENCOUNTER — Encounter: Payer: Self-pay | Admitting: Neurology

## 2017-07-08 ENCOUNTER — Ambulatory Visit (INDEPENDENT_AMBULATORY_CARE_PROVIDER_SITE_OTHER): Payer: 59 | Admitting: Neurology

## 2017-07-08 VITALS — BP 140/80 | HR 80 | Ht 59.0 in | Wt 244.0 lb

## 2017-07-08 DIAGNOSIS — G478 Other sleep disorders: Secondary | ICD-10-CM | POA: Diagnosis not present

## 2017-07-08 DIAGNOSIS — G2581 Restless legs syndrome: Secondary | ICD-10-CM

## 2017-07-08 DIAGNOSIS — G4719 Other hypersomnia: Secondary | ICD-10-CM

## 2017-07-08 DIAGNOSIS — Z6841 Body Mass Index (BMI) 40.0 and over, adult: Secondary | ICD-10-CM

## 2017-07-08 DIAGNOSIS — R51 Headache: Secondary | ICD-10-CM

## 2017-07-08 DIAGNOSIS — R0683 Snoring: Secondary | ICD-10-CM | POA: Diagnosis not present

## 2017-07-08 DIAGNOSIS — R519 Headache, unspecified: Secondary | ICD-10-CM

## 2017-07-08 DIAGNOSIS — Z9884 Bariatric surgery status: Secondary | ICD-10-CM | POA: Diagnosis not present

## 2017-07-08 DIAGNOSIS — R351 Nocturia: Secondary | ICD-10-CM | POA: Diagnosis not present

## 2017-07-08 NOTE — Patient Instructions (Addendum)
Thank you for choosing [facility] for your sleep related care! It was nice to meet you today!  Here is what we discussed today and what we came up with as our plan for you:    Based on your symptoms and your exam I believe you are at risk for obstructive sleep apnea or OSA, and I think we should proceed with a sleep study to determine whether you do or do not have OSA and how severe it is. If you have more than mild OSA, I want you to consider treatment with CPAP. Please remember, the risks and ramifications of moderate to severe obstructive sleep apnea or OSA are: Cardiovascular disease, including congestive heart failure, stroke, difficult to control hypertension, arrhythmias, and even type 2 diabetes has been linked to untreated OSA. Sleep apnea causes disruption of sleep and sleep deprivation in most cases, which, in turn, can cause recurrent headaches, problems with memory, mood, concentration, focus, and vigilance. Most people with untreated sleep apnea report excessive daytime sleepiness, which can affect their ability to drive. Please do not drive if you feel sleepy.   I will likely see you back after your sleep study to go over the test results and where to go from there. We will call you after your sleep study to advise about the results (most likely, you will hear from Kristen, my nurse) and to set up an appointment at the time, as necessary.    Our sleep lab administrative assistant, Dawn will call you to schedule your sleep study. If you don't hear back from her by about 2 weeks from now, please feel free to call her at [phone]. You can leave a message with your phone number and concerns, if you get the voicemail box. She will call back as soon as possible.   
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
## 2017-07-09 ENCOUNTER — Other Ambulatory Visit: Payer: Self-pay

## 2017-07-09 DIAGNOSIS — K219 Gastro-esophageal reflux disease without esophagitis: Secondary | ICD-10-CM

## 2017-07-09 MED ORDER — RANITIDINE HCL 300 MG PO TABS: 300.0000 mg | ORAL_TABLET | Freq: Every day | ORAL | 1 refills | Status: DC

## 2017-07-12 ENCOUNTER — Other Ambulatory Visit: Payer: Self-pay | Admitting: Nurse Practitioner

## 2017-07-12 NOTE — Telephone Encounter (Signed)
Received fax confirmation [facility] [phone]. sy

## 2017-07-13 ENCOUNTER — Ambulatory Visit (INDEPENDENT_AMBULATORY_CARE_PROVIDER_SITE_OTHER): Payer: 59 | Admitting: Dietician

## 2017-07-13 VITALS — Ht 59.0 in | Wt 239.0 lb

## 2017-07-13 DIAGNOSIS — Z9189 Other specified personal risk factors, not elsewhere classified: Secondary | ICD-10-CM

## 2017-07-13 DIAGNOSIS — Z9884 Bariatric surgery status: Secondary | ICD-10-CM | POA: Diagnosis not present

## 2017-07-13 DIAGNOSIS — Z6841 Body Mass Index (BMI) 40.0 and over, adult: Secondary | ICD-10-CM

## 2017-07-14 ENCOUNTER — Other Ambulatory Visit: Payer: Self-pay | Admitting: Family Medicine

## 2017-07-19 NOTE — Progress Notes (Signed)
  Office: [phone]  /  Fax: [phone]  OBESITY AND PREVENTATIVE COUNSELING BEHAVIORAL INTERVENTION VISIT  Today's one-on-one visit was # 3 out of 22, and group BOT# 1  Starting weight: 241 lbs Starting date: 06/29/17 Today's weight : Weight: 239 lb (108.4 kg)  Today's date: 07/13/17 Total lbs lost to date: 2 (retaining some fluid) (Patients must lose 7 lbs in the first 6 months to continue with counseling)  PREVENTATIVE COUNSELING: Meredith Byrd is at high risk of developing multiple serious health conditions including uncontrolled diabetes, coronary artery disease, heart failure, sleep apnea, chronic pain, depression, obesity related cancers and more due to her weight. These risks have been discussed in depth and Meredith Byrd has agreed to work on the underlying disease of obesity to decrease the risk of developing any and all of these obesity related disease.  Meredith Byrd received intensive counseling today on specific behavioral modifications to get her back on track s/p her lap sleeve gastrectomy. She is keeping a food journal of her daily intake and states her cravings are somewhat less since starting the Wellbutrin. She is following her meal plan approximately 90% of the time.    ASK: We discussed the diagnosis of obesity with Meredith Byrd today and Meredith Byrd agreed to give [place] permission to discuss obesity behavioral modification therapy today.  ASSESS: Meredith Byrd has the diagnosis of obesity and her BMI today is 48.25 Meredith Byrd is in the action stage of change   ADVISE: Meredith Byrd was educated on the multiple health risks of obesity as well as the benefit of weight loss to improve her health. She was advised of the need for long term treatment and the importance of lifestyle modifications.  AGREE: Multiple dietary modification options and treatment options were discussed and  Meredith Byrd agreed to keep a food journal with 900-100 calories and 70+ grams protein  We discussed the  following Behavioral Modification Stratagies today: increasing lean protein intake, decreasing simple carbohydrates , work on meal planning and easy cooking plans, emotional eating strategies, decrease snacking , ways to avoid boredom eating and ways to avoid night time snacking.  We spent > than 50% of the 15 minute visit on the counseling as documented in the note.

## 2017-07-20 ENCOUNTER — Ambulatory Visit (INDEPENDENT_AMBULATORY_CARE_PROVIDER_SITE_OTHER): Payer: 59 | Admitting: Dietician

## 2017-07-20 VITALS — Ht 59.0 in | Wt 239.0 lb

## 2017-07-20 DIAGNOSIS — E8881 Metabolic syndrome: Secondary | ICD-10-CM | POA: Diagnosis not present

## 2017-07-20 DIAGNOSIS — Z9189 Other specified personal risk factors, not elsewhere classified: Secondary | ICD-10-CM

## 2017-07-20 DIAGNOSIS — Z6841 Body Mass Index (BMI) 40.0 and over, adult: Secondary | ICD-10-CM | POA: Diagnosis not present

## 2017-07-20 DIAGNOSIS — Z9884 Bariatric surgery status: Secondary | ICD-10-CM

## 2017-07-21 NOTE — Progress Notes (Signed)
  Office: [phone]  /  Fax: [phone]  OBESITY AND PREVENTATIVE COUNSELING BEHAVIORAL INTERVENTION VISIT  Today's visit was # 4 out of 22, BOT# 2  Starting weight: 241 lbs Starting date: 06/29/17 Today's weight : Weight: 239 lb (108.4 kg)  Today's date:07/20/17 Total lbs lost to date: 2 lbs (retaining some fluid) (Patients must lose 7 lbs in the first 6 months to continue with counseling)  PREVENTATIVE COUNSELING: Aolani is at high risk of developing multiple serious health conditions including uncontrolled diabetes, coronary artery disease, heart failure, sleep apnea, chronic pain, depression, obesity related cancers and more due to her weight. These risks have been discussed in depth and Mazey has agreed to work on the underlying disease of obesity to decrease the risk of developing any and all of these obesity related disease.  Dea received intensive behavior modification counseling today on emotional eating. Detailed strategies for handling emotional eating was discussed and written informations was provided. Lerin s s/p roux-en-Y gastric bypass and is actively working on an obesity treatment plan. She reports she did much better at journaling this week and is doing well at meeting her calorie and protein goals.    ASK: We discussed the diagnosis of obesity with Chad Cordial today and Loucinda agreed to give [place] permission to discuss obesity behavioral modification therapy today.  ASSESS: Latrica has the diagnosis of obesity and her BMI today is 48.25 Fartun is in the action stage of change   ADVISE: Azeneth was educated on the multiple health risks of obesity as well as the benefit of weight loss to improve her health. She was advised of the need for long term treatment and the importance of lifestyle modifications.  AGREE: Multiple dietary modification options and treatment options were discussed and  Ema agreed to keep a food journal  with [phone] calories and 70+ grams protein  We discussed the following Behavioral Modification Stratagies today: increasing lean protein intake, decreasing simple carbohydrates , emotional eating strategies, ways to avoid boredom eating, ways to avoid night time snacking and avoiding temptations  We spent > than 50% of the 15 minute visit on the counseling as documented in the note.

## 2017-07-27 ENCOUNTER — Ambulatory Visit (INDEPENDENT_AMBULATORY_CARE_PROVIDER_SITE_OTHER): Payer: 59 | Admitting: Dietician

## 2017-07-27 VITALS — Ht 59.0 in | Wt 240.0 lb

## 2017-07-27 DIAGNOSIS — E8881 Metabolic syndrome: Secondary | ICD-10-CM | POA: Diagnosis not present

## 2017-07-27 DIAGNOSIS — Z6841 Body Mass Index (BMI) 40.0 and over, adult: Secondary | ICD-10-CM

## 2017-07-27 DIAGNOSIS — Z9884 Bariatric surgery status: Secondary | ICD-10-CM

## 2017-07-27 DIAGNOSIS — Z9189 Other specified personal risk factors, not elsewhere classified: Secondary | ICD-10-CM
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
## 2017-08-03 ENCOUNTER — Ambulatory Visit (INDEPENDENT_AMBULATORY_CARE_PROVIDER_SITE_OTHER): Payer: 59 | Admitting: Dietician

## 2017-08-03 VITALS — Ht 59.0 in | Wt 239.0 lb

## 2017-08-03 DIAGNOSIS — Z6841 Body Mass Index (BMI) 40.0 and over, adult: Secondary | ICD-10-CM | POA: Diagnosis not present

## 2017-08-03 DIAGNOSIS — Z9189 Other specified personal risk factors, not elsewhere classified: Secondary | ICD-10-CM | POA: Diagnosis not present

## 2017-08-03 DIAGNOSIS — E8881 Metabolic syndrome: Secondary | ICD-10-CM

## 2017-08-03 DIAGNOSIS — Z9884 Bariatric surgery status: Secondary | ICD-10-CM

## 2017-08-06 ENCOUNTER — Other Ambulatory Visit (INDEPENDENT_AMBULATORY_CARE_PROVIDER_SITE_OTHER): Payer: Self-pay | Admitting: Family Medicine

## 2017-08-06 DIAGNOSIS — E559 Vitamin D deficiency, unspecified: Secondary | ICD-10-CM

## 2017-08-09 ENCOUNTER — Telehealth (INDEPENDENT_AMBULATORY_CARE_PROVIDER_SITE_OTHER): Payer: Self-pay | Admitting: Family Medicine

## 2017-08-09 ENCOUNTER — Other Ambulatory Visit: Payer: Self-pay

## 2017-08-09 MED ORDER — TIZANIDINE HCL 4 MG PO TABS: 4.0000 mg | ORAL_TABLET | Freq: Two times a day (BID) | ORAL | 0 refills | Status: DC

## 2017-08-09 NOTE — Telephone Encounter (Signed)
Spoke with the patient and offered her a early visit this week to refill her Vit D or we can wait until her visit on next week. Patient choose to get her refill on her follow up visit next week. April, CMA

## 2017-08-09 NOTE — Progress Notes (Signed)
  Office: [phone]  /  Fax: [phone]  OBESITY AND PREVENTATIVE COUNSELING BEHAVIORAL INTERVENTION VISIT  Today's visit was # 6 out of 22, BOT# 4  Starting weight: 241 lbs Starting date: 06/29/17 Today's weight : Weight: 239 lb (108.4 kg)  Today's date:140/23/18 Total lbs lost to date:  2 lbs (Patients must lose 7 lbs in the first 6 months to continue with counseling)  PREVENTATIVE COUNSELING: Yurani is at high risk of developing multiple serious health conditions including uncontrolled diabetes, coronary artery disease, heart failure, sleep apnea, chronic pain, depression, obesity related cancers and more due to her weight. These risks have been discussed in depth and Ramyah has agreed to work on the underlying disease of obesity to decrease the risk of developing any and all of these obesity related disease.  Giabella received intensive nutrition counseling today on healthy eating during the holiday's, celebrations and vacations and dealing with getting off track. Detailed behavior modification strategies for staying motivated during holiday's/celebrations/vacations was discussed. Laura-Lee is journaling her food intake 100% of the time and is actively working her obesity treatment plan.  ASK: We discussed the diagnosis of obesity with Chad Cordialahsheeda M Stansell today and Shanira agreed to give us permission to discuss obesity behavioral modification therapy today.  ASSESS: Vernell MorgansRahsheeda has the diagnosis of obesity and her BMI today is 48.25 Octavia is in the action stage of change   ADVISE: Jenell was educated on the multiple health risks of obesity as well as the benefit of weight loss to improve her health. She was advised of the need for long term treatment and the importance of lifestyle modifications.  AGREE: Multiple dietary modification options and treatment options were discussed and  Pedro agreed to keep a food journal with [phone] calories and 70+ grams  protein  We discussed the following Behavioral Modification Stratagies today: increasing lean protein intake, holiday eating strategies  and avoiding temptations  We spent > than 50% of the 15 minute visit on the counseling as documented in the note.

## 2017-08-09 NOTE — Telephone Encounter (Signed)
Pt called stating [facility] [address] [facility] not received Vit D refill authorization from Dr. Leonard SchwartzB. Thank you.

## 2017-08-10 ENCOUNTER — Telehealth: Payer: Self-pay | Admitting: Family Medicine

## 2017-08-10 DIAGNOSIS — G4700 Insomnia, unspecified: Secondary | ICD-10-CM

## 2017-08-10 NOTE — Telephone Encounter (Signed)
Pt would like Dr SwazilandJordan to either up her  traZODone (DESYREL) 100 MG tablet   To 150 mg or change it altogether, because the current dose she is on is not working and she is not sleeping.

## 2017-08-11 NOTE — Telephone Encounter (Signed)
Pt following up on refill request

## 2017-08-12 NOTE — Telephone Encounter (Signed)
Pt is calling check on the status of the Rx increase. Pt is aware that Dr. SwazilandJordan is out of the office today and will be returning on 08/13/17.

## 2017-08-12 NOTE — Telephone Encounter (Signed)
Trazodone can be increased from 100 mg to 150 mg but do not take it with Tramadol because it can increase risk of side effects and cause interaction. Good sleep hygiene. At the same time she can take Melatonin 5 mg OTC. F/U in 2 months [if she does not have an appt yet] Thanks, BJ

## 2017-08-13 MED ORDER — TRAZODONE HCL 100 MG PO TABS: 150.0000 mg | ORAL_TABLET | Freq: Every day | ORAL | 0 refills | Status: DC

## 2017-08-13 NOTE — Telephone Encounter (Signed)
Spoke with patient, rx sent

## 2017-08-16 ENCOUNTER — Encounter: Payer: Self-pay | Admitting: Family Medicine

## 2017-08-18 ENCOUNTER — Ambulatory Visit (INDEPENDENT_AMBULATORY_CARE_PROVIDER_SITE_OTHER): Payer: 59 | Admitting: Family Medicine

## 2017-08-29 NOTE — Progress Notes (Deleted)
[facility]  PATIENT: Meredith Byrd DOB: 1981-05-20   REASON FOR VISIT: follow up for migraine HISTORY FROM:patient    HISTORY OF PRESENT ILLNESS:Meredith Byrd 36 years old right-handed African-American female, is referred by her primary care physician nurse practitioner Marva Panda for evaluation of migraine headaches.Initial evaluation was in February 2016.  She had long-standing history of migraine, since elementary school, her typical migraine are lateralized severe pounding headache with associated light noise sensitivity, nauseous, can last for few days, even after few weeks. She had a history of gastric bypass, lost 60 pounds in 2008, her weight has been stable  I saw her previously, while taking Topamax, her headache is much less frequent, less severe, but she has not take any preventive medications since 2014.  She now complains of increased headache frequency, since 2014, she is having 3-4 typical migraine headaches each week, each episode last for a few days, in January, she has one severe episode of migraine lasting for 2 weeks. She was not able to identify triggers, she has tried triptan treatment in the past, Imitrex, cause heart racing, tightness, Maxalt, similar side effect, without helping her headache much.  Over the past 18 months, she was given Stadol nasal spray for headaches, she gets 3 bottles of 10 mg Stadol nasal spray each month, this was prescribed by her previous primary care physician [facility], but always has recurrent headaches shortly afterwards.  UPDATE March 24th 2016: She reported only temporary partial relief with previous nerve block in November 23 2014, she had tried Zomig nasal spray 10 times, without helping her headaches, Previously Preventive medications, Topamax works for her headaches, but she is trying to conceiving again, does not want to take too much medications, I have went over  other possibilities including Inderal, nortriptyline, all belongs to category C, I also advised her avoiding triptan, and Stadol treatment during pregnancy,  She had 4 children, no significant headache during her previous pregnancy  UPDATE Feb 09 2017: She continue complains of frequent migraines about 10-15 days each month, sometimes preceded by visual distortion, usually short lasting, but sometimes she woke up with severe migraine headache, last visit was in March 2016, over the past 2 years, she was managed by her primary care physician, per patient, she was given prescription of Stadol, tramadol, but we were not able to find her refill information from [facility].  She states she has tried and failed multiple preventative medications, Topamax,cause too much memory issues, almost lost her job,  For abortive treatment she has tried Maxalt, Zomig, sumatriptan, none of above works for her, some of the medication even made it more intense. UPDATE 07/24/2018CM Meredith Byrd, 36 year old female returns for follow up with history of migraines. She has approx 15 headaches per month. She has failed multiple preventives to include Topamax, Inderal and acute medications of Imitrex, Stadol Maxalt and Zomig nasal spray. On further questioning of headaches, she wakes up with headaches in the morning, she complains of fatigue and daytime drowsiness. She has never had a sleep study. She is obese with BMI of 47 I discussed sleep study with her. She has never tried preventive med Depakote. She returns for reevaluation  REVIEW OF SYSTEMS: Full 14 system review of systems performed and notable only for those listed, all others are neg:  Constitutional: fatigue Cardiovascular: neg Ear/Nose/Throat: neg  Skin: neg Eyes: blurred vision, light sensitivity Respiratory: neg Gastroitestinal: neg  Hematology/Lymphatic: neg  Endocrine: neg Musculoskeletal:neg Allergy/Immunology: neg Neurological:  migraines Psychiatric: neg Sleep : morning headache, daytime drowsiness   ALLERGIES: Allergies  Allergen Reactions  . Fioricet [Butalbital-Apap-Caffeine] Other (See Comments)    GI upset  . Inderal [Propranolol] Nausea Only    Dropped BP too low    HOME MEDICATIONS: Outpatient Medications Prior to Visit  Medication Sig Dispense Refill  . docusate sodium (COLACE) 100 MG capsule Take 1 capsule (100 mg total) by mouth 2 (two) times daily. 60 capsule 1  . ferrous sulfate (FEOSOL) 325 (65 FE) MG tablet Take 1 tablet (325 mg total) by mouth 2 (two) times daily with a meal. 60 tablet 1  . gabapentin (NEURONTIN) 300 MG capsule Take 1 capsule (300 mg total) by mouth at bedtime. 90 capsule 0  . omeprazole (PRILOSEC) 40 MG capsule TAKE ONE CAPSULE BY MOUTH EVERY DAY 90 capsule 1  . ranitidine (ZANTAC) 300 MG tablet Take 1 tablet (300 mg total) by mouth at bedtime. 90 tablet 1  . tiZANidine (ZANAFLEX) 4 MG tablet Take 1 tablet (4 mg total) by mouth 2 (two) times daily. Prn for muscle spasms 180 tablet 0  . traMADol (ULTRAM) 50 MG tablet TAKE 1 TABLET BY MOUTH ONCE DAILY AS NEEDED FOR MIGRAINES 30 tablet 1  . traZODone (DESYREL) 100 MG tablet Take 1.5 tablets (150 mg total) by mouth at bedtime. 135 tablet 0  . Vitamin D, Ergocalciferol, (DRISDOL) 50000 units CAPS capsule Take 1 capsule (50,000 Units total) by mouth every 7 (seven) days. 4 capsule 0   No facility-administered medications prior to visit.     PAST MEDICAL HISTORY: Past Medical History:  Diagnosis Date  . Allergy   . Anemia   . Anxiety   . Arthritis pain 08/20/2014  . Back pain   . GERD (gastroesophageal reflux disease)   . History of stress test 02/2011 ([facility])   there was no evidence of ischemia, but she only went 3 1/2 minutes on the treadmill making it very difficult to get a good accurate assessment, however  . Hx of echocardiogram    The echocadiogram was essentially normal with the exception of mild mitral calcification  and borderline concentric LVH, which in the setting of her hypertension at this early age is something that mean her blood pressure is well controlled.   . Hypertension   . Iron deficiency anemia, unspecified 07/09/2014  . Migraines   . Palpitations   . Swelling   . Vitamin B12 deficiency 03/17/2016    PAST SURGICAL HISTORY: Past Surgical History:  Procedure Laterality Date  . BREAST SURGERY    . CHOLECYSTECTOMY    . GASTRIC BYPASS  2008  . OOPHORECTOMY Right 2010    FAMILY HISTORY: Family History  Problem Relation Age of Onset  . Stroke Maternal Grandmother   . Heart disease Maternal Grandmother   . Hypertension Maternal Grandmother   . Hyperlipidemia Maternal Grandmother   . Asthma Mother   . Heart disease Mother   . Diabetes Mother   . Early death Mother   . Hypertension Mother   . Thyroid disease Mother   . Anxiety disorder Mother   . Hypertension Father   . Heart disease Father     SOCIAL HISTORY: Social History   Socioeconomic History  . Marital status: Married    Spouse name: Damon  . Number of children: 4  . Years of education: Associates  . Highest education level: Not on file  Social Needs  . Financial resource strain: Not on file  . Food insecurity -  worry: Not on file  . Food insecurity - inability: Not on file  . Transportation needs - medical: Not on file  . Transportation needs - non-medical: Not on file  Occupational History  . Occupation: Human resources officer  Tobacco Use  . Smoking status: Never Smoker  . Smokeless tobacco: Never Used  Substance and Sexual Activity  . Alcohol use: No  . Drug use: No  . Sexual activity: Yes  Other Topics Concern  . Not on file  Social History Narrative   Right handed.   Lives at home with husband and four children.   No caffeine use.     PHYSICAL EXAM  There were no vitals filed for this visit. There is no height or weight on file to calculate BMI.  Generalized: Well developed, in no acute distress    Head: normocephalic and atraumatic,. Oropharynx benign  Neck: Supple, no carotid bruits  Cardiac: Regular rate rhythm, no murmur  Musculoskeletal: No deformity   Neurological examination   Mentation: Alert oriented to time, place, history taking. Attention span and concentration appropriate. Recent and remote memory intact.  Follows all commands speech and language fluent.   Cranial nerve II-XII: Pupils were equal round reactive to light extraocular movements were full, visual field were full on confrontational test. Facial sensation and strength were normal. hearing was intact to finger rubbing bilaterally. Uvula tongue midline. head turning and shoulder shrug were normal and symmetric.Tongue protrusion into cheek strength was normal. Motor: normal bulk and tone, full strength in the BUE, BLE, fine finger movements normal, no pronator drift. No focal weakness Sensory: normal and symmetric to light touch, pinprick, and  Vibration,in the upper and lower extremities  Coordination: finger-nose-finger, heel-to-shin bilaterally, no dysmetria Reflexes: symmetric upper and lower plantar responses were flexor bilaterally. Gait and Station: Rising up from seated position without assistance, normal stance,  moderate stride, good arm swing, smooth turning, able to perform tiptoe, and heel walking without difficulty. Tandem gait is steady  DIAGNOSTIC DATA (LABS, IMAGING, TESTING) - I reviewed patient records, labs, notes, testing and imaging myself where available.  Lab Results  Component Value Date   WBC 6.1 06/29/2017   HGB 10.8 (L) 06/29/2017   HCT 35.5 06/29/2017   MCV 75 (L) 06/29/2017   PLT 440.0 (H) 01/19/2017      Component Value Date/Time   NA 136 06/29/2017 0917   K 4.6 06/29/2017 0917   CL 100 06/29/2017 0917   CO2 23 06/29/2017 0917   GLUCOSE 85 06/29/2017 0917   GLUCOSE 83 01/19/2017 0857   BUN 7 06/29/2017 0917   CREATININE 0.74 06/29/2017 0917   CREATININE 0.65 03/22/2014  1217   CALCIUM 9.3 06/29/2017 0917   PROT 6.7 06/29/2017 0917   ALBUMIN 3.8 06/29/2017 0917   AST 21 06/29/2017 0917   ALT 32 06/29/2017 0917   ALKPHOS 72 06/29/2017 0917   BILITOT 0.4 06/29/2017 0917   GFRNONAA 104 06/29/2017 0917   GFRNONAA >89 03/22/2014 1217   GFRAA 120 06/29/2017 0917   GFRAA >89 03/22/2014 1217   Lab Results  Component Value Date   CHOL 109 06/29/2017   HDL 46 06/29/2017   LDLCALC 47 06/29/2017   TRIG 81 06/29/2017   CHOLHDL 2 01/19/2017    Lab Results  Component Value Date   TSH 0.708 06/29/2017      ASSESSMENT AND PLAN  36 y.o. year old female  has a past medical history of Anemia; Arthritis pain (08/20/2014);  Hypertension; Iron deficiency anemia,  Migraines; Palpitations; and Vitamin B12 deficiency (03/17/2016). here to follow up for chronic migraine headaches. She has tried and failed multiple preventives in the past.to include Topamax, Inderal and acute medications of Imitrex, Stadol Maxalt and Zomig nasal spray.                          PLAN;Will set up for sleep study due to morning headaches and fatigue daytime drowsiness May try Depakote in the future Will refill tramadol 50 mg tablets 301 when necessary for migraine Follow-up 4 months I explained in particular the risks and ramifications of untreated moderate to severe OSA, especially with respect to cardiovascular disease  including congestive heart failure, difficult to treat hypertension, cardiac arrhythmias, or stroke. Even type 2 diabetes has, in part, been linked to untreated OSA. Symptoms of untreated OSA include daytime sleepiness, memory problems, mood irritability and mood disorder such as depression and anxiety, lack of energy, as well as recurrent headaches, especially morning headaches. . I encouraged the patient to eat healthy, exercise daily and keep well hydrated, to keep a scheduled bedtime and wake time routine, to not skip any meals and eat healthy snacks in between meals I  spent 25min  in total face to face time with the patient more than 50% of which was spent counseling and coordination of care, reviewing test results reviewing medications and discussing and reviewing the diagnosis of untreated obstructive sleep apnea see above migraine Meredith Byrd, [facility] LPGNP, [facility], APRN  [facility] [address], [address] [place], [place] [address] [phone]

## 2017-08-30 ENCOUNTER — Ambulatory Visit: Payer: 59 | Admitting: Nurse Practitioner

## 2017-08-31 ENCOUNTER — Encounter: Payer: Self-pay | Admitting: Nurse Practitioner

## 2017-09-08 ENCOUNTER — Ambulatory Visit (INDEPENDENT_AMBULATORY_CARE_PROVIDER_SITE_OTHER): Payer: 59 | Admitting: Family Medicine

## 2017-09-08 ENCOUNTER — Encounter (INDEPENDENT_AMBULATORY_CARE_PROVIDER_SITE_OTHER): Payer: Self-pay

## 2017-09-09 ENCOUNTER — Telehealth: Payer: Self-pay | Admitting: Neurology

## 2017-09-09 ENCOUNTER — Other Ambulatory Visit: Payer: Self-pay | Admitting: Nurse Practitioner

## 2017-09-09 NOTE — Telephone Encounter (Signed)
Pt request refill for traMADol (ULTRAM) 50 MG tablet . She said it can be refilled 09/11/17 which is Saturday. Please [facility] to [facility]

## 2017-09-10 ENCOUNTER — Ambulatory Visit (INDEPENDENT_AMBULATORY_CARE_PROVIDER_SITE_OTHER): Payer: 59 | Admitting: Neurology

## 2017-09-10 ENCOUNTER — Other Ambulatory Visit: Payer: Self-pay | Admitting: *Deleted

## 2017-09-10 DIAGNOSIS — R351 Nocturia: Secondary | ICD-10-CM

## 2017-09-10 DIAGNOSIS — G4710 Hypersomnia, unspecified: Secondary | ICD-10-CM | POA: Diagnosis not present

## 2017-09-10 DIAGNOSIS — R51 Headache: Secondary | ICD-10-CM

## 2017-09-10 DIAGNOSIS — Z6841 Body Mass Index (BMI) 40.0 and over, adult: Secondary | ICD-10-CM

## 2017-09-10 DIAGNOSIS — G4719 Other hypersomnia: Secondary | ICD-10-CM

## 2017-09-10 DIAGNOSIS — R0683 Snoring: Secondary | ICD-10-CM

## 2017-09-10 DIAGNOSIS — G2581 Restless legs syndrome: Secondary | ICD-10-CM

## 2017-09-10 DIAGNOSIS — Z9884 Bariatric surgery status: Secondary | ICD-10-CM

## 2017-09-10 DIAGNOSIS — G478 Other sleep disorders: Secondary | ICD-10-CM

## 2017-09-10 DIAGNOSIS — R519 Headache, unspecified: Secondary | ICD-10-CM

## 2017-09-10 DIAGNOSIS — G4720 Circadian rhythm sleep disorder, unspecified type: Secondary | ICD-10-CM

## 2017-09-10 MED ORDER — TRAMADOL HCL 50 MG PO TABS: ORAL_TABLET | ORAL | 1 refills | Status: DC

## 2017-09-10 NOTE — Telephone Encounter (Signed)
Reviewed narcotic registry okay to refill

## 2017-09-10 NOTE — Telephone Encounter (Signed)
Tramadol refill Rx successfully faxed to [facility] [address]

## 2017-09-10 NOTE — Addendum Note (Signed)
Addended by: Beverely LowMARTIN, NANCY C on: 09/10/2017 11:43 AM   Modules accepted: Orders

## 2017-09-13 NOTE — Progress Notes (Signed)
Patient referred by CM/Dr. Terrace ArabiaYan, seen by me on 07/08/17, diagnostic PSG on 09/10/17.   Please call and notify the patient that the recent sleep study did not show any significant obstructive sleep apnea. Only mild and intermittent snoring was noted; for disturbing snoring, an oral appliance (through a qualified dentist) can be considered.  No significant leg movements; overall benign results.  Please inform patient that she can FU as planned with CM or Dr. Terrace ArabiaYan. Please remind patient to try to maintain good sleep hygiene, which means: Keep a regular sleep and wake schedule and make enough time for sleep (7 1/2 to 8 1/2 hours for the average adult), try not to exercise or have a meal within 2 hours of your bedtime, try to keep your bedroom conducive for sleep, that is, cool and dark, without light distractors such as an illuminated alarm clock, and refrain from watching TV right before sleep or in the middle of the night and do not keep the TV or radio on during the night. If a nightlight is used, have it away from the visual field. Also, try not to use or play on electronic devices at bedtime, such as your cell phone, tablet PC or laptop. If you like to read at bedtime on an electronic device, try to dim the background light as much as possible. Do not eat in the middle of the night. Keep pets away from the bedroom environment. For stress relief, try meditation, deep breathing exercises (there are many books and CDs available), a white noise machine or fan can help to diffuse other noise distractors, such as traffic noise. Do not drink alcohol before bedtime, as it can disturb sleep and cause middle of the night awakenings. Never mix alcohol and sedating medications! Avoid narcotic pain medication close to bedtime, as opioids/narcotics can suppress breathing drive and breathing effort.    Thanks,  Huston FoleySaima Kerigan Narvaez, MD, PhD [facility]

## 2017-09-13 NOTE — Procedures (Signed)
PATIENT'S NAME:  Meredith Byrd, Loan DOB:      Jan 09, 1981      MR#:    [id number]     DATE OF RECORDING: 09/10/2017 REFERRING M.D.: Darrol Angelarolyn Martin, NP/Dr. Terrace ArabiaYan,         PCP: Betty SwazilandJordan, MD Study Performed: Baseline Polysomnogram HISTORY: 36 year old woman with a history of migraine headaches, allergies, anemia, arthritis, reflux disease, hypertension, palpitations, vitamin B12 deficiency and morbid obesity, who reports snoring and excessive daytime somnolence as well as morning headaches. The patient endorsed the Epworth Sleepiness Scale at 8 points. The patient's weight 245 pounds with a height of 59 (inches), resulting in a BMI of 49.3 kg/m2. The patient's neck circumference measured 15 inches.  CURRENT MEDICATIONS: Colace, Feosol, Neurontin, Prilosec, Zantac, Zanaflex, Ultram, Desyrel, Drisdol   PROCEDURE:  This is a multichannel digital polysomnogram utilizing the Somnostar 11.2 system.  Electrodes and sensors were applied and monitored per AASM Specifications.   EEG, EOG, Chin and Limb EMG, were sampled at 200 Hz.  ECG, Snore and Nasal Pressure, Thermal Airflow, Respiratory Effort, CPAP Flow and Pressure, Oximetry was sampled at 50 Hz. Digital video and audio were recorded.      BASELINE STUDY  Lights Out was at 22:49 and Lights On at 04:56.  Total recording time (TRT) was 367.5 minutes, with a total sleep time (TST) of  332 minutes. The patient's sleep latency was 19.5 minutes. REM latency was 218 minutes, which is markedly delayed. The sleep efficiency was 90.3%.     SLEEP ARCHITECTURE: WASO (Wake after sleep onset) was 15 minutes.  There were 17.5 minutes in Stage N1, 113 minutes Stage N2, 153 minutes Stage N3 and 48.5 minutes in Stage REM.  The percentage of Stage N1 was 5.3%, Stage N2 was 34.%, Stage N3 was 46.1%, which is increased, and Stage R (REM sleep) was 14.6%, which is reduced. The arousals were noted as: 20 were spontaneous, 4 were associated with PLMs, 0 were associated with  respiratory events.  Audio and video analysis did not show any abnormal or unusual movements, behaviors, phonations or vocalizations. The patient took 1 bathroom break. Mild intermittent snoring was noted. The EKG was in keeping with normal sinus rhythm (NSR).   RESPIRATORY ANALYSIS:  There were a total of 1 respiratory events:  0 obstructive apneas, 0 central apneas and 0 mixed apneas with a total of 0 apneas and an apnea index (AI) of 0 /hour. There were 1 hypopneas with a hypopnea index of .2 /hour. The patient also had 0 respiratory event related arousals (RERAs).      The total APNEA/HYPOPNEA INDEX (AHI) was .2/hour and the total RESPIRATORY DISTURBANCE INDEX was .2 /hour.  0 events occurred in REM sleep and 2 events in NREM. The REM AHI was 0 /hour, versus a non-REM AHI of .2. The patient spent 34.5 minutes of total sleep time in the supine position and 298 minutes in non-supine.. The supine AHI was 0.0 versus a non-supine AHI of 0.2.  OXYGEN SATURATION & C02:  The Wake baseline 02 saturation was 98%, with the lowest being 89%. Time spent below 89% saturation equaled 0 minutes.  PERIODIC LIMB MOVEMENTS: The patient had a total of 29 Periodic Limb Movements.  The Periodic Limb Movement (PLM) index was 5.2 and the PLM Arousal index was .7/hour.  Post-study, the patient indicated that sleep was worse than usual.   IMPRESSION:  1. Primary Snoring 2. Dysfunctions associated with sleep stages or arousal from sleep  RECOMMENDATIONS:  1. This  study does not demonstrate any significant obstructive or central sleep disordered breathing. Only mild and intermittent snoring was noted; for disturbing snoring, an oral appliance (through a qualified dentist) can be considered. This study does not support an intrinsic sleep disorder as a cause of the patient's symptoms. Other causes, including circadian rhythm disturbances, an underlying mood disorder, medication effect and/or an underlying medical problem  cannot be ruled out. 2. This study shows overall minimal sleep fragmentation and mildly abnormal sleep stage percentages; these are nonspecific findings and per se do not signify an intrinsic sleep disorder or a cause for the patient's sleep-related symptoms. Causes include (but are not limited to) the first night effect of the sleep study, circadian rhythm disturbances, medication effect or an underlying mood disorder or medical problem.  3. The patient should be cautioned not to drive, work at heights, or operate dangerous or heavy equipment when tired or sleepy. Review and reiteration of good sleep hygiene measures should be pursued with any patient. 4. The patient can follow-up with her referring provider, who will be notified of the test results.  I certify that I have reviewed the entire raw data recording prior to the issuance of this report in accordance with the Standards of Accreditation of the American Academy of Sleep Medicine (AASM)   Huston FoleySaima Herberta Pickron, MD, PhD Diplomat, American Board of Psychiatry and Neurology (Neurology and Sleep Medicine)

## 2017-09-14 ENCOUNTER — Telehealth: Payer: Self-pay

## 2017-09-14 NOTE — Telephone Encounter (Signed)
I called pt. I advised pt that Dr. Frances FurbishAthar reviewed pt's sleep study and found that pt did not show any significant osa and only found mild and intermittent snoring. I advised her that if her snoring is disturbing, she can speak with her dentist about an oral appliance. No significant leg movements were seen and her study was overall benign. Dr. Frances FurbishAthar recommends that pt follow up with Dr. Terrace ArabiaYan and Eber Jonesarolyn, NP as planned. I reviewed sleep hygiene recommendations with the pt, including trying to keep a regular sleep wake schedule, avoiding electronics in the bedroom, keeping the bedroom cool, dark, and quiet, and avoiding eating or exercising within 2 hours of bedtime as well as eating in the middle of the night. I advised pt to keep pets away from the bedtime. I discussed with pt the importance of stress relief and to try meditation, deep breathing exercises, and/or a white noise machine or fan to diffuse other noise distracters. I advised pt to not drink alcohol before bedtime and to never mix alcohol and sedating medications. Pt was advised to avoid narcotic pain medication close to bedtime. I advised pt that a copy of these sleep study results will be sent to Dr. Terrace ArabiaYan. Pt verbalized understanding of results. Pt had no questions at this time but was encouraged to call back if questions arise.

## 2017-09-14 NOTE — Telephone Encounter (Signed)
I called pt to discuss her sleep study results. No answer, left a message asking her to call me back. 

## 2017-09-14 NOTE — Telephone Encounter (Signed)
-----   Message from Huston FoleySaima Athar, MD sent at 09/13/2017  5:55 PM EST ----- Patient referred by CM/Dr. Terrace ArabiaYan, seen by me on 07/08/17, diagnostic PSG on 09/10/17.   Please call and notify the patient that the recent sleep study did not show any significant obstructive sleep apnea. Only mild and intermittent snoring was noted; for disturbing snoring, an oral appliance (through a qualified dentist) can be considered.  No significant leg movements; overall benign results.  Please inform patient that she can FU as planned with CM or Dr. Terrace ArabiaYan. Please remind patient to try to maintain good sleep hygiene, which means: Keep a regular sleep and wake schedule and make enough time for sleep (7 1/2 to 8 1/2 hours for the average adult), try not to exercise or have a meal within 2 hours of your bedtime, try to keep your bedroom conducive for sleep, that is, cool and dark, without light distractors such as an illuminated alarm clock, and refrain from watching TV right before sleep or in the middle of the night and do not keep the TV or radio on during the night. If a nightlight is used, have it away from the visual field. Also, try not to use or play on electronic devices at bedtime, such as your cell phone, tablet PC or laptop. If you like to read at bedtime on an electronic device, try to dim the background light as much as possible. Do not eat in the middle of the night. Keep pets away from the bedroom environment. For stress relief, try meditation, deep breathing exercises (there are many books and CDs available), a white noise machine or fan can help to diffuse other noise distractors, such as traffic noise. Do not drink alcohol before bedtime, as it can disturb sleep and cause middle of the night awakenings. Never mix alcohol and sedating medications! Avoid narcotic pain medication close to bedtime, as opioids/narcotics can suppress breathing drive and breathing effort.    Thanks,  Huston FoleySaima Athar, MD, PhD [facility]

## 2017-09-15 ENCOUNTER — Encounter: Payer: Self-pay | Admitting: *Deleted

## 2017-09-21 ENCOUNTER — Ambulatory Visit: Payer: 59 | Admitting: Family Medicine

## 2017-09-24 ENCOUNTER — Other Ambulatory Visit: Payer: Self-pay | Admitting: Family Medicine

## 2017-09-24 DIAGNOSIS — Z139 Encounter for screening, unspecified: Secondary | ICD-10-CM

## 2017-10-01 ENCOUNTER — Ambulatory Visit: Payer: 59 | Admitting: Family Medicine

## 2017-10-04 ENCOUNTER — Ambulatory Visit (INDEPENDENT_AMBULATORY_CARE_PROVIDER_SITE_OTHER): Payer: 59 | Admitting: Family Medicine

## 2017-10-04 ENCOUNTER — Encounter: Payer: Self-pay | Admitting: Family Medicine

## 2017-10-04 VITALS — BP 126/82 | HR 76 | Temp 98.6°F | Resp 12 | Ht 59.0 in | Wt 249.1 lb

## 2017-10-04 DIAGNOSIS — F411 Generalized anxiety disorder: Secondary | ICD-10-CM | POA: Insufficient documentation

## 2017-10-04 DIAGNOSIS — G4700 Insomnia, unspecified: Secondary | ICD-10-CM

## 2017-10-04 DIAGNOSIS — G8929 Other chronic pain: Secondary | ICD-10-CM

## 2017-10-04 DIAGNOSIS — M5441 Lumbago with sciatica, right side: Secondary | ICD-10-CM

## 2017-10-04 DIAGNOSIS — M25562 Pain in left knee: Secondary | ICD-10-CM | POA: Diagnosis not present

## 2017-10-04 MED ORDER — CELECOXIB 100 MG PO CAPS: 100.0000 mg | ORAL_CAPSULE | Freq: Two times a day (BID) | ORAL | 1 refills | Status: DC

## 2017-10-04 MED ORDER — SERTRALINE HCL 50 MG PO TABS: 50.0000 mg | ORAL_TABLET | Freq: Every day | ORAL | 3 refills | Status: DC

## 2017-10-04 MED ORDER — DOXEPIN HCL 10 MG/ML PO CONC: 3.0000 mg | Freq: Every day | ORAL | 0 refills | Status: DC

## 2017-10-04 NOTE — Progress Notes (Signed)
ACUTE VISIT   HPI:  Chief Complaint  Patient presents with  . Back Pain    started over a year ago  . Anxiety    getting worse  . Left knee pain    pain going up and down stairs, unable to bear weight    [name] is a 36 y.o. female, who is here today complaining of worsening anxiety. She states that she has had anxiety "for a while", worse for the past 3-4 years. She denies depression or suicidal thoughts.  She has followed with psychiatrist before, 3-4 years ago, after her mother passed away. She has not tried medication before.  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   Insomnia:  She has discontinued Trazodone because it was not longer helping with sleep.  She has tried Ambien in the past. She has trouble falling asleep and wakes up a few times during the night.  She is also c/o lower back pain. This is a chronic problems. Pain radiates to both extremities intermittently.  She has refused ortho evaluation. She discontinued Gabapentin, which she did not feel it was helping with "sciatic"pain. Sharp pain,constant, exacerbated by walking and prolonged standing. Alleviated by rest. She denies saddle anesthesia,urine or bowel incontinence.  She is no longer on Tramadol, which she was taking for migraines.  Left knee pain, which she has had for about 5-6 months and worse for the past 2 months. Denies Hx of trauma. No edema or erythema. She does not take OTC analgesic because she cannot take Ibuprofen due to GERD.  She has not changed her diet, doe snot follow a healthy diet. She does not exercise regularly.    Review of Systems  Constitutional: Positive for fatigue. Negative for activity change, appetite change, fever and unexpected weight change.  HENT: Negative for mouth sores, sore throat and trouble swallowing.   Respiratory: Negative  for cough, shortness of breath and wheezing.   Cardiovascular: Negative for leg swelling.  Gastrointestinal: Negative for abdominal pain, blood in stool, nausea and vomiting.       Negative for changes in bowel habits or fecal incontinence.  Endocrine: Negative for cold intolerance and heat intolerance.  Genitourinary: Negative for decreased urine volume, dysuria, hematuria, vaginal bleeding and vaginal discharge.       Negative for urine incontinence.  Musculoskeletal: Positive for arthralgias and back pain. Negative for joint swelling and neck pain.  Skin: Negative for rash.  Neurological: Negative for syncope, weakness, numbness and headaches.  Hematological: Negative for adenopathy. Does not bruise/bleed easily.  Psychiatric/Behavioral: Positive for sleep disturbance. Negative for confusion, hallucinations and suicidal ideas. The patient is nervous/anxious.       Current Outpatient Medications on File Prior to Visit  Medication Sig Dispense Refill  . docusate sodium (COLACE) 100 MG capsule Take 1 capsule (100 mg total) by mouth 2 (two) times daily. 60 capsule 1  . omeprazole (PRILOSEC) 40 MG capsule TAKE ONE CAPSULE BY MOUTH EVERY DAY 90 capsule 1  . ranitidine (ZANTAC) 300 MG tablet Take 1 tablet (300 mg total) by mouth at bedtime. 90 tablet 1  . tiZANidine (ZANAFLEX) 4 MG tablet Take 1 tablet (4 mg total) by mouth 2 (two) times daily. Prn for muscle spasms 180 tablet 0  . Vitamin D, Ergocalciferol, (DRISDOL) 50000 units CAPS capsule Take 1 capsule (50,000 Units total) by mouth every 7 (seven) days. 4 capsule  0   No current facility-administered medications on file prior to visit.      Past Medical History:  Diagnosis Date  . Allergy   . Anemia   . Anxiety   . Arthritis pain 08/20/2014  . Back pain   . GERD (gastroesophageal reflux disease)   . History of stress test 02/2011 ([facility])   there was no evidence of ischemia, but she only went 3 1/2 minutes on the treadmill making it  very difficult to get a good accurate assessment, however  . Hx of echocardiogram    The echocadiogram was essentially normal with the exception of mild mitral calcification and borderline concentric LVH, which in the setting of her hypertension at this early age is something that mean her blood pressure is well controlled.   . Hypertension   . Iron deficiency anemia, unspecified 07/09/2014  . Migraines   . Palpitations   . Swelling   . Vitamin B12 deficiency 03/17/2016   Allergies  Allergen Reactions  . Fioricet [Butalbital-Apap-Caffeine] Other (See Comments)    GI upset  . Inderal [Propranolol] Nausea Only    Dropped BP too low    Social History   Socioeconomic History  . Marital status: Married    Spouse name: Meredith Byrd  . Number of children: 4  . Years of education: Associates  . Highest education level: None  Social Needs  . Financial resource strain: None  . Food insecurity - worry: None  . Food insecurity - inability: None  . Transportation needs - medical: None  . Transportation needs - non-medical: None  Occupational History  . 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 Smoking status: Never Smoker  . Smokeless tobacco: Never Used  Substance and Sexual Activity  . Alcohol use: No  . Drug use: No  . Sexual activity: Yes  Other Topics Concern  . None  Social History Narrative   Right handed.   Lives at home with husband and four children.   No caffeine use.    Vitals:   10/04/17 0756  BP: 126/82  Pulse: 76  Resp: 12  Temp: 98.6 F (37 C)  SpO2: 100%   Body mass index is 50.32 kg/m.  Wt Readings from Last 3 Encounters:  10/04/17 249 lb 2 oz (113 kg)  08/03/17 239 lb (108.4 kg)  07/27/17 240 lb (108.9 kg)   Physical Exam  Nursing note and vitals reviewed. Constitutional: She is oriented to person, place, and time. She appears well-developed. She does not appear ill. No distress.  HENT:  Head: Normocephalic and atraumatic.  Eyes: Conjunctivae are  normal.  Cardiovascular: Normal rate and regular rhythm.  Pulses:      Dorsalis pedis pulses are 2+ on the right side, and 2+ on the left side.  Respiratory: Effort normal and breath sounds normal. No respiratory distress.  GI: Soft. She exhibits no mass. There is no tenderness.  Musculoskeletal: She exhibits no edema.       Thoracic back: She exhibits no tenderness and no bony tenderness.       Lumbar back: She exhibits tenderness (Around L4-5 paraspinal muscles,right.). She exhibits no bony tenderness.  L knee: on inspection no effusion, erythema, or significant deformities. Valgus and varus stress normal, anterior and posterior drawer test negative. Patellar apprehension test negative. Tenderness upon palpation of medial inter articular joint. No effusion or erythema.  Neurological: She is alert and oriented to person, place, and time. She has normal strength. Coordination and gait normal.  SLR  negative bilateral.  Skin: Skin is warm. No rash noted. No erythema.  Psychiatric: Her mood appears anxious. Her affect is blunt. She expresses no suicidal ideation.  Well groomed, good eye contact.      ASSESSMENT AND PLAN:   [name] was seen today for back pain, anxiety and left knee pain.  Diagnoses and all orders for this visit:  Insomnia, unspecified type  After discussion of pharmacologic options, she agrees with trying Doxepin. She has had palpitations with Amitriptyline. She will start Doxepin at 3 mg and increase it to 6 mg if well tolerated. Good sleep hygiene. F/U in 3-4 weeks.   -     doxepin (SINEQUAN) 10 MG/ML solution; Take 0.3-0.6 mLs (3-6 mg total) by mouth at bedtime.  Chronic low back pain with right-sided sciatica, unspecified back pain laterality  Refused referral to ortho. After discussion of some side effects of chronic use of NSAID's she agrees with trying Celebrex. Wt loss may help,recommended.  -     celecoxib (CELEBREX) 100 MG capsule; Take 1  capsule (100 mg total) by mouth 2 (two) times daily.  Left knee pain, unspecified chronicity  ? OA vs patellofemoral synd. Try to avoid trigger factors. Imaging recommended.  -     DG Knee Complete 4 Views Left; Future -     celecoxib (CELEBREX) 100 MG capsule; Take 1 capsule (100 mg total) by mouth 2 (two) times daily.  Generalized anxiety disorder   Side effects of SSRI's discussed. She agrees with trying Sertraline 25 mg and increase to 50 mg a few days after started if she tolerates well. Instructed about warning signs. Psychotherapy may help. F/U in 3-4 weeks,before if needed.  -     sertraline (ZOLOFT) 50 MG tablet; Take 1 tablet (50 mg total) by mouth daily.    Because she sis starting 2 new meds,instrucred not to start at the same time, wait 1-2 weeks in between.     Return in about 4 weeks (around 11/01/2017) for anxiety and sleep.     -[name] was advised to seek immediate medical attention if sudden worsening symptoms.     Arianny Pun G. SwazilandJordan, MD  [facility]. [place] office.

## 2017-10-04 NOTE — Patient Instructions (Addendum)
A few things to remember from today's visit:   Insomnia, unspecified type - Plan: doxepin (SINEQUAN) 10 MG/ML solution  Chronic low back pain with right-sided sciatica, unspecified back pain laterality - Plan: celecoxib (CELEBREX) 100 MG capsule  Left knee pain, unspecified chronicity - Plan: DG Knee Complete 4 Views Left, celecoxib (CELEBREX) 100 MG capsule  Generalized anxiety disorder - Plan: sertraline (ZOLOFT) 50 MG tablet  Do not start medications at the same time, wait 1-2 weeks in between. Sertraline half tablet for 5 days, then increase to the whole tolerated if well tolerated. Doxepin start with 0.3 mL then increased to 0.6 mL in a week if not helping with her sleep. Over-the-counter melatonin may also help.    Please be sure medication list is accurate. If a new problem present, please set up appointment sooner than planned today.

## 2017-10-17 ENCOUNTER — Other Ambulatory Visit: Payer: Self-pay | Admitting: Family Medicine

## 2017-10-17 DIAGNOSIS — G8929 Other chronic pain: Secondary | ICD-10-CM

## 2017-10-17 DIAGNOSIS — M5441 Lumbago with sciatica, right side: Principal | ICD-10-CM

## 2017-10-18 ENCOUNTER — Other Ambulatory Visit: Payer: Self-pay | Admitting: *Deleted

## 2017-10-18 ENCOUNTER — Other Ambulatory Visit: Payer: Self-pay | Admitting: Family Medicine

## 2017-10-18 ENCOUNTER — Telehealth: Payer: Self-pay | Admitting: Family Medicine

## 2017-10-18 MED ORDER — DOXEPIN HCL 25 MG PO CAPS: 25.0000 mg | ORAL_CAPSULE | Freq: Every day | ORAL | 0 refills | Status: DC

## 2017-10-18 NOTE — Telephone Encounter (Signed)
Pharmacy sent over refill for gabapentin 300 mg capsule, 90 day supply. Did not see on med list. Is it okay to refill?

## 2017-10-18 NOTE — Telephone Encounter (Signed)
Left voicemail informing patient of medication change and instructions from dr.

## 2017-10-18 NOTE — Telephone Encounter (Signed)
Copied from CRM [id number]. Topic: Quick Communication - Rx Refill/Question >> Oct 18, 2017 11:56 AM Crist InfanteHarrald, Kathy J wrote: Pt states the  doxepin (SINEQUAN) 10 MG/ML solution  Is not working. Pt would like to increase or change sleep aid altogether  [id number], [place] - [address] [address] [phone] (Phone) [phone] (Fax)

## 2017-10-18 NOTE — Telephone Encounter (Signed)
Rx for Doxepin 25 mg was sent to her pharmacy. Gabapentin she is not taking,she discontinued and I did not resume medication. Not recommended at this time. Please remind her to keep F/U appt as we discussed.  Thanks, BJ

## 2017-10-22 ENCOUNTER — Ambulatory Visit: Payer: 59

## 2017-10-28 ENCOUNTER — Ambulatory Visit (INDEPENDENT_AMBULATORY_CARE_PROVIDER_SITE_OTHER): Payer: Managed Care, Other (non HMO) | Admitting: Neurology

## 2017-10-28 ENCOUNTER — Encounter: Payer: Self-pay | Admitting: Neurology

## 2017-10-28 ENCOUNTER — Telehealth: Payer: Self-pay | Admitting: Neurology

## 2017-10-28 VITALS — BP 120/79 | HR 90 | Ht 59.0 in | Wt 252.0 lb

## 2017-10-28 DIAGNOSIS — G43019 Migraine without aura, intractable, without status migrainosus: Secondary | ICD-10-CM

## 2017-10-28 MED ORDER — TRAMADOL HCL 50 MG PO TABS: 50.0000 mg | ORAL_TABLET | ORAL | 5 refills | Status: DC | PRN

## 2017-10-28 MED ORDER — GALCANEZUMAB-GNLM 120 MG/ML ~~LOC~~ SOAJ: SUBCUTANEOUS | 6 refills | Status: DC

## 2017-10-28 NOTE — Progress Notes (Signed)
[facility]  PATIENT: Meredith Byrd DOB: Oct 01, 1981  HISTORY OF PRESENT ILLNESS: Meredith Byrd 37 years old right-handed African-American female, is referred by her primary care physician nurse practitioner Marva Panda for evaluation of migraine headaches.Initial evaluation was in February 2016.  She had long-standing history of migraine, since elementary school, her typical migraine are lateralized severe pounding headache with associated light noise sensitivity, nauseous, can last for few days, even after few weeks. She had a history of gastric bypass, lost 60 pounds in 2008, her weight has been stable  I saw her previously, while taking Topamax, her headache is much less frequent, less severe, but she has not take any preventive medications since 2014.  She now complains of increased headache frequency, since 2014, she is having 3-4 typical migraine headaches each week, each episode last for a few days, in January, she has one severe episode of migraine lasting for 2 weeks. She was not able to identify triggers, she has tried triptan treatment in the past, Imitrex, cause heart racing, tightness, Maxalt, similar side effect, without helping her headache much.  Over the past 18 months, she was given Stadol nasal spray for headaches, she gets 3 bottles of 10 mg Stadol nasal spray each month, this was prescribed by her previous primary care physician PA [facility], but always has recurrent headaches shortly afterwards.  UPDATE March 24th 2016: She reported only temporary partial relief with previous nerve block in November 23 2014, she had tried Zomig nasal spray 10 times, without helping her headaches, Previously Preventive medications, Topamax works for her headaches, but she is trying to conceiving again, does not want to take too much medications, I have went over other possibilities including Inderal, nortriptyline, all belongs to  category C, I also advised her avoiding triptan, and Stadol treatment during pregnancy,  She had 4 children, no significant headache during her previous pregnancy  UPDATE Feb 09 2017: She continue complains of frequent migraines about 10-15 days each month, sometimes preceded by visual distortion, usually short lasting, but sometimes she woke up with severe migraine headache, last visit was in March 2016, over the past 2 years, she was managed by her primary care physician, per patient, she was given prescription of Stadol, tramadol, but we were not able to find her refill information from [facility].  She states she has tried and failed multiple preventative medications, Topamax,cause too much memory issues, almost lost her job,  For abortive treatment she has tried Maxalt, Zomig, sumatriptan, none of above works for her, some of the medication even made it more intense.   UPDATE Oct 28 2017: She continue have 15-20 migraine headaches days each month, she has tried different preventive medications, Topamax, nortriptyline, Inderal, without helping her headaches,  She has suboptimal response to triptan treatment, imitrex, zomig, relpax, maxalt. taking tramadol 50 mg 2 tablets as needed for migraine,  We started CGRP antagonist Emglaity today,  REVIEW OF SYSTEMS: Full 14 system review of systems performed and notable only for those listed, all others are neg:     ALLERGIES: Allergies  Allergen Reactions  . Fioricet [Butalbital-Apap-Caffeine] Other (See Comments)    GI upset  . Inderal [Propranolol] Nausea Only    Dropped BP too low    HOME MEDICATIONS: Outpatient Medications Prior to Visit  Medication Sig Dispense Refill  . celecoxib (CELEBREX) 100 MG capsule Take 1 capsule (100 mg total) by mouth 2 (two) times daily. 60 capsule 1  . docusate sodium (  COLACE) 100 MG capsule Take 1 capsule (100 mg total) by mouth 2 (two) times daily. 60 capsule 1  . doxepin (SINEQUAN)  25 MG capsule Take 1 capsule (25 mg total) by mouth at bedtime. 30 capsule 0  . omeprazole (PRILOSEC) 40 MG capsule TAKE ONE CAPSULE BY MOUTH EVERY DAY 90 capsule 1  . ranitidine (ZANTAC) 300 MG tablet Take 1 tablet (300 mg total) by mouth at bedtime. 90 tablet 1  . sertraline (ZOLOFT) 50 MG tablet Take 1 tablet (50 mg total) by mouth daily. 30 tablet 3  . tiZANidine (ZANAFLEX) 4 MG tablet Take 1 tablet (4 mg total) by mouth 2 (two) times daily. Prn for muscle spasms 180 tablet 0  . traMADol (ULTRAM) 50 MG tablet Take 50 mg by mouth as needed.    . Vitamin D, Ergocalciferol, (DRISDOL) 50000 units CAPS capsule Take 1 capsule (50,000 Units total) by mouth every 7 (seven) days. 4 capsule 0   No facility-administered medications prior to visit.     PAST MEDICAL HISTORY: Past Medical History:  Diagnosis Date  . Allergy   . Anemia   . Anxiety   . Arthritis pain 08/20/2014  . Back pain   . GERD (gastroesophageal reflux disease)   . History of stress test 02/2011 ([facility])   there was no evidence of ischemia, but she only went 3 1/2 minutes on the treadmill making it very difficult to get a good accurate assessment, however  . Hx of echocardiogram    The echocadiogram was essentially normal with the exception of mild mitral calcification and borderline concentric LVH, which in the setting of her hypertension at this early age is something that mean her blood pressure is well controlled.   . Hypertension   . Iron deficiency anemia, unspecified 07/09/2014  . Migraines   . Palpitations   . Swelling   . Vitamin B12 deficiency 03/17/2016    PAST SURGICAL HISTORY: Past Surgical History:  Procedure Laterality Date  . BREAST SURGERY    . CHOLECYSTECTOMY    . GASTRIC BYPASS  2008  . OOPHORECTOMY Right 2010    FAMILY HISTORY: Family History  Problem Relation Age of Onset  . Stroke Maternal Grandmother   . Heart disease Maternal Grandmother   . Hypertension Maternal Grandmother   . Hyperlipidemia  Maternal Grandmother   . Asthma Mother   . Heart disease Mother   . Diabetes Mother   . Early death Mother   . Hypertension Mother   . Thyroid disease Mother   . Anxiety disorder Mother   . Hypertension Father   . Heart disease Father     SOCIAL HISTORY: Social History   Socioeconomic History  . Marital status: Married    Spouse name: Damon  . Number of children: 4  . Years of education: Associates  . Highest education level: Not on file  Social Needs  . Financial resource strain: Not on file  . Food insecurity - worry: Not on file  . Food insecurity - inability: Not on file  . Transportation needs - medical: Not on file  . Transportation needs - non-medical: Not on file  Occupational History  . Occupation: Human resources officer  Tobacco Use  . Smoking status: Never Smoker  . Smokeless tobacco: Never Used  Substance and Sexual Activity  . Alcohol use: No  . Drug use: No  . Sexual activity: Yes  Other Topics Concern  . Not on file  Social History Narrative   Right handed.  Lives at home with husband and four children.   No caffeine use.     PHYSICAL EXAM  Vitals:   10/28/17 1612  BP: 120/79  Pulse: 90  Weight: 252 lb (114.3 kg)  Height: 4\' 11"  (1.499 m)   Body mass index is 50.9 kg/m.  Generalized: Well developed, in no acute distress  Head: normocephalic and atraumatic,. Oropharynx benign  Neck: Supple, no carotid bruits  Cardiac: Regular rate rhythm, no murmur  Musculoskeletal: No deformity   Neurological examination   Mentation: Alert oriented to time, place, history taking. Attention span and concentration appropriate. Recent and remote memory intact.  Follows all commands speech and language fluent.   Cranial nerve II-XII: Pupils were equal round reactive to light extraocular movements were full, visual field were full on confrontational test. Facial sensation and strength were normal. hearing was intact to finger rubbing bilaterally. Uvula tongue  midline. head turning and shoulder shrug were normal and symmetric.Tongue protrusion into cheek strength was normal. Motor: normal bulk and tone, full strength in the BUE, BLE, fine finger movements normal, no pronator drift. No focal weakness Sensory: normal and symmetric to light touch, pinprick, and  Vibration,in the upper and lower extremities  Coordination: finger-nose-finger, heel-to-shin bilaterally, no dysmetria Reflexes: symmetric upper and lower plantar responses were flexor bilaterally. Gait and Station: Rising up from seated position without assistance, normal stance,  moderate stride, good arm swing, smooth turning, able to perform tiptoe, and heel walking without difficulty. Tandem gait is steady  DIAGNOSTIC DATA (LABS, IMAGING, TESTING) - I reviewed patient records, labs, notes, testing and imaging myself where available.  Lab Results  Component Value Date   WBC 6.1 06/29/2017   HGB 10.8 (L) 06/29/2017   HCT 35.5 06/29/2017   MCV 75 (L) 06/29/2017   PLT 440.0 (H) 01/19/2017      Component Value Date/Time   NA 136 06/29/2017 0917   K 4.6 06/29/2017 0917   CL 100 06/29/2017 0917   CO2 23 06/29/2017 0917   GLUCOSE 85 06/29/2017 0917   GLUCOSE 83 01/19/2017 0857   BUN 7 06/29/2017 0917   CREATININE 0.74 06/29/2017 0917   CREATININE 0.65 03/22/2014 1217   CALCIUM 9.3 06/29/2017 0917   PROT 6.7 06/29/2017 0917   ALBUMIN 3.8 06/29/2017 0917   AST 21 06/29/2017 0917   ALT 32 06/29/2017 0917   ALKPHOS 72 06/29/2017 0917   BILITOT 0.4 06/29/2017 0917   GFRNONAA 104 06/29/2017 0917   GFRNONAA >89 03/22/2014 1217   GFRAA 120 06/29/2017 0917   GFRAA >89 03/22/2014 1217   Lab Results  Component Value Date   CHOL 109 06/29/2017   HDL 46 06/29/2017   LDLCALC 47 06/29/2017   TRIG 81 06/29/2017   CHOLHDL 2 01/19/2017    Lab Results  Component Value Date   TSH 0.708 06/29/2017    ASSESSMENT AND PLAN  37 y.o. year old female    Chronic migraine headaches  Tried  and failed multiple preventive medications in the past, this include Topamax, Inderal, nortriptyline,  Suboptimal response to triptan treatment,   Continue tramadol as needed  Start see CGRP antagonism emagality  Levert FeinsteinYijun Jelina Paulsen, M.D. Ph.D.  [facility] [address] [place], [place] [address] Phone: [phone] Fax:      [phone]

## 2017-10-28 NOTE — Telephone Encounter (Signed)
Dr. Terrace ArabiaYan requested for patient to see NP Daphine DeutscherMartin for 6 month follow-up. Patient states that she wants to see Dr. Terrace ArabiaYan. She refused to see NP.

## 2017-10-28 NOTE — Telephone Encounter (Signed)
Patient has a follow up with Dr. Terrace ArabiaYan.

## 2017-10-30 ENCOUNTER — Other Ambulatory Visit: Payer: Self-pay | Admitting: Family Medicine

## 2017-11-01 ENCOUNTER — Telehealth: Payer: Self-pay | Admitting: Neurology

## 2017-11-01 ENCOUNTER — Other Ambulatory Visit: Payer: Self-pay | Admitting: *Deleted

## 2017-11-01 MED ORDER — GALCANEZUMAB-GNLM 120 MG/ML ~~LOC~~ SOAJ: SUBCUTANEOUS | 6 refills | Status: AC

## 2017-11-01 NOTE — Telephone Encounter (Signed)
[employer] PA approved by [employer] [phone]) through 11/01/2018.  Pt JW#[id number].  ZH#[id number].  I called to notify the pharmacy and the claim paid without any problem.  However, the co-pay for the prescription was $550.00  [employer] has a co-pay savings card available on their website for [employer] insurance plans that should bring the cost down to hopefully zero out-of-pocket.  I called the patient and she will complete the necessary information and take the savings card to the pharmacy.  She will call me back if she has any trouble.

## 2017-11-01 NOTE — Telephone Encounter (Signed)
Pt called a PA required.

## 2017-11-01 NOTE — Telephone Encounter (Signed)
Rx resent to her requested pharmacy.

## 2017-11-01 NOTE — Telephone Encounter (Signed)
Pt is requesting Galcanezumab-gnlm (EMGALITY) 120 MG/ML SOAJ [id number] [place] [place] - [address]

## 2017-11-07 ENCOUNTER — Other Ambulatory Visit: Payer: Self-pay | Admitting: Family Medicine

## 2017-11-08 ENCOUNTER — Other Ambulatory Visit: Payer: Self-pay | Admitting: Family Medicine

## 2017-11-08 DIAGNOSIS — G4700 Insomnia, unspecified: Secondary | ICD-10-CM

## 2017-11-09 NOTE — Telephone Encounter (Signed)
Please advise request for tizanidine.

## 2017-11-09 NOTE — Telephone Encounter (Signed)
Please advise request for trazodone. This is not on pt's current med list.

## 2017-11-11 ENCOUNTER — Ambulatory Visit (INDEPENDENT_AMBULATORY_CARE_PROVIDER_SITE_OTHER)
Admission: RE | Admit: 2017-11-11 | Discharge: 2017-11-11 | Disposition: A | Discharge status: Home / Self Care | Payer: Managed Care, Other (non HMO) | Source: Ambulatory Visit | Attending: Family Medicine | Admitting: Family Medicine

## 2017-11-11 ENCOUNTER — Other Ambulatory Visit: Payer: Self-pay | Admitting: *Deleted

## 2017-11-11 ENCOUNTER — Ambulatory Visit
Admission: RE | Admit: 2017-11-11 | Discharge: 2017-11-11 | Disposition: A | Discharge status: Home / Self Care | Payer: Managed Care, Other (non HMO) | Source: Ambulatory Visit | Attending: Family Medicine | Admitting: Family Medicine

## 2017-11-11 ENCOUNTER — Telehealth: Payer: Self-pay | Admitting: Neurology

## 2017-11-11 ENCOUNTER — Encounter: Payer: Self-pay | Admitting: Family Medicine

## 2017-11-11 DIAGNOSIS — M25562 Pain in left knee: Secondary | ICD-10-CM | POA: Diagnosis not present

## 2017-11-11 DIAGNOSIS — Z139 Encounter for screening, unspecified: Secondary | ICD-10-CM

## 2017-11-11 MED ORDER — ONDANSETRON HCL 4 MG PO TABS: 4.0000 mg | ORAL_TABLET | Freq: Three times a day (TID) | ORAL | 0 refills | Status: DC | PRN

## 2017-11-11 NOTE — Telephone Encounter (Signed)
Pt called she's had a nine day migraine. She has taken preventative medication thos Monday Galcanezumab-gnlm (EMGALITY) 120 MG/ML SOAJ and has taken tramadol off and on. She has not taken anything OTC. Pt is wanting to come in today for toradal cocktail. Please call asap.

## 2017-11-11 NOTE — Telephone Encounter (Signed)
Per vo by Dr. Terrace ArabiaYan, the patient can come in for the following IV medications:  1) Depakote 1gram 2) Ketorolac 30mg   Orders signed by MD have been provided to [facility].  The patient has been scheduled for 12:30pm today.  Additionally, the patient is requesting oral ondansetron be sent to the pharmacy.  Dr. Terrace ArabiaYan has authorized this prescription.

## 2017-11-14 ENCOUNTER — Other Ambulatory Visit: Payer: Self-pay | Admitting: Family Medicine

## 2017-11-15 ENCOUNTER — Other Ambulatory Visit: Payer: Self-pay | Admitting: Family Medicine

## 2017-11-15 DIAGNOSIS — R928 Other abnormal and inconclusive findings on diagnostic imaging of breast: Secondary | ICD-10-CM

## 2017-11-17 ENCOUNTER — Ambulatory Visit (INDEPENDENT_AMBULATORY_CARE_PROVIDER_SITE_OTHER): Payer: Managed Care, Other (non HMO) | Admitting: Family Medicine

## 2017-11-17 ENCOUNTER — Ambulatory Visit
Admission: RE | Admit: 2017-11-17 | Discharge: 2017-11-17 | Disposition: A | Discharge status: Home / Self Care | Payer: Managed Care, Other (non HMO) | Source: Ambulatory Visit | Attending: Family Medicine | Admitting: Family Medicine

## 2017-11-17 ENCOUNTER — Encounter: Payer: Self-pay | Admitting: Family Medicine

## 2017-11-17 VITALS — BP 126/77 | HR 100 | Resp 12 | Ht 59.0 in | Wt 255.2 lb

## 2017-11-17 DIAGNOSIS — F411 Generalized anxiety disorder: Secondary | ICD-10-CM

## 2017-11-17 DIAGNOSIS — R928 Other abnormal and inconclusive findings on diagnostic imaging of breast: Secondary | ICD-10-CM

## 2017-11-17 DIAGNOSIS — Z20818 Contact with and (suspected) exposure to other bacterial communicable diseases: Secondary | ICD-10-CM | POA: Diagnosis not present

## 2017-11-17 DIAGNOSIS — M25562 Pain in left knee: Secondary | ICD-10-CM | POA: Diagnosis not present

## 2017-11-17 DIAGNOSIS — G8929 Other chronic pain: Secondary | ICD-10-CM

## 2017-11-17 DIAGNOSIS — L989 Disorder of the skin and subcutaneous tissue, unspecified: Secondary | ICD-10-CM | POA: Diagnosis not present

## 2017-11-17 DIAGNOSIS — G4700 Insomnia, unspecified: Secondary | ICD-10-CM

## 2017-11-17 DIAGNOSIS — M5441 Lumbago with sciatica, right side: Secondary | ICD-10-CM | POA: Diagnosis not present

## 2017-11-17 LAB — POCT RAPID STREP A (OFFICE): Rapid Strep A Screen: NEGATIVE

## 2017-11-17 MED ORDER — ZOLPIDEM TARTRATE ER 6.25 MG PO TBCR: 6.2500 mg | EXTENDED_RELEASE_TABLET | Freq: Every evening | ORAL | 0 refills | Status: DC | PRN

## 2017-11-17 NOTE — Progress Notes (Addendum)
HPI:   [name] is a 37 y.o. female, who is here today to follow wounds on all chronic medical problems.  She was last seen on 10/04/2017.  Since her last OV she has followed with Dr. Terrace Byrd, neurologist.  She is also complaining of sore throat, rhinorrhea, nasal congestion for 2-3 days. Her husband was recently diagnosed with strep pharyngitis and her daughter is also having symptoms, she is going to take her to her pediatrician today. She has no noted fever or chills.  "Knot" in right temple area of her scalp, which she noted a month ago, it is tender with touch.  She denies history of trauma and she has no noted skin changes.  It is not growing. She has history of chronic headaches, she recently follow with neurologist and according to patient she was instructed to follow-up with PCP.   Insomnia: Last office visit Doxepin was recommended, she is currently on Doxepin 25 mg at bedtime, which has not helped with problem. In the past she has been on Trazodone at 150 mg at bedtime, Ambien 10 mg, neither one helped with sleep. OTC medication has not helped either.   Low back pain and left knee pain getting worse. History of chronic lumbar back pain, sharp, states that it is radiated to both lower extremities, she denies associated numbness or tingling, saddle anesthesia, or urine/bowel incontinence. She also reports history of "sciatic pain."  She states that the sciatic pain is a different problem, she does not feel it is related with her back, and it happens 1-2 times per month.  Shooting pain going down her RLE. She has daily pain, severe, exacerbated by any walking, standing, and bending. She was on Gabapentin for a few weeks, discontinued because she did not feel like it was helping.  Left knee pain has been going on for at least 6 months, is also getting worse. She denies any history of trauma. Pain is aggravated by going up and down stairs as well as prolonged  walking. She tried Celebrex, which did not help.  She is currently on Tramadol, which she uses for migraine headaches.  Anxiety: Last visit she was reporting worsening of symptoms, Sertraline 50 mg was started. She is telling me now that anxiety is "so so","a little."  She denies depressed mood or suicidal thoughts. In the past she has been evaluated by psychiatrist, about 4 years ago.    Review of Systems  Constitutional: Positive for fatigue. Negative for activity change, appetite change, chills and fever.  HENT: Positive for postnasal drip and sore throat. Negative for mouth sores, rhinorrhea and trouble swallowing.   Eyes: Negative for redness and visual disturbance.  Respiratory: Negative for cough, shortness of breath and wheezing.   Cardiovascular: Negative for leg swelling.  Gastrointestinal: Negative for abdominal pain, nausea and vomiting.       Negative for changes in bowel habits.  Endocrine: Negative for cold intolerance and heat intolerance.  Genitourinary: Negative for decreased urine volume, dysuria and hematuria.  Musculoskeletal: Positive for arthralgias and back pain. Negative for gait problem and joint swelling.  Skin: Negative for rash and wound.  Allergic/Immunologic: Positive for environmental allergies.  Neurological: Positive for headaches (Chronic and stable). Negative for syncope, weakness and numbness.  Hematological: Negative for adenopathy. Does not bruise/bleed easily.  Psychiatric/Behavioral: Positive for sleep disturbance. Negative for confusion and suicidal ideas. The patient is nervous/anxious.      Current Outpatient Medications on File Prior to Visit  Medication Sig Dispense Refill  . docusate sodium (COLACE) 100 MG capsule Take 1 capsule (100 mg total) by mouth 2 (two) times daily. 60 capsule 1  . Galcanezumab-gnlm (EMGALITY) 120 MG/ML SOAJ Inject 240 mg into the skin daily for 30 days, THEN 120 mg every 30 (thirty) days. 120mg  sq injection x2  at first month Then 120mg  sq every month. 2 pen 6  . omeprazole (PRILOSEC) 40 MG capsule TAKE ONE CAPSULE BY MOUTH EVERY DAY 90 capsule 1  . ondansetron (ZOFRAN) 4 MG tablet Take 1 tablet (4 mg total) by mouth every 8 (eight) hours as needed for nausea or vomiting. 20 tablet 0  . ranitidine (ZANTAC) 300 MG tablet Take 1 tablet (300 mg total) by mouth at bedtime. 90 tablet 1  . sertraline (ZOLOFT) 50 MG tablet Take 1 tablet (50 mg total) by mouth daily. 30 tablet 3  . tiZANidine (ZANAFLEX) 4 MG tablet TAKE 1 TABLET BY MOUTH 2 TIMES DAILY AS NEEDED FOR MUSCLE SPASMS 180 tablet 0  . traMADol (ULTRAM) 50 MG tablet Take 1 tablet (50 mg total) by mouth as needed. 30 tablet 5  . Vitamin D, Ergocalciferol, (DRISDOL) 50000 units CAPS capsule Take 1 capsule (50,000 Units total) by mouth every 7 (seven) days. 4 capsule 0   No current facility-administered medications on file prior to visit.      Past Medical History:  Diagnosis Date  . Allergy   . Anemia   . Anxiety   . Arthritis pain 08/20/2014  . Back pain   . GERD (gastroesophageal reflux disease)   . History of stress test 02/2011 ([facility])   there was no evidence of ischemia, but she only went 3 1/2 minutes on the treadmill making it very difficult to get a good accurate assessment, however  . Hx of echocardiogram    The echocadiogram was essentially normal with the exception of mild mitral calcification and borderline concentric LVH, which in the setting of her hypertension at this early age is something that mean her blood pressure is well controlled.   . Hypertension   . Iron deficiency anemia, unspecified 07/09/2014  . Migraines   . Palpitations   . Swelling   . Vitamin B12 deficiency 03/17/2016   Allergies  Allergen Reactions  . Fioricet [Butalbital-Apap-Caffeine] Other (See Comments)    GI upset  . Inderal [Propranolol] Nausea Only    Dropped BP too low    Social History   Socioeconomic History  . Marital status: Married    Spouse  name: Meredith Byrd  . Number of children: 4  . Years of education: Associates  . Highest education level: None  Social Needs  . Financial resource strain: None  . Food insecurity - worry: None  . Food insecurity - inability: None  . Transportation needs - medical: None  . Transportation needs - non-medical: None  Occupational History  . Occupation: Human resources officer  Tobacco Use  . Smoking status: Never Smoker  . Smokeless tobacco: Never Used  Substance and Sexual Activity  . Alcohol use: No  . Drug use: No  . Sexual activity: Yes  Other Topics Concern  . None  Social History Narrative   Right handed.   Lives at home with husband and four children.   No caffeine use.    Vitals:   11/17/17 1503  BP: 126/77  Pulse: 100  Resp: 12  SpO2: 100%   Body mass index is 51.55 kg/m.   Physical Exam  Nursing note and vitals reviewed.  Constitutional: She is oriented to person, place, and time. She appears well-developed. No distress.  HENT:  Head: Normocephalic and atraumatic.    Mouth/Throat: Oropharynx is clear and moist and mucous membranes are normal.  Eyes: Conjunctivae are normal. Pupils are equal, round, and reactive to light.  Cardiovascular: Normal rate and regular rhythm.  No murmur heard. Pulses:      Dorsalis pedis pulses are 2+ on the right side, and 2+ on the left side.  Respiratory: Effort normal and breath sounds normal. No respiratory distress.  GI: Soft. She exhibits no mass. There is no hepatomegaly. There is no tenderness.  Musculoskeletal: She exhibits tenderness. She exhibits no edema.       Right knee: She exhibits decreased range of motion. She exhibits no effusion. No tenderness found.       Left knee: She exhibits decreased range of motion. Tenderness found.       Lumbar back: She exhibits tenderness. She exhibits no bony tenderness.       Back:  Tenderness upon palpation of left lower thoracic and lumbar paraspinal muscle. Mild muscle spasm.     Lymphadenopathy:    She has no cervical adenopathy.  Neurological: She is alert and oriented to person, place, and time. She has normal strength. Gait normal.  SLR negative bilateral.  Skin: Skin is warm. No rash noted. No erythema.  Right temporal area,heairline, there is a mildly raised area,defined borders,tender with palpation,and mobile. About 1.5-2 cm. No skin changes. See HENT for details.  Psychiatric: Her mood appears anxious. Her affect is blunt.  Well groomed, good eye contact.      ASSESSMENT AND PLAN:   [name] was seen today for follow-up.  Orders Placed This Encounter  Procedures  . Culture, Group A Strep  . US Soft Tissue Head/Neck  . Ambulatory referral to Orthopedic Surgery  . POCT rapid strep A    Insomnia, unspecified type  After discussion of other pharmacologic treatment options as well as some side effects, she agrees with trying Ambien CR 6.25 mg at bedtime.Ambien dose can be increased to 12.5 mg if needed. Good sleep hygiene also recommended.  - zolpidem (AMBIEN CR) 6.25 MG CR tablet; Take 1 tablet (6.25 mg total) by mouth at bedtime as needed for sleep.  Dispense: 10 tablet; Refill: 0  Chronic low back pain with right-sided sciatica, unspecified back pain laterality  Celebrex and Zanaflex did not help. Recommend orthopedic evaluation. Weight loss might also help.  - Ambulatory referral to Orthopedic Surgery  Left knee pain, unspecified chronicity  Avoid activities that aggravate problem. Orthopedic referral placed.  - Ambulatory referral to Orthopedic Surgery  Lesion of skin of scalp  ? Lipoma vs sebaceous cyst. It seems a benign lesion. It is small, she would like further work-up, so soft tissue US will be arranged  - US Soft Tissue Head/Neck; Future  Exposure to strep throat  Rapid strep here in the office negative. We will follow culture. Symptomatic treatment recommended for now.  - POCT rapid strep A -  Culture, Group A Strep   Generalized anxiety disorder  No changes in Zoloft 50 mg daily. Follow-up in 6 weeks, before if needed.     -[name] was advised to return sooner than planned today if new concerns arise.       Darrell Leonhardt G. [place]Jordan, MD  [facility]. [place] office.

## 2017-11-17 NOTE — Patient Instructions (Signed)
A few things to remember from today's visit:   Exposure to strep throat - Plan: POCT rapid strep A, Culture, Group A Strep  Chronic low back pain with right-sided sciatica, unspecified back pain laterality - Plan: Ambulatory referral to Orthopedic Surgery  Left knee pain, unspecified chronicity - Plan: Ambulatory referral to Orthopedic Surgery  Ambien CR started today.  Let me know in 4-5 days if med helping.  Continue Zoloft.  Ortho referral placed.  Stop Celebrex and Doxepin.   Please be sure medication list is accurate. If a new problem present, please set up appointment sooner than planned today.

## 2017-11-19 ENCOUNTER — Encounter: Payer: Self-pay | Admitting: Family Medicine

## 2017-11-19 ENCOUNTER — Ambulatory Visit: Payer: Managed Care, Other (non HMO) | Admitting: Family Medicine

## 2017-11-19 LAB — CULTURE, GROUP A STREP
MICRO NUMBER:: 90160630
SPECIMEN QUALITY: ADEQUATE

## 2017-11-21 ENCOUNTER — Encounter: Payer: Self-pay | Admitting: Family Medicine

## 2017-11-22 ENCOUNTER — Telehealth: Payer: Self-pay | Admitting: Family Medicine

## 2017-11-22 NOTE — Telephone Encounter (Signed)
Copied from CRM [phone]. Topic: Quick Communication - See Telephone Encounter >> Nov 22, 2017 10:31 AM Eston Mouldavis, Abdul Beirne B wrote: CRM for notification. See Telephone encounter for:  PT states she needs  dosage increased current dosage not working   zolpidem (AMBIEN CR) 6.25 MG CR tablet   [id number] [place], [place] - [address] [phone] (Phone) [phone] (Fax)     11/22/17.

## 2017-11-23 NOTE — Telephone Encounter (Signed)
She can try 2 tabs at the same time, 12.5 mg, which is the max dose. Thanks, BJ

## 2017-11-23 NOTE — Telephone Encounter (Signed)
Spoke with patient and informed her of advice per Dr. [place] and patient verbalized understanding. New Rx sent to pharmacy.

## 2017-11-24 ENCOUNTER — Ambulatory Visit
Admission: RE | Admit: 2017-11-24 | Discharge: 2017-11-24 | Disposition: A | Discharge status: Home / Self Care | Payer: Managed Care, Other (non HMO) | Source: Ambulatory Visit | Attending: Family Medicine | Admitting: Family Medicine

## 2017-11-24 DIAGNOSIS — L989 Disorder of the skin and subcutaneous tissue, unspecified: Secondary | ICD-10-CM

## 2017-11-25 ENCOUNTER — Other Ambulatory Visit: Payer: Self-pay

## 2017-11-25 MED ORDER — ZOLPIDEM TARTRATE ER 12.5 MG PO TBCR: 12.5000 mg | EXTENDED_RELEASE_TABLET | Freq: Every evening | ORAL | 0 refills | Status: DC | PRN

## 2017-11-25 NOTE — Telephone Encounter (Signed)
Patient called and advised rx was not at pharmacy. Asked [facility] to advise pt that I would call in rx. [facility]. Nothing further needed.

## 2017-11-26 ENCOUNTER — Other Ambulatory Visit: Payer: Self-pay | Admitting: Family Medicine

## 2017-11-26 DIAGNOSIS — M25562 Pain in left knee: Secondary | ICD-10-CM

## 2017-11-26 DIAGNOSIS — M5441 Lumbago with sciatica, right side: Principal | ICD-10-CM

## 2017-11-26 DIAGNOSIS — G8929 Other chronic pain: Secondary | ICD-10-CM

## 2017-11-29 ENCOUNTER — Ambulatory Visit (HOSPITAL_COMMUNITY)
Admission: EM | Admit: 2017-11-29 | Discharge: 2017-11-29 | Disposition: A | Discharge status: Home / Self Care | Payer: Managed Care, Other (non HMO) | Attending: Family Medicine | Admitting: Family Medicine

## 2017-11-29 ENCOUNTER — Ambulatory Visit (INDEPENDENT_AMBULATORY_CARE_PROVIDER_SITE_OTHER): Payer: Managed Care, Other (non HMO) | Admitting: Orthopaedic Surgery

## 2017-11-29 ENCOUNTER — Encounter (HOSPITAL_COMMUNITY): Payer: Self-pay | Admitting: Emergency Medicine

## 2017-11-29 DIAGNOSIS — A084 Viral intestinal infection, unspecified: Secondary | ICD-10-CM

## 2017-11-29 MED ORDER — ONDANSETRON 8 MG PO TBDP: 8.0000 mg | ORAL_TABLET | Freq: Three times a day (TID) | ORAL | 0 refills | Status: DC | PRN

## 2017-11-29 MED ORDER — LOPERAMIDE HCL 2 MG PO CAPS: 2.0000 mg | ORAL_CAPSULE | Freq: Four times a day (QID) | ORAL | 0 refills | Status: DC | PRN

## 2017-11-29 NOTE — ED Triage Notes (Signed)
PT reports nausea, diarrhea, chills, body aches, and headache that started last night.   10 episodes of diarrhea since onset

## 2017-11-29 NOTE — Discharge Instructions (Signed)
If symptoms worsen, return for recheck.  Try to continue sips of clear liquids and water based soups to avoid dehydration

## 2017-11-29 NOTE — ED Provider Notes (Signed)
[facility]   [id number] 11/29/17 Arrival Time: 1435   SUBJECTIVE:  Meredith Byrd is a 37 y.o. female who presents to the urgent care with complaint of nausea, diarrhea, chills, body aches, and headache that started last night.   10 episodes of diarrhea since onset  Past Medical History:  Diagnosis Date  . Allergy   . Anemia   . Anxiety   . Arthritis pain 08/20/2014  . Back pain   . GERD (gastroesophageal reflux disease)   . History of stress test 02/2011 ([facility])   there was no evidence of ischemia, but she only went 3 1/2 minutes on the treadmill making it very difficult to get a good accurate assessment, however  . Hx of echocardiogram    The echocadiogram was essentially normal with the exception of mild mitral calcification and borderline concentric LVH, which in the setting of her hypertension at this early age is something that mean her blood pressure is well controlled.   . Hypertension   . Iron deficiency anemia, unspecified 07/09/2014  . Migraines   . Palpitations   . Swelling   . Vitamin B12 deficiency 03/17/2016   Family History  Problem Relation Age of Onset  . Stroke Maternal Grandmother   . Heart disease Maternal Grandmother   . Hypertension Maternal Grandmother   . Hyperlipidemia Maternal Grandmother   . Asthma Mother   . Heart disease Mother   . Diabetes Mother   . Early death Mother   . Hypertension Mother   . Thyroid disease Mother   . Anxiety disorder Mother   . Hypertension Father   . Heart disease Father    Social History   Socioeconomic History  . Marital status: Married    Spouse name: Damon  . Number of children: 4  . Years of education: Associates  . Highest education level: Not on file  Social Needs  . Financial resource strain: Not on file  . Food insecurity - worry: Not on file  . Food insecurity - inability: Not on file  . Transportation needs - medical: Not on file  . Transportation needs - non-medical: Not on file   Occupational History  . 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 Smoking status: Never Smoker  . Smokeless tobacco: Never Used  Substance and Sexual Activity  . Alcohol use: No  . Drug use: No  . Sexual activity: Yes  Other Topics Concern  . Not on file  Social History Narrative   Right handed.   Lives at home with husband and four children.   No caffeine use.   Current Meds  Medication Sig  . Galcanezumab-gnlm (EMGALITY) 120 MG/ML SOAJ Inject 240 mg into the skin daily for 30 days, THEN 120 mg every 30 (thirty) days. 120mg  sq injection x2 at first month Then 120mg  sq every month.  [name]. omeprazole (PRILOSEC) 40 MG capsule TAKE ONE CAPSULE BY MOUTH EVERY DAY  . [DISCONTINUED] ondansetron (ZOFRAN) 4 MG tablet Take 1 tablet (4 mg total) by mouth every 8 (eight) hours as needed for nausea or vomiting.   Allergies  Allergen Reactions  . Fioricet [Butalbital-Apap-Caffeine] Other (See Comments)    GI upset  . Inderal [Propranolol] Nausea Only    Dropped BP too low      ROS: As per HPI, remainder of ROS negative.   OBJECTIVE:   Vitals:   11/29/17 1556 11/29/17 1557 11/29/17 1558  BP:   112/85  Pulse: (!) 102    Resp: 18  Temp: 98.7 F (37.1 C)    TempSrc: Temporal    SpO2: 97%    Weight:  250 lb (113.4 kg)   Height:  4\' 11"  (1.499 m)      General appearance: alert; no distress Eyes: PERRL; EOMI; conjunctiva normal HENT: normocephalic; atraumatic; ; oral mucosa normal Neck: supple Lungs: clear to auscultation bilaterally Heart: regular rate and rhythm Abdomen: soft, non-tender; bowel sounds normal; no masses or organomegaly; no guarding or rebound tenderness Back: no CVA tenderness Extremities: no cyanosis or edema; symmetrical with no gross deformities Skin: warm and dry Neurologic: normal gait; grossly normal Psychological: alert and cooperative; normal mood and affect      Labs:  Results for orders placed or performed in visit on 11/17/17    Culture, Group A Strep  Result Value Ref Range   MICRO NUMBER: [id number]    SPECIMEN QUALITY: ADEQUATE    SOURCE: THROAT    STATUS: FINAL    RESULT: No group A Streptococcus isolated   POCT rapid strep A  Result Value Ref Range   Rapid Strep A Screen Negative Negative    Labs Reviewed - No data to display  No results found.     ASSESSMENT & PLAN:  1. Viral gastroenteritis     Meds ordered this encounter  Medications  . loperamide (IMODIUM) 2 MG capsule    Sig: Take 1 capsule (2 mg total) by mouth 4 (four) times daily as needed for diarrhea or loose stools.    Dispense:  12 capsule    Refill:  0  . ondansetron (ZOFRAN-ODT) 8 MG disintegrating tablet    Sig: Take 1 tablet (8 mg total) by mouth every 8 (eight) hours as needed for nausea.    Dispense:  12 tablet    Refill:  0    Reviewed expectations re: course of current medical issues. Questions answered. Outlined signs and symptoms indicating need for more acute intervention. Patient verbalized understanding. After Visit Summary given.    Procedures:      Elvina Sidle, MD 11/29/17 [phone]

## 2017-11-30 ENCOUNTER — Encounter: Payer: Self-pay | Admitting: Family Medicine

## 2017-12-06 ENCOUNTER — Telehealth: Payer: Self-pay | Admitting: *Deleted

## 2017-12-06 NOTE — Telephone Encounter (Signed)
Requesting script for Zoloft HCL 50 MG tablet #90

## 2017-12-07 ENCOUNTER — Other Ambulatory Visit: Payer: Self-pay | Admitting: Family Medicine

## 2017-12-07 DIAGNOSIS — F411 Generalized anxiety disorder: Secondary | ICD-10-CM

## 2017-12-07 MED ORDER — SERTRALINE HCL 50 MG PO TABS: 50.0000 mg | ORAL_TABLET | Freq: Every day | ORAL | 0 refills | Status: DC

## 2017-12-07 NOTE — Telephone Encounter (Signed)
Spoke with patient and informed her that the Rx was sent to pharmacy.

## 2017-12-07 NOTE — Telephone Encounter (Signed)
Pt checking on status of refill on zoloft. States she requested it last week

## 2017-12-07 NOTE — Telephone Encounter (Signed)
Pt called in to follow up on refill request. Pt says that she has been out of her medication for a week now. Pt is requesting a 90 day supply. Pt states that the pharmacy has sent over several request. Only showing 1. Did advise pt that request has been sent to provider and we are currently awaiting her approval.

## 2017-12-07 NOTE — Telephone Encounter (Signed)
Rx for Zoloft 50 mg was sent to her pharmacy. Thanks, BJ

## 2017-12-10 ENCOUNTER — Telehealth: Payer: Self-pay | Admitting: Family Medicine

## 2017-12-10 ENCOUNTER — Encounter: Payer: Self-pay | Admitting: Family Medicine

## 2017-12-10 ENCOUNTER — Ambulatory Visit (INDEPENDENT_AMBULATORY_CARE_PROVIDER_SITE_OTHER): Payer: Managed Care, Other (non HMO) | Admitting: Family Medicine

## 2017-12-10 VITALS — BP 122/76 | HR 91 | Temp 98.6°F | Resp 12 | Ht 59.0 in | Wt 250.5 lb

## 2017-12-10 DIAGNOSIS — F411 Generalized anxiety disorder: Secondary | ICD-10-CM

## 2017-12-10 DIAGNOSIS — G4700 Insomnia, unspecified: Secondary | ICD-10-CM

## 2017-12-10 MED ORDER — TEMAZEPAM 30 MG PO CAPS
30.0000 mg | ORAL_CAPSULE | Freq: Every evening | ORAL | 0 refills | Status: DC | PRN
Start: 2017-12-10 — End: 2017-12-22

## 2017-12-10 NOTE — Assessment & Plan Note (Signed)
She is reporting some improvement. She does not think medication needsd to be adjusted, so she will continue Zoloft 50 mg daily. Follow-up in 2 months.

## 2017-12-10 NOTE — Telephone Encounter (Signed)
Copied from CRM [id number]. Topic: Quick Communication - See Telephone Encounter >> Dec 10, 2017  8:59 AM Oneal GroutSebastian, Jennifer S wrote: CRM for notification. See Telephone encounter for: patient calling and stating that zolpidem (AMBIEN CR) 12.5 MG CR tablet is not working, would like something else called in. [facility] [address] Patient states she called yesterday, I do not see any record of that. Would like call back asap.  12/10/17.

## 2017-12-10 NOTE — Telephone Encounter (Signed)
Spoke with patient, patient coming in to discuss medication with PCP on 12/10/17.

## 2017-12-10 NOTE — Patient Instructions (Addendum)
A few things to remember from today's visit:   Insomnia, unspecified type - Plan: temazepam (RESTORIL) 30 MG capsule  Generalized anxiety disorder  Insomnia Insomnia is a sleep disorder that makes it difficult to fall asleep or to stay asleep. Insomnia can cause tiredness (fatigue), low energy, difficulty concentrating, mood swings, and poor performance at work or school. There are three different ways to classify insomnia:  Difficulty falling asleep.  Difficulty staying asleep.  Waking up too early in the morning.  Any type of insomnia can be long-term (chronic) or short-term (acute). Both are common. Short-term insomnia usually lasts for three months or less. Chronic insomnia occurs at least three times a week for longer than three months. What are the causes? Insomnia may be caused by another condition, situation, or substance, such as:  Anxiety.  Certain medicines.  Gastroesophageal reflux disease (GERD) or other gastrointestinal conditions.  Asthma or other breathing conditions.  Restless legs syndrome, sleep apnea, or other sleep disorders.  Chronic pain.  Menopause. This may include hot flashes.  Stroke.  Abuse of alcohol, tobacco, or illegal drugs.  Depression.  Caffeine.  Neurological disorders, such as Alzheimer disease.  An overactive thyroid (hyperthyroidism).  The cause of insomnia may not be known. What increases the risk? Risk factors for insomnia include:  Gender. Women are more commonly affected than men.  Age. Insomnia is more common as you get older.  Stress. This may involve your professional or personal life.  Income. Insomnia is more common in people with lower income.  Lack of exercise.  Irregular work schedule or night shifts.  Traveling between different time zones.  What are the signs or symptoms? If you have insomnia, trouble falling asleep or trouble staying asleep is the main symptom. This may lead to other symptoms, such  as:  Feeling fatigued.  Feeling nervous about going to sleep.  Not feeling rested in the morning.  Having trouble concentrating.  Feeling irritable, anxious, or depressed.  How is this treated? Treatment for insomnia depends on the cause. If your insomnia is caused by an underlying condition, treatment will focus on addressing the condition. Treatment may also include:  Medicines to help you sleep.  Counseling or therapy.  Lifestyle adjustments.  Follow these instructions at home:  Take medicines only as directed by your health care provider.  Keep regular sleeping and waking hours. Avoid naps.  Keep a sleep diary to help you and your health care provider figure out what could be causing your insomnia. Include: ? When you sleep. ? When you wake up during the night. ? How well you sleep. ? How rested you feel the next day. ? Any side effects of medicines you are taking. ? What you eat and drink.  Make your bedroom a comfortable place where it is easy to fall asleep: ? Put up shades or special blackout curtains to block light from outside. ? Use a white noise machine to block noise. ? Keep the temperature cool.  Exercise regularly as directed by your health care provider. Avoid exercising right before bedtime.  Use relaxation techniques to manage stress. Ask your health care provider to suggest some techniques that may work well for you. These may include: ? Breathing exercises. ? Routines to release muscle tension. ? Visualizing peaceful scenes.  Cut back on alcohol, caffeinated beverages, and cigarettes, especially close to bedtime. These can disrupt your sleep.  Do not overeat or eat spicy foods right before bedtime. This can lead to digestive discomfort that  can make it hard for you to sleep.  Limit screen use before bedtime. This includes: ? Watching TV. ? Using your smartphone, tablet, and computer.  Stick to a routine. This can help you fall asleep faster.  Try to do a quiet activity, brush your teeth, and go to bed at the same time each night.  Get out of bed if you are still awake after 15 minutes of trying to sleep. Keep the lights down, but try reading or doing a quiet activity. When you feel sleepy, go back to bed.  Make sure that you drive carefully. Avoid driving if you feel very sleepy.  Keep all follow-up appointments as directed by your health care provider. This is important. Contact a health care provider if:  You are tired throughout the day or have trouble in your daily routine due to sleepiness.  You continue to have sleep problems or your sleep problems get worse. Get help right away if:  You have serious thoughts about hurting yourself or someone else. This information is not intended to replace advice given to you by your health care provider. Make sure you discuss any questions you have with your health care provider. Document Released: 09/25/2000 Document Revised: 02/28/2016 Document Reviewed: 06/29/2014 Elsevier Interactive Patient Education  2018 [employer] Please be sure medication list is accurate. If a new problem present, please set up appointment sooner than planned today.

## 2017-12-10 NOTE — Telephone Encounter (Signed)
Patient aware that she need an OV to discuss medication changes. Patient called [facility] and rescheduled for 12/17/2017.

## 2017-12-10 NOTE — Assessment & Plan Note (Signed)
Problem is not well controlled, we had tried different pharmacologic treatments unsuccessfully. We discussed possible causes, including underlying psychiatric disorders as bipolar and depression. She is not interested in adding Seroquel to Ambien CR. We discussed other pharmacologic treatments for insomnia, she would like to try Temazepam. Temazepam 30 mg recommended, given the fact she has already failed other medications we decided to start with the max dose. She understands side effects. Continue good sleep hygiene. I explained that at some point if we cannot find a medication to better control problem we may need evaluation by sleep specialist. She will let me know if temazepam 30 mg helps, I will see her back in 2 months.

## 2017-12-10 NOTE — Progress Notes (Signed)
HPI:   Meredith Byrd is a 37 y.o. female, who is here today for follow up on insomnia and anxiety.  She was last seen on 11/17/17.  Since her last OV she has been seen in the ED for viral gastroenteritis.  She has been on Trazodone, Doxepin, Ambien, and Ambien CR for insomnia.  She is currently on Ambien CR 12.5 mg daily, which is not helping at all.  She is reporting sleeping max about 1.5-2 hours and sometimes she cannot sleep at all. She denies history of bipolar disorder. She reports being on Seroquel in the past.  History of migraine headaches, she follows with neurologist.   She is on Zoloft 50 mg for anxiety, which she states is helping "kind of." She denies depressed mood or suicidal thoughts.  She denies side effects from medications.    Lab Results  Component Value Date   TSH 0.708 06/29/2017   Sleep study done in 08/2017, it did not show significant of OSA. She states that she is having a good sleep hygiene as has been recommended.  Hx of chronic back and knee pain, she is currently following with orthopedist.   Review of Systems  Constitutional: Positive for fatigue. Negative for activity change and appetite change.  Respiratory: Negative for shortness of breath and wheezing.   Cardiovascular: Negative for chest pain and palpitations.  Gastrointestinal: Negative for abdominal pain, nausea and vomiting.       No changes in bowel habits.  Endocrine: Negative for cold intolerance and heat intolerance.  Musculoskeletal: Positive for arthralgias and back pain. Negative for gait problem.  Allergic/Immunologic: Positive for environmental allergies.  Neurological: Positive for headaches. Negative for tremors, syncope and weakness.  Psychiatric/Behavioral: Positive for sleep disturbance. Negative for agitation, confusion, hallucinations and suicidal ideas. The patient is nervous/anxious.       Current Outpatient Medications on File Prior to Visit   Medication Sig Dispense Refill  . Galcanezumab-gnlm (EMGALITY) 120 MG/ML SOAJ Inject 240 mg into the skin daily for 30 days, THEN 120 mg every 30 (thirty) days. 120mg  sq injection x2 at first month Then 120mg  sq every month. 2 pen 6  . loperamide (IMODIUM) 2 MG capsule Take 1 capsule (2 mg total) by mouth 4 (four) times daily as needed for diarrhea or loose stools. 12 capsule 0  . omeprazole (PRILOSEC) 40 MG capsule TAKE ONE CAPSULE BY MOUTH EVERY DAY 90 capsule 1  . ondansetron (ZOFRAN-ODT) 8 MG disintegrating tablet Take 1 tablet (8 mg total) by mouth every 8 (eight) hours as needed for nausea. 12 tablet 0  . ranitidine (ZANTAC) 300 MG tablet Take 1 tablet (300 mg total) by mouth at bedtime. 90 tablet 1  . sertraline (ZOLOFT) 50 MG tablet Take 1 tablet (50 mg total) by mouth daily. 90 tablet 0  . tiZANidine (ZANAFLEX) 4 MG tablet TAKE 1 TABLET BY MOUTH 2 TIMES DAILY AS NEEDED FOR MUSCLE SPASMS 180 tablet 0  . Vitamin D, Ergocalciferol, (DRISDOL) 50000 units CAPS capsule Take 1 capsule (50,000 Units total) by mouth every 7 (seven) days. 4 capsule 0   No current facility-administered medications on file prior to visit.      Past Medical History:  Diagnosis Date  . Allergy   . Anemia   . Anxiety   . Arthritis pain 08/20/2014  . Back pain   . GERD (gastroesophageal reflux disease)   . History of stress test 02/2011 ([facility])   there was no evidence of ischemia,  but she only went 3 1/2 minutes on the treadmill making it very difficult to get a good accurate assessment, however  . Hx of echocardiogram    The echocadiogram was essentially normal with the exception of mild mitral calcification and borderline concentric LVH, which in the setting of her hypertension at this early age is something that mean her blood pressure is well controlled.   . Hypertension   . Iron deficiency anemia, unspecified 07/09/2014  . Migraines   . Palpitations   . Swelling   . Vitamin B12 deficiency 03/17/2016    Allergies  Allergen Reactions  . Fioricet [Butalbital-Apap-Caffeine] Other (See Comments)    GI upset  . Inderal [Propranolol] Nausea Only    Dropped BP too low    Social History   Socioeconomic History  . Marital status: Married    Spouse name: Damon  . Number of children: 4  . Years of education: Associates  . Highest education level: None  Social Needs  . Financial resource strain: None  . Food insecurity - worry: None  . Food insecurity - inability: None  . Transportation needs - medical: None  . Transportation needs - non-medical: None  Occupational History  . Occupation: Human resources officer  Tobacco Use  . Smoking status: Never Smoker  . Smokeless tobacco: Never Used  Substance and Sexual Activity  . Alcohol use: No  . Drug use: No  . Sexual activity: Yes  Other Topics Concern  . None  Social History Narrative   Right handed.   Lives at home with husband and four children.   No caffeine use.    Vitals:   12/10/17 1524  BP: 122/76  Pulse: 91  Resp: 12  Temp: 98.6 F (37 C)  SpO2: 100%   Body mass index is 50.59 kg/m.   Physical Exam  Nursing note and vitals reviewed. Constitutional: She is oriented to person, place, and time. She appears well-developed. No distress.  HENT:  Head: Normocephalic and atraumatic.  Mouth/Throat: Oropharynx is clear and moist and mucous membranes are normal.  Eyes: Conjunctivae are normal.  Cardiovascular: Normal rate and regular rhythm.  No murmur heard. Respiratory: Effort normal and breath sounds normal. No respiratory distress.  Musculoskeletal: She exhibits no edema.  Neurological: She is alert and oriented to person, place, and time. She has normal strength. Gait normal.  Skin: Skin is warm. No erythema.  Psychiatric: Her mood appears anxious. Her affect is blunt. Cognition and memory are normal. She expresses no suicidal ideation. She expresses no suicidal plans.  [place], well-groomed, good eye contact.      ASSESSMENT AND PLAN:   [name] was seen today for follow-up.  No orders of the defined types were placed in this encounter.   Insomnia Problem is not well controlled, we had tried different pharmacologic treatments unsuccessfully. We discussed possible causes, including underlying psychiatric disorders as bipolar and depression. She is not interested in adding Seroquel to Ambien CR. We discussed other pharmacologic treatments for insomnia, she would like to try Temazepam. Temazepam 30 mg recommended, given the fact she has already failed other medications we decided to start with the max dose. She understands side effects. Continue good sleep hygiene. I explained that at some point if we cannot find a medication to better control problem we may need evaluation by sleep specialist. She will let me know if temazepam 30 mg helps, I will see her back in 2 months.  Generalized anxiety disorder She is reporting some  improvement. She does not think medication needsd to be adjusted, so she will continue Zoloft 50 mg daily. Follow-up in 2 months.      -Ms. Alyzza TORII ROYSE was advised to return sooner than planned today if new concerns arise.       Haddon Fyfe G. [place], MD  [facility]. [place] office.

## 2017-12-13 ENCOUNTER — Other Ambulatory Visit: Payer: Self-pay | Admitting: Family Medicine

## 2017-12-16 ENCOUNTER — Other Ambulatory Visit: Payer: Self-pay | Admitting: Family Medicine

## 2017-12-16 DIAGNOSIS — G4700 Insomnia, unspecified: Secondary | ICD-10-CM

## 2017-12-17 ENCOUNTER — Ambulatory Visit: Payer: Managed Care, Other (non HMO) | Admitting: Family Medicine

## 2017-12-17 DIAGNOSIS — Z0289 Encounter for other administrative examinations: Secondary | ICD-10-CM

## 2017-12-22 ENCOUNTER — Other Ambulatory Visit: Payer: Self-pay | Admitting: Family Medicine

## 2017-12-22 DIAGNOSIS — G4700 Insomnia, unspecified: Secondary | ICD-10-CM

## 2017-12-29 ENCOUNTER — Telehealth: Payer: Self-pay | Admitting: *Deleted

## 2017-12-29 NOTE — Telephone Encounter (Signed)
Pt [employer] form on [employer] desk.

## 2017-12-31 ENCOUNTER — Encounter: Payer: Self-pay | Admitting: *Deleted

## 2017-12-31 ENCOUNTER — Encounter: Payer: Self-pay | Admitting: Neurology

## 2018-01-11 DIAGNOSIS — Z0289 Encounter for other administrative examinations: Secondary | ICD-10-CM

## 2018-01-12 ENCOUNTER — Other Ambulatory Visit: Payer: Self-pay | Admitting: Family Medicine

## 2018-01-12 DIAGNOSIS — K219 Gastro-esophageal reflux disease without esophagitis: Secondary | ICD-10-CM

## 2018-01-13 ENCOUNTER — Telehealth: Payer: Self-pay | Admitting: Neurology

## 2018-01-13 NOTE — Telephone Encounter (Signed)
Spoke to patient and she requested the following changes to be made to her workplace accommodations request form:  1) add a statement that her migraines are a lifelong condition 2) add a statement that she can work at home  We have added that migraines are a lifelong condition.  Dr. Terrace ArabiaYan has initialed this statement and the updated form has been faxed and confirmed to [facility] - Fax [phone].  Dr. Terrace ArabiaYan has stated on the previous form that during an acute migraine, patient may need to work in a dimly lit, quiet environment.  She is aware that Dr. Terrace ArabiaYan is not going to determine this environment to be at home.  This environment will need to be worked our with her employer.  Patient is aware.  The updated paperwork has been sent to scanning.

## 2018-01-13 NOTE — Telephone Encounter (Signed)
Pt is calling about the work accomodation form. She has a few questions to discuss with RN. Please call

## 2018-01-25 ENCOUNTER — Other Ambulatory Visit: Payer: Self-pay | Admitting: Family Medicine

## 2018-01-26 DIAGNOSIS — M549 Dorsalgia, unspecified: Secondary | ICD-10-CM | POA: Insufficient documentation

## 2018-02-02 ENCOUNTER — Other Ambulatory Visit: Payer: Self-pay | Admitting: Family Medicine

## 2018-02-27 ENCOUNTER — Other Ambulatory Visit: Payer: Self-pay | Admitting: Family Medicine

## 2018-02-27 DIAGNOSIS — F411 Generalized anxiety disorder: Secondary | ICD-10-CM

## 2018-02-28 ENCOUNTER — Ambulatory Visit (INDEPENDENT_AMBULATORY_CARE_PROVIDER_SITE_OTHER): Payer: Managed Care, Other (non HMO) | Admitting: Neurology

## 2018-02-28 ENCOUNTER — Encounter: Payer: Self-pay | Admitting: Neurology

## 2018-02-28 ENCOUNTER — Telehealth: Payer: Self-pay | Admitting: Neurology

## 2018-02-28 VITALS — BP 121/76 | HR 82 | Ht 59.0 in | Wt 253.0 lb

## 2018-02-28 DIAGNOSIS — G43109 Migraine with aura, not intractable, without status migrainosus: Secondary | ICD-10-CM

## 2018-02-28 MED ORDER — ZONISAMIDE 100 MG PO CAPS
100.0000 mg | ORAL_CAPSULE | Freq: Two times a day (BID) | ORAL | 11 refills | Status: DC
Start: 2018-02-28 — End: 2019-05-17

## 2018-02-28 MED ORDER — DICLOFENAC POTASSIUM(MIGRAINE) 50 MG PO PACK: 50.0000 mg | PACK | ORAL | 11 refills | Status: DC | PRN

## 2018-02-28 MED ORDER — TRAMADOL HCL 50 MG PO TABS: 50.0000 mg | ORAL_TABLET | ORAL | 5 refills | Status: DC | PRN

## 2018-02-28 NOTE — Telephone Encounter (Signed)
Patient was made aware at check-out of her bad debt balance. Patient was also made aware that she is unable to schedule her follow-up until she makes payments toward her balance. Patient states she will call our billing to make sure payments are being made toward her balance. She also states she will also make her follow-up appt when she calls billing.

## 2018-02-28 NOTE — Progress Notes (Signed)
[facility]  PATIENT: Meredith Byrd DOB: May 28, 1981  HISTORY OF PRESENT ILLNESS: Meredith M Deandradeis 37 years old right-handed African-American female, is referred by her primary care physician nurse practitioner Marva Panda for evaluation of migraine headaches.Initial evaluation was in February 2016.  She had long-standing history of migraine, since elementary school, her typical migraine are lateralized severe pounding headache with associated light noise sensitivity, nauseous, can last for few days, even after few weeks. She had a history of gastric bypass, lost 60 pounds in 2008, her weight has been stable  I saw her previously, while taking Topamax, her headache is much less frequent, less severe, but she has not take any preventive medications since 2014.  She now complains of increased headache frequency, since 2014, she is having 3-4 typical migraine headaches each week, each episode last for a few days, in January, she has one severe episode of migraine lasting for 2 weeks. She was not able to identify triggers, she has tried triptan treatment in the past, Imitrex, cause heart racing, tightness, Maxalt, similar side effect, without helping her headache much.  Over the past 18 months, she was given Stadol nasal spray for headaches, she gets 3 bottles of 10 mg Stadol nasal spray each month, this was prescribed by her previous primary care physician [facility], but always has recurrent headaches shortly afterwards.  UPDATE March 24th 2016: She reported only temporary partial relief with previous nerve block in November 23 2014, she had tried Zomig nasal spray 10 times, without helping her headaches, Previously Preventive medications, Topamax works for her headaches, but she is trying to conceiving again, does not want to take too much medications, I have went over other possibilities including Inderal, nortriptyline, all belongs to  category C, I also advised her avoiding triptan, and Stadol treatment during pregnancy,  She had 4 children, no significant headache during her previous pregnancy  UPDATE Feb 09 2017: She continue complains of frequent migraines about 10-15 days each month, sometimes preceded by visual distortion, usually short lasting, but sometimes she woke up with severe migraine headache, last visit was in March 2016, over the past 2 years, she was managed by her primary care physician, per patient, she was given prescription of Stadol, tramadol, but we were not able to find her refill information from [facility].  She states she has tried and failed multiple preventative medications, Topamax,cause too much memory issues, almost lost her job,  For abortive treatment she has tried Maxalt, Zomig, sumatriptan, none of above works for her, some of the medication even made it more intense.   UPDATE Oct 28 2017: She continue have 15-20 migraine headaches days each month, she has tried different preventive medications, Topamax, nortriptyline, Inderal, without helping her headaches,  She has suboptimal response to triptan treatment, imitrex, zomig, relpax, maxalt. taking tramadol 50 mg 2 tablets as needed for migraine,  We started CGRP antagonist Emglaity today,  UPDATE Feb 28 2018 she reported decreased She has started [employer] since January 2019, she reproted decreased severity of migraine headaches from 15/10 to 9/10, she still has 17 migraine headaches days each month,  I have offered her options of adding on preventive medications, she does not want to try Effexor, has tried calcium channel blocker, Botox injection  She owns balance to headache [facility] center, does not want to go to [facility] or [place] for second opinion,   REVIEW OF SYSTEMS: Full 14 system review of systems performed and notable only for  those listed, all others are neg:  As above   ALLERGIES: Allergies  Allergen  Reactions  . Fioricet [Butalbital-Apap-Caffeine] Other (See Comments)    GI upset  . Inderal [Propranolol] Nausea Only    Dropped BP too low    HOME MEDICATIONS: Outpatient Medications Prior to Visit  Medication Sig Dispense Refill  . Galcanezumab-gnlm (EMGALITY) 120 MG/ML SOAJ Inject 240 mg into the skin daily for 30 days, THEN 120 mg every 30 (thirty) days.  sq injection x2 at first month Then  sq every month. 2 pen 6  . omeprazole (PRILOSEC) 40 MG capsule TAKE 1 CAPSULE BY MOUTH EVERY DAY 90 capsule 1  . sertraline (ZOLOFT) 50 MG tablet Take 1 tablet (50 mg total) by mouth daily. 90 tablet 0  . temazepam (RESTORIL) 30 MG capsule TAKE 1 CAPSULE BY MOUTH AT BEDTIME AS NEEDED FOR Byrd. 30 capsule 1  . tiZANidine (ZANAFLEX) 4 MG tablet TAKE 1 TABLET BY MOUTH TWICE A DAY AS NEEDED FOR MUSCLE SPASMS 180 tablet 0  . traMADol (ULTRAM) 50 MG tablet Take 50 mg by mouth daily as needed.    . Vitamin D, Ergocalciferol, (DRISDOL) 50000 units CAPS capsule Take 1 capsule (50,000 Units total) by mouth every 7 (seven) days. 4 capsule 0  . loperamide (IMODIUM) 2 MG capsule Take 1 capsule (2 mg total) by mouth 4 (four) times daily as needed for diarrhea or loose stools. 12 capsule 0  . ondansetron (ZOFRAN-ODT) 8 MG disintegrating tablet Take 1 tablet (8 mg total) by mouth every 8 (eight) hours as needed for nausea. 12 tablet 0  . ranitidine (ZANTAC) 300 MG tablet TAKE 1 TABLET BY MOUTH EVERYDAY AT BEDTIME 90 tablet 1   No facility-administered medications prior to visit.     PAST MEDICAL HISTORY: Past Medical History:  Diagnosis Date  . Allergy   . Anemia   . Anxiety   . Arthritis pain 08/20/2014  . Back pain   . GERD (gastroesophageal reflux disease)   . History of stress test 02/2011 ([facility])   there was no evidence of ischemia, but she only went 3 1/2 minutes on the treadmill making it very difficult to get a good accurate assessment, however  . Hx of echocardiogram    The echocadiogram  was essentially normal with the exception of mild mitral calcification and borderline concentric LVH, which in the setting of her hypertension at this early age is something that mean her blood pressure is well controlled.   . Hypertension   . Iron deficiency anemia, unspecified 07/09/2014  . Migraines   . Palpitations   . Swelling   . Vitamin B12 deficiency 03/17/2016    PAST SURGICAL HISTORY: Past Surgical History:  Procedure Laterality Date  . BREAST SURGERY    . CHOLECYSTECTOMY    . GASTRIC BYPASS  2008  . OOPHORECTOMY Right 2010    FAMILY HISTORY: Family History  Problem Relation Age of Onset  . Stroke Maternal Grandmother   . Heart disease Maternal Grandmother   . Hypertension Maternal Grandmother   . Hyperlipidemia Maternal Grandmother   . Asthma Mother   . Heart disease Mother   . Diabetes Mother   . Early death Mother   . Hypertension Mother   . Thyroid disease Mother   . Anxiety disorder Mother   . Hypertension Father   . Heart disease Father     SOCIAL HISTORY: Social History   Socioeconomic History  . Marital status: Married    Spouse name: Damon  .  Number of children: 4  . Years of education: Associates  . Highest education level: Not on file  Occupational History  . Occupation: Human resources officer  Social Needs  . Financial resource strain: Not on file  . Food insecurity:    Worry: Not on file    Inability: Not on file  . Transportation needs:    Medical: Not on file    Non-medical: Not on file  Tobacco Use  . Smoking status: Never Smoker  . Smokeless tobacco: Never Used  Substance and Sexual Activity  . Alcohol use: No  . Drug use: No  . Sexual activity: Yes  Lifestyle  . Physical activity:    Days per week: Not on file    Minutes per session: Not on file  . Stress: Not on file  Relationships  . Social connections:    Talks on phone: Not on file    Gets together: Not on file    Attends religious service: Not on file    Active member of  club or organization: Not on file    Attends meetings of clubs or organizations: Not on file    Relationship status: Not on file  . Intimate partner violence:    Fear of current or ex partner: Not on file    Emotionally abused: Not on file    Physically abused: Not on file    Forced sexual activity: Not on file  Other Topics Concern  . Not on file  Social History Narrative   Right handed.   Lives at home with husband and four children.   No caffeine use.     PHYSICAL EXAM  Vitals:   02/28/18 1603  BP: 121/76  Pulse: 82  Weight: 253 lb (114.8 kg)  Height:  (1.499 m)   Body mass index is 51.1 kg/m.  Generalized: Well developed, in no acute distress  Head: normocephalic and atraumatic,. Oropharynx benign  Neck: Supple, no carotid bruits  Cardiac: Regular rate rhythm, no murmur  Musculoskeletal: No deformity   Neurological examination   Mentation: Alert oriented to time, place, history taking. Attention span and concentration appropriate. Recent and remote memory intact.  Follows all commands speech and language fluent.   Cranial nerve II-XII: Pupils were equal round reactive to light extraocular movements were full, visual field were full on confrontational test. Facial sensation and strength were normal. hearing was intact to finger rubbing bilaterally. Uvula tongue midline. head turning and shoulder shrug were normal and symmetric.Tongue protrusion into cheek strength was normal. Motor: normal bulk and tone, full strength in the BUE, BLE, fine finger movements normal, no pronator drift. No focal weakness Sensory: normal and symmetric to light touch, pinprick, and  Vibration,in the upper and lower extremities  Coordination: finger-nose-finger, heel-to-shin bilaterally, no dysmetria Reflexes: symmetric upper and lower plantar responses were flexor bilaterally. Gait and Station: Rising up from seated position without assistance, normal stance,  moderate stride, good arm  swing, smooth turning, able to perform tiptoe, and heel walking without difficulty. Tandem gait is steady  DIAGNOSTIC DATA (LABS, IMAGING, TESTING) - I reviewed patient records, labs, notes, testing and imaging myself where available.  Lab Results  Component Value Date   WBC 6.1 06/29/2017   HGB 10.8 (L) 06/29/2017   HCT 35.5 06/29/2017   MCV 75 (L) 06/29/2017   PLT 440.0 (H) 01/19/2017      Component Value Date/Time   NA 136 06/29/2017 0917   K 4.6 06/29/2017 0917   CL 100 06/29/2017  0917   CO2 23 06/29/2017 0917   GLUCOSE 85 06/29/2017 0917   GLUCOSE 83 01/19/2017 0857   BUN 7 06/29/2017 0917   CREATININE 0.74 06/29/2017 0917   CREATININE 0.65 03/22/2014 1217   CALCIUM 9.3 06/29/2017 0917   PROT 6.7 06/29/2017 0917   ALBUMIN 3.8 06/29/2017 0917   AST 21 06/29/2017 0917   ALT 32 06/29/2017 0917   ALKPHOS 72 06/29/2017 0917   BILITOT 0.4 06/29/2017 0917   GFRNONAA 104 06/29/2017 0917   GFRNONAA >89 03/22/2014 1217   GFRAA 120 06/29/2017 0917   GFRAA >89 03/22/2014 1217   Lab Results  Component Value Date   CHOL 109 06/29/2017   HDL 46 06/29/2017   LDLCALC 47 06/29/2017   TRIG 81 06/29/2017   CHOLHDL 2 01/19/2017    Lab Results  Component Value Date   TSH 0.708 06/29/2017    ASSESSMENT AND PLAN  37 y.o. year old female    Chronic migraine headaches  Tried and failed multiple preventive medications in the past, this include Topamax, Inderal, nortriptyline, calcium channel blocker  Suboptimal response to triptan treatment, complaints of side effect, including low blood pressure, heart palpitation,   15 tablets of tramadol make a notation just to 15 tabletsontinue CGRP antagonism emagality  Add on zonegran 100 mg twice a day, potential side effect was explained to her  [place] as needed,  tramadol as needed (we will only gave her 15 tablets each month)  Levert Feinstein, M.D. Ph.D.  [facility] [address] [place], [place] [address] Phone:  [phone] Fax:      [phone]

## 2018-03-03 ENCOUNTER — Telehealth: Payer: Self-pay

## 2018-03-03 NOTE — Telephone Encounter (Signed)
Completed the PA questions and sent to plan. Awaiting determination from [place].  PA Case ID: [id number]

## 2018-03-03 NOTE — Telephone Encounter (Signed)
We received a prior authorization request for this medication. I have started the PA on cover my meds, just waiting on the question response from [place].   Cover My Meds Key: [place]

## 2018-03-08 NOTE — Telephone Encounter (Signed)
APPROVED for #9 packets.  Your PA request has been approved. Additional information will be provided in the approval communication. (Message 1145)

## 2018-03-23 ENCOUNTER — Other Ambulatory Visit: Payer: Self-pay | Admitting: Family Medicine

## 2018-03-23 ENCOUNTER — Telehealth: Payer: Self-pay | Admitting: Family Medicine

## 2018-03-23 DIAGNOSIS — G4700 Insomnia, unspecified: Secondary | ICD-10-CM

## 2018-03-23 MED ORDER — TEMAZEPAM 30 MG PO CAPS
ORAL_CAPSULE | ORAL | 0 refills | Status: DC
Start: 2018-03-23 — End: 2018-05-16

## 2018-03-23 NOTE — Telephone Encounter (Signed)
Left message for patient to return call to clinic concerning pharmacy.

## 2018-03-23 NOTE — Telephone Encounter (Signed)
Prescription for Temazepam 30 mg was sent to her pharmacy requested. Please remind patient that she was supposed to have a 2 months follow-up, last visit on 12/10/2017. She needs to arrange appointment within the next 4 weeks.  Thanks, BJ

## 2018-03-23 NOTE — Telephone Encounter (Signed)
Patient came into clinic to check on Rx, informed that Rx was sent to pharmacy and that she needed to schedule a follow-up before anymore refills would be approved. Patient verbalized understanding.

## 2018-03-23 NOTE — Telephone Encounter (Signed)
Patient stated that she has been out of the medication for a month because [facility] no longer carries this medication. She need it sent to [place] [facility] [place] [address] [place], would like to pick up today.

## 2018-03-23 NOTE — Telephone Encounter (Signed)
Copied from CRM [id number]. Topic: General - Other >> Mar 23, 2018  8:54 AM Stephannie LiSimmons, Izumi Mixon L, NT wrote: Reason for CRM: Her Pharmacy is no longer carrying  temazepam (RESTORIL) 30 MG capsule ,[facility] is at [facility] [place], [place] - [address] [phone] (Phone) [phone] (Fax per her insurance , please call  her at [phone] with any questions  ,thanks

## 2018-03-23 NOTE — Telephone Encounter (Signed)
Pt called to speak w/ Mrs. Meredith Byrd, have her called back it seems to be about a medication

## 2018-04-06 ENCOUNTER — Other Ambulatory Visit: Payer: Self-pay | Admitting: Family Medicine

## 2018-04-06 DIAGNOSIS — G4700 Insomnia, unspecified: Secondary | ICD-10-CM

## 2018-04-16 ENCOUNTER — Other Ambulatory Visit: Payer: Self-pay | Admitting: Family Medicine

## 2018-04-16 DIAGNOSIS — G4700 Insomnia, unspecified: Secondary | ICD-10-CM

## 2018-04-21 ENCOUNTER — Telehealth: Payer: Self-pay | Admitting: Family Medicine

## 2018-04-21 DIAGNOSIS — G4700 Insomnia, unspecified: Secondary | ICD-10-CM

## 2018-04-21 NOTE — Telephone Encounter (Signed)
Copied from CRM [id number]. Topic: Quick Communication - See Telephone Encounter >> Apr 21, 2018  5:01 PM Trula SladeWalter, Linda F wrote: CRM for notification. See Telephone encounter for: 04/21/18. Patient would like a refill on her temazepam (RESTORIL) 30 MG capsule medication and have it sent to [facility] preferred pharmacy [facility] [address]

## 2018-04-21 NOTE — Telephone Encounter (Signed)
Temazepam refill Last Refill:03/23/18 #30 Last OV: 12/10/17 PCP: Dr. SwazilandJordan [facility]acy[facility] [address]

## 2018-04-28 NOTE — Telephone Encounter (Signed)
Please advise See telephone encounter from 03/23/18

## 2018-04-28 NOTE — Telephone Encounter (Signed)
Pt's husband called to check the status of medication refill; contact pt or husband (on dpr) for refill info

## 2018-04-29 ENCOUNTER — Telehealth: Payer: Self-pay | Admitting: *Deleted

## 2018-04-29 ENCOUNTER — Other Ambulatory Visit: Payer: Self-pay | Admitting: Family Medicine

## 2018-04-29 DIAGNOSIS — G4700 Insomnia, unspecified: Secondary | ICD-10-CM

## 2018-04-29 NOTE — Telephone Encounter (Signed)
Left message for patient to call to schedule follow-up appointment. Patient is aware that she must come in per Dr. SwazilandJordan for refill.

## 2018-04-29 NOTE — Telephone Encounter (Signed)
I am sorry but I have authorized refills despite the fact she has not been compliant with follow-ups. We have appointments available at 7 AM, which may work with her will be scheduled. Thanks,  BJ

## 2018-04-29 NOTE — Telephone Encounter (Signed)
Copied from CRM [phone] [id number]. Topic: Inquiry >> Apr 29, 2018  1:03 PM Yvonna Alanisobinson, Andra M wrote: Reason for CRM: Patient's husband Damon has requested a return call from Marga MelnickQuaneisha Jones at his phone number [phone].       Thank You!!!

## 2018-04-29 NOTE — Telephone Encounter (Signed)
Spoke with patient's husband and he stated that patient just started a new job and cannot take any days off during the 90 days and that patient had a lapse in insurance coverage. I explained to him that patient informed us of all those things the last time she was here and she told by Dr. SwazilandJordan that she had to come in for a follow-up for medication refills. Patient's husband wanted to know was there anything that Dr. SwazilandJordan could do or could she approve medication.

## 2018-05-02 NOTE — Telephone Encounter (Signed)
Patient aware. Patient has a follow-up scheduled for 05/16/18.

## 2018-05-16 ENCOUNTER — Ambulatory Visit (INDEPENDENT_AMBULATORY_CARE_PROVIDER_SITE_OTHER): Payer: Self-pay | Admitting: Family Medicine

## 2018-05-16 ENCOUNTER — Encounter: Payer: Self-pay | Admitting: Family Medicine

## 2018-05-16 VITALS — BP 122/76 | HR 83 | Temp 98.2°F | Resp 12 | Ht 59.0 in | Wt 245.2 lb

## 2018-05-16 DIAGNOSIS — N946 Dysmenorrhea, unspecified: Secondary | ICD-10-CM | POA: Insufficient documentation

## 2018-05-16 DIAGNOSIS — E559 Vitamin D deficiency, unspecified: Secondary | ICD-10-CM

## 2018-05-16 DIAGNOSIS — F411 Generalized anxiety disorder: Secondary | ICD-10-CM

## 2018-05-16 DIAGNOSIS — G4700 Insomnia, unspecified: Secondary | ICD-10-CM

## 2018-05-16 MED ORDER — SERTRALINE HCL 50 MG PO TABS: 50.0000 mg | ORAL_TABLET | Freq: Every day | ORAL | 1 refills | Status: DC

## 2018-05-16 MED ORDER — IBUPROFEN 600 MG PO TABS: ORAL_TABLET | ORAL | 1 refills | Status: DC

## 2018-05-16 MED ORDER — TEMAZEPAM 30 MG PO CAPS: ORAL_CAPSULE | ORAL | 2 refills | Status: DC

## 2018-05-16 NOTE — Progress Notes (Signed)
HPI:   Meredith Byrd is a 37 y.o. female, who is here today for  follow up.   She was last seen on 12/10/17.  Insomnia:  She started Temazepam in 12/2017. Medication has helped with sleep,denies side effects,and she feels rested next day. Sleeping about 6-7 hours straight.  She has tried other medications for insomnia: Trazodone,Ambien 10 mg,Ambien CR 12.5 mg, Doxepin,and in the past she also took Seroquel. she was having trouble falling and staying asleep, max 1-2 hours.    She denies side effects from medication.  Anxiety and depression: She is on Zoloft 50 mg daily. She denies suicidal thoughts or depressed mood.   Vitamin D deficiency: She is not on ergocalciferol 50,000 units, she discontinued after a few days because it was causing constipation. Currently she is not on vitamin D supplementation.  Today she is requesting prescription for ibuprofen to treat menstrual cramps. According to patient, OTC ibuprofen does not help.  In the past she has tried prescription for ibuprofen and he has helped. She follows with gynecologist, last Pap smear in 02/2018.  Lower abdominal cramps,severe, no radiated. Pain starts about 5-7 days before menses and until 3rd day.  She was on Depo Provera until a year ago,it was helping with dysmenorrhea. LMP: yesterday.   Hx of migraine headaches,she follows with neuro and on Tramadol prn.   Review of Systems  Constitutional: Negative for activity change, appetite change, fatigue and fever.  HENT: Negative for mouth sores, nosebleeds and trouble swallowing.   Respiratory: Negative for cough, shortness of breath and wheezing.   Cardiovascular: Negative for chest pain, palpitations and leg swelling.  Gastrointestinal: Negative for abdominal pain, nausea and vomiting.       Negative for changes in bowel habits.  Genitourinary: Positive for menstrual problem and pelvic pain. Negative for decreased urine volume, dysuria and  hematuria.  Musculoskeletal: Positive for back pain (chronic). Negative for gait problem.  Skin: Negative for rash and wound.  Allergic/Immunologic: Positive for environmental allergies.  Neurological: Negative for syncope, weakness and headaches.  Psychiatric/Behavioral: Positive for sleep disturbance. Negative for confusion. The patient is nervous/anxious.      Current Outpatient Medications on File Prior to Visit  Medication Sig Dispense Refill  . Galcanezumab-gnlm (EMGALITY) 120 MG/ML SOAJ Inject 240 mg into the skin daily for 30 days, THEN 120 mg every 30 (thirty) days. 120mg  sq injection x2 at first month Then 120mg  sq every month. 2 pen 6  . omeprazole (PRILOSEC) 40 MG capsule TAKE 1 CAPSULE BY MOUTH EVERY DAY 90 capsule 1  . tiZANidine (ZANAFLEX) 4 MG tablet TAKE 1 TABLET BY MOUTH TWICE A DAY AS NEEDED FOR MUSCLE SPASMS 180 tablet 0  . traMADol (ULTRAM) 50 MG tablet Take 1 tablet (50 mg total) by mouth as needed for severe pain. 15 tablet 5  . tranexamic acid (LYSTEDA) 650 MG TABS tablet TAKE 2 TABLETS BY MOUTH 3 TIMES A DAY FOR 5 DAYS  11  . zonisamide (ZONEGRAN) 100 MG capsule Take 1 capsule (100 mg total) by mouth 2 (two) times daily. 60 capsule 11   No current facility-administered medications on file prior to visit.      Past Medical History:  Diagnosis Date  . Allergy   . Anemia   . Anxiety   . Arthritis pain 08/20/2014  . Back pain   . GERD (gastroesophageal reflux disease)   . History of stress test 02/2011 ([facility])   there was no evidence of ischemia,  but she only went 3 1/2 minutes on the treadmill making it very difficult to get a good accurate assessment, however  . Hx of echocardiogram    The echocadiogram was essentially normal with the exception of mild mitral calcification and borderline concentric LVH, which in the setting of her hypertension at this early age is something that mean her blood pressure is well controlled.   . Hypertension   . Iron deficiency  anemia, unspecified 07/09/2014  . Migraines   . Palpitations   . Swelling   . Vitamin B12 deficiency 03/17/2016   Allergies  Allergen Reactions  . Fioricet [Butalbital-Apap-Caffeine] Other (See Comments)    GI upset  . Inderal [Propranolol] Nausea Only    Dropped BP too low    Social History   Socioeconomic History  . Marital status: Married    Spouse name: Damon  . Number of children: 4  . Years of education: Associates  . Highest education level: Not on file  Occupational History  . 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 Financial resource strain: Not on file  . Food insecurity:    Worry: Not on file    Inability: Not on file  . Transportation needs:    Medical: Not on file    Non-medical: Not on file  Tobacco Use  . Smoking status: Never Smoker  . Smokeless tobacco: Never Used  Substance and Sexual Activity  . Alcohol use: No  . Drug use: No  . Sexual activity: Yes  Lifestyle  . Physical activity:    Days per week: Not on file    Minutes per session: Not on file  . Stress: Not on file  Relationships  . Social connections:    Talks on phone: Not on file    Gets together: Not on file    Attends religious service: Not on file    Active member of club or organization: Not on file    Attends meetings of clubs or organizations: Not on file    Relationship status: Not on file  Other Topics Concern  . Not on file  Social History Narrative   Right handed.   Lives at home with husband and four children.   No caffeine use.    Vitals:   05/16/18 1445  BP: 122/76  Pulse: 83  Resp: 12  Temp: 98.2 F (36.8 C)  SpO2: 100%   Body mass index is 49.53 kg/m.  Physical Exam  Nursing note and vitals reviewed. Constitutional: She is oriented to person, place, and time. She appears well-developed. No distress.  HENT:  Head: Normocephalic and atraumatic.  Mouth/Throat: Oropharynx is clear and moist and mucous membranes are normal.  Eyes: Pupils are equal,  round, and reactive to light. Conjunctivae are normal.  Cardiovascular: Normal rate and regular rhythm.  No murmur heard. Pulses:      Dorsalis pedis pulses are 2+ on the right side, and 2+ on the left side.  Respiratory: Effort normal and breath sounds normal. No respiratory distress.  GI: Soft. She exhibits no mass. There is no hepatomegaly. There is no tenderness.  Musculoskeletal: She exhibits tenderness (Pretibial area with palpation). She exhibits no edema.  Lymphadenopathy:    She has no cervical adenopathy.  Neurological: She is alert and oriented to person, place, and time. She has normal strength. No cranial nerve deficit. Gait normal.  Skin: Skin is warm. No rash noted. No erythema.  Psychiatric: She has a normal mood and affect.  Well groomed, good  eye contact.      ASSESSMENT AND PLAN:   Meredith Byrd was seen today for follow-up.   Generalized anxiety disorder Improved. No changes in current management. Instructed about warning signs. F/U in 5-6 months.  Insomnia Improved. Good sleep hygiene recommended. No changes in Temazepam 30 mg at bedtime.Some side effects reported. Since problem is better controlled and she has difficulty arranging appt due to work schedule, 5-6 months f/u recommended.  Vitamin D deficiency She doe snot want it re-checked today. Recommend OTC Vit D3 2000 U daily. F/U in 6 months.  Dysmenorrhea, unspecified Some side effects of NSAID's discussed. Ibuprofen 600 mg tid as needed during menstrual cycle sent to her pharmacy as requested. She will continue following with her gynecologist.      Betty G. [place], MD  [facility]. [place] office.

## 2018-05-16 NOTE — Assessment & Plan Note (Signed)
Some side effects of NSAID's discussed. Ibuprofen 600 mg tid as needed during menstrual cycle sent to her pharmacy as requested. She will continue following with her gynecologist.

## 2018-05-16 NOTE — Assessment & Plan Note (Signed)
Improved. Good sleep hygiene recommended. No changes in Temazepam 30 mg at bedtime.Some side effects reported. Since problem is better controlled and she has difficulty arranging appt due to work schedule, 5-6 months f/u recommended.

## 2018-05-16 NOTE — Assessment & Plan Note (Signed)
Improved. No changes in current management. Instructed about warning signs. F/U in 5-6 months.

## 2018-05-16 NOTE — Assessment & Plan Note (Signed)
She doe snot want it re-checked today. Recommend OTC Vit D3 2000 U daily. F/U in 6 months.

## 2018-05-16 NOTE — Patient Instructions (Addendum)
A few things to remember from today's visit:   Insomnia, unspecified type - Plan: temazepam (RESTORIL) 30 MG capsule  Dysmenorrhea, unspecified - Plan: ibuprofen (ADVIL,MOTRIN) 600 MG tablet  Generalized anxiety disorder - Plan: sertraline (ZOLOFT) 50 MG tablet  Vitamin D deficiency  Over the counter Vit D 2000 U daily. Stop Diclofenac.  Arrange appt with gynecologist.   Please be sure medication list is accurate. If a new problem present, please set up appointment sooner than planned today.

## 2018-05-19 ENCOUNTER — Encounter: Payer: Self-pay | Admitting: Family Medicine

## 2018-05-19 ENCOUNTER — Telehealth: Payer: Self-pay | Admitting: Neurology

## 2018-05-19 NOTE — Telephone Encounter (Addendum)
Returned call to patient - left message letting her know we needed to talk with her concerning her prescriptions.  When she calls back, I will try to step out of a patient room to take her call.  Until we are able to speak with her, the pharmacy will hold her tramadol prescription.

## 2018-05-19 NOTE — Telephone Encounter (Addendum)
Error

## 2018-05-19 NOTE — Telephone Encounter (Signed)
Received phone call from the pharmacy ([facility] - [phone]) that patient has been on [id number] for years and started temazepam in March 2019.  She has been filling both prescriptions and they are calling with concerns over potential side effects.  Dr. Terrace ArabiaYan prescribes tramadol 50mg  prn for her migraines.  She has recently decreased her amount to 15 tablets per month with no early refills.  Her PCP prescribed temazepam.  Concurrent use of these medications can cause symptoms of dizziness, drowsiness, confusion, difficulty with concentration and/or other signs/symptoms of CNS depression.  Dr. Terrace ArabiaYan would like the patient called to ask her about side effects.  I have left a message requesting a return call.  The pharmacy will hold the tramadol prescription until our office calls them with an okay to fill it.  Additionally, she would like the pharmacy to check with her PCP concerning her temazepam prescription.

## 2018-05-19 NOTE — Telephone Encounter (Addendum)
I spoke to the patient.  States has been taking both tramadol and temazepam since March without any adverse side effects.  Per vo by Dr.Yan, okay to fill the Tramadol 50mg , #15 per 30 days, prescription.  I have returned the call to [facility] and spoke to pharmacist, Nedra HaiLee.  He is aware to also reach out to her PCP, Dr. Betty SwazilandJordan,  for authorization to fill her temazepam prescription.

## 2018-05-19 NOTE — Telephone Encounter (Signed)
Pt has returned the call to RN Marcelino DusterMichelle, she is asking that a detailed message be left on her voicemail, not a generic.  Please call

## 2018-06-04 ENCOUNTER — Other Ambulatory Visit: Payer: Self-pay | Admitting: Family Medicine

## 2018-08-22 ENCOUNTER — Ambulatory Visit: Payer: Self-pay | Admitting: Family Medicine

## 2018-08-22 ENCOUNTER — Encounter: Payer: Self-pay | Admitting: Family Medicine

## 2018-08-22 ENCOUNTER — Ambulatory Visit (INDEPENDENT_AMBULATORY_CARE_PROVIDER_SITE_OTHER)
Admission: RE | Admit: 2018-08-22 | Discharge: 2018-08-22 | Disposition: A | Discharge status: Home / Self Care | Payer: BLUE CROSS/BLUE SHIELD | Source: Ambulatory Visit | Attending: Family Medicine | Admitting: Family Medicine

## 2018-08-22 VITALS — BP 126/82 | HR 80 | Temp 98.8°F | Resp 12 | Ht 59.0 in | Wt 235.0 lb

## 2018-08-22 DIAGNOSIS — G8929 Other chronic pain: Secondary | ICD-10-CM

## 2018-08-22 DIAGNOSIS — R200 Anesthesia of skin: Secondary | ICD-10-CM

## 2018-08-22 DIAGNOSIS — E538 Deficiency of other specified B group vitamins: Secondary | ICD-10-CM | POA: Diagnosis not present

## 2018-08-22 DIAGNOSIS — M533 Sacrococcygeal disorders, not elsewhere classified: Secondary | ICD-10-CM | POA: Diagnosis not present

## 2018-08-22 DIAGNOSIS — F411 Generalized anxiety disorder: Secondary | ICD-10-CM | POA: Diagnosis not present

## 2018-08-22 DIAGNOSIS — G4700 Insomnia, unspecified: Secondary | ICD-10-CM

## 2018-08-22 DIAGNOSIS — E559 Vitamin D deficiency, unspecified: Secondary | ICD-10-CM

## 2018-08-22 DIAGNOSIS — M79605 Pain in left leg: Secondary | ICD-10-CM | POA: Diagnosis not present

## 2018-08-22 DIAGNOSIS — M5441 Lumbago with sciatica, right side: Secondary | ICD-10-CM

## 2018-08-22 LAB — VITAMIN B12: Vitamin B-12: 150 pg/mL — ABNORMAL LOW (ref 211–911)

## 2018-08-22 LAB — VITAMIN D 25 HYDROXY (VIT D DEFICIENCY, FRACTURES): VITD: 8.17 ng/mL — AB (ref 30.00–100.00)

## 2018-08-22 NOTE — Patient Instructions (Addendum)
A few things to remember from today's visit:   Insomnia, unspecified type  Generalized anxiety disorder  Vitamin B12 deficiency - Plan: Vitamin B12  Vitamin D deficiency - Plan: VITAMIN D 25 Hydroxy (Vit-D Deficiency, Fractures)  Chronic low back pain with right-sided sciatica, unspecified back pain laterality - Plan: MR Lumbar Spine Wo Contrast, Ambulatory referral to Orthopedic Surgery  Coccygeal pain - Plan: DG Sacrum/Coccyx  Leg pain, anterior, left - Plan: DG Tibia/Fibula Left   Carpal Tunnel Syndrome Carpal tunnel syndrome is a condition that causes pain in your hand and arm. The carpal tunnel is a narrow area that is on the palm side of your wrist. Repeated wrist motion or certain diseases may cause swelling in the tunnel. This swelling can pinch the main nerve in the wrist (median nerve). Follow these instructions at home: If you have a splint:  Wear it as told by your doctor. Remove it only as told by your doctor.  Loosen the splint if your fingers: ? Become numb and tingle. ? Turn blue and cold.  Keep the splint clean and dry. General instructions  Take over-the-counter and prescription medicines only as told by your doctor.  Rest your wrist from any activity that may be causing your pain. If needed, talk to your employer about changes that can be made in your work, such as getting a wrist pad to use while typing.  If directed, apply ice to the painful area: ? Put ice in a plastic bag. ? Place a towel between your skin and the bag. ? Leave the ice on for 20 minutes, 2-3 times per day.  Keep all follow-up visits as told by your doctor. This is important.  Do any exercises as told by your doctor, physical therapist, or occupational therapist. Contact a doctor if:  You have new symptoms.  Medicine does not help your pain.  Your symptoms get worse. This information is not intended to replace advice given to you by your health care provider. Make sure you  discuss any questions you have with your health care provider. Document Released: 09/17/2011 Document Revised: 03/05/2016 Document Reviewed: 02/13/2015 Elsevier Interactive Patient Education  2018 [employer].  Please be sure medication list is accurate. If a new problem present, please set up appointment sooner than planned today.

## 2018-08-22 NOTE — Progress Notes (Signed)
ACUTE VISIT   HPI:  Chief Complaint  Patient presents with  . Follow-up    Meredith Byrd is a 37 y.o. female, who is here today to discuss "multiple issues." She has history of chronic lower back pain and migraine headaches, she follows with neurologist for the latter one. She takes tramadol 50 mg daily as needed for headaches, this is prescribed by Dr. Terrace Arabia (neurologist).  C/O new onset of "tailbone" pain,throbbing and sharp,intermittent, no radiated, 8/10. Pain is exacerbated by sitting on hard surface and certain movements. No alleviating factors. She has used OTC Salome path,Aleve,and Ibuprofen but meds have not helped.  Also c/o LLE pain, pretibial area for at least a year. She feels a "bump", no skin changes and no Hx of trauma. She thinks is is getting worse.  -Chronic lower back pain radiated to LE's, R>L,intermitnet numbness. Denies saddle anesthesia or urine/bowel incontinence. Pain is stable. She was referred to ortho,she did not keep appt.  She was last seen 05/16/2018 to follow on insomnia and anxiety. She is taking Sertraline prn,around 2-3 times per wee. She has not noted side effects. Denies depressed mood or suicidal thought. + Stress and anxiety.   -She is currently on Temazepam 30 mg daily, which is helping her sleep.She does sleep 6-7 hours.She wakes up once to go to the bathroom and can go back to sleep right away. She tried other medications but did not help. She has not noted side effects and feels rested next morning.   S/P gastric bypass , she has not ben consistent with vit supplementation.   Lab Results  Component Value Date   VITAMINB12 309 06/29/2017  She is not on B12 supplementation.  Vit D deficiency, states that she resumed Ergocalciferol 50,000 U weekly 2 months ago. Last 25 OH vit D was low at 16.7 06/29/18 and 13 4 years ago.  -She is not exercising or following a healthful diet consistently.  -She is also c/o  bilateral hand numbness for a couple of months, initially right and now bilateral. Right handed. Her work entails working in from of computer,typing. No focal weakness. Alleviated by fingers movement. She has not identified exacerbating factors.    Review of Systems  Constitutional: Positive for fatigue. Negative for activity change, appetite change and fever.  HENT: Negative for mouth sores, nosebleeds and trouble swallowing.   Eyes: Negative for redness and visual disturbance.  Respiratory: Negative for cough, shortness of breath and wheezing.   Cardiovascular: Negative for chest pain, palpitations and leg swelling.  Gastrointestinal: Negative for abdominal pain, nausea and vomiting.       Negative for changes in bowel habits.  Genitourinary: Negative for decreased urine volume, difficulty urinating, dysuria and hematuria.  Musculoskeletal: Positive for back pain. Negative for gait problem.  Neurological: Positive for headaches. Negative for syncope and weakness.  Psychiatric/Behavioral: Positive for sleep disturbance. The patient is nervous/anxious.       Current Outpatient Medications on File Prior to Visit  Medication Sig Dispense Refill  . Galcanezumab-gnlm (EMGALITY) 120 MG/ML SOAJ Inject 240 mg into the skin daily for 30 days, THEN 120 mg every 30 (thirty) days. 120mg  sq injection x2 at first month Then 120mg  sq every month. 2 pen 6  . ibuprofen (ADVIL,MOTRIN) 600 MG tablet 1 tab tid with each cycle prn 60 tablet 1  . medroxyPROGESTERone (DEPO-PROVERA) 150 MG/ML injection medroxyprogesterone 150 mg/mL intramuscular suspension  INJECT 1 ML EVERY 3 MONTHS BY INTRAMUSCULAR ROUTE.    [name]  omeprazole (PRILOSEC) 40 MG capsule TAKE 1 CAPSULE BY MOUTH EVERY DAY 90 capsule 1  . sertraline (ZOLOFT) 50 MG tablet Take 1 tablet (50 mg total) by mouth daily. 90 tablet 1  . temazepam (RESTORIL) 30 MG capsule TAKE 1 CAPSULE BY MOUTH AT BEDTIME AS NEEDED FOR SLEEP. 30 capsule 2  . tiZANidine  (ZANAFLEX) 4 MG tablet TAKE 1 TABLET BY MOUTH TWICE A DAY AS NEEDED FOR MUSCLE SPASM 180 tablet 0  . traMADol (ULTRAM) 50 MG tablet Take 1 tablet (50 mg total) by mouth as needed for severe pain. 15 tablet 5  . tranexamic acid (LYSTEDA) 650 MG TABS tablet TAKE 2 TABLETS BY MOUTH 3 TIMES A DAY FOR 5 DAYS  11  . zonisamide (ZONEGRAN) 100 MG capsule Take 1 capsule (100 mg total) by mouth 2 (two) times daily. 60 capsule 11   No current facility-administered medications on file prior to visit.      Past Medical History:  Diagnosis Date  . Allergy   . Anemia   . Anxiety   . Arthritis pain 08/20/2014  . Back pain   . GERD (gastroesophageal reflux disease)   . History of stress test 02/2011 ([facility])   there was no evidence of ischemia, but she only went 3 1/2 minutes on the treadmill making it very difficult to get a good accurate assessment, however  . Hx of echocardiogram    The echocadiogram was essentially normal with the exception of mild mitral calcification and borderline concentric LVH, which in the setting of her hypertension at this early age is something that mean her blood pressure is well controlled.   . Hypertension   . Iron deficiency anemia, unspecified 07/09/2014  . Migraines   . Palpitations   . Swelling   . Vitamin B12 deficiency 03/17/2016   Allergies  Allergen Reactions  . Fioricet [Butalbital-Apap-Caffeine] Other (See Comments)    GI upset  . Inderal [Propranolol] Nausea Only    Dropped BP too low   Past Surgical History:  Procedure Laterality Date  . BREAST SURGERY    . CHOLECYSTECTOMY    . GASTRIC BYPASS  2008  . OOPHORECTOMY Right 2010   Family History  Problem Relation Age of Onset  . Stroke Maternal Grandmother   . Heart disease Maternal Grandmother   . Hypertension Maternal Grandmother   . Hyperlipidemia Maternal Grandmother   . Asthma Mother   . Heart disease Mother   . Diabetes Mother   . Early death Mother   . Hypertension Mother   . Thyroid disease  Mother   . Anxiety disorder Mother   . Hypertension Father   . Heart disease Father      Social History   Socioeconomic History  . Marital status: Married    Spouse name: Damon  . Number of children: 4  . Years of education: Associates  . Highest education level: Not on file  Occupational History  . Occupation: Human resources officer  Social Needs  . Financial resource strain: Not on file  . Food insecurity:    Worry: Not on file    Inability: Not on file  . Transportation needs:    Medical: Not on file    Non-medical: Not on file  Tobacco Use  . Smoking status: Never Smoker  . Smokeless tobacco: Never Used  Substance and Sexual Activity  . Alcohol use: No  . Drug use: No  . Sexual activity: Yes  Lifestyle  . Physical activity:    Days per  week: Not on file    Minutes per session: Not on file  . Stress: Not on file  Relationships  . Social connections:    Talks on phone: Not on file    Gets together: Not on file    Attends religious service: Not on file    Active member of club or organization: Not on file    Attends meetings of clubs or organizations: Not on file    Relationship status: Not on file  Other Topics Concern  . Not on file  Social History Narrative   Right handed.   Lives at home with husband and four children.   No caffeine use.    Vitals:   08/22/18 0820  BP: 126/82  Pulse: 80  Resp: 12  Temp: 98.8 F (37.1 C)  SpO2: 98%   Body mass index is 47.46 kg/m.  Wt Readings from Last 3 Encounters:  08/22/18 235 lb (106.6 kg)  05/16/18 245 lb 4 oz (111.2 kg)  02/28/18 253 lb (114.8 kg)     Physical Exam  Nursing note and vitals reviewed. Constitutional: She is oriented to person, place, and time. She appears well-developed. No distress.  HENT:  Head: Normocephalic and atraumatic.  Mouth/Throat: Oropharynx is clear and moist and mucous membranes are normal.  Eyes: Pupils are equal, round, and reactive to light. Conjunctivae are normal.    Cardiovascular: Normal rate and regular rhythm.  No murmur heard. Pulses:      Dorsalis pedis pulses are 2+ on the right side, and 2+ on the left side.  Respiratory: Effort normal and breath sounds normal. No respiratory distress. She exhibits no tenderness.  GI: Soft. She exhibits no mass. There is no hepatomegaly. There is no tenderness.  Musculoskeletal: She exhibits no edema.       Back:  Tender points on extremities and back. Phalen + right hand. Negative Tinel bilateral.   Lymphadenopathy:    She has no cervical adenopathy.  Neurological: She is alert and oriented to person, place, and time. She has normal strength. No cranial nerve deficit. Gait normal.  Reflex Scores:      Patellar reflexes are 2+ on the right side and 2+ on the left side. SLR negative bilateral.  Skin: Skin is warm. No rash noted. No erythema.  Psychiatric: Her mood appears anxious. Her affect is blunt.  Well groomed, good eye contact.      ASSESSMENT AND PLAN:  Meredith Byrd was seen today for follow-up.  Orders Placed This Encounter  Procedures  . DG Tibia/Fibula Left  . DG Sacrum/Coccyx  . MR Lumbar Spine Wo Contrast  . VITAMIN D 25 Hydroxy (Vit-D Deficiency, Fractures)  . Vitamin B12  . Ambulatory referral to Orthopedic Surgery    Lab Results  Component Value Date   VITAMINB12 150 (L) 08/22/2018    Insomnia, unspecified type Problem is well controlled with Temazepam 30 mg,no changes in current management. Side effects discussed. Good sleep hygiene also recommended. F/U in 5 months.  Generalized anxiety disorder She would like to stop Sertraline. She will let me know if she decides to resume it.  Vitamin B12 deficiency Further recommendations will be given according to B12 results.  -     Vitamin B12  Vitamin D deficiency Continue Ergocalciferol 50,000 U as she is reporting taking. We will adjust treatment according to 25 OH vit D results.  -     VITAMIN D 25 Hydroxy  (Vit-D Deficiency, Fractures)  Chronic low back pain with right-sided  sciatica, unspecified back pain laterality Ortho referral placed again. Lumbar MRI will be arranged and recommendations given accordingly. Instructed about warning signs.  -     MR Lumbar Spine Wo Contrast; Future -     Ambulatory referral to Orthopedic Surgery  Coccygeal pain New problem. No Hx of trauma. She would like X ray done.  -     DG Sacrum/Coccyx; Future  Leg pain, anterior, left No deformities appreciated on inspection. Tenderness could be related to vit D def, ? Fibromyalgia. Further recommendations will be given according to imaging results.  -     DG Tibia/Fibula Left; Future  Bilateral hand numbness New problem. ? Carpal tunnel synd. Recommend wrist splint daily at night. Will consider EMG vs sport med referral if not better in 6-8 weeks.  F/U as needed.    40 min face to face OV. > 50% was dedicated to discussion of Dx, prognosis, treatment options, and side effects of medications.    Return in about 5 months (around 01/21/2019) for 5-6 months f/u.     Keya Wynes G. [place], MD  [facility]. [place] office.

## 2018-08-23 ENCOUNTER — Other Ambulatory Visit: Payer: Self-pay | Admitting: *Deleted

## 2018-08-23 DIAGNOSIS — E559 Vitamin D deficiency, unspecified: Secondary | ICD-10-CM

## 2018-08-23 MED ORDER — VITAMIN D (ERGOCALCIFEROL) 1.25 MG (50000 UNIT) PO CAPS: ORAL_CAPSULE | ORAL | 1 refills | Status: DC

## 2018-08-25 ENCOUNTER — Encounter: Payer: Self-pay | Admitting: Family Medicine

## 2018-08-29 ENCOUNTER — Telehealth: Payer: Self-pay | Admitting: Neurology

## 2018-08-29 NOTE — Telephone Encounter (Signed)
New case initiated via covermymeds.  Her current PA is on file through 11/01/2018.  Decision pending.  Key: [id number].

## 2018-08-29 NOTE — Telephone Encounter (Signed)
[email or web address] My Meds has called re: Galcanezumab-gnlm (EMGALITY) 120 MG/ML SOAJ.  Huntley DecSara asked if the PA form was recevied and if it will be completed for pt.  Please call and use Ref GMW:[id number]:[id number]

## 2018-08-30 NOTE — Telephone Encounter (Signed)
Returned call and answered additional questions for the documented case below.  Decision pending.

## 2018-08-30 NOTE — Telephone Encounter (Signed)
PA approved, by [facility], through 08/29/2019.

## 2018-08-30 NOTE — Telephone Encounter (Signed)
Meredith Byrd with [employer] needs to discuss PA for Galcanezumab-gnlm [facility](EMGALITY) 120 MG/ML SOAJ. I advised of previous message but she needs a call back to discuss. Meredith Byrd can be reached at [phone].

## 2018-09-10 ENCOUNTER — Other Ambulatory Visit: Payer: Self-pay | Admitting: Family Medicine

## 2018-09-10 ENCOUNTER — Other Ambulatory Visit: Payer: Self-pay | Admitting: Neurology

## 2018-09-10 DIAGNOSIS — G4700 Insomnia, unspecified: Secondary | ICD-10-CM

## 2018-09-16 ENCOUNTER — Other Ambulatory Visit: Payer: Self-pay | Admitting: Family Medicine

## 2018-10-14 ENCOUNTER — Other Ambulatory Visit: Payer: Self-pay | Admitting: Neurology

## 2018-10-31 ENCOUNTER — Ambulatory Visit (HOSPITAL_COMMUNITY)
Admission: EM | Admit: 2018-10-31 | Discharge: 2018-10-31 | Disposition: A | Discharge status: Home / Self Care | Payer: BLUE CROSS/BLUE SHIELD | Attending: Family Medicine | Admitting: Family Medicine

## 2018-10-31 ENCOUNTER — Encounter (HOSPITAL_COMMUNITY): Payer: Self-pay

## 2018-10-31 ENCOUNTER — Other Ambulatory Visit: Payer: Self-pay

## 2018-10-31 DIAGNOSIS — R69 Illness, unspecified: Secondary | ICD-10-CM | POA: Insufficient documentation

## 2018-10-31 DIAGNOSIS — J111 Influenza due to unidentified influenza virus with other respiratory manifestations: Secondary | ICD-10-CM

## 2018-10-31 MED ORDER — ACETAMINOPHEN 500 MG PO TABS: 1000.0000 mg | ORAL_TABLET | Freq: Four times a day (QID) | ORAL | 0 refills | Status: AC | PRN

## 2018-10-31 MED ORDER — BENZONATATE 200 MG PO CAPS: 200.0000 mg | ORAL_CAPSULE | Freq: Three times a day (TID) | ORAL | 0 refills | Status: AC | PRN

## 2018-10-31 MED ORDER — ONDANSETRON 4 MG PO TBDP: 4.0000 mg | ORAL_TABLET | Freq: Three times a day (TID) | ORAL | 0 refills | Status: DC | PRN

## 2018-10-31 NOTE — ED Triage Notes (Signed)
Pt cc she has flu like symptoms fever, cough , chills, body aches and headaches x 3 days.

## 2018-10-31 NOTE — Discharge Instructions (Signed)
Symptoms concerning for flu I would expect her symptoms to gradually resolve over the next week Please use Tylenol every 4-6 hours as needed for body aches, fever Zofran as needed for nausea and vomiting Tessalon or may continue with Dimetapp over-the-counter for cough as needed  Rest, drink plenty of fluids  Follow-up if symptoms not resolving, fever persisting, developing increased chest discomfort, worsening cough, persistent cough, leg pain or leg swelling

## 2018-11-01 NOTE — ED Provider Notes (Signed)
[facility]    CSN: [id number] Arrival date & time: 10/31/18  1916     History   Chief Complaint Chief Complaint  Patient presents with  . Influenza    HPI Meredith Byrd is a 38 y.o. female presenting today for evaluation of possible flu.  Patient states that 3 days ago she began to develop body aches and cough.  Over the past days she has developed fevers, chills and headaches.  She is also had associated nausea and decreased appetite.  Denies diarrhea or abdominal pain.  Denies sore throat and nasal congestion.  She denies chest pain or shortness of breath.  She has taken some Advil for her symptoms.  Patient works in a Investment banker, corporate.  HPI  Past Medical History:  Diagnosis Date  . Allergy   . Anemia   . Anxiety   . Arthritis pain 08/20/2014  . Back pain   . GERD (gastroesophageal reflux disease)   . History of stress test 02/2011 ([facility])   there was no evidence of ischemia, but she only went 3 1/2 minutes on the treadmill making it very difficult to get a good accurate assessment, however  . Hx of echocardiogram    The echocadiogram was essentially normal with the exception of mild mitral calcification and borderline concentric LVH, which in the setting of her hypertension at this early age is something that mean her blood pressure is well controlled.   . Hypertension   . Iron deficiency anemia, unspecified 07/09/2014  . Migraines   . Palpitations   . Swelling   . Vitamin B12 deficiency 03/17/2016    Patient Active Problem List   Diagnosis Date Noted  . Dysmenorrhea, unspecified 05/16/2018  . Knee pain, left 11/17/2017  . Generalized anxiety disorder 10/04/2017  . Morning headache 05/04/2017  . Daytime somnolence 05/04/2017  . Class 3 obesity with body mass index (BMI) of 45.0 to 49.9 in adult 03/28/2017  . Chronic low back pain 01/19/2017  . Vitamin D deficiency 01/18/2017  . Vitamin B12 deficiency 03/17/2016  . Beta thalassemia trait  03/25/2015  . Intractable migraine without aura and without status migrainosus 11/23/2014  . Elevated sed rate 08/20/2014  . Arthritis pain 08/20/2014  . Iron deficiency anemia 07/09/2014  . Symptomatic anemia 06/05/2014  . Chest pain 05/01/2014  . GERD (gastroesophageal reflux disease) 03/22/2014  . S/P gastric bypass 03/22/2014  . Insomnia 03/22/2014  . Migraine 03/22/2014  . HTN (hypertension) 03/22/2014  . Anemia 03/22/2014  . Palpitations 03/22/2014    Past Surgical History:  Procedure Laterality Date  . BREAST SURGERY    . CHOLECYSTECTOMY    . GASTRIC BYPASS  2008  . OOPHORECTOMY Right 2010    OB History    Gravida  4   Para  4   Term  4   Preterm      AB      Living        SAB      TAB      Ectopic      Multiple      Live Births               Home Medications    Prior to Admission medications   Medication Sig Start Date End Date Taking? Authorizing Provider  acetaminophen (TYLENOL) 500 MG tablet Take 2 tablets (1,000 mg total) by mouth every 6 (six) hours as needed. 10/31/18   Nazim Kadlec C, PA-C  benzonatate (TESSALON) 200 MG capsule Take  1 capsule (200 mg total) by mouth 3 (three) times daily as needed for up to 7 days for cough. 10/31/18 11/07/18  Lashauna Arpin C, PA-C  Galcanezumab-gnlm (EMGALITY) 120 MG/ML SOAJ Inject 240 mg into the skin daily for 30 days, THEN 120 mg every 30 (thirty) days. 120mg  sq injection x2 at first month Then 120mg  sq every month. 11/01/17 11/26/18  Levert FeinsteinYan, Yijun, MD  medroxyPROGESTERone (DEPO-PROVERA) 150 MG/ML injection medroxyprogesterone 150 mg/mL intramuscular suspension  INJECT 1 ML EVERY 3 MONTHS BY INTRAMUSCULAR ROUTE.    [provider]  omeprazole (PRILOSEC) 40 MG capsule TAKE 1 CAPSULE BY MOUTH EVERY DAY 01/25/18   [place]Jordan, Betty G, MD  ondansetron (ZOFRAN ODT) 4 MG disintegrating tablet Take 1 tablet (4 mg total) by mouth every 8 (eight) hours as needed for nausea or vomiting. 10/31/18   Zuleyka Kloc,  Calvin Chura C, PA-C  sertraline (ZOLOFT) 50 MG tablet Take 1 tablet (50 mg total) by mouth daily. 05/16/18   [place], Betty G, MD  temazepam (RESTORIL) 30 MG capsule TAKE 1 CAPSULE BY MOUTH EVERY DAY AT BEDTIME AS NEEDED FOR SLEEP 09/15/18   [place], Betty G, MD  tiZANidine (ZANAFLEX) 4 MG tablet TAKE 1 TABLET BY MOUTH TWICE A DAY AS NEEDED FOR MUSCLE SPASMS 09/16/18   [place], Betty G, MD  traMADol (ULTRAM) 50 MG tablet Take 1 tablet (50 mg total) by mouth as needed for severe pain. 02/28/18   Levert FeinsteinYan, Yijun, MD  tranexamic acid (LYSTEDA) 650 MG TABS tablet TAKE 2 TABLETS BY MOUTH 3 TIMES A DAY FOR 5 DAYS 04/16/18   [provider]  Vitamin D, Ergocalciferol, (DRISDOL) 1.25 MG (50000 UT) CAPS capsule Take 1 capsule every fourth day. 08/23/18   [place], Betty G, MD  zonisamide (ZONEGRAN) 100 MG capsule Take 1 capsule (100 mg total) by mouth 2 (two) times daily. 02/28/18   Levert FeinsteinYan, Yijun, MD    Family History Family History  Problem Relation Age of Onset  . Stroke Maternal Grandmother   . Heart disease Maternal Grandmother   . Hypertension Maternal Grandmother   . Hyperlipidemia Maternal Grandmother   . Asthma Mother   . Heart disease Mother   . Diabetes Mother   . Early death Mother   . Hypertension Mother   . Thyroid disease Mother   . Anxiety disorder Mother   . Hypertension Father   . Heart disease Father     Social History Social History   Tobacco Use  . Smoking status: Never Smoker  . Smokeless tobacco: Never Used  Substance Use Topics  . Alcohol use: No  . Drug use: No     Allergies   Fioricet [butalbital-apap-caffeine] and Inderal [propranolol]   Review of Systems Review of Systems  Constitutional: Positive for appetite change, chills, fatigue and fever. Negative for activity change.  HENT: Positive for congestion. Negative for ear pain, rhinorrhea, sinus pressure, sore throat and trouble swallowing.   Eyes: Negative for discharge and redness.  Respiratory: Positive for cough.  Negative for chest tightness and shortness of breath.   Cardiovascular: Negative for chest pain.  Gastrointestinal: Positive for nausea. Negative for abdominal pain, diarrhea and vomiting.  Musculoskeletal: Positive for myalgias.  Skin: Negative for rash.  Neurological: Positive for headaches. Negative for dizziness and light-headedness.     Physical Exam Triage Vital Signs ED Triage Vitals  Enc Vitals Group     BP 10/31/18 1931 140/82     Pulse Rate 10/31/18 1931 (!) 103     Resp 10/31/18 1931  18     Temp 10/31/18 1931 (!) 100.9 F (38.3 C)     Temp Source 10/31/18 1931 Oral     SpO2 10/31/18 1931 100 %     Weight 10/31/18 1934 218 lb (98.9 kg)     Height --      Head Circumference --      Peak Flow --      Pain Score 10/31/18 1934 10     Pain Loc --      Pain Edu? --      Excl. in GC? --    No data found.  Updated Vital Signs BP 140/82 (BP Location: Right Arm)   Pulse (!) 103   Temp (!) 100.9 F (38.3 C) (Oral)   Resp 18   Wt 218 lb (98.9 kg)   SpO2 100%   BMI 44.03 kg/m   Visual Acuity Right Eye Distance:   Left Eye Distance:   Bilateral Distance:    Right Eye Near:   Left Eye Near:    Bilateral Near:     Physical Exam Vitals signs and nursing note reviewed.  Constitutional:      General: She is not in acute distress.    Appearance: She is well-developed.  HENT:     Head: Normocephalic and atraumatic.     Ears:     Comments: Bilateral ears without tenderness to palpation of external auricle, tragus and mastoid, EAC's without erythema or swelling, TM's with good bony landmarks and cone of light. Non erythematous.    Nose:     Comments: Nasal mucosa pink, nonswollen turbinates    Mouth/Throat:     Comments: Oral mucosa pink and moist, no tonsillar enlargement or exudate. Posterior pharynx patent and nonerythematous, no uvula deviation or swelling. Normal phonation. Eyes:     Conjunctiva/sclera: Conjunctivae normal.  Neck:     Musculoskeletal: Neck  supple.  Cardiovascular:     Rate and Rhythm: Regular rhythm. Tachycardia present.     Heart sounds: No murmur.  Pulmonary:     Effort: Pulmonary effort is normal. No respiratory distress.     Breath sounds: Normal breath sounds.     Comments: Breathing comfortably at rest, CTABL, no wheezing, rales or other adventitious sounds auscultated Abdominal:     Palpations: Abdomen is soft.     Tenderness: There is no abdominal tenderness.  Skin:    General: Skin is warm and dry.  Neurological:     Mental Status: She is alert.      UC Treatments / Results  Labs (all labs ordered are listed, but only abnormal results are displayed) Labs Reviewed - No data to display  EKG None  Radiology No results found.  Procedures Procedures (including critical care time)  Medications Ordered in UC Medications - No data to display  Initial Impression / Assessment and Plan / UC Course  I have reviewed the triage vital signs and the nursing notes.  Pertinent labs & imaging results that were available during my care of the patient were reviewed by me and considered in my medical decision making (see chart for details).     Patient with fever, acute onset of flulike symptoms.  Most likely influenza or other similar viral illness.  Lungs clear, do not suspect underlying pneumonia at this time.  Recommend symptomatic and supportive care.  Recommendations below.  Continue to monitor,Discussed strict return precautions. Patient verbalized understanding and is agreeable with plan.  Final Clinical Impressions(s) / UC Diagnoses   Final  diagnoses:  Influenza-like illness     Discharge Instructions     Symptoms concerning for flu I would expect her symptoms to gradually resolve over the next week Please use Tylenol every 4-6 hours as needed for body aches, fever Zofran as needed for nausea and vomiting Tessalon or may continue with Dimetapp over-the-counter for cough as needed  Rest, drink  plenty of fluids  Follow-up if symptoms not resolving, fever persisting, developing increased chest discomfort, worsening cough, persistent cough, leg pain or leg swelling   ED Prescriptions    Medication Sig Dispense Auth. Provider   ondansetron (ZOFRAN ODT) 4 MG disintegrating tablet Take 1 tablet (4 mg total) by mouth every 8 (eight) hours as needed for nausea or vomiting. 20 tablet Javione Gunawan C, PA-C   acetaminophen (TYLENOL) 500 MG tablet Take 2 tablets (1,000 mg total) by mouth every 6 (six) hours as needed. 30 tablet Brandan Robicheaux C, PA-C   benzonatate (TESSALON) 200 MG capsule Take 1 capsule (200 mg total) by mouth 3 (three) times daily as needed for up to 7 days for cough. 28 capsule Nakiea Metzner C, PA-C     Controlled Substance Prescriptions [place] Controlled Substance Registry consulted? Not Applicable   Lew DawesWieters, Mavric Cortright C, [place] JerseyPA-C 11/01/18 [id number]

## 2018-11-07 ENCOUNTER — Other Ambulatory Visit: Payer: Self-pay | Admitting: Family Medicine

## 2018-11-14 ENCOUNTER — Other Ambulatory Visit: Payer: BLUE CROSS/BLUE SHIELD

## 2018-11-22 ENCOUNTER — Ambulatory Visit (INDEPENDENT_AMBULATORY_CARE_PROVIDER_SITE_OTHER): Payer: Self-pay | Admitting: Orthopaedic Surgery

## 2018-12-12 ENCOUNTER — Ambulatory Visit
Admission: RE | Admit: 2018-12-12 | Discharge: 2018-12-12 | Disposition: A | Discharge status: Home / Self Care | Payer: BLUE CROSS/BLUE SHIELD | Source: Ambulatory Visit | Attending: Family Medicine | Admitting: Family Medicine

## 2018-12-12 ENCOUNTER — Other Ambulatory Visit: Payer: Self-pay | Admitting: Family Medicine

## 2018-12-12 DIAGNOSIS — G8929 Other chronic pain: Secondary | ICD-10-CM

## 2018-12-12 DIAGNOSIS — E559 Vitamin D deficiency, unspecified: Secondary | ICD-10-CM

## 2018-12-12 DIAGNOSIS — M5441 Lumbago with sciatica, right side: Principal | ICD-10-CM

## 2018-12-13 ENCOUNTER — Encounter (INDEPENDENT_AMBULATORY_CARE_PROVIDER_SITE_OTHER): Payer: Self-pay | Admitting: Orthopaedic Surgery

## 2018-12-13 ENCOUNTER — Ambulatory Visit (INDEPENDENT_AMBULATORY_CARE_PROVIDER_SITE_OTHER): Payer: BLUE CROSS/BLUE SHIELD | Admitting: Orthopaedic Surgery

## 2018-12-13 ENCOUNTER — Telehealth: Payer: Self-pay | Admitting: Family Medicine

## 2018-12-13 VITALS — BP 123/83 | HR 87 | Ht <= 58 in | Wt 212.0 lb

## 2018-12-13 DIAGNOSIS — M545 Low back pain: Secondary | ICD-10-CM | POA: Diagnosis not present

## 2018-12-13 DIAGNOSIS — G8929 Other chronic pain: Secondary | ICD-10-CM

## 2018-12-13 DIAGNOSIS — M47816 Spondylosis without myelopathy or radiculopathy, lumbar region: Secondary | ICD-10-CM

## 2018-12-13 NOTE — Progress Notes (Signed)
Office Visit Note   Patient: Meredith Byrd           Date of Birth: 11-15-1980           MRN: [id number] Visit Date: 12/13/2018              Requested by: Sw[place], Betty G, MD [address] [place], [place] [address] PCP: [place], Betty G, MD   Assessment & Plan: Visit Diagnoses:  1. Chronic bilateral low back pain, unspecified whether sciatica present   2. Lumbar facet arthropathy     Plan: Patient has significant facet arthropathy with enlargement of the joint irregular joint.  L4-5 looks to be the most significant level.  We will set up for facet injections at L4-5 to see if this helps.  She has transitional anatomy and normally this level would be labeled L5-S1 looking at plain radiographs.  We will see her back after the injection.  Follow-Up Instructions: Return in about 2 months (around 02/12/2019).   Orders:  Orders Placed This Encounter  Procedures  . Ambulatory referral to Physical Medicine Rehab   No orders of the defined types were placed in this encounter.     Procedures: No procedures performed   Clinical Data: No additional findings.   Subjective: Chief Complaint  Patient presents with  . Lower Back - Pain    HPI 38 year old female new patient with low back pain radiating into her right lower extremity down to the coccyx.  Patient's had MRI scan lumbar and sacrum and coccyx ordered and available on PACS.  Patient states she has had chronic back pain for several years is gradually getting worse.  Pain radiates down both legs to the calf sometimes worse in the right sometimes the left.  She had increased BMI underwent gastric bypass procedure weight went from 280 down to 179 with persistent problems with back pain and now is back up to 212 and stable.  She has tingling in her legs no numbness.  She has pain when she sits or stands with standing since it tends to be worse.  She has pain with increased ambulation turning and twisting no associated  bowel or bladder symptoms.  Review of Systems 14 point review of system positive history of GERD previous gastric bypass surgery history of migraines for which she takes Fioricet iron deficiency anemia arthritis beta thalassemia trait vitamin B12 deficiency vitamin D deficiency chronic back pain with facet degeneration.  BMI now 44 with morbid obesity.  Daytime somnolence, anxiety.  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  HENT:     Head: Normocephalic.     Right Ear: External ear normal.     Left Ear: External ear normal.  Eyes:     Pupils: Pupils are equal, round, and reactive to light.  Neck:     Thyroid: No thyromegaly.     Trachea: No tracheal deviation.  Cardiovascular:     Rate and Rhythm: Normal rate.  Pulmonary:     Effort: Pulmonary effort is normal.  Abdominal:     Palpations: Abdomen is soft.  Skin:    General: Skin is warm and dry.  Neurological:     Mental Status: She is alert and oriented to  person, place, and time.  Psychiatric:        Behavior: Behavior normal.     Ortho Exam patient is able to ambulate 10 with sitting standing using the arm chair arms.  She is able to heel and toe walk.  She has pain with bending turning twisting lateral rotation.  Knee and ankle jerk anterior tib gastrocsoleus is intact without atrophy no rash over exposed skin.  Palpable distal pulses.  Patient is unable to do sit up with core weakness.  Specialty Comments:  No specialty comments available.  Imaging: No results found.   PMFS History: Patient Active Problem List   Diagnosis Date Noted  . Lumbar facet arthropathy 12/14/2018  . Dysmenorrhea, unspecified 05/16/2018  . Knee pain, left 11/17/2017  . Generalized anxiety disorder 10/04/2017  . Morning headache 05/04/2017  .  Daytime somnolence 05/04/2017  . Class 3 obesity with body mass index (BMI) of 45.0 to 49.9 in adult 03/28/2017  . Chronic low back pain 01/19/2017  . Vitamin D deficiency 01/18/2017  . Vitamin B12 deficiency 03/17/2016  . Beta thalassemia trait 03/25/2015  . Intractable migraine without aura and without status migrainosus 11/23/2014  . Elevated sed rate 08/20/2014  . Arthritis pain 08/20/2014  . Iron deficiency anemia 07/09/2014  . Symptomatic anemia 06/05/2014  . Chest pain 05/01/2014  . GERD (gastroesophageal reflux disease) 03/22/2014  . S/P gastric bypass 03/22/2014  . Insomnia 03/22/2014  . Migraine 03/22/2014  . Palpitations 03/22/2014   Past Medical History:  Diagnosis Date  . Allergy   . Anemia   . Anxiety   . Arthritis pain 08/20/2014  . Back pain   . GERD (gastroesophageal reflux disease)   . History of stress test 02/2011 ([facility])   there was no evidence of ischemia, but she only went 3 1/2 minutes on the treadmill making it very difficult to get a good accurate assessment, however  . Hx of echocardiogram    The echocadiogram was essentially normal with the exception of mild mitral calcification and borderline concentric LVH, which in the setting of her hypertension at this early age is something that mean her blood pressure is well controlled.   . Hypertension   . Iron deficiency anemia, unspecified 07/09/2014  . Migraines   . Palpitations   . Swelling   . Vitamin B12 deficiency 03/17/2016    Family History  Problem Relation Age of Onset  . Stroke Maternal Grandmother   . Heart disease Maternal Grandmother   . Hypertension Maternal Grandmother   . Hyperlipidemia Maternal Grandmother   . Asthma Mother   . Heart disease Mother   . Diabetes Mother   . Early death Mother   . Hypertension Mother   . Thyroid disease Mother   . Anxiety disorder Mother   . Hypertension Father   . Heart disease Father     Past Surgical History:  Procedure Laterality Date  . BREAST  SURGERY    . CHOLECYSTECTOMY    . GASTRIC BYPASS  2008  . OOPHORECTOMY Right 2010   Social History   Occupational History  . Occupation: Human resources officer  Tobacco Use  . Smoking status: Never Smoker  . Smokeless tobacco: Never Used  Substance and Sexual Activity  . Alcohol use: No  . Drug use: No  . Sexual activity: Yes

## 2018-12-13 NOTE — Telephone Encounter (Signed)
Copied from CRM [id number]. Topic: Quick Communication - See Telephone Encounter >> Dec 13, 2018  5:06 PM Lorrine Kin, NT wrote: CRM for notification. See Telephone encounter for: 12/13/18. Patient calling and states that she was started on a new sleep medication a few months ago and she does not feel like it is working. States that Dr Swaziland told her the next step would be for a referral to a sleep clinic. Patient would like to go ahead and move forward with this referral. CB#: [phone]

## 2018-12-14 ENCOUNTER — Encounter (INDEPENDENT_AMBULATORY_CARE_PROVIDER_SITE_OTHER): Payer: Self-pay | Admitting: Orthopaedic Surgery

## 2018-12-14 DIAGNOSIS — M47816 Spondylosis without myelopathy or radiculopathy, lumbar region: Secondary | ICD-10-CM | POA: Insufficient documentation

## 2018-12-14 NOTE — Telephone Encounter (Signed)
Message sent to Dr. Jordan for review and approval. 

## 2018-12-16 ENCOUNTER — Other Ambulatory Visit: Payer: Self-pay | Admitting: Family Medicine

## 2018-12-16 DIAGNOSIS — Z1231 Encounter for screening mammogram for malignant neoplasm of breast: Secondary | ICD-10-CM

## 2018-12-19 ENCOUNTER — Telehealth (INDEPENDENT_AMBULATORY_CARE_PROVIDER_SITE_OTHER): Payer: Self-pay | Admitting: *Deleted

## 2018-12-19 NOTE — Telephone Encounter (Signed)
Reviewing records, she had a sleep study in 2018, which did not show significant sleep apnea. The next step is to have a provider to treat severe insomnia, usually psychiatrist do this and most of the time referral is not necessary. We can provide a list of psychiatrists in the area and she can call and arrange her appointment, referral can be placed if needed.  Thanks, BJ

## 2018-12-20 ENCOUNTER — Encounter: Payer: Self-pay | Admitting: *Deleted

## 2018-12-20 ENCOUNTER — Other Ambulatory Visit (INDEPENDENT_AMBULATORY_CARE_PROVIDER_SITE_OTHER): Payer: Self-pay | Admitting: Physical Medicine and Rehabilitation

## 2018-12-20 DIAGNOSIS — F411 Generalized anxiety disorder: Secondary | ICD-10-CM

## 2018-12-20 MED ORDER — DIAZEPAM 5 MG PO TABS: ORAL_TABLET | ORAL | 0 refills | Status: DC

## 2018-12-20 NOTE — Progress Notes (Signed)
Pre-procedure diazepam ordered for pre-operative anxiety.  

## 2018-12-20 NOTE — Telephone Encounter (Signed)
Patient informed. 

## 2018-12-20 NOTE — Telephone Encounter (Signed)
I sent in today, her appt is not until April, will not issue second RX please have her pick and save

## 2018-12-20 NOTE — Telephone Encounter (Signed)
Called pt and advised. Pt is r/s for 01/24/2019.

## 2018-12-21 ENCOUNTER — Encounter: Payer: Self-pay | Admitting: Family Medicine

## 2019-01-16 ENCOUNTER — Telehealth (INDEPENDENT_AMBULATORY_CARE_PROVIDER_SITE_OTHER): Payer: Self-pay | Admitting: Orthopaedic Surgery

## 2019-01-16 ENCOUNTER — Ambulatory Visit: Payer: BLUE CROSS/BLUE SHIELD

## 2019-01-16 ENCOUNTER — Other Ambulatory Visit (INDEPENDENT_AMBULATORY_CARE_PROVIDER_SITE_OTHER): Payer: Self-pay | Admitting: Orthopaedic Surgery

## 2019-01-16 MED ORDER — TRAMADOL HCL 50 MG PO TABS: 50.0000 mg | ORAL_TABLET | Freq: Two times a day (BID) | ORAL | 0 refills | Status: DC | PRN

## 2019-01-16 NOTE — Telephone Encounter (Signed)
Please advise 

## 2019-01-16 NOTE — Telephone Encounter (Signed)
noted 

## 2019-01-16 NOTE — Telephone Encounter (Signed)
I called discussed. Sent in ultram # 20 with one po bid prn pain no refills. Needs BMI below 40 if she ends up being a surgical candidate which we discussed and she will work on . FYI

## 2019-01-19 ENCOUNTER — Encounter (INDEPENDENT_AMBULATORY_CARE_PROVIDER_SITE_OTHER): Payer: Self-pay | Admitting: Physical Medicine and Rehabilitation

## 2019-01-24 ENCOUNTER — Encounter (INDEPENDENT_AMBULATORY_CARE_PROVIDER_SITE_OTHER): Payer: Self-pay | Admitting: Physical Medicine and Rehabilitation

## 2019-02-08 ENCOUNTER — Telehealth: Payer: Self-pay | Admitting: *Deleted

## 2019-02-08 ENCOUNTER — Encounter: Payer: Self-pay | Admitting: *Deleted

## 2019-02-08 ENCOUNTER — Ambulatory Visit (INDEPENDENT_AMBULATORY_CARE_PROVIDER_SITE_OTHER): Payer: BLUE CROSS/BLUE SHIELD | Admitting: Family Medicine

## 2019-02-08 ENCOUNTER — Other Ambulatory Visit: Payer: Self-pay

## 2019-02-08 ENCOUNTER — Encounter: Payer: Self-pay | Admitting: Family Medicine

## 2019-02-08 VITALS — HR 64 | Resp 12

## 2019-02-08 DIAGNOSIS — R112 Nausea with vomiting, unspecified: Secondary | ICD-10-CM

## 2019-02-08 DIAGNOSIS — B349 Viral infection, unspecified: Secondary | ICD-10-CM | POA: Diagnosis not present

## 2019-02-08 DIAGNOSIS — R05 Cough: Secondary | ICD-10-CM | POA: Diagnosis not present

## 2019-02-08 DIAGNOSIS — R059 Cough, unspecified: Secondary | ICD-10-CM

## 2019-02-08 DIAGNOSIS — R51 Headache: Secondary | ICD-10-CM | POA: Diagnosis not present

## 2019-02-08 DIAGNOSIS — R519 Headache, unspecified: Secondary | ICD-10-CM

## 2019-02-08 MED ORDER — ONDANSETRON 4 MG PO TBDP: 4.0000 mg | ORAL_TABLET | Freq: Three times a day (TID) | ORAL | 0 refills | Status: AC | PRN

## 2019-02-08 MED ORDER — CODEINE-CHLORPHENIRAMINE ER 54.3-8 MG PO TB12: 1.0000 | ORAL_TABLET | Freq: Two times a day (BID) | ORAL | 0 refills | Status: DC | PRN

## 2019-02-08 NOTE — Progress Notes (Signed)
Virtual Visit via Video Note   I connected with Meredith Byrd on 02/09/19 at 12:15 PM EDT by a video enabled telemedicine application and verified that I am speaking with the correct person using two identifiers.  Location patient: home Location provider:work or home office Persons participating in the virtual visit: patient, provider  I discussed the limitations of evaluation and management by telemedicine and the availability of in person appointments. The patient expressed understanding and agreed to proceed.   HPI: Meredith Byrd is a 38 year old female with history of anxiety, depression, insomnia, GERD, chronic pain, and migraines among some; who is complaining about 2 to 3 days of "bad" frontal, sharp, constant headache.  She has not identified exacerbating or alleviating factors. She has history of migraine, follows with neurologist, and takes tramadol as needed. She states that this headache is different to her usual migraine, tramadol and OTC analgesics have not helped. She denies associated visual changes or focal deficit. Yesterday she started with nausea and today she has vomited x4. She denies abdominal pain, blood in the stool, or urinary symptoms. Loose stool x1 today. Temp 99.8 F today.  Negative for changes in appetite or unusual fatigue/myalgias.  Also having nonproductive cough for 2 to 3 days, "little shortness of breath." She tells me that she is not sure if dyspnea is related to her "cardiac arrhythmia", later during visit she tells me that this shortness of breath sensation is completely different today when she has during episodes of arrhythmias, which is chronic. Denies associated chest pain or syncope.  Mild rhinorrhea. Denies nasal congestion, postnasal drainage, or sore throat.  She is requesting test for COVID-19.  According to patient, his sister, who is a Engineer, civil (consulting) at [facility], told her that testing was available there.  ROS: See pertinent positives and  negatives per HPI. COVID-19 screening questions: Denies possible exposure to COVID-19. Negative for loss in the sense of smell or taste.  Past Medical History:  Diagnosis Date  . Allergy   . Anemia   . Anxiety   . Arthritis pain 08/20/2014  . Back pain   . GERD (gastroesophageal reflux disease)   . History of stress test 02/2011 ([facility])   there was no evidence of ischemia, but she only went 3 1/2 minutes on the treadmill making it very difficult to get a good accurate assessment, however  . Hx of echocardiogram    The echocadiogram was essentially normal with the exception of mild mitral calcification and borderline concentric LVH, which in the setting of her hypertension at this early age is something that mean her blood pressure is well controlled.   . Hypertension   . Iron deficiency anemia, unspecified 07/09/2014  . Migraines   . Palpitations   . Swelling   . Vitamin B12 deficiency 03/17/2016    Past Surgical History:  Procedure Laterality Date  . BREAST SURGERY    . CHOLECYSTECTOMY    . GASTRIC BYPASS  2008  . OOPHORECTOMY Right 2010    Family History  Problem Relation Age of Onset  . Stroke Maternal Grandmother   . Heart disease Maternal Grandmother   . Hypertension Maternal Grandmother   . Hyperlipidemia Maternal Grandmother   . Asthma Mother   . Heart disease Mother   . Diabetes Mother   . Early death Mother   . Hypertension Mother   . Thyroid disease Mother   . Anxiety disorder Mother   . Hypertension Father   . Heart disease Father     Social  History   Socioeconomic History  . Marital status: Married    Spouse name: Damon  . Number of children: 4  . Years of education: Associates  . Highest education level: Not on file  Occupational History  . Occupation: Human resources officer  Social Needs  . Financial resource strain: Not on file  . Food insecurity:    Worry: Not on file    Inability: Not on file  . Transportation needs:    Medical: Not on file     Non-medical: Not on file  Tobacco Use  . Smoking status: Never Smoker  . Smokeless tobacco: Never Used  Substance and Sexual Activity  . Alcohol use: No  . Drug use: No  . Sexual activity: Yes  Lifestyle  . Physical activity:    Days per week: Not on file    Minutes per session: Not on file  . Stress: Not on file  Relationships  . Social connections:    Talks on phone: Not on file    Gets together: Not on file    Attends religious service: Not on file    Active member of club or organization: Not on file    Attends meetings of clubs or organizations: Not on file    Relationship status: Not on file  . Intimate partner violence:    Fear of current or ex partner: Not on file    Emotionally abused: Not on file    Physically abused: Not on file    Forced sexual activity: Not on file  Other Topics Concern  . Not on file  Social History Narrative   Right handed.   Lives at home with husband and four children.   No caffeine use.      Current Outpatient Medications:  .  acetaminophen (TYLENOL) 500 MG tablet, Take 2 tablets (1,000 mg total) by mouth every 6 (six) hours as needed., Disp: 30 tablet, Rfl: 0 .  Codeine-Chlorpheniramine ER 54.3-8 MG TB12, Take 1 tablet by mouth 2 (two) times daily as needed for up to 10 days., Disp: 20 tablet, Rfl: 0 .  diazepam (VALIUM) 5 MG tablet, Take 1 by mouth 1 hour  pre-procedure with very light food. May bring 2nd tablet to appointment., Disp: 2 tablet, Rfl: 0 .  medroxyPROGESTERone (DEPO-PROVERA) 150 MG/ML injection, medroxyprogesterone 150 mg/mL intramuscular suspension  INJECT 1 ML EVERY 3 MONTHS BY INTRAMUSCULAR ROUTE., Disp: , Rfl:  .  omeprazole (PRILOSEC) 40 MG capsule, TAKE 1 CAPSULE BY MOUTH EVERY DAY, Disp: 90 capsule, Rfl: 1 .  ondansetron (ZOFRAN ODT) 4 MG disintegrating tablet, Take 1 tablet (4 mg total) by mouth every 8 (eight) hours as needed for up to 3 days for nausea or vomiting., Disp: 9 tablet, Rfl: 0 .  sertraline (ZOLOFT) 50  MG tablet, Take 1 tablet (50 mg total) by mouth daily., Disp: 90 tablet, Rfl: 1 .  temazepam (RESTORIL) 30 MG capsule, TAKE 1 CAPSULE BY MOUTH EVERY DAY AT BEDTIME AS NEEDED FOR SLEEP, Disp: 30 capsule, Rfl: 3 .  tiZANidine (ZANAFLEX) 4 MG tablet, TAKE 1 TABLET BY MOUTH TWICE A DAY AS NEEDED FOR MUSCLE SPASMS (Patient not taking: Reported on 12/13/2018), Disp: 180 tablet, Rfl: 0 .  traMADol (ULTRAM) 50 MG tablet, Take 1 tablet (50 mg total) by mouth as needed for severe pain. (Patient not taking: Reported on 12/13/2018), Disp: 15 tablet, Rfl: 5 .  traMADol (ULTRAM) 50 MG tablet, Take 1 tablet (50 mg total) by mouth every 12 (twelve) hours as needed.,  Disp: 20 tablet, Rfl: 0 .  tranexamic acid (LYSTEDA) 650 MG TABS tablet, TAKE 2 TABLETS BY MOUTH 3 TIMES A DAY FOR 5 DAYS, Disp: , Rfl: 11 .  Vitamin D, Ergocalciferol, (DRISDOL) 1.25 MG (50000 UT) CAPS capsule, TAKE 1 CAPSULE EVERY FOURTH DAY., Disp: 12 capsule, Rfl: 1 .  zonisamide (ZONEGRAN) 100 MG capsule, Take 1 capsule (100 mg total) by mouth 2 (two) times daily., Disp: 60 capsule, Rfl: 11  EXAM:  VITALS per patient if applicable:Pulse 64   Resp 12   GENERAL: alert, oriented, appears well and in no acute distress  HEENT: atraumatic, conjunttiva clear, no obvious facial abnormalities on inspection.  NECK: otherwise normal movements of the head and neck  LUNGS: on inspection no signs of respiratory distress, breathing rate appears normal, no obvious gross SOB, gasping or wheezing  CV: no obvious cyanosis  Meredith: moves all visible extremities without noticeable abnormality  PSYCH/NEURO: pleasant and cooperative, no obvious depression,she is anxious,blunt.Speech and thought processing grossly intact. No focal deficit appreciated.  ASSESSMENT AND PLAN:  Discussed the following assessment and plan:  Headache in front of head Could be related to migraines or to acute process. No signs of neurologic deficit. I do not think imaging at this  time. Recommend following with neurologist if this headache does not improved or to look for immediate medical attention if it gets worse.  Viral illness Hx suggest a viral illness,given current COVID-19 pandemia,this needs to be considered. Recommend CXR, which can be arranged at the office. Demanding COVID 19 test. Explained that at this time we do not have available test.  She is upset.  She states that her sister; who works at [facility] told her that testing is available and they have been testing patients. So recommend going to the ER but she refused. She wants to proceed with chest x-ray.  She was clearly instructed about warning signs. 14 days quarantine, including social distancing from family members at home.  Cough  She would like a Rx for codeine ,side effects discussed. Explained that cough can last a few weeks after acute symptoms resolved.  - Plan: DG Chest 2 View, Codeine-Chlorpheniramine ER 54.3-8 MG TB12  Nausea and vomiting in adult  Adequate hydration. Symptomatic treatment with Zofran. Instructed about warning signs.  - Plan: ondansetron (ZOFRAN ODT) 4 MG disintegrating tablet   I discussed the assessment and treatment plan with the patient. She was provided an opportunity to ask questions and all were answered. The patient agreed with the plan and demonstrated an understanding of the instructions.   The patient was advised to call back or seek an in-person evaluation if the symptoms worsen or if the condition fails to improve as anticipated.  Return if symptoms worsen or fail to improve.    Meredith Gittens [place]Jordan, MD

## 2019-02-08 NOTE — Telephone Encounter (Signed)
Copied from CRM [id number]. Topic: General - Other >> Feb 08, 2019  3:07 PM Tamela Oddi wrote: Reason for CRM: Patient called to speak with Fausto Skillern.  She stated that she had some more information to give her.  Fausto Skillern was on another call. Please call patient back at [phone]

## 2019-02-08 NOTE — Telephone Encounter (Signed)
Called pt and instructed pt to pull around to the back of the building and call office when she had arrived. Pt states she would bring her own mask. Pt states she will be in a black toyota carolla. Pt would like for work note to be faxed to her work at [employer] attention [name] at [phone]

## 2019-02-08 NOTE — Telephone Encounter (Signed)
Returned call to patient and she wanted to make sure I received her message about the work note. Patient informed that note is completed and faxed to job.

## 2019-02-08 NOTE — Telephone Encounter (Signed)
Work note completed and faxed and as requested. Patient informed.

## 2019-02-09 ENCOUNTER — Encounter: Payer: Self-pay | Admitting: Family Medicine

## 2019-02-09 ENCOUNTER — Ambulatory Visit (INDEPENDENT_AMBULATORY_CARE_PROVIDER_SITE_OTHER): Payer: BLUE CROSS/BLUE SHIELD

## 2019-02-09 ENCOUNTER — Telehealth: Payer: Self-pay | Admitting: *Deleted

## 2019-02-09 DIAGNOSIS — R059 Cough, unspecified: Secondary | ICD-10-CM

## 2019-02-09 DIAGNOSIS — R05 Cough: Secondary | ICD-10-CM | POA: Diagnosis not present

## 2019-02-09 NOTE — Telephone Encounter (Signed)
Codeine-Chlorpheniramine ER tablets sent to pharmacy. No longer manufactures tablets, please send in syrup instead.

## 2019-02-10 ENCOUNTER — Other Ambulatory Visit: Payer: Self-pay | Admitting: Family Medicine

## 2019-02-10 MED ORDER — GUAIFENESIN-CODEINE 100-10 MG/5ML PO SOLN: 5.0000 mL | Freq: Two times a day (BID) | ORAL | 0 refills | Status: AC | PRN

## 2019-02-10 NOTE — Telephone Encounter (Signed)
Cough syrup sent to her pharmacy. Thanks, BJ

## 2019-02-15 ENCOUNTER — Telehealth: Payer: Self-pay | Admitting: *Deleted

## 2019-02-15 NOTE — Telephone Encounter (Signed)
Copied from CRM [id number]. Topic: General - Inquiry >> Feb 15, 2019  1:53 PM Lynne Logan D wrote: Reason for CRM: Pt would like to request Dr. Swaziland order the Covid-19 antibody test that [facility] is performing. Please advise.

## 2019-02-15 NOTE — Telephone Encounter (Signed)
Message sent to Dr. Jordan for review and approval. 

## 2019-02-17 ENCOUNTER — Other Ambulatory Visit: Payer: Self-pay | Admitting: Family Medicine

## 2019-02-17 DIAGNOSIS — J069 Acute upper respiratory infection, unspecified: Secondary | ICD-10-CM

## 2019-02-17 DIAGNOSIS — Z0189 Encounter for other specified special examinations: Secondary | ICD-10-CM

## 2019-02-17 NOTE — Telephone Encounter (Signed)
Patient informed, lab appointment scheduled.  

## 2019-02-17 NOTE — Telephone Encounter (Signed)
Lab order placed as requested. Thanks, BJ

## 2019-02-20 ENCOUNTER — Other Ambulatory Visit (INDEPENDENT_AMBULATORY_CARE_PROVIDER_SITE_OTHER): Payer: BLUE CROSS/BLUE SHIELD

## 2019-02-20 ENCOUNTER — Other Ambulatory Visit: Payer: Self-pay

## 2019-02-20 DIAGNOSIS — J069 Acute upper respiratory infection, unspecified: Secondary | ICD-10-CM

## 2019-02-20 DIAGNOSIS — Z0189 Encounter for other specified special examinations: Secondary | ICD-10-CM | POA: Diagnosis not present

## 2019-02-20 NOTE — Addendum Note (Signed)
Addended by: Bonnye Fava on: 02/20/2019 01:55 PM   Modules accepted: Orders

## 2019-02-21 ENCOUNTER — Encounter: Payer: Self-pay | Admitting: Family Medicine

## 2019-02-21 LAB — SAR COV2 SEROLOGY (COVID19)AB(IGG),IA: SARS CoV2 AB IGG: NEGATIVE

## 2019-03-08 ENCOUNTER — Ambulatory Visit: Payer: BLUE CROSS/BLUE SHIELD | Admitting: Physical Medicine and Rehabilitation

## 2019-03-08 ENCOUNTER — Ambulatory Visit: Payer: Self-pay

## 2019-03-08 ENCOUNTER — Encounter: Payer: Self-pay | Admitting: Physical Medicine and Rehabilitation

## 2019-03-08 ENCOUNTER — Other Ambulatory Visit: Payer: Self-pay

## 2019-03-08 VITALS — BP 117/65 | HR 81

## 2019-03-08 DIAGNOSIS — M47816 Spondylosis without myelopathy or radiculopathy, lumbar region: Secondary | ICD-10-CM | POA: Diagnosis not present

## 2019-03-08 MED ORDER — METHYLPREDNISOLONE ACETATE 80 MG/ML IJ SUSP
80.0000 mg | Freq: Once | INTRAMUSCULAR | Status: AC
  Administered 2019-03-08: 80 mg

## 2019-03-08 NOTE — Progress Notes (Signed)
Numeric Pain Rating Scale and Functional Assessment Average Pain 10   In the last MONTH (on 0-10 scale) has pain interfered with the following?  1. General activity like being  able to carry out your everyday physical activities such as walking, climbing stairs, carrying groceries, or moving a chair?  Rating(10)   +Driver, -BT, -Dye Allergies. 

## 2019-03-09 NOTE — Progress Notes (Signed)
Meredith CordialRahsheeda M Phillips - 38 y.o. female MRN [id number]  Date of birth: 28-Sep-1981  Office Visit Note: Visit Date: 03/08/2019 PCP: SwazilandJordan, Betty G, MD Referred by: SwazilandJordan, Betty G, MD  Subjective: Chief Complaint  Patient presents with  . Lower Back - Pain   HPI:  Meredith CordialRahsheeda M Pridgeon is a 38 y.o. female who comes in today At the request of Dr. Annell GreeningMark Yates for bilateral L4-5 facet joint blocks diagnostically hopefully therapeutically.  She is been having chronic worsening low back pain with referral to both hips and actually she reports some referral even down into the calf.  MRI was completed after failure of conservative care and this showed pretty significant arthritis of the facet joints at L4-5 bilaterally but without much in the way of stenosis either foraminal either centrally and no focal nerve compression.  We will complete diagnostic hopefully therapeutic facet joint blocks.  ROS Otherwise per HPI.  Assessment & Plan: Visit Diagnoses:  1. Spondylosis without myelopathy or radiculopathy, lumbar region     Plan: No additional findings.   Meds & Orders:  Meds ordered this encounter  Medications  . methylPREDNISolone acetate (DEPO-MEDROL) injection 80 mg    Orders Placed This Encounter  Procedures  . Facet Injection  . XR C-ARM NO REPORT    Follow-up: Return if symptoms worsen or fail to improve.   Procedures: No procedures performed  Lumbar Facet Joint Intra-Articular Injection(s) with Fluoroscopic Guidance  Patient: Meredith Byrd      Date of Birth: 28-Sep-1981 MRN: [id number] PCP: SwazilandJordan, Betty G, MD      Visit Date: 03/08/2019   Universal Protocol:    Date/Time: 03/08/2019  Consent Given By: the patient  Position: PRONE   Additional Comments: Vital signs were monitored before and after the procedure. Patient was prepped and draped in the usual sterile fashion. The correct patient, procedure, and site was verified.   Injection Procedure Details:   Procedure Site One Meds Administered:  Meds ordered this encounter  Medications  . methylPREDNISolone acetate (DEPO-MEDROL) injection 80 mg     Laterality: Bilateral  Location/Site:  L4-L5  Needle size: 22 guage  Needle type: Spinal  Needle Placement: Articular  Findings:  -Comments: Excellent flow of contrast producing a partial arthrogram.  Procedure Details: The fluoroscope beam is vertically oriented in AP, and the inferior recess is visualized beneath the lower pole of the inferior apophyseal process, which represents the target point for needle insertion. When direct visualization is difficult the target point is located at the medial projection of the vertebral pedicle. The region overlying each aforementioned target is locally anesthetized with a 1 to 2 ml. volume of 1% Lidocaine without Epinephrine.   The spinal needle was inserted into each of the above mentioned facet joints using biplanar fluoroscopic guidance. A 0.25 to 0.5 ml. volume of Isovue-250 was injected and a partial facet joint arthrogram was obtained. A single spot film was obtained of the resulting arthrogram.    One to 1.25 ml of the steroid/anesthetic solution was then injected into each of the facet joints noted above.   Additional Comments:  The patient tolerated the procedure well Dressing: 2 x 2 sterile gauze and Band-Aid    Post-procedure details: Patient was observed during the procedure. Post-procedure instructions were reviewed.  Patient left the clinic in stable condition.    Clinical History: No specialty comments available.     Objective:  VS:  HT:    WT:   BMI:  BP:117/65  HR:81bpm  TEMP: ( )  RESP:  Physical Exam  Ortho Exam Imaging: Xr C-arm No Report  Result Date: 03/08/2019 Please see Notes tab for imaging impression.

## 2019-03-09 NOTE — Procedures (Signed)
Lumbar Facet Joint Intra-Articular Injection(s) with Fluoroscopic Guidance  Patient: Meredith Byrd      Date of Birth: 02/04/1981 MRN: [id number] PCP: [place], Betty G, MD      Visit Date: 03/08/2019   Universal Protocol:    Date/Time: 03/08/2019  Consent Given By: the patient  Position: PRONE   Additional Comments: Vital signs were monitored before and after the procedure. Patient was prepped and draped in the usual sterile fashion. The correct patient, procedure, and site was verified.   Injection Procedure Details:  Procedure Site One Meds Administered:  Meds ordered this encounter  Medications  . methylPREDNISolone acetate (DEPO-MEDROL) injection 80 mg     Laterality: Bilateral  Location/Site:  L4-L5  Needle size: 22 guage  Needle type: Spinal  Needle Placement: Articular  Findings:  -Comments: Excellent flow of contrast producing a partial arthrogram.  Procedure Details: The fluoroscope beam is vertically oriented in AP, and the inferior recess is visualized beneath the lower pole of the inferior apophyseal process, which represents the target point for needle insertion. When direct visualization is difficult the target point is located at the medial projection of the vertebral pedicle. The region overlying each aforementioned target is locally anesthetized with a 1 to 2 ml. volume of 1% Lidocaine without Epinephrine.   The spinal needle was inserted into each of the above mentioned facet joints using biplanar fluoroscopic guidance. A 0.25 to 0.5 ml. volume of Isovue-250 was injected and a partial facet joint arthrogram was obtained. A single spot film was obtained of the resulting arthrogram.    One to 1.25 ml of the steroid/anesthetic solution was then injected into each of the facet joints noted above.   Additional Comments:  The patient tolerated the procedure well Dressing: 2 x 2 sterile gauze and Band-Aid    Post-procedure details: Patient was  observed during the procedure. Post-procedure instructions were reviewed.  Patient left the clinic in stable condition.

## 2019-03-12 ENCOUNTER — Telehealth: Payer: Self-pay

## 2019-03-12 DIAGNOSIS — Z20822 Contact with and (suspected) exposure to covid-19: Secondary | ICD-10-CM

## 2019-03-12 NOTE — Telephone Encounter (Signed)
Unable to contact patient. Non working numbers listed. Unable to notify patient of potential exposure to covid at [facility] visit. UTA for s/sx of covid following exposure.

## 2019-03-12 NOTE — Telephone Encounter (Addendum)
Patient returned call to schedule testing. Order placed. Appt scheduled for 03/13/19 at 8 am at the [facility] site.

## 2019-03-12 NOTE — Addendum Note (Signed)
Addended by: Amado Coe on: 03/12/2019 05:33 PM   Modules accepted: Orders

## 2019-03-12 NOTE — Telephone Encounter (Signed)
Left message requesting the patient to call [facility] for folllow-up infromation from 03/08/19 office visit to [facility].  Left [facility] phone # [phone] for Monday-Friday 7-7.

## 2019-03-12 NOTE — Telephone Encounter (Signed)
RN spoke with the patient regarding the need for Follow-up Corona virus test after exposure during her office visit.  Patinet is unable to come this afternoon and she works from 8-5:30 M-F.  RN suggested that she speak with her employer about arriving late one morning.  The patient asked whether there would be testing next weekend and this RN explained that there is currently no clinics scheduled for next weekend.  The patient stated that she would call back later in the week to check about weekend clinics.

## 2019-03-13 ENCOUNTER — Telehealth: Payer: Self-pay | Admitting: *Deleted

## 2019-03-13 ENCOUNTER — Other Ambulatory Visit: Payer: BLUE CROSS/BLUE SHIELD

## 2019-03-13 DIAGNOSIS — Z20822 Contact with and (suspected) exposure to covid-19: Secondary | ICD-10-CM

## 2019-03-13 NOTE — Telephone Encounter (Signed)
Copied from CRM [id number]. Topic: General - Inquiry >> Mar 13, 2019  8:56 AM Reggie Pile, NT wrote: Reason for CRM: Patient is calling in stating she would like a call back from Dr.Jordan's nurse to discuss COVID-19. Call back is [phone].

## 2019-03-14 ENCOUNTER — Telehealth: Payer: Self-pay

## 2019-03-14 LAB — NOVEL CORONAVIRUS, NAA: SARS-CoV-2, NAA: NOT DETECTED

## 2019-03-14 NOTE — Telephone Encounter (Signed)
I called patient and advised. Note faxed per her request.

## 2019-03-14 NOTE — Telephone Encounter (Signed)
I spoke with patient. 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  Please write note for patient stating the date that she was seen in the office and that she had a low risk exposure while she was in our office for her appointment.  Please also include in note that she should remain out of work until results are received.  Please call her once note is done.

## 2019-03-14 NOTE — Telephone Encounter (Signed)
Patient would like a work note faxed to [phone] attn: [name] stating that she may have been exposed to COVID-19 at our office.  Patient stated that she has not received her test results back.  Cb# is [phone].  Please advise.  Thank you.

## 2019-03-14 NOTE — Telephone Encounter (Signed)
I called and spoke with patient. She needs for note to state the last day that she was in the office and that she was exposed by positive COVID employees in the back, as this is what she was told when she was called to discuss testing for herself. This is what she explained to her employer.  She will need to be out of work until her test results are received.  Please fax to number below or send back to me and I will fax. Thanks.

## 2019-03-15 NOTE — Telephone Encounter (Signed)
Left message to return call 

## 2019-03-21 NOTE — Telephone Encounter (Signed)
Left messages for patient to return call to office. Patient hasn't returned calls. Nothing further needed at this time.

## 2019-03-29 ENCOUNTER — Telehealth: Payer: Self-pay | Admitting: *Deleted

## 2019-03-29 NOTE — Telephone Encounter (Signed)
Called pt and lvm #1 to get pt scheduled for Rpt Bil L4-5 facet.

## 2019-04-07 ENCOUNTER — Other Ambulatory Visit: Payer: Self-pay | Admitting: Family Medicine

## 2019-04-07 DIAGNOSIS — G4700 Insomnia, unspecified: Secondary | ICD-10-CM

## 2019-05-10 ENCOUNTER — Telehealth: Payer: Self-pay | Admitting: Neurology

## 2019-05-10 NOTE — Telephone Encounter (Signed)
Spoke to patient - she is experiencing a migraine that has been unresponsive to home remedies.  She would like to come in for an infusion.    Per vo by Dr. Krista Blue, she may have the following IV medications:  1) Depacon 1000mg  IV 2) Toradol 30mg  IV  The patient was instructed to head to the office now.  She has also scheduled a follow up with our clinic.  Signed MD orders provided to [facility].

## 2019-05-10 NOTE — Telephone Encounter (Signed)
Pt states that she was informed by the [facility] suite to call the RN. Please advise.

## 2019-05-12 ENCOUNTER — Ambulatory Visit: Payer: BLUE CROSS/BLUE SHIELD

## 2019-05-15 ENCOUNTER — Telehealth: Payer: Self-pay | Admitting: Family Medicine

## 2019-05-15 ENCOUNTER — Other Ambulatory Visit: Payer: Self-pay | Admitting: Family Medicine

## 2019-05-15 DIAGNOSIS — G4700 Insomnia, unspecified: Secondary | ICD-10-CM

## 2019-05-15 NOTE — Telephone Encounter (Signed)
Copied from [place] [id number]. Topic: Quick Communication - Rx Refill/Question >> May 15, 2019 12:28 PM Leward Quan A wrote: Medication: temazepam (RESTORIL) 30 MG capsule   Has the patient contacted their pharmacy? Yes.   (Agent: If no, request that the patient contact the pharmacy for the refill.) (Agent: If yes, when and what did the pharmacy advise?)  Preferred Pharmacy (with phone number or street name): [id number] - [place], [place] - [address] [phone] (Phone) [phone] (Fax)    Agent: Please be advised that RX refills may take up to 3 business days. We ask that you follow-up with your pharmacy.

## 2019-05-15 NOTE — Telephone Encounter (Signed)
Okay for refill? Pt last OV was 02/08/2019 Acute issue Pt last refill was 04/10/2019 No refills  Please advise

## 2019-05-16 NOTE — Telephone Encounter (Signed)
She is due for f/u. Thanks, BJ

## 2019-05-17 ENCOUNTER — Encounter: Payer: Self-pay | Admitting: Neurology

## 2019-05-17 ENCOUNTER — Other Ambulatory Visit: Payer: Self-pay

## 2019-05-17 ENCOUNTER — Ambulatory Visit (INDEPENDENT_AMBULATORY_CARE_PROVIDER_SITE_OTHER): Payer: BC Managed Care – PPO | Admitting: Neurology

## 2019-05-17 VITALS — BP 128/72 | HR 76 | Temp 98.0°F | Ht 60.0 in | Wt 197.0 lb

## 2019-05-17 DIAGNOSIS — F5104 Psychophysiologic insomnia: Secondary | ICD-10-CM | POA: Insufficient documentation

## 2019-05-17 DIAGNOSIS — G43109 Migraine with aura, not intractable, without status migrainosus: Secondary | ICD-10-CM

## 2019-05-17 MED ORDER — CLONAZEPAM 0.5 MG PO TABS: 0.5000 mg | ORAL_TABLET | Freq: Two times a day (BID) | ORAL | 1 refills | Status: DC | PRN

## 2019-05-17 MED ORDER — ZONISAMIDE 100 MG PO CAPS: 100.0000 mg | ORAL_CAPSULE | Freq: Two times a day (BID) | ORAL | 4 refills | Status: DC

## 2019-05-17 MED ORDER — EMGALITY 120 MG/ML ~~LOC~~ SOAJ: SUBCUTANEOUS | 11 refills | Status: DC

## 2019-05-17 MED ORDER — UBRELVY 50 MG PO TABS: 50.0000 mg | ORAL_TABLET | ORAL | 11 refills | Status: DC | PRN

## 2019-05-17 NOTE — Telephone Encounter (Signed)
Returned call to patient and she wanted to know if PCP would go ahead and send in medication. Patient scheduled for 05/31/2019 for follow-up. Please advise

## 2019-05-17 NOTE — Telephone Encounter (Signed)
Pt has been scheduled an would like to have a call back before the end of day if possible.

## 2019-05-17 NOTE — Progress Notes (Signed)
[facility]  PATIENT: Meredith Byrd DOB: 40-Jun-1982  HISTORY OF PRESENT ILLNESS: Meredith M Deandradeis 38 years old right-handed African-American female, is referred by her primary care physician nurse practitioner Marva PandaKimberly Millsaps for evaluation of migraine headaches.Initial evaluation was in February 2016.  She had long-standing history of migraine, since elementary school, her typical migraine are lateralized severe pounding headache with associated light noise sensitivity, nauseous, can last for few days, even after few weeks. She had a history of gastric bypass, lost 60 pounds in 2008, her weight has been stable  I saw her previously, while taking Topamax, her headache is much less frequent, less severe, but she has not take any preventive medications since 2014.  She now complains of increased headache frequency, since 2014, she is having 3-4 typical migraine headaches each week, each episode last for a few days, in January, she has one severe episode of migraine lasting for 2 weeks. She was not able to identify triggers, she has tried triptan treatment in the past, Imitrex, cause heart racing, tightness, Maxalt, similar side effect, without helping her headache much.  Over the past 18 months, she was given Stadol nasal spray for headaches, she gets 3 bottles of 10 mg Stadol nasal spray each month, this was prescribed [facility] her [facility], but always has recurrent headaches shortly afterwards.  UPDATE March 24th 2016: She reported only temporary partial relief with previous nerve block in November 23 2014, she had tried Zomig nasal spray 10 times, without helping her headaches, Previously Preventive medications, Topamax works for her headaches, but she is trying to conceiving again, does not want to take too much medications, I have went over other possibilities including Inderal, nortriptyline, all belongs to  category C, I also advised her avoiding triptan, and Stadol treatment during pregnancy,  She had 4 children, no significant headache during her previous pregnancy  UPDATE Feb 09 2017: She continue complains of frequent migraines about 10-15 days each month, sometimes preceded by visual distortion, usually short lasting, but sometimes she woke up with severe migraine headache, last visit was in March 2016, over the past 2 years, she was managed by her primary care physician, per patient, she was given prescription of Stadol, tramadol, but we were not able to find her refill information from [facility].  She states she has tried and failed multiple preventative medications, Topamax,cause too much memory issues, almost lost her job,  For abortive treatment she has tried Maxalt, Zomig, sumatriptan, none of above works for her, some of the medication even made it more intense.   UPDATE Oct 28 2017: She continue have 15-20 migraine headaches days each month, she has tried different preventive medications, Topamax, nortriptyline, Inderal, without helping her headaches,  She has suboptimal response to triptan treatment, imitrex, zomig, relpax, maxalt. 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,   UPDATE May 17 2019: She continues to have frequent headaches, 12-15 each month, lateralized severe pounding  headache was associated light, noise sensitivity, nauseous, previously triptan was  not helpful, in addition, she complains of has tried trazodone, nortriptyline,temazepam, Ambien not helping her sleep, she had sleep study in November 2018, there was no evidence of obstructive sleep apnea, previous Emgality was helping her migraine some, she was taking tramadol as needed, wants to try to Ubrevyl this time   REVIEW OF SYSTEMS: Full 14 system review of systems performed and notable only for those listed, all others are neg:  As above   ALLERGIES: Allergies  Allergen Reactions  . Fioricet [Butalbital-Apap-Caffeine] Other (See Comments)    GI upset  . Inderal [Propranolol] Nausea Only    Dropped BP too low    HOME MEDICATIONS: Outpatient Medications Prior to Visit  Medication Sig Dispense Refill  . acetaminophen (TYLENOL) 500 MG tablet Take 2 tablets (1,000 mg total) by mouth every 6 (six) hours as needed. 30 tablet 0  . diazepam (VALIUM) 5 MG tablet Take 1 by mouth 1 hour  pre-procedure with very light food. May bring 2nd tablet to appointment. 2 tablet 0  . medroxyPROGESTERone (DEPO-PROVERA) 150 MG/ML injection medroxyprogesterone 150 mg/mL intramuscular suspension  INJECT 1 ML EVERY 3 MONTHS BY INTRAMUSCULAR ROUTE.    [name]. omeprazole (PRILOSEC) 40 MG capsule TAKE 1 CAPSULE BY MOUTH EVERY DAY 90 capsule 1  . sertraline (ZOLOFT) 50 MG tablet Take 1 tablet (50 mg total) by mouth daily. 90 tablet 1  . temazepam (RESTORIL) 30 MG capsule TAKE 1 CAPSULE BY MOUTH EVERY DAY AT BEDTIME AS NEEDED FOR SLEEP 30 capsule 0  . tiZANidine (ZANAFLEX) 4 MG tablet TAKE 1 TABLET BY MOUTH TWICE A DAY AS NEEDED FOR MUSCLE SPASMS 180 tablet 0  . traMADol (ULTRAM) 50 MG tablet Take 1 tablet (50 mg total) by mouth as needed for severe pain. 15 tablet 5  . traMADol (ULTRAM) 50 MG tablet Take 1 tablet (50 mg total) by mouth every 12 (twelve) hours as needed. 20 tablet 0  . tranexamic acid (LYSTEDA) 650 MG TABS tablet TAKE 2 TABLETS BY MOUTH 3 TIMES A DAY FOR 5 DAYS  11  . Vitamin D, Ergocalciferol,  (DRISDOL) 1.25 MG (50000 UT) CAPS capsule TAKE 1 CAPSULE EVERY FOURTH DAY. 12 capsule 1  . zonisamide (ZONEGRAN) 100 MG capsule Take 1 capsule (100 mg total) by mouth 2 (two) times daily. 60 capsule 11   No facility-administered medications prior to visit.     PAST MEDICAL HISTORY: Past Medical History:  Diagnosis Date  . Allergy   . Anemia   . Anxiety   . Arthritis pain 08/20/2014  . Back pain   . GERD (gastroesophageal reflux disease)   . History of stress test 02/2011 ([facility])   there was no evidence of ischemia, but she only went 3 1/2 minutes on the treadmill making it very difficult to get a good accurate assessment, however  . Hx of echocardiogram    The echocadiogram was essentially normal with the exception of mild mitral calcification and borderline concentric LVH, which in the setting of her hypertension at this early age is something that mean her blood pressure is well controlled.   . Hypertension   . Iron deficiency anemia, unspecified 07/09/2014  . Migraines   . Palpitations   . Swelling   . Vitamin B12 deficiency 03/17/2016    PAST SURGICAL HISTORY: Past Surgical History:  Procedure Laterality Date  . BREAST SURGERY    . CHOLECYSTECTOMY    . GASTRIC BYPASS  2008  .  OOPHORECTOMY Right 2010    FAMILY HISTORY: Family History  Problem Relation Age of Onset  . Stroke Maternal Grandmother   . Heart disease Maternal Grandmother   . Hypertension Maternal Grandmother   . Hyperlipidemia Maternal Grandmother   . Asthma Mother   . Heart disease Mother   . Diabetes Mother   . Early death Mother   . Hypertension Mother   . Thyroid disease Mother   . Anxiety disorder Mother   . Hypertension Father   . Heart disease Father     SOCIAL HISTORY: Social History   Socioeconomic History  . Marital status: Married    Spouse name: Damon  . Number of children: 4  . Years of education: Associates  . Highest education level: Not on file  Occupational History  .  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 Financial resource strain: Not on file  . Food insecurity    Worry: Not on file    Inability: Not on file  . Transportation needs    Medical: Not on file    Non-medical: Not on file  Tobacco Use  . Smoking status: Never Smoker  . Smokeless tobacco: Never Used  Substance and Sexual Activity  . Alcohol use: No  . Drug use: No  . Sexual activity: Yes  Lifestyle  . Physical activity    Days per week: Not on file    Minutes per session: Not on file  . Stress: Not on file  Relationships  . Social Musicianconnections    Talks on phone: Not on file    Gets together: Not on file    Attends religious service: Not on file    Active member of club or organization: Not on file    Attends meetings of clubs or organizations: Not on file    Relationship status: Not on file  . Intimate partner violence    Fear of current or ex partner: Not on file    Emotionally abused: Not on file    Physically abused: Not on file    Forced sexual activity: Not on file  Other Topics Concern  . Not on file  Social History Narrative   Right handed.   Lives at home with husband and four children.   No caffeine use.     PHYSICAL EXAM  Vitals:   05/17/19 1609  BP: 128/72  Pulse: 76  Temp: 98 F (36.7 C)  Weight: 197 lb (89.4 kg)  Height: 5' (1.524 m)   Body mass index is 38.47 kg/m.  Generalized: Well developed, in no acute distress  Head: normocephalic and atraumatic,. Oropharynx benign  Neck: Supple, no carotid bruits  Cardiac: Regular rate rhythm, no murmur  Musculoskeletal: No deformity   Neurological examination   Mentation: Alert oriented to time, place, history taking. Attention span and concentration appropriate. Recent and remote memory intact.  Follows all commands speech and language fluent.   Cranial nerve II-XII: Pupils were equal round reactive to light extraocular movements were full, visual field were full on confrontational test.  Facial sensation and strength were normal. hearing was intact to finger rubbing bilaterally. Uvula tongue midline. head turning and shoulder shrug were normal and symmetric.Tongue protrusion into cheek strength was normal. Motor: normal bulk and tone, full strength in the BUE, BLE, fine finger movements normal, no pronator drift. No focal weakness Sensory: normal and symmetric to light touch, pinprick, and  Vibration,in the upper and lower extremities  Coordination: finger-nose-finger, heel-to-shin bilaterally, no dysmetria Reflexes: symmetric  upper and lower plantar responses were flexor bilaterally. Gait and Station: Rising up from seated position without assistance, normal stance,  moderate stride, good arm swing, smooth turning, able to perform tiptoe, and heel walking without difficulty. Tandem gait is steady  DIAGNOSTIC DATA (LABS, IMAGING, TESTING) - I reviewed patient records, labs, notes, testing and imaging myself where available.  Lab Results  Component Value Date   WBC 6.1 06/29/2017   HGB 10.8 (L) 06/29/2017   HCT 35.5 06/29/2017   MCV 75 (L) 06/29/2017   PLT 440.0 (H) 01/19/2017      Component Value Date/Time   NA 136 06/29/2017 0917   K 4.6 06/29/2017 0917   CL 100 06/29/2017 0917   CO2 23 06/29/2017 0917   GLUCOSE 85 06/29/2017 0917   GLUCOSE 83 01/19/2017 0857   BUN 7 06/29/2017 0917   CREATININE 0.74 06/29/2017 0917   CREATININE 0.65 03/22/2014 1217   CALCIUM 9.3 06/29/2017 0917   PROT 6.7 06/29/2017 0917   ALBUMIN 3.8 06/29/2017 0917   AST 21 06/29/2017 0917   ALT 32 06/29/2017 0917   ALKPHOS 72 06/29/2017 0917   BILITOT 0.4 06/29/2017 0917   GFRNONAA 104 06/29/2017 0917   GFRNONAA >89 03/22/2014 1217   GFRAA 120 06/29/2017 0917   GFRAA >89 03/22/2014 1217   Lab Results  Component Value Date   CHOL 109 06/29/2017   HDL 46 06/29/2017   LDLCALC 47 06/29/2017   TRIG 81 06/29/2017   CHOLHDL 2 01/19/2017    Lab Results  Component Value Date   TSH  0.708 06/29/2017    ASSESSMENT AND PLAN  38 y.o. year old female    Chronic migraine headaches  Tried and failed multiple preventive medications in the past, this include Topamax, Inderal, nortriptyline, calcium channel blocker  Suboptimal response to triptan treatment, complaints of side effect, including low blood pressure, heart palpitation,   15 tablets of tramadol make a notation just to 15 tablets  Continue CGRP antagonism emagality  Keep zonegran 100 mg twice a day, potential side effect was explained to her  [place] as needed,  tramadol as needed (we will only gave her 15 tablets each month)  Chronic insomnia  Tried and failed multiple different medications, includes Ambien, temazepam, trazodone  I have suggested her to add on clonazepam 0.5 mg every night  Marcial Pacas, M.D. Ph.D.  [facility] [place], [place] [address] Phone: [phone] Fax:      [phone]

## 2019-05-17 NOTE — Telephone Encounter (Signed)
Left detailed message for patient to schedule f/u for medication.

## 2019-05-19 ENCOUNTER — Other Ambulatory Visit: Payer: Self-pay

## 2019-05-19 ENCOUNTER — Ambulatory Visit
Admission: RE | Admit: 2019-05-19 | Discharge: 2019-05-19 | Disposition: A | Discharge status: Home / Self Care | Payer: BC Managed Care – PPO | Source: Ambulatory Visit | Attending: Family Medicine | Admitting: Family Medicine

## 2019-05-19 DIAGNOSIS — Z1231 Encounter for screening mammogram for malignant neoplasm of breast: Secondary | ICD-10-CM

## 2019-05-19 NOTE — Telephone Encounter (Signed)
Rx sent to her pharmacy. She needs to keep next appt and be sure to continue following as recommended next time.  Thanks, BJ

## 2019-05-22 ENCOUNTER — Other Ambulatory Visit: Payer: Self-pay | Admitting: *Deleted

## 2019-05-22 ENCOUNTER — Telehealth: Payer: Self-pay | Admitting: Neurology

## 2019-05-22 MED ORDER — EMGALITY 120 MG/ML ~~LOC~~ SOAJ: 120.0000 mg | SUBCUTANEOUS | 11 refills | Status: DC

## 2019-05-22 MED ORDER — EMGALITY 120 MG/ML ~~LOC~~ SOAJ: 240.0000 mg | Freq: Once | SUBCUTANEOUS | 0 refills | Status: AC

## 2019-05-22 NOTE — Telephone Encounter (Signed)
PA submitted and completed through cover my meds/ [employer] and it was approved through 05/22/19-08/13/19

## 2019-05-23 ENCOUNTER — Telehealth: Payer: Self-pay | Admitting: Neurology

## 2019-05-23 NOTE — Telephone Encounter (Signed)
PA completed and submitted through cover my meds/ [employer] for [employer]. [id number] Waiting to hear for approval which can take up to 3 days.

## 2019-05-23 NOTE — Telephone Encounter (Signed)
Noted. Patient aware 

## 2019-05-31 ENCOUNTER — Telehealth (INDEPENDENT_AMBULATORY_CARE_PROVIDER_SITE_OTHER): Payer: BC Managed Care – PPO | Admitting: Family Medicine

## 2019-05-31 ENCOUNTER — Encounter: Payer: Self-pay | Admitting: Family Medicine

## 2019-05-31 ENCOUNTER — Other Ambulatory Visit: Payer: Self-pay

## 2019-05-31 DIAGNOSIS — Z3042 Encounter for surveillance of injectable contraceptive: Secondary | ICD-10-CM | POA: Diagnosis not present

## 2019-05-31 DIAGNOSIS — G4700 Insomnia, unspecified: Secondary | ICD-10-CM | POA: Diagnosis not present

## 2019-05-31 DIAGNOSIS — F411 Generalized anxiety disorder: Secondary | ICD-10-CM | POA: Diagnosis not present

## 2019-05-31 DIAGNOSIS — D508 Other iron deficiency anemias: Secondary | ICD-10-CM | POA: Diagnosis not present

## 2019-05-31 MED ORDER — MEDROXYPROGESTERONE ACETATE 150 MG/ML IM SUSP: INTRAMUSCULAR | 3 refills | Status: DC

## 2019-05-31 NOTE — Addendum Note (Signed)
Addended by: [place], Radin Raptis G on: 05/31/2019 02:07 PM   Modules accepted: Orders

## 2019-05-31 NOTE — Progress Notes (Signed)
Virtual Visit via Video Note   I connected with Meredith Byrd on 05/31/19 by a video enabled telemedicine application and verified that I am speaking with the correct person using two identifiers.  Location patient: home Location provider:work office Persons participating in the virtual visit: patient, provider  I discussed the limitations of evaluation and management by telemedicine and the availability of in person appointments. The patient expressed understanding and agreed to proceed.   HPI: Meredith Meredith Byrd is a 38 yo with Hx of anxiety,depression,chronic pain,and migraine following on some of her chronic health problems. She was last seen 02/08/19 for headache (acute visit).  On 05/15/19 and 05/17/19 she called requesting refill on Temazepam at least some to have until her appt. Today she is reporting that temazepam is not helping.  She has not been compliant with follow ups. She was supposed to follow in 02/2019. She was seen for acute visit (headache) 02/08/19.  Chronic back pain,refered to chronic pain management. She decided not to schedule appt. Gabapentin 03/29/17 and 02/2018 Cymbalta to help with back pain and anxiety; discontinued  12/19/18 inquired about specialist to manage insomnia because medication (Temazepam) was not helping. It was recommended for her to schedule appt with psychiatrist.  01/2017 she was on Trazodone 150 mg. 09/2018 Trazodone not helping. Doxepin 10/04/17. Ambien 10 mg did not help. Ambien CR 6.25 mg (11/17/17), 12.5 mg 11/22/17. Ambien 12.5 mg but did not help, 12/10/17. Temazepam 30 mg started 12/10/17.F/U 05/16/18 and 08/22/2018 reported doing well with medication.  Clonazepam 0.5 mg was prescribed by her neurologist to take 1 tablet twice daily as needed (05/17/19) She is not sure about how often she has to take 2 tablets. She states that there is "a lot going on right now", she has to finish homework with her children, so she does not know how often she would need to  take 1 mg.  She states that this medication is helping better than temazepam, so she would like to continue clonazepam. She is not longer taking Zoloft.  She is also requesting refills for Depo Provera,she is due for injection today. She has been on med for contraception for a least 4 years. S/P gastric bypass, did not tolerated iron supplementation. S/P blood transfusion x2 and IV iron infusion.  She has followed with hematologist for iron deficiency anemia. Denies abnormal vaginal bleeding or discharge.   ROS: See pertinent positives and negatives per HPI.  Past Medical History:  Diagnosis Date  . Allergy   . Anemia   . Anxiety   . Arthritis pain 08/20/2014  . Back pain   . GERD (gastroesophageal reflux disease)   . History of stress test 02/2011 ([facility])   there was no evidence of ischemia, but she only went 3 1/2 minutes on the treadmill making it very difficult to get a good accurate assessment, however  . Hx of echocardiogram    The echocadiogram was essentially normal with the exception of mild mitral calcification and borderline concentric LVH, which in the setting of her hypertension at this early age is something that mean her blood pressure is well controlled.   . Hypertension   . Iron deficiency anemia, unspecified 07/09/2014  . Migraines   . Palpitations   . Swelling   . Vitamin B12 deficiency 03/17/2016    Past Surgical History:  Procedure Laterality Date  . BREAST SURGERY    . CHOLECYSTECTOMY    . GASTRIC BYPASS  2008  . OOPHORECTOMY Right 2010    Family History  Problem Relation Age of Onset  . Stroke Maternal Grandmother   . Heart disease Maternal Grandmother   . Hypertension Maternal Grandmother   . Hyperlipidemia Maternal Grandmother   . Breast cancer Maternal Grandmother   . Asthma Mother   . Heart disease Mother   . Diabetes Mother   . Early death Mother   . Hypertension Mother   . Thyroid disease Mother   . Anxiety disorder Mother   .  Hypertension Father   . Heart disease Father   . Breast cancer Maternal Aunt        under 65  . Breast cancer Paternal Aunt     Social History   Socioeconomic History  . Marital status: Married    Spouse name: Damon  . Number of children: 4  . Years of education: Associates  . Highest education level: Not on file  Occupational History  . 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 Financial resource strain: Not on file  . Food insecurity    Worry: Not on file    Inability: Not on file  . Transportation needs    Medical: Not on file    Non-medical: Not on file  Tobacco Use  . Smoking status: Never Smoker  . Smokeless tobacco: Never Used  Substance and Sexual Activity  . Alcohol use: No  . Drug use: No  . Sexual activity: Yes  Lifestyle  . Physical activity    Days per week: Not on file    Minutes per session: Not on file  . Stress: Not on file  Relationships  . Social Musicianconnections    Talks on phone: Not on file    Gets together: Not on file    Attends religious service: Not on file    Active member of club or organization: Not on file    Attends meetings of clubs or organizations: Not on file    Relationship status: Not on file  . Intimate partner violence    Fear of current or ex partner: Not on file    Emotionally abused: Not on file    Physically abused: Not on file    Forced sexual activity: Not on file  Other Topics Concern  . Not on file  Social History Narrative   Right handed.   Lives at home with husband and four children.   No caffeine use.    Current Outpatient Medications:  .  acetaminophen (TYLENOL) 500 MG tablet, Take 2 tablets (1,000 mg total) by mouth every 6 (six) hours as needed., Disp: 30 tablet, Rfl: 0 .  clonazePAM (KLONOPIN) 0.5 MG tablet, Take 1 tablet (0.5 mg total) by mouth 2 (two) times daily as needed for anxiety., Disp: 30 tablet, Rfl: 1 .  Galcanezumab-gnlm (EMGALITY) 120 MG/ML SOAJ, Inject 120 mg into the skin every 30  (thirty) days., Disp: 1 pen, Rfl: 11 .  medroxyPROGESTERone (DEPO-PROVERA) 150 MG/ML injection, medroxyprogesterone 150 mg/mL intramuscular suspension  INJECT 1 ML EVERY 3 MONTHS BY INTRAMUSCULAR ROUTE., Disp: 1 mL, Rfl: 3 .  omeprazole (PRILOSEC) 40 MG capsule, TAKE 1 CAPSULE BY MOUTH EVERY DAY, Disp: 90 capsule, Rfl: 1 .  tiZANidine (ZANAFLEX) 4 MG tablet, TAKE 1 TABLET BY MOUTH TWICE A DAY AS NEEDED FOR MUSCLE SPASMS, Disp: 180 tablet, Rfl: 0 .  traMADol (ULTRAM) 50 MG tablet, Take 1 tablet (50 mg total) by mouth as needed for severe pain., Disp: 15 tablet, Rfl: 5 .  traMADol (ULTRAM) 50 MG tablet, Take 1 tablet (50 mg  total) by mouth every 12 (twelve) hours as needed., Disp: 20 tablet, Rfl: 0 .  tranexamic acid (LYSTEDA) 650 MG TABS tablet, TAKE 2 TABLETS BY MOUTH 3 TIMES A DAY FOR 5 DAYS, Disp: , Rfl: 11 .  Ubrogepant (UBRELVY) 50 MG TABS, Take 50 mg by mouth as needed. May repeat once in 2 hours, Disp: 12 tablet, Rfl: 11 .  Vitamin D, Ergocalciferol, (DRISDOL) 1.25 MG (50000 UT) CAPS capsule, TAKE 1 CAPSULE EVERY FOURTH DAY., Disp: 12 capsule, Rfl: 1 .  zonisamide (ZONEGRAN) 100 MG capsule, Take 1 capsule (100 mg total) by mouth 2 (two) times daily., Disp: 180 capsule, Rfl: 4  EXAM:  VITALS per patient if applicable:N/A  GENERAL: alert, oriented, appears well and in no acute distress  HEENT: atraumatic, conjunctiva clear, no obvious abnormalities on inspection.  NECK: normal movements of the head and neck  LUNGS: on inspection no signs of respiratory distress, breathing rate appears normal, no obvious gross SOB, gasping or wheezing  CV: no obvious cyanosis  PSYCH/NEURO: Blunt and upset, no obvious depression or anxiety.  ASSESSMENT AND PLAN:  Discussed the following assessment and plan:  Insomnia, unspecified type - Plan: Apparently clonazepam 0.5 mg 1 to 2 tablets at bedtime is helping with her sleep. She has tried several other medications. Even though it was started by a  different provider, I was willing to continue it. Initially she tells me she is not sure if she would need to take 2 tablets often, in which case I would like to change clonazepam from 0.5 mg to 1 mg.  She would like to continue getting clonazepam 0.5 mg, 60 tablets, regardless of how often she takes medication.  She is upset, "why you do not just prescribe it as Dr.Yan did."  After conversation + not compliance with follow-ups, I do not feel comfortable continue prescribing clonazepam at this time. She tells me she is going to continue getting prescription from her neurologist.  I will no longer manage insomnia.  Generalized anxiety disorder - Plan: Anxiety and depression, she has not been compliant with medication. I have recommended before establish with psychiatrist. Insomnia could be part of an other psychotic disorder. She is not interested in discussing this problem.  Other iron deficiency anemia - Plan: She is no longer following with hematologist,not compliant with visits. She has not had a CBC in a while. We have not discussed this problem in the past.  On Depo-Provera for contraception: We discussed some side effects of Depo-Provera. Recommend taking calcium supplementation and engage in regular physical activity for bone health. It seems like she has tolerated medication well and she would like to continue it. F/U in a year with gyn or here. She has follow-up with gynecologist,Dr Massenburg. Last visit I see 2016.   I discussed the assessment and treatment plan with the patient. She was provided an opportunity to ask questions and all were answered. She agreed with the plan and demonstrated an understanding of the instructions.    Follow-up as needed.    Betty Martinique, MD

## 2019-06-01 ENCOUNTER — Telehealth: Payer: Self-pay | Admitting: Family Medicine

## 2019-06-01 NOTE — Telephone Encounter (Signed)
Patient is requesting labs be done for monitoring of her medication.  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

## 2019-06-02 NOTE — Telephone Encounter (Signed)
Message sent to Dr. Jordan for review. 

## 2019-06-02 NOTE — Telephone Encounter (Signed)
To monitor for which medication? Ergocalciferol? This is the only med I am prescribing that needs to be monitored. 25 OH vit D because vit D deficiency.  Thanks, BJ

## 2019-06-05 NOTE — Telephone Encounter (Signed)
Spoke with patient and she state that she just wants to have regular blood work done that would normally be done at the physical. Patient would like to come and have labs done. Patient stated that she cannot take off of work to have CPE done.

## 2019-06-20 ENCOUNTER — Other Ambulatory Visit: Payer: Self-pay | Admitting: Family Medicine

## 2019-06-20 DIAGNOSIS — Z131 Encounter for screening for diabetes mellitus: Secondary | ICD-10-CM

## 2019-06-20 DIAGNOSIS — Z13220 Encounter for screening for lipoid disorders: Secondary | ICD-10-CM

## 2019-06-20 DIAGNOSIS — E538 Deficiency of other specified B group vitamins: Secondary | ICD-10-CM

## 2019-06-20 DIAGNOSIS — E559 Vitamin D deficiency, unspecified: Secondary | ICD-10-CM

## 2019-06-20 NOTE — Telephone Encounter (Signed)
Needs to be scheduled for lab appt

## 2019-06-20 NOTE — Telephone Encounter (Signed)
Lab orders placed as requested. She needs to be fasting.  Thanks, BJ

## 2019-06-21 NOTE — Telephone Encounter (Signed)
LVM for pt to call and schedule appt for fasting labs.

## 2019-07-04 ENCOUNTER — Other Ambulatory Visit: Payer: Self-pay

## 2019-07-04 ENCOUNTER — Other Ambulatory Visit: Payer: Self-pay | Admitting: Neurology

## 2019-07-04 MED ORDER — CLONAZEPAM 1 MG PO TABS: 1.0000 mg | ORAL_TABLET | Freq: Every evening | ORAL | 0 refills | Status: DC | PRN

## 2019-07-04 NOTE — Addendum Note (Signed)
Addended by: Noberto Retort C on: 07/04/2019 10:01 AM   Modules accepted: Orders

## 2019-07-04 NOTE — Telephone Encounter (Signed)
Per vo by Dr. Krista Blue, provide new prescription for clonazepam 1mg , take one tablet at bedtime as needed.  #30 x 0.  The patient should follow up with PCP for her chronic insomnia.  Future refills will need to be managed by PCP.  I have spoken with the patient and she verbalized understanding of this plan.

## 2019-07-04 NOTE — Telephone Encounter (Signed)
Patient has called wanting a refill on her clonazePAM (KLONOPIN) 0.5 MG tablet.

## 2019-07-04 NOTE — Telephone Encounter (Addendum)
Last rx provided on 05/17/2019 for #30 x 1 for clonazepam 0.5mg .  Per [place] narcotic registry, she filled the prescription on 05/17/2019 and 06/07/2019.  The directions state to take 1-2 tablets prn.  I spoke to the patient and she estimates needing 1mg  at least five out seven nights each week for her chronic insomnia.  She also received temazepam 30mg , #12 x 0 on 05/19/2019 (by PCP, Betty Martinique).  States she had one additional refill from a previous prescription and wanted to see which medication worked best.  States she was not taking it at the same time as clonazepam.  Feels clonazepam works best and would like to continue it.

## 2019-07-20 ENCOUNTER — Telehealth: Payer: Self-pay

## 2019-07-20 NOTE — Telephone Encounter (Signed)
Copied from [place] [id number]. Topic: Quick Communication - Rx Refill/Question >> Jul 20, 2019 12:21 PM Carolyn Stare wrote: Medication clonazePAM Bobbye [place]) 1 MG tablet   Rx was being filled by someone else but said  she has spoken to Dr Martinique and she said she would start take over refilling the medication    Preferred Pharmacy [facility]   Agent: Please be advised that RX refills may take up to 3 business days. We ask that you follow-up with your pharmacy.

## 2019-07-21 NOTE — Telephone Encounter (Signed)
Message sent to DR. Martinique for review and approval.

## 2019-07-21 NOTE — Telephone Encounter (Signed)
Spoke with patient and gave recommendations per Dr. Martinique. Patient stated that her neurologist told her that her PCP is supposed prescribe medication. Patient stated that if she did not want to prescribe then she needed to find a new PCP. Patient given number to request for medical records to transfer.

## 2019-07-21 NOTE — Telephone Encounter (Signed)
I am not longer managing medication, I do not feel comfortable continuing controlled medication.  Recommend establishing with psychiatrist as we discussed several months ago. We can provide a list of providers or she can call [facility].  Thanks, BJ

## 2019-07-24 ENCOUNTER — Telehealth: Payer: Self-pay

## 2019-07-24 MED ORDER — CLONAZEPAM 1 MG PO TABS: 1.0000 mg | ORAL_TABLET | Freq: Every evening | ORAL | 3 refills | Status: DC | PRN

## 2019-07-24 NOTE — Telephone Encounter (Signed)
I called the patient.  She is in the process of locating a new PCP.  However, she just started a new job and will be unable to make an appt until after 10/14/2019 when her insurance coverage starts.  She is requesting enough refills of clonazepam 1mg , one tablet at bedtime until she is able to see the new PCP.  The [place] narcotic registry was checked without any issues noted.  Request sent to MD for approval.

## 2019-07-24 NOTE — Telephone Encounter (Signed)
Needs a call back from nurse regarding medication Klonopin. Her primary will not give - started new job benefits have a 60 day waiting period. Needs help getting med. Best cal back is [phone]

## 2019-08-03 NOTE — Telephone Encounter (Signed)
The clonazepam prescription was sent on 07/24/2019.  I called the pharmacy and spoke to [place] who confirmed it was received.  The patient is aware are will go to pick it up.

## 2019-08-03 NOTE — Telephone Encounter (Signed)
Pt asking for a call to know if clonazepam has been called in

## 2019-08-18 ENCOUNTER — Telehealth: Payer: Self-pay | Admitting: *Deleted

## 2019-08-18 NOTE — Telephone Encounter (Signed)
PA for Emgality started on covermymeds (key: [id number]),  Pt has coverage through [employer] [phone]).  Decision pending.

## 2019-08-21 NOTE — Telephone Encounter (Signed)
PA approved through 08/16/2020.  Ref#[id number].

## 2019-09-04 ENCOUNTER — Telehealth: Payer: Self-pay | Admitting: *Deleted

## 2019-09-04 NOTE — Telephone Encounter (Signed)
PA for Ubrelvy started on [email or web address] (key: [id number]).  Approved through 11/26/2019.

## 2019-09-28 ENCOUNTER — Other Ambulatory Visit: Payer: Self-pay | Admitting: Neurology

## 2019-10-11 ENCOUNTER — Other Ambulatory Visit: Payer: Self-pay

## 2019-10-11 DIAGNOSIS — Z20822 Contact with and (suspected) exposure to covid-19: Secondary | ICD-10-CM

## 2019-10-13 LAB — NOVEL CORONAVIRUS, NAA: SARS-CoV-2, NAA: DETECTED — AB

## 2019-10-14 ENCOUNTER — Telehealth (HOSPITAL_COMMUNITY): Payer: Self-pay | Admitting: Critical Care Medicine

## 2019-10-14 NOTE — Telephone Encounter (Signed)
I connected with this patient is Covid positive from December 30.  She has been ill since December 28.  She appears to have low-grade fever, muscle aches, fatigue, cough, headaches and dizziness.  She is contacting her primary care provider for further support.  She is not a monoclonal antibody candidate because her symptom timeframe is beyond the time we would be able to give an infusion for her.  She knows to stay in isolation till this January 8

## 2019-10-16 ENCOUNTER — Encounter (HOSPITAL_COMMUNITY): Payer: Self-pay

## 2019-10-16 ENCOUNTER — Ambulatory Visit (INDEPENDENT_AMBULATORY_CARE_PROVIDER_SITE_OTHER): Payer: PRIVATE HEALTH INSURANCE

## 2019-10-16 ENCOUNTER — Ambulatory Visit (HOSPITAL_COMMUNITY)
Admission: EM | Admit: 2019-10-16 | Discharge: 2019-10-16 | Disposition: A | Discharge status: Home / Self Care | Payer: PRIVATE HEALTH INSURANCE | Attending: Family Medicine | Admitting: Family Medicine

## 2019-10-16 ENCOUNTER — Other Ambulatory Visit: Payer: Self-pay

## 2019-10-16 DIAGNOSIS — U071 COVID-19: Secondary | ICD-10-CM

## 2019-10-16 DIAGNOSIS — R0602 Shortness of breath: Secondary | ICD-10-CM

## 2019-10-16 DIAGNOSIS — R0789 Other chest pain: Secondary | ICD-10-CM

## 2019-10-16 DIAGNOSIS — R05 Cough: Secondary | ICD-10-CM | POA: Diagnosis not present

## 2019-10-16 MED ORDER — BENZONATATE 200 MG PO CAPS: 200.0000 mg | ORAL_CAPSULE | Freq: Three times a day (TID) | ORAL | 0 refills | Status: AC | PRN

## 2019-10-16 MED ORDER — DEXAMETHASONE SODIUM PHOSPHATE 10 MG/ML IJ SOLN
10.0000 mg | Freq: Once | INTRAMUSCULAR | Status: AC
  Administered 2019-10-16: 10 mg via INTRAMUSCULAR

## 2019-10-16 MED ORDER — ALBUTEROL SULFATE HFA 108 (90 BASE) MCG/ACT IN AERS: 1.0000 | INHALATION_SPRAY | Freq: Four times a day (QID) | RESPIRATORY_TRACT | 0 refills | Status: DC | PRN

## 2019-10-16 MED ORDER — DEXAMETHASONE SODIUM PHOSPHATE 10 MG/ML IJ SOLN
INTRAMUSCULAR | Status: AC
  Filled 2019-10-16: qty 1

## 2019-10-16 NOTE — ED Provider Notes (Signed)
[facility]    CSN: [id number] Arrival date & time: 10/16/19  1125      History   Chief Complaint Chief Complaint  Patient presents with  . Shortness of Breath    COVID Positive    HPI Meredith Byrd is a 39 y.o. female history of migraines, previous gastric bypass, GERD, hypertension presenting today for evaluation of of chest pain and shortness of breath in setting of recent Covid positive.  Patient tested positive for Covid approximately 4 days ago.  Her symptoms started the day prior.  She has had some sinus congestion as well as cough.  Over the past 3 days she has had increased chest pain and shortness of breath which she notices at rest.  She is concerned as she notes that she has family history of interstitial lung disease as well as concerned about prior heart murmur as well as LVH.  She has had some GI upset, but attributes this also to her gastric bypass.  She has been using over-the-counter Robitussin and cough drops without relief of symptoms.  Denies prior DVT/PE.  Denies unilateral leg pain/swelling.  Denies exogenous estrogen use.  HPI  Past Medical History:  Diagnosis Date  . Allergy   . Anemia   . Anxiety   . Arthritis pain 08/20/2014  . Back pain   . GERD (gastroesophageal reflux disease)   . History of stress test 02/2011 ([facility])   there was no evidence of ischemia, but she only went 3 1/2 minutes on the treadmill making it very difficult to get a good accurate assessment, however  . Hx of echocardiogram    The echocadiogram was essentially normal with the exception of mild mitral calcification and borderline concentric LVH, which in the setting of her hypertension at this early age is something that mean her blood pressure is well controlled.   . Hypertension   . Iron deficiency anemia, unspecified 07/09/2014  . Migraines   . Palpitations   . Swelling   . Vitamin B12 deficiency 03/17/2016    Patient Active Problem List   Diagnosis Date Noted   . On Depo-Provera for contraception 05/31/2019  . Chronic insomnia 05/17/2019  . Lumbar facet arthropathy 12/14/2018  . Dysmenorrhea, unspecified 05/16/2018  . Knee pain, left 11/17/2017  . Generalized anxiety disorder 10/04/2017  . Morning headache 05/04/2017  . Daytime somnolence 05/04/2017  . Class 3 obesity with body mass index (BMI) of 45.0 to 49.9 in adult 03/28/2017  . Chronic low back pain 01/19/2017  . Vitamin D deficiency 01/18/2017  . Vitamin B12 deficiency 03/17/2016  . Beta thalassemia trait 03/25/2015  . Intractable migraine without aura and without status migrainosus 11/23/2014  . Elevated sed rate 08/20/2014  . Arthritis pain 08/20/2014  . Iron deficiency anemia 07/09/2014  . Symptomatic anemia 06/05/2014  . Chest pain 05/01/2014  . GERD (gastroesophageal reflux disease) 03/22/2014  . S/P gastric bypass 03/22/2014  . Insomnia 03/22/2014  . Migraine 03/22/2014  . Palpitations 03/22/2014    Past Surgical History:  Procedure Laterality Date  . BREAST SURGERY    . CHOLECYSTECTOMY    . GASTRIC BYPASS  2008  . OOPHORECTOMY Right 2010    OB History    Gravida  4   Para  4   Term  4   Preterm      AB      Living        SAB      TAB      Ectopic  Multiple      Live Births               Home Medications    Prior to Admission medications   Medication Sig Start Date End Date Taking? Authorizing Provider  acetaminophen (TYLENOL) 500 MG tablet Take 2 tablets (1,000 mg total) by mouth every 6 (six) hours as needed. 10/31/18   Ricky Gallery C, PA-C  albuterol (VENTOLIN HFA) 108 (90 Base) MCG/ACT inhaler Inhale 1-2 puffs into the lungs every 6 (six) hours as needed for wheezing or shortness of breath. 10/16/19   Joli Koob C, PA-C  benzonatate (TESSALON) 200 MG capsule Take 1 capsule (200 mg total) by mouth 3 (three) times daily as needed for up to 7 days for cough. 10/16/19 10/23/19  Dayan Kreis C, PA-C  clonazePAM (KLONOPIN) 1 MG  tablet Take 1 tablet (1 mg total) by mouth at bedtime as needed (insomnia). Future refills will need to be addressed with PCP. 07/24/19   Marcial Pacas, MD  Galcanezumab-gnlm (EMGALITY) 120 MG/ML SOAJ Inject 120 mg into the skin every 30 (thirty) days. 05/22/19   Marcial Pacas, MD  medroxyPROGESTERone (DEPO-PROVERA) 150 MG/ML injection medroxyprogesterone 150 mg/mL intramuscular suspension  INJECT 1 ML EVERY 3 MONTHS BY INTRAMUSCULAR ROUTE. 05/31/19   [place], Betty G, MD  omeprazole (PRILOSEC) 40 MG capsule TAKE 1 CAPSULE BY MOUTH EVERY DAY 11/07/18   [place], Betty G, MD  tiZANidine (ZANAFLEX) 4 MG tablet TAKE 1 TABLET BY MOUTH TWICE A DAY AS NEEDED FOR MUSCLE SPASMS 09/16/18   [place], Betty G, MD  traMADol (ULTRAM) 50 MG tablet Take 1 tablet (50 mg total) by mouth as needed for severe pain. 02/28/18   Marcial Pacas, MD  traMADol (ULTRAM) 50 MG tablet Take 1 tablet (50 mg total) by mouth every 12 (twelve) hours as needed. 01/16/19   Marybelle Killings, MD  tranexamic acid (LYSTEDA) 650 MG TABS tablet TAKE 2 TABLETS BY MOUTH 3 TIMES A DAY FOR 5 DAYS 04/16/18   [provider]  Ubrogepant (UBRELVY) 50 MG TABS Take 50 mg by mouth as needed. May repeat once in 2 hours 05/17/19   Marcial Pacas, MD  Vitamin D, Ergocalciferol, (DRISDOL) 1.25 MG (50000 UT) CAPS capsule TAKE 1 CAPSULE EVERY FOURTH DAY. 12/12/18   [place], Betty G, MD  zonisamide (ZONEGRAN) 100 MG capsule Take 1 capsule (100 mg total) by mouth 2 (two) times daily. 05/17/19   Marcial Pacas, MD    Family History Family History  Problem Relation Age of Onset  . Stroke Maternal Grandmother   . Heart disease Maternal Grandmother   . Hypertension Maternal Grandmother   . Hyperlipidemia Maternal Grandmother   . Breast cancer Maternal Grandmother   . Asthma Mother   . Heart disease Mother   . Diabetes Mother   . Early death Mother   . Hypertension Mother   . Thyroid disease Mother   . Anxiety disorder Mother   . Hypertension Father   . Heart disease Father   .  Breast cancer Maternal Aunt        under 32  . Breast cancer Paternal Aunt     Social History Social History   Tobacco Use  . Smoking status: Never Smoker  . Smokeless tobacco: Never Used  Substance Use Topics  . Alcohol use: No  . Drug use: No     Allergies   Fioricet [butalbital-apap-caffeine] and Inderal [propranolol]   Review of Systems Review of Systems  Constitutional: Negative for activity change,  appetite change, chills, fatigue and fever.  HENT: Positive for congestion, rhinorrhea and sinus pressure. Negative for ear pain, sore throat and trouble swallowing.   Eyes: Negative for discharge and redness.  Respiratory: Positive for cough and shortness of breath. Negative for chest tightness.   Cardiovascular: Positive for chest pain.  Gastrointestinal: Negative for abdominal pain, diarrhea, nausea and vomiting.  Musculoskeletal: Negative for myalgias.  Skin: Negative for rash.  Neurological: Positive for headaches. Negative for dizziness and light-headedness.     Physical Exam Triage Vital Signs ED Triage Vitals  Enc Vitals Group     BP 10/16/19 1310 123/80     Pulse Rate 10/16/19 1310 92     Resp 10/16/19 1310 16     Temp 10/16/19 1310 98.4 F (36.9 C)     Temp Source 10/16/19 1310 Oral     SpO2 10/16/19 1310 100 %     Weight --      Height --      Head Circumference --      Peak Flow --      Pain Score 10/16/19 1302 7     Pain Loc --      Pain Edu? --      Excl. in [place]? --    No data found.  Updated Vital Signs BP 123/80 (BP Location: Left Wrist)   Pulse 92   Temp 98.4 F (36.9 C) (Oral)   Resp 16   SpO2 100%   Visual Acuity Right Eye Distance:   Left Eye Distance:   Bilateral Distance:    Right Eye Near:   Left Eye Near:    Bilateral Near:     Physical Exam Vitals and nursing note reviewed.  Constitutional:      General: She is not in acute distress.    Appearance: She is well-developed.  HENT:     Head: Normocephalic and  atraumatic.     Ears:     Comments: Bilateral ears without tenderness to palpation of external auricle, tragus and mastoid, EAC's without erythema or swelling, TM's with good bony landmarks and cone of light. Non erythematous.     Mouth/Throat:     Comments: Oral mucosa pink and moist, no tonsillar enlargement or exudate. Posterior pharynx patent and nonerythematous, no uvula deviation or swelling. Normal phonation. Eyes:     Conjunctiva/sclera: Conjunctivae normal.  Cardiovascular:     Rate and Rhythm: Normal rate and regular rhythm.  Pulmonary:     Effort: Pulmonary effort is normal. No respiratory distress.     Breath sounds: Normal breath sounds.     Comments: Breathing comfortably at rest, CTABL, no wheezing, rales or other adventitious sounds auscultated  Anterior chest tenderness notably on the right side Abdominal:     Palpations: Abdomen is soft.     Tenderness: There is no abdominal tenderness.  Musculoskeletal:     Cervical back: Neck supple.     Comments: Bilateral lower legs without swelling or erythema  Skin:    General: Skin is warm and dry.  Neurological:     Mental Status: She is alert.      UC Treatments / Results  Labs (all labs ordered are listed, but only abnormal results are displayed) Labs Reviewed - No data to display  EKG   Radiology DG Chest 2 View  Result Date: 10/16/2019 CLINICAL DATA:  COVID positive. Shortness of breath and chest pain. EXAM: CHEST - 2 VIEW COMPARISON:  Two-view chest x-ray 02/09/2019 FINDINGS: Heart size is normal.  Ill-defined airspace opacities are present at the lung bases, right greater than left. No significant consolidation is present. There is no edema or effusion. Visualized soft tissues and bony thorax are unremarkable. Surgical clips are present at the gallbladder fossa. IMPRESSION: Ill-defined airspace disease at the lung bases, right greater than left. The setting of COVID, this could represent early pneumonia.  Electronically Signed   By: Marin Roberts M.D.   On: 10/16/2019 13:54    Procedures Procedures (including critical care time)  Medications Ordered in UC Medications  dexamethasone (DECADRON) injection 10 mg (10 mg Intramuscular Given 10/16/19 1431)    Initial Impression / Assessment and Plan / UC Course  I have reviewed the triage vital signs and the nursing notes.  Pertinent labs & imaging results that were available during my care of the patient were reviewed by me and considered in my medical decision making (see chart for details).     EKG normal sinus rhythm, no acute signs of ischemia or infarction.  Chest x-ray with possible early pneumonia, no obvious consolidation suggestive of pneumonia.  Given Covid history most likely atypical/viral.  We will continue to monitor.  Providing 10 mg IM Decadron prior to discharge as well as albuterol as needed and Tessalon for cough.  Continue to monitor breathing and discomfort, follow-up here or emergency room if symptoms progressing or worsening.  Discussed CDC recommendations for quarantining.   Discussed strict return precautions. Patient verbalized understanding and is agreeable with plan.  Final Clinical Impressions(s) / UC Diagnoses   Final diagnoses:  COVID-19 virus infection  SOB (shortness of breath)     Discharge Instructions     EKG normal, chest x-ray suggestive of possible early Covid pneumonia We gave you 10 mg of Decadron today Continue to rest and drink plenty of fluids Albuterol inhaler as needed for shortness of breath Tessalon every 8 hours for cough  If you are having worsening chest pain or shortness of breath, difficulty breathing, please follow-up in the emergency room   ED Prescriptions    Medication Sig Dispense Auth. Provider   albuterol (VENTOLIN HFA) 108 (90 Base) MCG/ACT inhaler Inhale 1-2 puffs into the lungs every 6 (six) hours as needed for wheezing or shortness of breath. 8 g Julez Huseby,  Cianna Kasparian C, PA-C   benzonatate (TESSALON) 200 MG capsule Take 1 capsule (200 mg total) by mouth 3 (three) times daily as needed for up to 7 days for cough. 28 capsule Requan Hardge, [place] C, PA-C     PDMP not reviewed this encounter.   Lew Dawes, PA-C 10/16/19 1511

## 2019-10-16 NOTE — ED Triage Notes (Signed)
Patient presents to Urgent Care with complaints of SOB since 2 days ago. Patient reports she needs a chest x-ray.

## 2019-10-16 NOTE — Discharge Instructions (Signed)
EKG normal, chest x-ray suggestive of possible early Covid pneumonia We gave you 10 mg of Decadron today Continue to rest and drink plenty of fluids Albuterol inhaler as needed for shortness of breath Tessalon every 8 hours for cough  If you are having worsening chest pain or shortness of breath, difficulty breathing, please follow-up in the emergency room

## 2019-11-14 ENCOUNTER — Telehealth: Payer: Self-pay | Admitting: Neurology

## 2019-11-14 NOTE — Telephone Encounter (Signed)
Pt called back and was informed of this message and she wants the RN to explain to her why she is no longer under Dr. Zannie Cove care. Please advise.

## 2019-11-14 NOTE — Telephone Encounter (Signed)
Pt called stating that her insurance will not kick in till March 2021 and she is needing RN to send in a script for 2 more months for her clonazePAM (KLONOPIN) 1 MG tablet until she can get a new PCP and insurance. Please advise.

## 2019-11-14 NOTE — Telephone Encounter (Signed)
I spoke to the patient and she knows she that Dr. Terrace Arabia is able to treat her migraines. However, she will need to discuss her long term use of clonazepam with her current PCP. The patient expressed understanding.

## 2019-11-14 NOTE — Telephone Encounter (Addendum)
The patient is under Dr. Zannie Cove care for migraines only. She was last seen 05/17/2019. Due to her previously having insurance issues, Dr. Terrace Arabia provided courtesy refills of her clonazepam on 10/12/202 (#30 x 3 refills). Due to the nature of this medication (controlled substance), Dr. Terrace Arabia has declined to refill again. She will need to discuss the medication with her current physician treating the sleep issues.  I have attempted to call the patient three different times to provide this information. The phone rings several times then just stops. No answer. No voicemail.  If the the patient calls back, please provide the information above.

## 2019-11-20 ENCOUNTER — Encounter: Payer: Self-pay | Admitting: Neurology

## 2019-11-20 ENCOUNTER — Ambulatory Visit: Payer: BC Managed Care – PPO | Admitting: Neurology

## 2019-11-20 ENCOUNTER — Telehealth: Payer: Self-pay | Admitting: *Deleted

## 2019-11-20 NOTE — Telephone Encounter (Signed)
No showed follow up appointment. 

## 2019-11-23 ENCOUNTER — Encounter (HOSPITAL_COMMUNITY): Payer: Self-pay

## 2019-11-23 ENCOUNTER — Other Ambulatory Visit: Payer: Self-pay

## 2019-11-23 ENCOUNTER — Ambulatory Visit (HOSPITAL_COMMUNITY)
Admission: EM | Admit: 2019-11-23 | Discharge: 2019-11-23 | Disposition: A | Discharge status: Home / Self Care | Payer: PRIVATE HEALTH INSURANCE | Attending: Internal Medicine | Admitting: Internal Medicine

## 2019-11-23 ENCOUNTER — Ambulatory Visit (INDEPENDENT_AMBULATORY_CARE_PROVIDER_SITE_OTHER): Payer: PRIVATE HEALTH INSURANCE

## 2019-11-23 DIAGNOSIS — R509 Fever, unspecified: Secondary | ICD-10-CM

## 2019-11-23 DIAGNOSIS — Z8616 Personal history of COVID-19: Secondary | ICD-10-CM | POA: Diagnosis not present

## 2019-11-23 LAB — CBC WITH DIFFERENTIAL/PLATELET
Abs Immature Granulocytes: 0.01 10*3/uL (ref 0.00–0.07)
Basophils Absolute: 0 10*3/uL (ref 0.0–0.1)
Basophils Relative: 0 %
Eosinophils Absolute: 0 10*3/uL (ref 0.0–0.5)
Eosinophils Relative: 0 %
HCT: 33.8 % — ABNORMAL LOW (ref 36.0–46.0)
Hemoglobin: 10.4 g/dL — ABNORMAL LOW (ref 12.0–15.0)
Immature Granulocytes: 0 %
Lymphocytes Relative: 31 %
Lymphs Abs: 1 10*3/uL (ref 0.7–4.0)
MCH: 23.3 pg — ABNORMAL LOW (ref 26.0–34.0)
MCHC: 30.8 g/dL (ref 30.0–36.0)
MCV: 75.6 fL — ABNORMAL LOW (ref 80.0–100.0)
Monocytes Absolute: 0.2 10*3/uL (ref 0.1–1.0)
Monocytes Relative: 6 %
Neutro Abs: 2 10*3/uL (ref 1.7–7.7)
Neutrophils Relative %: 63 %
Platelets: 447 10*3/uL — ABNORMAL HIGH (ref 150–400)
RBC: 4.47 MIL/uL (ref 3.87–5.11)
RDW: 16.8 % — ABNORMAL HIGH (ref 11.5–15.5)
WBC: 3.3 10*3/uL — ABNORMAL LOW (ref 4.0–10.5)
nRBC: 0 % (ref 0.0–0.2)

## 2019-11-23 LAB — BASIC METABOLIC PANEL
Anion gap: 10 (ref 5–15)
BUN: <5 mg/dL — ABNORMAL LOW (ref 6–20)
CO2: 25 mmol/L (ref 22–32)
Calcium: 9 mg/dL (ref 8.9–10.3)
Chloride: 105 mmol/L (ref 98–111)
Creatinine, Ser: 0.67 mg/dL (ref 0.44–1.00)
GFR calc Af Amer: >60 mL/min (ref >60)
GFR calc non Af Amer: >60 mL/min (ref >60)
Glucose, Bld: 100 mg/dL — ABNORMAL HIGH (ref 70–99)
Potassium: 3.3 mmol/L — ABNORMAL LOW (ref 3.5–5.1)
Sodium: 140 mmol/L (ref 135–145)

## 2019-11-23 NOTE — ED Triage Notes (Signed)
Pt c/o fever x45 days after having COVID in December. Also c/o intermittent HA. Denies cough, dysuria sx or other complaint. Had COVID test last Tuesday at [facility] (negative). Has had max temp of 101.2 Monday, states taking tylenol/aceteminophen daily for fever.  Last dose tylenol at 0930

## 2019-11-23 NOTE — ED Provider Notes (Signed)
[facility]    CSN: [id number] Arrival date & time: 11/23/19  1203      History   Chief Complaint Chief Complaint  Patient presents with  . Fever    HPI Meredith Byrd is a 39 y.o. female who recently recovered from COVID-19 infection diagnosed on 10/11/2019 comes to urgent care with complaints of fever over the past several weeks.  Patient's fever did not fully subside.  On further questioning patient was not forthcoming with information when I was asking more questions to decipher the cause of her fever.  She however denies any shortness of breath, cough or sputum production.  No dysuria urgency or frequency.  Only symptom that she volunteered was generalized body aches.   HPI  Past Medical History:  Diagnosis Date  . Allergy   . Anemia   . Anxiety   . Arthritis pain 08/20/2014  . Back pain   . GERD (gastroesophageal reflux disease)   . History of stress test 02/2011 ([facility])   there was no evidence of ischemia, but she only went 3 1/2 minutes on the treadmill making it very difficult to get a good accurate assessment, however  . Hx of echocardiogram    The echocadiogram was essentially normal with the exception of mild mitral calcification and borderline concentric LVH, which in the setting of her hypertension at this early age is something that mean her blood pressure is well controlled.   . Hypertension   . Iron deficiency anemia, unspecified 07/09/2014  . Migraines   . Palpitations   . Swelling   . Vitamin B12 deficiency 03/17/2016    Patient Active Problem List   Diagnosis Date Noted  . On Depo-Provera for contraception 05/31/2019  . Chronic insomnia 05/17/2019  . Lumbar facet arthropathy 12/14/2018  . Dysmenorrhea, unspecified 05/16/2018  . Knee pain, left 11/17/2017  . Generalized anxiety disorder 10/04/2017  . Morning headache 05/04/2017  . Daytime somnolence 05/04/2017  . Class 3 obesity with body mass index (BMI) of 45.0 to 49.9 in adult  03/28/2017  . Chronic low back pain 01/19/2017  . Vitamin D deficiency 01/18/2017  . Vitamin B12 deficiency 03/17/2016  . Beta thalassemia trait 03/25/2015  . Intractable migraine without aura and without status migrainosus 11/23/2014  . Elevated sed rate 08/20/2014  . Arthritis pain 08/20/2014  . Iron deficiency anemia 07/09/2014  . Symptomatic anemia 06/05/2014  . Chest pain 05/01/2014  . GERD (gastroesophageal reflux disease) 03/22/2014  . S/P gastric bypass 03/22/2014  . Insomnia 03/22/2014  . Migraine 03/22/2014  . Palpitations 03/22/2014    Past Surgical History:  Procedure Laterality Date  . BREAST SURGERY    . CHOLECYSTECTOMY    . GASTRIC BYPASS  2008  . OOPHORECTOMY Right 2010    OB History    Gravida  4   Para  4   Term  4   Preterm      AB      Living        SAB      TAB      Ectopic      Multiple      Live Births               Home Medications    Prior to Admission medications   Medication Sig Start Date End Date Taking? Authorizing Provider  acetaminophen (TYLENOL) 500 MG tablet Take 2 tablets (1,000 mg total) by mouth every 6 (six) hours as needed. 10/31/18  Yes Wieters, [employer]  C, PA-C  albuterol (VENTOLIN HFA) 108 (90 Base) MCG/ACT inhaler Inhale 1-2 puffs into the lungs every 6 (six) hours as needed for wheezing or shortness of breath. 10/16/19  Yes Wieters, Hallie C, PA-C  clonazePAM (KLONOPIN) 1 MG tablet Take 1 tablet (1 mg total) by mouth at bedtime as needed (insomnia). Future refills will need to be addressed with PCP. 07/24/19  Yes Marcial Pacas, MD  medroxyPROGESTERone (DEPO-PROVERA) 150 MG/ML injection medroxyprogesterone 150 mg/mL intramuscular suspension  INJECT 1 ML EVERY 3 MONTHS BY INTRAMUSCULAR ROUTE. 05/31/19  Yes [place], Betty G, MD  omeprazole (PRILOSEC) 40 MG capsule TAKE 1 CAPSULE BY MOUTH EVERY DAY 11/07/18  Yes [place], Betty G, MD  Ubrogepant (UBRELVY) 50 MG TABS Take 50 mg by mouth as needed. May repeat once in 2 hours  05/17/19  Yes Marcial Pacas, MD  zonisamide (ZONEGRAN) 100 MG capsule Take 1 capsule (100 mg total) by mouth 2 (two) times daily. 05/17/19  Yes Marcial Pacas, MD  Galcanezumab-gnlm [facility]) 120 MG/ML SOAJ Inject 120 mg into the skin every 30 (thirty) days. 05/22/19   Marcial Pacas, MD  tiZANidine (ZANAFLEX) 4 MG tablet TAKE 1 TABLET BY MOUTH TWICE A DAY AS NEEDED FOR MUSCLE SPASMS 09/16/18   [place], Betty G, MD  Vitamin D, Ergocalciferol, (DRISDOL) 1.25 MG (50000 UT) CAPS capsule TAKE 1 CAPSULE EVERY FOURTH DAY. 12/12/18   [place], Betty G, MD    Family History Family History  Problem Relation Age of Onset  . Stroke Maternal Grandmother   . Heart disease Maternal Grandmother   . Hypertension Maternal Grandmother   . Hyperlipidemia Maternal Grandmother   . Breast cancer Maternal Grandmother   . Asthma Mother   . Heart disease Mother   . Diabetes Mother   . Early death Mother   . Hypertension Mother   . Thyroid disease Mother   . Anxiety disorder Mother   . Hypertension Father   . Heart disease Father   . Breast cancer Maternal Aunt        under 80  . Breast cancer Paternal Aunt     Social History Social History   Tobacco Use  . Smoking status: Never Smoker  . Smokeless tobacco: Never Used  Substance Use Topics  . Alcohol use: No  . Drug use: No     Allergies   Fioricet [butalbital-apap-caffeine], Inderal [propranolol], and Nsaids   Review of Systems Review of Systems  Unable to perform ROS: Other     Physical Exam Triage Vital Signs ED Triage Vitals  Enc Vitals Group     BP 11/23/19 1229 119/72     Pulse Rate 11/23/19 1229 78     Resp 11/23/19 1229 20     Temp 11/23/19 1229 99.9 F (37.7 C)     Temp Source 11/23/19 1229 Oral     SpO2 11/23/19 1229 100 %     Weight --      Height --      Head Circumference --      Peak Flow --      Pain Score 11/23/19 1232 6     Pain Loc --      Pain Edu? --      Excl. in [place]? --    No data found.  Updated Vital Signs BP 119/72  (BP Location: Left Arm)   Pulse 78   Temp 99.9 F (37.7 C) (Oral)   Resp 20   SpO2 100%   Visual Acuity Right Eye Distance:  Left Eye Distance:   Bilateral Distance:    Right Eye Near:   Left Eye Near:    Bilateral Near:     Physical Exam Vitals and nursing note reviewed.  Constitutional:      General: She is not in acute distress.    Appearance: Normal appearance. She is obese. She is not ill-appearing.  HENT:     Nose: No congestion or rhinorrhea.  Cardiovascular:     Rate and Rhythm: Normal rate and regular rhythm.     Pulses: Normal pulses.     Heart sounds: Normal heart sounds. No murmur. No friction rub.  Pulmonary:     Effort: Pulmonary effort is normal.     Breath sounds: Normal breath sounds.  Skin:    Capillary Refill: Capillary refill takes less than 2 seconds.  Neurological:     Mental Status: She is alert and oriented to person, place, and time. Mental status is at baseline.      UC Treatments / Results  Labs (all labs ordered are listed, but only abnormal results are displayed) Labs Reviewed  CBC WITH DIFFERENTIAL/PLATELET - Abnormal; Notable for the following components:      Result Value   WBC 3.3 (*)    Hemoglobin 10.4 (*)    HCT 33.8 (*)    MCV 75.6 (*)    MCH 23.3 (*)    RDW 16.8 (*)    Platelets 447 (*)    All other components within normal limits  BASIC METABOLIC PANEL - Abnormal; Notable for the following components:   Potassium 3.3 (*)    Glucose, Bld 100 (*)    BUN <5 (*)    All other components within normal limits    EKG   Radiology DG Chest 2 View  Result Date: 11/23/2019 CLINICAL DATA:  Fever EXAM: CHEST - 2 VIEW COMPARISON:  October 16, 2019 FINDINGS: There is an apparent nipple shadow on each side. No edema or consolidation. Heart size and pulmonary vascularity are normal. No adenopathy. No bone lesions. IMPRESSION: Apparent nipple shadow on each side; repeat study with nipple markers could confirm. No edema or  consolidation. Cardiac silhouette normal. No adenopathy. Electronically Signed   By: Bretta Bang III M.D.   On: 11/23/2019 13:25    Procedures Procedures (including critical care time)  Medications Ordered in UC Medications - No data to display  Initial Impression / Assessment and Plan / UC Course  I have reviewed the triage vital signs and the nursing notes.  Pertinent labs & imaging results that were available during my care of the patient were reviewed by me and considered in my medical decision making (see chart for details).     1.  Fever following COVID-19 infection: Chest x-ray is negative for acute lung infiltrate CBC shows a WBC of 3.3.  Patient has microcytic anemia. I encouraged the patient to give me a urine sample for urinalysis but she declined that. She demanded antibiotics for the fever but I explained to her that I do not have any evidence that she has a bacterial infection.  I further explained that some patients will recovered from COVID-19 infection experienced intermittent febrile episodes weeks after the recovered from the infection. Advised patient to continue taking antipyretics. Final Clinical Impressions(s) / UC Diagnoses   Final diagnoses:  Fever, unspecified   Discharge Instructions   None    ED Prescriptions    None     PDMP not reviewed this encounter.   Demaris Callander  O, MD 11/23/19 1414

## 2020-01-31 ENCOUNTER — Other Ambulatory Visit: Payer: Self-pay

## 2020-01-31 ENCOUNTER — Encounter (HOSPITAL_COMMUNITY): Payer: Self-pay

## 2020-01-31 ENCOUNTER — Ambulatory Visit (HOSPITAL_COMMUNITY)
Admission: EM | Admit: 2020-01-31 | Discharge: 2020-01-31 | Disposition: A | Discharge status: Home / Self Care | Payer: PRIVATE HEALTH INSURANCE | Attending: Family Medicine | Admitting: Family Medicine

## 2020-01-31 DIAGNOSIS — F40298 Other specified phobia: Secondary | ICD-10-CM | POA: Diagnosis not present

## 2020-01-31 DIAGNOSIS — G43811 Other migraine, intractable, with status migrainosus: Secondary | ICD-10-CM | POA: Diagnosis not present

## 2020-01-31 DIAGNOSIS — H53149 Visual discomfort, unspecified: Secondary | ICD-10-CM | POA: Diagnosis not present

## 2020-01-31 MED ORDER — KETOROLAC TROMETHAMINE 30 MG/ML IJ SOLN
INTRAMUSCULAR | Status: AC
  Filled 2020-01-31: qty 1

## 2020-01-31 MED ORDER — DIPHENHYDRAMINE HCL 25 MG PO CAPS
ORAL_CAPSULE | ORAL | Status: AC
  Filled 2020-01-31: qty 1

## 2020-01-31 MED ORDER — KETOROLAC TROMETHAMINE 30 MG/ML IJ SOLN
30.0000 mg | Freq: Once | INTRAMUSCULAR | Status: AC
  Administered 2020-01-31: 30 mg via INTRAMUSCULAR

## 2020-01-31 MED ORDER — DIPHENHYDRAMINE HCL 25 MG PO CAPS
25.0000 mg | ORAL_CAPSULE | Freq: Once | ORAL | Status: AC
  Administered 2020-01-31: 25 mg via ORAL

## 2020-01-31 NOTE — ED Provider Notes (Signed)
[facility]    CSN: [id number] Arrival date & time: 01/31/20  6433      History   Chief Complaint Chief Complaint  Patient presents with  . Headache    HPI Saryah MELLA INCLAN is a 39 y.o. female.   Patient reports that she has been experiencing migraine for the last 3 days.  Reports she is experiencing photophobia and phonophobia. She is unable to take her prescribed Emgality because she cannot afford it and insurance does not cover it.  She is taking over-the-counter medications without success.  Denies any cough, shortness of breath, nausea, vomiting, diarrhea, rash, fever, other symptoms.  Per chart review, patient has history of significant for migraines, GERD, anxiety, beta thalassemia trait, anemia.  ROS per HPI  The history is provided by the patient.    Past Medical History:  Diagnosis Date  . Allergy   . Anemia   . Anxiety   . Arthritis pain 08/20/2014  . Back pain   . GERD (gastroesophageal reflux disease)   . History of stress test 02/2011 ([facility])   there was no evidence of ischemia, but she only went 3 1/2 minutes on the treadmill making it very difficult to get a good accurate assessment, however  . Hx of echocardiogram    The echocadiogram was essentially normal with the exception of mild mitral calcification and borderline concentric LVH, which in the setting of her hypertension at this early age is something that mean her blood pressure is well controlled.   . Hypertension   . Iron deficiency anemia, unspecified 07/09/2014  . Migraines   . Palpitations   . Swelling   . Vitamin B12 deficiency 03/17/2016    Patient Active Problem List   Diagnosis Date Noted  . On Depo-Provera for contraception 05/31/2019  . Chronic insomnia 05/17/2019  . Lumbar facet arthropathy 12/14/2018  . Dysmenorrhea, unspecified 05/16/2018  . Knee pain, left 11/17/2017  . Generalized anxiety disorder 10/04/2017  . Morning headache 05/04/2017  . Daytime somnolence  05/04/2017  . Class 3 obesity with body mass index (BMI) of 45.0 to 49.9 in adult 03/28/2017  . Chronic low back pain 01/19/2017  . Vitamin D deficiency 01/18/2017  . Vitamin B12 deficiency 03/17/2016  . Beta thalassemia trait 03/25/2015  . Intractable migraine without aura and without status migrainosus 11/23/2014  . Elevated sed rate 08/20/2014  . Arthritis pain 08/20/2014  . Iron deficiency anemia 07/09/2014  . Symptomatic anemia 06/05/2014  . Chest pain 05/01/2014  . GERD (gastroesophageal reflux disease) 03/22/2014  . S/P gastric bypass 03/22/2014  . Insomnia 03/22/2014  . Migraine 03/22/2014  . Palpitations 03/22/2014    Past Surgical History:  Procedure Laterality Date  . BREAST SURGERY    . CHOLECYSTECTOMY    . GASTRIC BYPASS  2008  . OOPHORECTOMY Right 2010    OB History    Gravida  4   Para  4   Term  4   Preterm      AB      Living        SAB      TAB      Ectopic      Multiple      Live Births               Home Medications    Prior to Admission medications   Medication Sig Start Date End Date Taking? Authorizing Provider  acetaminophen (TYLENOL) 500 MG tablet Take 2 tablets (1,000 mg total) by  mouth every 6 (six) hours as needed. 10/31/18   Wieters, Hallie C, PA-C  albuterol (VENTOLIN HFA) 108 (90 Base) MCG/ACT inhaler Inhale 1-2 puffs into the lungs every 6 (six) hours as needed for wheezing or shortness of breath. 10/16/19   Wieters, Hallie C, PA-C  clonazePAM (KLONOPIN) 1 MG tablet Take 1 tablet (1 mg total) by mouth at bedtime as needed (insomnia). Future refills will need to be addressed with PCP. 07/24/19   Marcial Pacas, MD  Galcanezumab-gnlm (EMGALITY) 120 MG/ML SOAJ Inject 120 mg into the skin every 30 (thirty) days. 05/22/19   Marcial Pacas, MD  medroxyPROGESTERone (DEPO-PROVERA) 150 MG/ML injection medroxyprogesterone 150 mg/mL intramuscular suspension  INJECT 1 ML EVERY 3 MONTHS BY INTRAMUSCULAR ROUTE. 05/31/19   [place], Betty G, MD   omeprazole (PRILOSEC) 40 MG capsule TAKE 1 CAPSULE BY MOUTH EVERY DAY 11/07/18   [place], Betty G, MD  tiZANidine (ZANAFLEX) 4 MG tablet TAKE 1 TABLET BY MOUTH TWICE A DAY AS NEEDED FOR MUSCLE SPASMS 09/16/18   [place], Betty G, MD  Ubrogepant (UBRELVY) 50 MG TABS Take 50 mg by mouth as needed. May repeat once in 2 hours 05/17/19   Marcial Pacas, MD  Vitamin D, Ergocalciferol, (DRISDOL) 1.25 MG (50000 UT) CAPS capsule TAKE 1 CAPSULE EVERY FOURTH DAY. 12/12/18   [place], Betty G, MD  zonisamide (ZONEGRAN) 100 MG capsule Take 1 capsule (100 mg total) by mouth 2 (two) times daily. 05/17/19   Marcial Pacas, MD    Family History Family History  Problem Relation Age of Onset  . Stroke Maternal Grandmother   . Heart disease Maternal Grandmother   . Hypertension Maternal Grandmother   . Hyperlipidemia Maternal Grandmother   . Breast cancer Maternal Grandmother   . Asthma Mother   . Heart disease Mother   . Diabetes Mother   . Early death Mother   . Hypertension Mother   . Thyroid disease Mother   . Anxiety disorder Mother   . Hypertension Father   . Heart disease Father   . Breast cancer Maternal Aunt        under 77  . Breast cancer Paternal Aunt     Social History Social History   Tobacco Use  . Smoking status: Never Smoker  . Smokeless tobacco: Never Used  Substance Use Topics  . Alcohol use: No  . Drug use: No     Allergies   Fioricet [butalbital-apap-caffeine], Inderal [propranolol], and Nsaids   Review of Systems Review of Systems   Physical Exam Triage Vital Signs ED Triage Vitals  Enc Vitals Group     BP 01/31/20 0814 128/90     Pulse Rate 01/31/20 0814 82     Resp 01/31/20 0814 16     Temp 01/31/20 0814 98.6 F (37 C)     Temp Source 01/31/20 0814 Oral     SpO2 01/31/20 0814 100 %     Weight --      Height --      Head Circumference --      Peak Flow --      Pain Score 01/31/20 0813 10     Pain Loc --      Pain Edu? --      Excl. in [place]? --    No data  found.  Updated Vital Signs BP 128/90 (BP Location: Left Wrist)   Pulse 82   Temp 98.6 F (37 C) (Oral)   Resp 16   SpO2 100%  Physical Exam Vitals and nursing note reviewed.  Constitutional:      General: She is not in acute distress.    Appearance: She is well-developed. She is ill-appearing.  HENT:     Head: Normocephalic and atraumatic.     Mouth/Throat:     Mouth: Mucous membranes are moist.     Pharynx: Oropharynx is clear.  Eyes:     General: No scleral icterus.    Extraocular Movements: Extraocular movements intact.     Right eye: Normal extraocular motion and no nystagmus.     Left eye: Normal extraocular motion and no nystagmus.     Conjunctiva/sclera: Conjunctivae normal.     Pupils: Pupils are equal, round, and reactive to light. Pupils are equal.     Right eye: Pupil is round and reactive.     Left eye: Pupil is round and reactive.  Cardiovascular:     Rate and Rhythm: Normal rate and regular rhythm.     Heart sounds: Normal heart sounds. No murmur.  Pulmonary:     Effort: Pulmonary effort is normal. No respiratory distress.     Breath sounds: Normal breath sounds. No stridor. No wheezing, rhonchi or rales.  Chest:     Chest wall: No tenderness.  Abdominal:     General: Bowel sounds are normal.     Palpations: Abdomen is soft.     Tenderness: There is no abdominal tenderness.  Musculoskeletal:        General: Normal range of motion.     Cervical back: Normal range of motion and neck supple.  Skin:    General: Skin is warm and dry.     Capillary Refill: Capillary refill takes less than 2 seconds.  Neurological:     Mental Status: She is alert and oriented to person, place, and time.  Psychiatric:        Mood and Affect: Mood normal.        Speech: Speech normal.        Behavior: Behavior normal.      UC Treatments / Results  Labs (all labs ordered are listed, but only abnormal results are displayed) Labs Reviewed - No data to  display  EKG   Radiology No results found.  Procedures Procedures (including critical care time)  Medications Ordered in UC Medications  ketorolac (TORADOL) 30 MG/ML injection 30 mg (30 mg Intramuscular Given 01/31/20 0832)  diphenhydrAMINE (BENADRYL) capsule 25 mg (25 mg Oral Given 01/31/20 9833)    Initial Impression / Assessment and Plan / UC Course  I have reviewed the triage vital signs and the nursing notes.  Pertinent labs & imaging results that were available during my care of the patient were reviewed by me and considered in my medical decision making (see chart for details).     Migraine: Presents with migraine for the last 3 days.  Has taken over-the-counter medications with no relief.  Patient experiencing photophobia and phonophobia.  Patient wearing sunglasses in office with the lights out.  Toradol 30 mg IM given in office today, as well as p.o. Benadryl 25 mg for migraine cocktail.  Patient states that she does not have to be anywhere today and only lives 10 minutes away.  Instructed patient to follow-up with primary care this office as needed.  Instructed patient to follow-up the ER if symptoms or not being relieved by migraine cocktail today.  Also instructed to follow-up the ER if she is having worse headache of her life.  Patient verbalized  understanding is in agreement with treatment plan.  Final Clinical Impressions(s) / UC Diagnoses   Final diagnoses:  Other migraine with status migrainosus, intractable  Photophobia  Phonophobia     Discharge Instructions     You are experiencing an intractable migraine.    You have received Toradol 30mg  IM with Benadryl 25mg  orally.   Follow up with primary care provider or this office as needed.   If you are experiencing the worst headache of your life, or symptoms are worsening, follow up in the ER for further evaluation and treatment.     ED Prescriptions    None     PDMP not reviewed this encounter.    , NP 02/03/20 [phone]

## 2020-01-31 NOTE — Discharge Instructions (Addendum)
You are experiencing an intractable migraine.    You have received Toradol 30mg  IM with Benadryl 25mg  orally.   Follow up with primary care provider or this office as needed.   If you are experiencing the worst headache of your life, or symptoms are worsening, follow up in the ER for further evaluation and treatment.

## 2020-01-31 NOTE — ED Triage Notes (Signed)
Pt c/o migraine x3 days and unable to get/take Rx 2/2 no insurance coverage. States OTC meds not helping. Denies n/v. Denies URI sx.  Pt drover herself to clinic.

## 2020-02-17 ENCOUNTER — Emergency Department (HOSPITAL_COMMUNITY): Payer: Self-pay

## 2020-02-17 ENCOUNTER — Encounter (HOSPITAL_COMMUNITY): Payer: Self-pay

## 2020-02-17 ENCOUNTER — Emergency Department (HOSPITAL_COMMUNITY)
Admission: EM | Admit: 2020-02-17 | Discharge: 2020-02-18 | Disposition: A | Discharge status: Home / Self Care | Payer: Self-pay | Attending: Emergency Medicine | Admitting: Emergency Medicine

## 2020-02-17 ENCOUNTER — Other Ambulatory Visit: Payer: Self-pay

## 2020-02-17 DIAGNOSIS — R1310 Dysphagia, unspecified: Secondary | ICD-10-CM | POA: Insufficient documentation

## 2020-02-17 DIAGNOSIS — R1013 Epigastric pain: Secondary | ICD-10-CM

## 2020-02-17 DIAGNOSIS — I10 Essential (primary) hypertension: Secondary | ICD-10-CM | POA: Insufficient documentation

## 2020-02-17 DIAGNOSIS — Z79899 Other long term (current) drug therapy: Secondary | ICD-10-CM | POA: Insufficient documentation

## 2020-02-17 LAB — COMPREHENSIVE METABOLIC PANEL
ALT: 27 U/L (ref 0–44)
AST: 35 U/L (ref 15–41)
Albumin: 4.1 g/dL (ref 3.5–5.0)
Alkaline Phosphatase: 55 U/L (ref 38–126)
Anion gap: 14 (ref 5–15)
BUN: 5 mg/dL — ABNORMAL LOW (ref 6–20)
CO2: 22 mmol/L (ref 22–32)
Calcium: 9.4 mg/dL (ref 8.9–10.3)
Chloride: 101 mmol/L (ref 98–111)
Creatinine, Ser: 0.6 mg/dL (ref 0.44–1.00)
GFR calc Af Amer: >60 mL/min (ref >60)
GFR calc non Af Amer: >60 mL/min (ref >60)
Glucose, Bld: 94 mg/dL (ref 70–99)
Potassium: 4.4 mmol/L (ref 3.5–5.1)
Sodium: 137 mmol/L (ref 135–145)
Total Bilirubin: 0.8 mg/dL (ref 0.3–1.2)
Total Protein: 7.9 g/dL (ref 6.5–8.1)

## 2020-02-17 LAB — CBC WITH DIFFERENTIAL/PLATELET
Abs Immature Granulocytes: 0.01 10*3/uL (ref 0.00–0.07)
Basophils Absolute: 0 10*3/uL (ref 0.0–0.1)
Basophils Relative: 0 %
Eosinophils Absolute: 0 10*3/uL (ref 0.0–0.5)
Eosinophils Relative: 0 %
HCT: 36.6 % (ref 36.0–46.0)
Hemoglobin: 10.9 g/dL — ABNORMAL LOW (ref 12.0–15.0)
Immature Granulocytes: 0 %
Lymphocytes Relative: 29 %
Lymphs Abs: 1.5 10*3/uL (ref 0.7–4.0)
MCH: 22 pg — ABNORMAL LOW (ref 26.0–34.0)
MCHC: 29.8 g/dL — ABNORMAL LOW (ref 30.0–36.0)
MCV: 73.8 fL — ABNORMAL LOW (ref 80.0–100.0)
Monocytes Absolute: 0.2 10*3/uL (ref 0.1–1.0)
Monocytes Relative: 4 %
Neutro Abs: 3.4 10*3/uL (ref 1.7–7.7)
Neutrophils Relative %: 67 %
Platelets: 533 10*3/uL — ABNORMAL HIGH (ref 150–400)
RBC: 4.96 MIL/uL (ref 3.87–5.11)
RDW: 18.6 % — ABNORMAL HIGH (ref 11.5–15.5)
WBC: 5.1 10*3/uL (ref 4.0–10.5)
nRBC: 0 % (ref 0.0–0.2)

## 2020-02-17 LAB — LACTIC ACID, PLASMA: Lactic Acid, Venous: 1.2 mmol/L (ref 0.5–1.9)

## 2020-02-17 LAB — URINALYSIS, ROUTINE W REFLEX MICROSCOPIC
Bilirubin Urine: NEGATIVE
Glucose, UA: NEGATIVE mg/dL
Hgb urine dipstick: NEGATIVE
Ketones, ur: 20 mg/dL — AB
Leukocytes,Ua: NEGATIVE
Nitrite: NEGATIVE
Protein, ur: NEGATIVE mg/dL
Specific Gravity, Urine: 1.013 (ref 1.005–1.030)
pH: 6 (ref 5.0–8.0)

## 2020-02-17 LAB — LIPASE, BLOOD: Lipase: 36 U/L (ref 11–51)

## 2020-02-17 LAB — I-STAT BETA HCG BLOOD, ED (MC, WL, AP ONLY): I-stat hCG, quantitative: <5 m[IU]/mL (ref <5)

## 2020-02-17 LAB — PROTIME-INR
INR: 1 (ref 0.8–1.2)
Prothrombin Time: 13 seconds (ref 11.4–15.2)

## 2020-02-17 MED ORDER — IOHEXOL 9 MG/ML PO SOLN
ORAL | Status: AC
  Filled 2020-02-17: qty 500

## 2020-02-17 MED ORDER — SODIUM CHLORIDE 0.9 % IV BOLUS
500.0000 mL | Freq: Once | INTRAVENOUS | Status: AC
  Administered 2020-02-17: 500 mL via INTRAVENOUS

## 2020-02-17 MED ORDER — ONDANSETRON HCL 4 MG/2ML IJ SOLN
4.0000 mg | Freq: Once | INTRAMUSCULAR | Status: AC
  Administered 2020-02-17: 4 mg via INTRAVENOUS
  Filled 2020-02-17: qty 2

## 2020-02-17 MED ORDER — HYDROMORPHONE HCL 1 MG/ML IJ SOLN
1.0000 mg | Freq: Once | INTRAMUSCULAR | Status: AC
  Administered 2020-02-17: 1 mg via INTRAVENOUS
  Filled 2020-02-17: qty 1

## 2020-02-17 MED ORDER — PANTOPRAZOLE SODIUM 40 MG IV SOLR
40.0000 mg | Freq: Once | INTRAVENOUS | Status: AC
  Administered 2020-02-17: 40 mg via INTRAVENOUS
  Filled 2020-02-17: qty 40

## 2020-02-17 MED ORDER — HYDROMORPHONE HCL 1 MG/ML IJ SOLN
1.0000 mg | Freq: Once | INTRAMUSCULAR | Status: AC
  Administered 2020-02-18: 1 mg via INTRAVENOUS
  Filled 2020-02-17: qty 1

## 2020-02-17 NOTE — ED Provider Notes (Signed)
[facility] EMERGENCY DEPARTMENT Provider Note   CSN: [id number] Arrival date & time: 02/17/20  1713     History Chief Complaint  Patient presents with  . Abdominal Pain    Meredith Byrd is a 39 y.o. female.  HPI Patient reports that she took some nutrition Gummies last night and later developed severe pain in her epigastrium.  The pain started about 3 in the morning.  She reports she does not the Gummies might be stuck.  She reports she is swallowing her saliva.  She reports she has had trouble eating or drinking.  It is worsened.  It does radiate into her back.  No fevers or chills.  No cough.  She does have a history of hiatal hernia.  She was on omeprazole but that was a number of years ago and she has not taken it recently.  Patient has history of gastric bypass surgery 13 years ago.  She has not had any problems with pain was eating or swallowing or vomiting prior to this.    Past Medical History:  Diagnosis Date  . Allergy   . Anemia   . Anxiety   . Arthritis pain 08/20/2014  . Back pain   . GERD (gastroesophageal reflux disease)   . History of stress test 02/2011 ([facility])   there was no evidence of ischemia, but she only went 3 1/2 minutes on the treadmill making it very difficult to get a good accurate assessment, however  . Hx of echocardiogram    The echocadiogram was essentially normal with the exception of mild mitral calcification and borderline concentric LVH, which in the setting of her hypertension at this early age is something that mean her blood pressure is well controlled.   . Hypertension   . Iron deficiency anemia, unspecified 07/09/2014  . Migraines   . Palpitations   . Swelling   . Vitamin B12 deficiency 03/17/2016    Patient Active Problem List   Diagnosis Date Noted  . On Depo-Provera for contraception 05/31/2019  . Chronic insomnia 05/17/2019  . Lumbar facet arthropathy 12/14/2018  . Dysmenorrhea, unspecified 05/16/2018  .  Knee pain, left 11/17/2017  . Generalized anxiety disorder 10/04/2017  . Morning headache 05/04/2017  . Daytime somnolence 05/04/2017  . Class 3 obesity with body mass index (BMI) of 45.0 to 49.9 in adult 03/28/2017  . Chronic low back pain 01/19/2017  . Vitamin D deficiency 01/18/2017  . Vitamin B12 deficiency 03/17/2016  . Beta thalassemia trait 03/25/2015  . Intractable migraine without aura and without status migrainosus 11/23/2014  . Elevated sed rate 08/20/2014  . Arthritis pain 08/20/2014  . Iron deficiency anemia 07/09/2014  . Symptomatic anemia 06/05/2014  . Chest pain 05/01/2014  . GERD (gastroesophageal reflux disease) 03/22/2014  . S/P gastric bypass 03/22/2014  . Insomnia 03/22/2014  . Migraine 03/22/2014  . Palpitations 03/22/2014    Past Surgical History:  Procedure Laterality Date  . BREAST SURGERY    . CHOLECYSTECTOMY    . GASTRIC BYPASS  2008  . OOPHORECTOMY Right 2010     OB History    Gravida  4   Para  4   Term  4   Preterm      AB      Living        SAB      TAB      Ectopic      Multiple      Live Births  Family History  Problem Relation Age of Onset  . Stroke Maternal Grandmother   . Heart disease Maternal Grandmother   . Hypertension Maternal Grandmother   . Hyperlipidemia Maternal Grandmother   . Breast cancer Maternal Grandmother   . Asthma Mother   . Heart disease Mother   . Diabetes Mother   . Early death Mother   . Hypertension Mother   . Thyroid disease Mother   . Anxiety disorder Mother   . Hypertension Father   . Heart disease Father   . Breast cancer Maternal Aunt        under 65  . Breast cancer Paternal Aunt     Social History   Tobacco Use  . Smoking status: Never Smoker  . Smokeless tobacco: Never Used  Substance Use Topics  . Alcohol use: No  . Drug use: No    Home Medications Prior to Admission medications   Medication Sig Start Date End Date Taking? Authorizing Provider   acetaminophen (TYLENOL) 500 MG tablet Take 2 tablets (1,000 mg total) by mouth every 6 (six) hours as needed. 10/31/18   Wieters, Hallie C, PA-C  albuterol (VENTOLIN HFA) 108 (90 Base) MCG/ACT inhaler Inhale 1-2 puffs into the lungs every 6 (six) hours as needed for wheezing or shortness of breath. 10/16/19   Wieters, Hallie C, PA-C  clonazePAM (KLONOPIN) 1 MG tablet Take 1 tablet (1 mg total) by mouth at bedtime as needed (insomnia). Future refills will need to be addressed with PCP. 07/24/19   Levert Feinstein, MD  Galcanezumab-gnlm (EMGALITY) 120 MG/ML SOAJ Inject 120 mg into the skin every 30 (thirty) days. 05/22/19   Levert Feinstein, MD  HYDROcodone-acetaminophen (HYCET) 7.5-325 mg/15 ml solution 15 ml every 6 hours as needed for pain 02/18/20   Arby Barrette, MD  medroxyPROGESTERone (DEPO-PROVERA) 150 MG/ML injection medroxyprogesterone 150 mg/mL intramuscular suspension  INJECT 1 ML EVERY 3 MONTHS BY INTRAMUSCULAR ROUTE. 05/31/19   [place], Betty G, MD  omeprazole (PRILOSEC) 20 MG capsule Take 1 capsule (20 mg total) by mouth 2 (two) times daily before a meal. 02/18/20   Arby Barrette, MD  omeprazole (PRILOSEC) 40 MG capsule TAKE 1 CAPSULE BY MOUTH EVERY DAY 11/07/18   [place], Betty G, MD  ondansetron (ZOFRAN ODT) 4 MG disintegrating tablet Take 1 tablet (4 mg total) by mouth every 4 (four) hours as needed for nausea or vomiting. 02/18/20   Arby Barrette, MD  sucralfate (CARAFATE) 1 GM/10ML suspension Take 10 mLs (1 g total) by mouth 4 (four) times daily -  with meals and at bedtime. 02/18/20   Arby Barrette, MD  tiZANidine (ZANAFLEX) 4 MG tablet TAKE 1 TABLET BY MOUTH TWICE A DAY AS NEEDED FOR MUSCLE SPASMS 09/16/18   [place], Betty G, MD  Ubrogepant (UBRELVY) 50 MG TABS Take 50 mg by mouth as needed. May repeat once in 2 hours 05/17/19   Levert Feinstein, MD  Vitamin D, Ergocalciferol, (DRISDOL) 1.25 MG (50000 UT) CAPS capsule TAKE 1 CAPSULE EVERY FOURTH DAY. 12/12/18   [place], Betty G, MD  zonisamide (ZONEGRAN) 100 MG  capsule Take 1 capsule (100 mg total) by mouth 2 (two) times daily. 05/17/19   Levert Feinstein, MD    Allergies    Fioricet [butalbital-apap-caffeine], Inderal [propranolol], and Nsaids  Review of Systems   Review of Systems 10 Systems reviewed and are negative for acute change except as noted in the HPI.  Physical Exam Updated Vital Signs BP 128/74   Pulse 62   Temp 98.3 F (36.8  C) (Oral)   Resp 12   Ht 4\' 10"  (1.473 m)   Wt 73 kg   SpO2 100%   BMI 33.65 kg/m   Physical Exam Constitutional:      Appearance: She is well-developed.  HENT:     Head: Normocephalic and atraumatic.  Eyes:     Extraocular Movements: Extraocular movements intact.     Conjunctiva/sclera: Conjunctivae normal.  Cardiovascular:     Rate and Rhythm: Normal rate and regular rhythm.     Heart sounds: Normal heart sounds.  Pulmonary:     Effort: Pulmonary effort is normal.     Breath sounds: Normal breath sounds.  Abdominal:     General: Bowel sounds are normal. There is no distension.     Palpations: Abdomen is soft.     Tenderness: There is abdominal tenderness.     Comments: Mild epigastric tenderness to palpation.  No guarding.  Lower abdomen nontender.  Musculoskeletal:        General: Normal range of motion.     Cervical back: Neck supple.  Skin:    General: Skin is warm and dry.  Neurological:     Mental Status: She is alert and oriented to person, place, and time.     GCS: GCS eye subscore is 4. GCS verbal subscore is 5. GCS motor subscore is 6.     Coordination: Coordination normal.     ED Results / Procedures / Treatments   Labs (all labs ordered are listed, but only abnormal results are displayed) Labs Reviewed  COMPREHENSIVE METABOLIC PANEL - Abnormal; Notable for the following components:      Result Value   BUN 5 (*)    All other components within normal limits  CBC WITH DIFFERENTIAL/PLATELET - Abnormal; Notable for the following components:   Hemoglobin 10.9 (*)    MCV 73.8  (*)    MCH 22.0 (*)    MCHC 29.8 (*)    RDW 18.6 (*)    Platelets 533 (*)    All other components within normal limits  URINALYSIS, ROUTINE W REFLEX MICROSCOPIC - Abnormal; Notable for the following components:   Ketones, ur 20 (*)    All other components within normal limits  LIPASE, BLOOD  LACTIC ACID, PLASMA  PROTIME-INR  LACTIC ACID, PLASMA  I-STAT BETA HCG BLOOD, ED ([facility], [facility], [facility] ONLY)    EKG None  Radiology CT Abdomen Pelvis Wo Contrast  Result Date: 02/17/2020 CLINICAL DATA:  Nausea and vomiting EXAM: CT ABDOMEN AND PELVIS WITHOUT CONTRAST TECHNIQUE: Multidetector CT imaging of the abdomen and pelvis was performed following the standard protocol without IV contrast. COMPARISON:  04/20/2013 FINDINGS: Lower chest: No acute pleural or parenchymal lung disease. Hepatobiliary: No focal liver abnormality is seen. Status post cholecystectomy. No biliary dilatation. Pancreas: Unremarkable. No pancreatic ductal dilatation or surrounding inflammatory changes. Spleen: Normal in size without focal abnormality. Adrenals/Urinary Tract: No urinary tract calculi or obstructive uropathy within either kidney. The adrenals are normal. Bladder is unremarkable. Stomach/Bowel: Postsurgical changes from bariatric surgery. No bowel obstruction or ileus. Normal appendix right lower quadrant. No bowel wall thickening or inflammatory change. Vascular/Lymphatic: No significant vascular findings are present. No enlarged abdominal or pelvic lymph nodes. Reproductive: Uterus and bilateral adnexa are unremarkable. Other: No free fluid or free gas. Small fat containing umbilical hernia unchanged. Musculoskeletal: No acute or destructive bony lesions. Reconstructed images demonstrate no additional findings. IMPRESSION: 1. No urinary tract calculi or obstructive uropathy. 2. Postsurgical changes from bariatric surgery. 3. Small  fat containing umbilical hernia unchanged. Electronically Signed   By: Sharlet Salina M.D.   On:  02/17/2020 23:26    Procedures Procedures (including critical care time)  Medications Ordered in ED Medications  iohexol (OMNIPAQUE) 9 MG/ML oral solution (has no administration in time range)  HYDROmorphone (DILAUDID) injection 1 mg (has no administration in time range)  ondansetron (ZOFRAN) injection 4 mg (has no administration in time range)  HYDROmorphone (DILAUDID) injection 1 mg (1 mg Intravenous Given 02/17/20 2002)  ondansetron (ZOFRAN) injection 4 mg (4 mg Intravenous Given 02/17/20 2003)  sodium chloride 0.9 % bolus 500 mL (0 mLs Intravenous Stopped 02/17/20 2118)  pantoprazole (PROTONIX) injection 40 mg (40 mg Intravenous Given 02/17/20 2002)    ED Course  I have reviewed the triage vital signs and the nursing notes.  Pertinent labs & imaging results that were available during my care of the patient were reviewed by me and considered in my medical decision making (see chart for details).    MDM Rules/Calculators/A&P                     Patient is clinically well in appearance.  She started having severe epigastric pain last night after eating some nutritional supplement Gummies.  She perceived that they might be stuck in her esophagus.  Patient however has not been spitting out saliva.  She has been able to tolerate liquids taken slowly over the course of the day.  Patient did drink the contrast for CT without vomiting.  She reports this did exacerbate her epigastric pain.  CT does not show any obstruction type pattern.  Hiatal hernia is outlined.  Patient did get improvement with treatment in the emergency department.  Will have her start omeprazole again and take Carafate.  A short course of Hycet given for severe pain.  She is advised for liquid only diet at this time.  She is counseled to follow-up with her surgeon to determine if she should have upper endoscopy.  Return precautions reviewed. Final Clinical Impression(s) / ED Diagnoses Final diagnoses:  Dysphagia, unspecified type   Epigastric pain    Rx / DC Orders ED Discharge Orders         Ordered    omeprazole (PRILOSEC) 20 MG capsule  2 times daily before meals     02/18/20 0011    sucralfate (CARAFATE) 1 GM/10ML suspension  3 times daily with meals & bedtime     02/18/20 0011    ondansetron (ZOFRAN ODT) 4 MG disintegrating tablet  Every 4 hours PRN     02/18/20 0011    HYDROcodone-acetaminophen (HYCET) 7.5-325 mg/15 ml solution     02/18/20 0011           Arby Barrette, MD 02/18/20 0018

## 2020-02-17 NOTE — ED Triage Notes (Signed)
Onset this morning unable to eat or drink anything.  Pt states she took her gummy vitamins last night while laying down and they are "stuck".  No respiratory distress noted.

## 2020-02-18 MED ORDER — HYDROCODONE-ACETAMINOPHEN 7.5-325 MG/15ML PO SOLN: ORAL | 0 refills | Status: DC

## 2020-02-18 MED ORDER — OMEPRAZOLE 20 MG PO CPDR
20.0000 mg | DELAYED_RELEASE_CAPSULE | Freq: Two times a day (BID) | ORAL | 1 refills | Status: DC
Start: 2020-02-18 — End: 2020-09-23

## 2020-02-18 MED ORDER — ONDANSETRON HCL 4 MG/2ML IJ SOLN
4.0000 mg | Freq: Once | INTRAMUSCULAR | Status: AC
  Administered 2020-02-18: 4 mg via INTRAVENOUS
  Filled 2020-02-18: qty 2

## 2020-02-18 MED ORDER — SUCRALFATE 1 GM/10ML PO SUSP: 1.0000 g | Freq: Three times a day (TID) | ORAL | 1 refills | Status: DC

## 2020-02-18 MED ORDER — ONDANSETRON 4 MG PO TBDP: 4.0000 mg | ORAL_TABLET | ORAL | 0 refills | Status: DC | PRN

## 2020-02-18 NOTE — Discharge Instructions (Addendum)
1.  Schedule follow-up with your surgeon this upcoming week. 2.  Start taking Carafate as prescribed.  Take omeprazole twice daily as prescribed.  You may take Zofran if needed for nausea. 3.  Only eat bland liquid foods. 4.  Return to the emergency department if you have worsening symptoms.

## 2020-03-27 ENCOUNTER — Telehealth: Payer: Self-pay | Admitting: Physical Medicine and Rehabilitation

## 2020-03-27 NOTE — Telephone Encounter (Signed)
Pt called wanting another injection, pt had Bil L4-5 Facet 03/08/2019. If no new injuries/traumas ok to repeat?

## 2020-03-27 NOTE — Telephone Encounter (Signed)
Patient called. She would like an appointment with Dr. Alvester Morin. Her call back number is [phone]

## 2020-03-27 NOTE — Telephone Encounter (Signed)
Pt now has [facility], pt states she will call back once she finds her ID card. Explained to pt once we get her member ID number I will proceed with prior auth via insurance.

## 2020-03-27 NOTE — Telephone Encounter (Signed)
If no other issues then, yes

## 2020-03-29 ENCOUNTER — Telehealth: Payer: Self-pay | Admitting: Physical Medicine and Rehabilitation

## 2020-03-29 NOTE — Telephone Encounter (Signed)
Patient called.   Said she was told to call back with her ID number which is [id number]

## 2020-04-01 ENCOUNTER — Ambulatory Visit: Payer: 59 | Attending: Internal Medicine

## 2020-04-01 DIAGNOSIS — Z20822 Contact with and (suspected) exposure to covid-19: Secondary | ICD-10-CM | POA: Insufficient documentation

## 2020-04-01 NOTE — Telephone Encounter (Signed)
Sent prior authorization to [facility], case is pending.

## 2020-04-02 LAB — NOVEL CORONAVIRUS, NAA: SARS-CoV-2, NAA: NOT DETECTED

## 2020-04-02 LAB — SARS-COV-2, NAA 2 DAY TAT

## 2020-04-02 NOTE — Telephone Encounter (Signed)
Called pt and lvm #1 to get her scheduled for an OV due to insurance denial.

## 2020-04-03 ENCOUNTER — Telehealth: Payer: Self-pay | Admitting: Physical Medicine and Rehabilitation

## 2020-04-03 NOTE — Telephone Encounter (Signed)
Patient called.   Missed our class to schedule

## 2020-04-03 NOTE — Telephone Encounter (Signed)
Pt is scheduled for 04/30/20 for an OV

## 2020-04-15 ENCOUNTER — Other Ambulatory Visit: Payer: Self-pay | Admitting: Family Medicine

## 2020-04-16 ENCOUNTER — Telehealth: Payer: Self-pay | Admitting: Neurology

## 2020-04-16 ENCOUNTER — Telehealth: Payer: Self-pay | Admitting: *Deleted

## 2020-04-16 NOTE — Telephone Encounter (Signed)
PA for Emgality 120mg  started on covermymeds (key: [id number]). Decision pending  PA for Ubrelvy 50mg  started on covermymeds (key: [id number]). Decision pending.  Pt has coverage w/ [employer] [phone]).  She has pending appt on 07/09/2020.

## 2020-04-16 NOTE — Telephone Encounter (Signed)
PA for both Bernita Raisin and Emgality documented in separate phone note today. I have spoken with the patient and she is aware these PA requests are in progress. She has scheduled a follow up with Margie Ege, NP. There was a gap in her follow ups due to the loss of insurance. She has new coverage now.

## 2020-04-16 NOTE — Telephone Encounter (Signed)
Pt called wanting to speak to the RN about Prior Auth for her medication Ubrogepant (UBRELVY) 50 MG TABS and others as well. Please advise.

## 2020-04-17 ENCOUNTER — Encounter: Payer: Self-pay | Admitting: *Deleted

## 2020-04-17 NOTE — Telephone Encounter (Addendum)
Pt has coverage with [employer] - ph#[phone].  The PA for Emgality was approved through 07/18/2020. Tracking ID# for the approval: [id number].  The PA for Bernita Raisin has been denied. The new plan does not allow Ubrelvy to be prescribed concurrently with other CGRP inhibitors. Since the patient is taking Emgality, coverage was denied. She is going to download a co-pay savings card to see if the medication will be affordable. If not, then she will call back.  The patient has tried Imitrex,Zomig, Relpax, Maxalt in the past for rescue. She had GI upset with Fioricet.  Reports good response w/ Tramadol in the past. It  just takes a little longer to work than the [place]. She was previously allowed #15 per month with no early refills.

## 2020-04-30 ENCOUNTER — Telehealth: Payer: Self-pay | Admitting: Physical Medicine and Rehabilitation

## 2020-04-30 ENCOUNTER — Ambulatory Visit: Payer: Self-pay | Admitting: Physical Medicine and Rehabilitation

## 2020-04-30 NOTE — Telephone Encounter (Signed)
Patient called.   Missed her appointment. Requesting a call back to reschedule.   Call back: [phone]

## 2020-05-01 ENCOUNTER — Telehealth: Payer: Self-pay

## 2020-05-01 NOTE — Telephone Encounter (Signed)
Rescheduled

## 2020-05-01 NOTE — Telephone Encounter (Signed)
Called pt and reschedule OV on 05/14/20.

## 2020-05-05 ENCOUNTER — Other Ambulatory Visit: Payer: Self-pay

## 2020-05-05 ENCOUNTER — Ambulatory Visit (HOSPITAL_COMMUNITY)
Admission: EM | Admit: 2020-05-05 | Discharge: 2020-05-05 | Disposition: A | Discharge status: Home / Self Care | Payer: 59 | Attending: Emergency Medicine | Admitting: Emergency Medicine

## 2020-05-05 ENCOUNTER — Encounter (HOSPITAL_COMMUNITY): Payer: Self-pay

## 2020-05-05 DIAGNOSIS — H6123 Impacted cerumen, bilateral: Secondary | ICD-10-CM | POA: Diagnosis not present

## 2020-05-05 NOTE — ED Triage Notes (Signed)
Pt presents to UC for bilateral ear pain x4 months. Pt states  "I come here every so often for them to be irrigated and it feels like it is about time". Pt denies other complaints. Pt denies fevers. Pt states left is worse than right.

## 2020-05-05 NOTE — ED Provider Notes (Signed)
[facility]    CSN: [id number] Arrival date & time: 05/05/20  1458      History   Chief Complaint Chief Complaint  Patient presents with  . Otalgia    HPI Meredith Byrd is a 39 y.o. female.   Chad Cordial presents with complaints of pressure to ears, sensation of wax impaction. L>R. A few weeks of progression. No other URI symptoms. Has had similar in the past with wax impact. No fevers. No change in hearing. Has put peroxide into the ear which has not helped.    ROS per HPI, negative if not otherwise mentioned.      Past Medical History:  Diagnosis Date  . Allergy   . Anemia   . Anxiety   . Arthritis pain 08/20/2014  . Back pain   . GERD (gastroesophageal reflux disease)   . History of stress test 02/2011 ([facility])   there was no evidence of ischemia, but she only went 3 1/2 minutes on the treadmill making it very difficult to get a good accurate assessment, however  . Hx of echocardiogram    The echocadiogram was essentially normal with the exception of mild mitral calcification and borderline concentric LVH, which in the setting of her hypertension at this early age is something that mean her blood pressure is well controlled.   . Hypertension   . Iron deficiency anemia, unspecified 07/09/2014  . Migraines   . Palpitations   . Swelling   . Vitamin B12 deficiency 03/17/2016    Patient Active Problem List   Diagnosis Date Noted  . On Depo-Provera for contraception 05/31/2019  . Chronic insomnia 05/17/2019  . Lumbar facet arthropathy 12/14/2018  . Dysmenorrhea, unspecified 05/16/2018  . Knee pain, left 11/17/2017  . Generalized anxiety disorder 10/04/2017  . Morning headache 05/04/2017  . Daytime somnolence 05/04/2017  . Class 3 obesity with body mass index (BMI) of 45.0 to 49.9 in adult 03/28/2017  . Chronic low back pain 01/19/2017  . Vitamin D deficiency 01/18/2017  . Vitamin B12 deficiency 03/17/2016  . Beta thalassemia trait  03/25/2015  . Intractable migraine without aura and without status migrainosus 11/23/2014  . Elevated sed rate 08/20/2014  . Arthritis pain 08/20/2014  . Iron deficiency anemia 07/09/2014  . Symptomatic anemia 06/05/2014  . Chest pain 05/01/2014  . GERD (gastroesophageal reflux disease) 03/22/2014  . S/P gastric bypass 03/22/2014  . Insomnia 03/22/2014  . Migraine 03/22/2014  . Palpitations 03/22/2014    Past Surgical History:  Procedure Laterality Date  . BREAST SURGERY    . CHOLECYSTECTOMY    . GASTRIC BYPASS  2008  . OOPHORECTOMY Right 2010    OB History    Gravida  4   Para  4   Term  4   Preterm      AB      Living        SAB      TAB      Ectopic      Multiple      Live Births               Home Medications    Prior to Admission medications   Medication Sig Start Date End Date Taking? Authorizing Provider  acetaminophen (TYLENOL) 500 MG tablet Take 2 tablets (1,000 mg total) by mouth every 6 (six) hours as needed. 10/31/18   Wieters, Hallie C, PA-C  albuterol (VENTOLIN HFA) 108 (90 Base) MCG/ACT inhaler Inhale 1-2 puffs into the lungs  every 6 (six) hours as needed for wheezing or shortness of breath. 10/16/19   Wieters, Hallie C, PA-C  clonazePAM (KLONOPIN) 1 MG tablet Take 1 tablet (1 mg total) by mouth at bedtime as needed (insomnia). Future refills will need to be addressed with PCP. 07/24/19   Levert Feinstein, MD  Galcanezumab-gnlm (EMGALITY) 120 MG/ML SOAJ Inject 120 mg into the skin every 30 (thirty) days. 05/22/19   Levert Feinstein, MD  medroxyPROGESTERone Acetate 150 MG/ML SUSY INJECT 1 ML INTRAMUSCULARLY EVERY 3 MONTHS 04/16/20   [place], Betty G, MD  omeprazole (PRILOSEC) 20 MG capsule Take 1 capsule (20 mg total) by mouth 2 (two) times daily before a meal. 02/18/20   Arby Barrette, MD  omeprazole (PRILOSEC) 40 MG capsule TAKE 1 CAPSULE BY MOUTH EVERY DAY 11/07/18   [place], Betty G, MD  ondansetron (ZOFRAN ODT) 4 MG disintegrating tablet Take 1 tablet (4  mg total) by mouth every 4 (four) hours as needed for nausea or vomiting. 02/18/20   Arby Barrette, MD  sucralfate (CARAFATE) 1 GM/10ML suspension Take 10 mLs (1 g total) by mouth 4 (four) times daily -  with meals and at bedtime. 02/18/20   Arby Barrette, MD  tiZANidine (ZANAFLEX) 4 MG tablet TAKE 1 TABLET BY MOUTH TWICE A DAY AS NEEDED FOR MUSCLE SPASMS 09/16/18   [place], Betty G, MD  Vitamin D, Ergocalciferol, (DRISDOL) 1.25 MG (50000 UT) CAPS capsule TAKE 1 CAPSULE EVERY FOURTH DAY. 12/12/18   [place], Betty G, MD  zonisamide (ZONEGRAN) 100 MG capsule Take 1 capsule (100 mg total) by mouth 2 (two) times daily. 05/17/19   Levert Feinstein, MD    Family History Family History  Problem Relation Age of Onset  . Stroke Maternal Grandmother   . Heart disease Maternal Grandmother   . Hypertension Maternal Grandmother   . Hyperlipidemia Maternal Grandmother   . Breast cancer Maternal Grandmother   . Asthma Mother   . Heart disease Mother   . Diabetes Mother   . Early death Mother   . Hypertension Mother   . Thyroid disease Mother   . Anxiety disorder Mother   . Hypertension Father   . Heart disease Father   . Breast cancer Maternal Aunt        under 65  . Breast cancer Paternal Aunt     Social History Social History   Tobacco Use  . Smoking status: Never Smoker  . Smokeless tobacco: Never Used  Vaping Use  . Vaping Use: Never used  Substance Use Topics  . Alcohol use: No  . Drug use: No     Allergies   Fioricet [butalbital-apap-caffeine], Inderal [propranolol], and Nsaids   Review of Systems Review of Systems   Physical Exam Triage Vital Signs ED Triage Vitals  Enc Vitals Group     BP 05/05/20 1559 (!) 110/58     Pulse Rate 05/05/20 1559 87     Resp 05/05/20 1559 16     Temp 05/05/20 1559 98.3 F (36.8 C)     Temp Source 05/05/20 1559 Oral     SpO2 05/05/20 1559 100 %     Weight --      Height --      Head Circumference --      Peak Flow --      Pain Score 05/05/20  1557 9     Pain Loc --      Pain Edu? --      Excl. in GC? --  No data found.  Updated Vital Signs BP (!) 110/58 (BP Location: Right Arm)   Pulse 87   Temp 98.3 F (36.8 C) (Oral)   Resp 16   SpO2 100%    Physical Exam Constitutional:      General: She is not in acute distress.    Appearance: She is well-developed.  HENT:     Right Ear: There is impacted cerumen.     Left Ear: There is impacted cerumen.     Ears:     Comments: Clear and WNL following irrigation per nursing staff; small abrasion to canal from irrigation to left ear  Cardiovascular:     Rate and Rhythm: Normal rate.  Pulmonary:     Effort: Pulmonary effort is normal.  Skin:    General: Skin is warm and dry.  Neurological:     Mental Status: She is alert and oriented to person, place, and time.      UC Treatments / Results  Labs (all labs ordered are listed, but only abnormal results are displayed) Labs Reviewed - No data to display  EKG   Radiology No results found.  Procedures Procedures (including critical care time)  Medications Ordered in UC Medications - No data to display  Initial Impression / Assessment and Plan / UC Course  I have reviewed the triage vital signs and the nursing notes.  Pertinent labs & imaging results that were available during my care of the patient were reviewed by me and considered in my medical decision making (see chart for details).     Cerumen removal bilaterally without otherwise acute findings. Patient feels improved.  Final Clinical Impressions(s) / UC Diagnoses   Final diagnoses:  Bilateral impacted cerumen   Discharge Instructions   None    ED Prescriptions    None     PDMP not reviewed this encounter.   Georgetta Haber, NP 05/06/20 [id number]

## 2020-05-09 ENCOUNTER — Other Ambulatory Visit: Payer: Self-pay | Admitting: Cardiology

## 2020-05-09 DIAGNOSIS — R0609 Other forms of dyspnea: Secondary | ICD-10-CM

## 2020-05-09 DIAGNOSIS — R0600 Dyspnea, unspecified: Secondary | ICD-10-CM

## 2020-05-09 NOTE — Progress Notes (Signed)
Presenting with dyspnea on exertion, was also found to have a murmur on physical exam by her PCP.  I briefly examined her, she has a soft 2/6 systolic ejection murmur in the pulmonary area and also P2 appears to be accentuated.  We will obtain an echocardiogram in view of dyspnea and murmur.    ICD-10-CM   1. Dyspnea on exertion  R06.00 PCV ECHOCARDIOGRAM COMPLETE

## 2020-05-13 ENCOUNTER — Other Ambulatory Visit: Payer: Self-pay

## 2020-05-13 ENCOUNTER — Ambulatory Visit: Payer: 59

## 2020-05-13 DIAGNOSIS — R0609 Other forms of dyspnea: Secondary | ICD-10-CM

## 2020-05-13 DIAGNOSIS — R0600 Dyspnea, unspecified: Secondary | ICD-10-CM

## 2020-05-14 ENCOUNTER — Other Ambulatory Visit: Payer: Self-pay

## 2020-05-14 ENCOUNTER — Encounter: Payer: Self-pay | Admitting: Physical Medicine and Rehabilitation

## 2020-05-14 ENCOUNTER — Ambulatory Visit: Payer: 59 | Admitting: Physical Medicine and Rehabilitation

## 2020-05-14 VITALS — BP 100/73 | HR 66

## 2020-05-14 DIAGNOSIS — G8929 Other chronic pain: Secondary | ICD-10-CM | POA: Diagnosis not present

## 2020-05-14 DIAGNOSIS — M545 Low back pain: Secondary | ICD-10-CM

## 2020-05-14 DIAGNOSIS — M47816 Spondylosis without myelopathy or radiculopathy, lumbar region: Secondary | ICD-10-CM

## 2020-05-14 DIAGNOSIS — M25561 Pain in right knee: Secondary | ICD-10-CM | POA: Diagnosis not present

## 2020-05-14 NOTE — Progress Notes (Signed)
Meredith Byrd - 39 y.o. female MRN [id number]  Date of birth: 1980-10-13  Office Visit Note: Visit Date: 05/14/2020 PCP: Irena Reichmann, DO Referred by: No ref. provider found  Subjective: Chief Complaint  Patient presents with  . Lower Back - Pain   HPI: Meredith Byrd is a 39 y.o. female who comes in today Evaluation and management of chronic worsening severe mostly axial low back pain with some referral into the thighs.  By way of quick review she was followed initially by Dr. Annell Greening in our office from an orthopedic standpoint.  This was in the spring 2020.  We saw her soon after that and completed diagnostic and therapeutic medial branch blocks at L4-5.  She reports 70 to 80 percent relief with that injection for several months.  Symptoms did not return after that but she did not return to the orthopedic office due to issues with coronavirus and just being really not wanting to return during the increasing cases.  She had also lost her insurance during this time.  Prior to the initial injections in 2020 she had had physical therapy she had had medication management.  She has been through gastric bypass and has lost a significant amount of weight down from 280 pounds till today she is in the 160 range and doing much better.  Unfortunately the weight loss does not seem to help the back pain that much.  Since we last seen him there is been no significant trauma or falls.  No significant radicular complaints although she gets some referral into the posterior lateral thighs.  MRI was completed by Dr. Ophelia Charter and this is reviewed again with the patient today and reviewed below.  She mainly has facet arthropathy at the L4-5 level and somewhat above and below this level.  She currently is using tizanidine and Tylenol without much relief.  She reports the pain is worse with standing walking better with sitting it can be even painful sitting and getting up to stand.  She describes a constant  sharp aching low back pain which is 10 out of 10 and affects her daily living.  She also feels that increasing pannus from prior weight loss is a contributor to her back pain.  Secondary complaint today is right knee pain.  This is chronic.  She reports clicking but no locking no real swelling but it can be somewhat swollen at times.  It really is limiting her activity level as well.  No left knee pain.  Review of Systems  Musculoskeletal: Positive for back pain and joint pain.  All other systems reviewed and are negative.  Otherwise per HPI.  Assessment & Plan: Visit Diagnoses:  1. Spondylosis without myelopathy or radiculopathy, lumbar region   2. Chronic bilateral low back pain without sciatica   3. Chronic pain of right knee     Plan: Findings:  1.  Chronic severe axial low back pain worse going from sit to stand and standing with a clinical exam consistent with facet mediated low back pain as well as MRI imaging of severe facet arthritis at L4-5.  Prior diagnostic blocks were successful in 2020 with 80% relief but unfortunately patient had a bout with coronavirus and lost her job and lost her insurance.  She comes in today with 6 months of worsening progressive back pain without any trauma no red flag complaints.  No imaging needed today.  We need to complete second diagnostic facet block and then look at radiofrequency ablation.  She will continue with current home exercise program and has had physical therapy.  She has had good weight loss with gastric bypass but unfortunately not helping her back pain and she feels like her increasing pain this is one of the causes of her back pain at this point.  She is trying to get approval for panniculectomy.  2.  Chronic worsening right knee pain.  We will have her follow-up with Dr. Annell Greening from an orthopedic standpoint for her right knee.  Exam today does not show any urgency but he will do a better job treating that.    Meds & Orders: No  orders of the defined types were placed in this encounter.  No orders of the defined types were placed in this encounter.   Follow-up: Return for Diagnostic medial branch blocks at L4-5 bilaterally with fluoroscopic guidance.   Procedures: No procedures performed  No notes on file   Clinical History: MRI LUMBAR SPINE WITHOUT CONTRAST  TECHNIQUE: Multiplanar, multisequence MR imaging of the lumbar spine was performed. No intravenous contrast was administered.  COMPARISON: Prior radiograph from 10/13/2014.  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 L5-S1 disc is rudimentary.  Alignment: Physiologic.  Vertebrae: Vertebral body height maintained without evidence for acute or chronic fracture. Bone marrow signal intensity diffusely decreased on T1 weighted imaging most commonly related to anemia, smoking, or obesity. No discrete or worrisome osseous lesions. No abnormal marrow edema.  Conus medullaris and cauda equina: Conus extends to the L1 level. Conus and cauda equina appear normal.  Paraspinal and other soft tissues: Paraspinous soft tissues within normal limits. Visualized visceral structures within normal limits.  Disc levels:  T11-12: Tiny right paracentral disc protrusion. No stenosis or impingement.  T12-L1: Unremarkable.  L1-2: Unremarkable.  L2-3: Negative interspace. Moderate bilateral facet hypertrophy. No significant canal or foraminal stenosis. No impingement.  L3-4: Negative interspace. Moderate facet and ligament flavum hypertrophy. No significant canal or foraminal stenosis. No impingement.  L4-5: No significant disc bulge. Severe left with moderate right facet hypertrophy. No significant stenosis or impingement.  L5-S1: Transitional lumbosacral anatomy with rudimentary L5-S1 disc. No stenosis.  IMPRESSION: 1. Moderate to advanced multilevel facet hypertrophy at L2-3 through L4-5,  most notable at L4-5 on the left. Finding could contribute to chronic low back pain. 2. Tiny right paracentral disc protrusion at T11-12 without stenosis. No other significant disc pathology identified. No stenosis or impingement. 3. Transitional lumbosacral anatomy. Careful correlation with numbering system on this exam recommended prior to any potential future planned intervention.   Electronically Signed  By: Rise Mu M.D.  On: 12/12/2018 22:05   She reports that she has never smoked. She has never used smokeless tobacco. No results for input(s): HGBA1C, LABURIC in the last 8760 hours.  Objective:  VS:  HT:    WT:   BMI:     BP:100/73  HR:66bpm  TEMP: ( )  RESP:  Physical Exam Vitals and nursing note reviewed.  Constitutional:      General: She is not in acute distress.    Appearance: Normal appearance. She is well-developed.  HENT:     Head: Normocephalic and atraumatic.     Nose: Nose normal.     Mouth/Throat:     Mouth: Mucous membranes are moist.     Pharynx: Oropharynx is clear.  Eyes:     Conjunctiva/sclera: Conjunctivae normal.     Pupils: Pupils are equal, round, and reactive to light.  Cardiovascular:  Rate and Rhythm: Regular rhythm.  Pulmonary:     Effort: Pulmonary effort is normal. No respiratory distress.  Abdominal:     General: There is no distension.     Palpations: Abdomen is soft.     Tenderness: There is no guarding.  Musculoskeletal:     Cervical back: Normal range of motion and neck supple.     Right lower leg: No edema.     Left lower leg: No edema.     Comments: Patient is very slow to stand from a seated position.  She has concordant low back pain with extension and facet loading of the lower spine.  No pain with hip rotation.  No trigger points noted.  No tender points.  She does have pain on the joint line of the right knee with some swelling compared to the left but not exquisite.  She has good varus and valgus stability on  the right.  Skin:    General: Skin is warm and dry.     Findings: No erythema or rash.  Neurological:     General: No focal deficit present.     Mental Status: She is alert and oriented to person, place, and time.     Sensory: No sensory deficit.     Motor: No weakness or abnormal muscle tone.     Coordination: Coordination normal.     Gait: Gait normal.  Psychiatric:        Mood and Affect: Mood normal.        Behavior: Behavior normal.        Thought Content: Thought content normal.     Ortho Exam  Imaging: No results found.  Past Medical/Family/Surgical/Social History: Medications & Allergies reviewed per EMR, new medications updated. Patient Active Problem List   Diagnosis Date Noted  . On Depo-Provera for contraception 05/31/2019  . Chronic insomnia 05/17/2019  . Lumbar facet arthropathy 12/14/2018  . Dysmenorrhea, unspecified 05/16/2018  . Knee pain, left 11/17/2017  . Generalized anxiety disorder 10/04/2017  . Morning headache 05/04/2017  . Daytime somnolence 05/04/2017  . Class 3 obesity with body mass index (BMI) of 45.0 to 49.9 in adult 03/28/2017  . Chronic low back pain 01/19/2017  . Vitamin D deficiency 01/18/2017  . Vitamin B12 deficiency 03/17/2016  . Beta thalassemia trait 03/25/2015  . Intractable migraine without aura and without status migrainosus 11/23/2014  . Elevated sed rate 08/20/2014  . Arthritis pain 08/20/2014  . Iron deficiency anemia 07/09/2014  . Symptomatic anemia 06/05/2014  . Chest pain 05/01/2014  . GERD (gastroesophageal reflux disease) 03/22/2014  . S/P gastric bypass 03/22/2014  . Insomnia 03/22/2014  . Migraine 03/22/2014  . Palpitations 03/22/2014   Past Medical History:  Diagnosis Date  . Allergy   . Anemia   . Anxiety   . Arthritis pain 08/20/2014  . Back pain   . GERD (gastroesophageal reflux disease)   . History of stress test 02/2011 ([facility])   there was no evidence of ischemia, but she only went 3 1/2 minutes on the  treadmill making it very difficult to get a good accurate assessment, however  . Hx of echocardiogram    The echocadiogram was essentially normal with the exception of mild mitral calcification and borderline concentric LVH, which in the setting of her hypertension at this early age is something that mean her blood pressure is well controlled.   . Hypertension   . Iron deficiency anemia, unspecified 07/09/2014  . Migraines   . Palpitations   .  Swelling   . Vitamin B12 deficiency 03/17/2016   Family History  Problem Relation Age of Onset  . Stroke Maternal Grandmother   . Heart disease Maternal Grandmother   . Hypertension Maternal Grandmother   . Hyperlipidemia Maternal Grandmother   . Breast cancer Maternal Grandmother   . Asthma Mother   . Heart disease Mother   . Diabetes Mother   . Early death Mother   . Hypertension Mother   . Thyroid disease Mother   . Anxiety disorder Mother   . Hypertension Father   . Heart disease Father   . Breast cancer Maternal Aunt        under 65  . Breast cancer Paternal Aunt    Past Surgical History:  Procedure Laterality Date  . BREAST SURGERY    . CHOLECYSTECTOMY    . GASTRIC BYPASS  2008  . OOPHORECTOMY Right 2010   Social History   Occupational History  . 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 Smoking status: Never Smoker  . Smokeless tobacco: Never Used  Vaping Use  . Vaping Use: Never used  Substance and Sexual Activity  . Alcohol use: No  . Drug use: No  . Sexual activity: Yes

## 2020-05-14 NOTE — Progress Notes (Signed)
Lower back pain. Right knee pain. Pain is constant. Injection in May helped for about 3 weeks. Also requesting handicap placard.  Numeric Pain Rating Scale and Functional Assessment Average Pain 10 Pain Right Now 9 My pain is constant, sharp and aching Pain is worse with: some activites Pain improves with: injections   In the last MONTH (on 0-10 scale) has pain interfered with the following?  1. General activity like being  able to carry out your everyday physical activities such as walking, climbing stairs, carrying groceries, or moving a chair?  Rating(8)  2. Relation with others like being able to carry out your usual social activities and roles such as  activities at home, at work and in your community. Rating(8)  3. Enjoyment of life such that you have  been bothered by emotional problems such as feeling anxious, depressed or irritable?  Rating(7)

## 2020-05-15 ENCOUNTER — Encounter: Payer: Self-pay | Admitting: Physical Medicine and Rehabilitation

## 2020-05-15 ENCOUNTER — Other Ambulatory Visit: Payer: Self-pay | Admitting: Family Medicine

## 2020-05-15 ENCOUNTER — Telehealth: Payer: Self-pay | Admitting: Physical Medicine and Rehabilitation

## 2020-05-15 DIAGNOSIS — Z1231 Encounter for screening mammogram for malignant neoplasm of breast: Secondary | ICD-10-CM

## 2020-05-15 NOTE — Telephone Encounter (Signed)
To [facility]

## 2020-05-15 NOTE — Telephone Encounter (Signed)
New note has been dictated from OV yesterday and needs to be resubmitted for auth for [phone].

## 2020-05-16 ENCOUNTER — Other Ambulatory Visit: Payer: Self-pay

## 2020-05-22 ENCOUNTER — Other Ambulatory Visit: Payer: Self-pay | Admitting: Physical Medicine and Rehabilitation

## 2020-05-22 DIAGNOSIS — F411 Generalized anxiety disorder: Secondary | ICD-10-CM

## 2020-05-22 MED ORDER — DIAZEPAM 5 MG PO TABS: ORAL_TABLET | ORAL | 0 refills | Status: DC

## 2020-05-22 NOTE — Telephone Encounter (Signed)
Pt has requested valium for the appt.

## 2020-05-22 NOTE — Telephone Encounter (Signed)
Pt auth# was approve. [id number]

## 2020-05-22 NOTE — Progress Notes (Signed)
Pre-procedure diazepam ordered for pre-operative anxiety.  

## 2020-05-22 NOTE — Telephone Encounter (Signed)
Ok done

## 2020-05-25 ENCOUNTER — Telehealth: Payer: Self-pay | Admitting: Neurology

## 2020-05-27 ENCOUNTER — Other Ambulatory Visit: Payer: Self-pay | Admitting: *Deleted

## 2020-05-27 ENCOUNTER — Telehealth: Payer: Self-pay | Admitting: Physical Medicine and Rehabilitation

## 2020-05-27 MED ORDER — UBRELVY 50 MG PO TABS: ORAL_TABLET | ORAL | 11 refills | Status: DC

## 2020-05-27 NOTE — Telephone Encounter (Signed)
Patient called requesting a sooner appt. Please call patient about this matter at [phone].

## 2020-05-27 NOTE — Telephone Encounter (Signed)
Rescheduled

## 2020-05-27 NOTE — Telephone Encounter (Signed)
From previous note:  Pt has coverage with [facility] [employer] - ph#[phone].  The PA for Emgality was approved through 07/18/2020. Tracking ID# for the approval: [id number].  The PA for Bernita Raisin has been denied. The new plan does not allow Ubrelvy to be prescribed concurrently with other CGRP inhibitors. Since the patient is taking Emgality, coverage was denied. She is going to download a co-pay savings card to see if the medication will be affordable. If not, then she will call back.  I spoke to [place] at [facility] on [address]. She is going to call the patient's insurance for an override so that she can pick up her Emgality. The patient is aware of this is being worked on for her.The pharmacy will call her when it is ready for pick up.

## 2020-05-27 NOTE — Telephone Encounter (Signed)
Tried to call back at number given which is the number listed in patient's chart. ? Wrong number.

## 2020-05-27 NOTE — Telephone Encounter (Signed)
Pt ask for Marcelino Duster to call her to discuss problem with  Galcanezumab-gnlm (EMGALITY) 120 MG/ML SOAJ and Ubrelvy.

## 2020-05-27 NOTE — Telephone Encounter (Signed)
Christy at [facility] on [address] has called to inform that Nadeen Landau may want to call [facility]([employer]ru [employer]) at their new # [phone] re: the PA on pt's Emgality .  If there are further questions for Neysa Bonito she can be reached at [phone]

## 2020-05-27 NOTE — Telephone Encounter (Signed)
I contacted [facility] and started two PA requests w/ her updated plan information.   1) Emgality 120mg  - EOC/Case ID: [id number] 50mg  - EOC/Case ID: Roosvelt Harps  Pt ID: . Both cases are pending review.  The patient has been updated with this information.

## 2020-05-28 ENCOUNTER — Encounter: Payer: Self-pay | Admitting: Orthopaedic Surgery

## 2020-05-28 ENCOUNTER — Ambulatory Visit (INDEPENDENT_AMBULATORY_CARE_PROVIDER_SITE_OTHER): Payer: 59 | Admitting: Orthopaedic Surgery

## 2020-05-28 ENCOUNTER — Ambulatory Visit: Payer: Self-pay

## 2020-05-28 VITALS — BP 98/63 | HR 56 | Ht 59.0 in | Wt 162.0 lb

## 2020-05-28 DIAGNOSIS — M25561 Pain in right knee: Secondary | ICD-10-CM | POA: Diagnosis not present

## 2020-05-28 DIAGNOSIS — M2550 Pain in unspecified joint: Secondary | ICD-10-CM

## 2020-05-28 DIAGNOSIS — G8929 Other chronic pain: Secondary | ICD-10-CM

## 2020-05-28 MED ORDER — LIDOCAINE HCL 1 % IJ SOLN
3.0000 mL | INTRAMUSCULAR | Status: AC | PRN
  Administered 2020-05-28: 3 mL

## 2020-05-28 MED ORDER — METHYLPREDNISOLONE ACETATE 40 MG/ML IJ SUSP
80.0000 mg | INTRAMUSCULAR | Status: AC | PRN
  Administered 2020-05-28: 80 mg via INTRA_ARTICULAR

## 2020-05-28 MED ORDER — BUPIVACAINE HCL 0.25 % IJ SOLN
6.0000 mL | INTRAMUSCULAR | Status: AC | PRN
  Administered 2020-05-28: 6 mL via INTRA_ARTICULAR

## 2020-05-28 NOTE — Progress Notes (Signed)
Office Visit Note   Patient: Meredith Byrd           Date of Birth: 12/23/1980           MRN: [id number] Visit Date: 05/28/2020              Requested by: Janie Morning, DO [address] [place],  [place] [address] PCP: Janie Morning, DO   Assessment & Plan: Visit Diagnoses:  1. Chronic pain of right knee   2. Polyarthralgia     Plan: In hopes of giving patient some relief of her right knee pain offered injection.  Patient sent right knee was prepped with Betadine and intra-articular Marcaine/Depo-Medrol injection performed.  After sitting for a few minutes she did have fairly good relief with anesthetic in place.  Today also did get blood work to check a CBC and arthritis panel.  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  We will also review labs at follow-up visit.  Follow-Up Instructions: Return in about 2 weeks (around 06/11/2020).   Orders:  Orders Placed This Encounter  Procedures  . XR KNEE 3 VIEW RIGHT  . CBC  . Rheumatoid Factor  . Antinuclear Antib (ANA)  . Uric acid  . Sed Rate (ESR)   No orders of the defined types were placed in this encounter.     Procedures: Large Joint Inj: R knee on 05/28/2020 11:20 AM Indications: pain Details: 25 G 1.5 in needle, anteromedial approach Medications: 3 mL lidocaine 1 %; 6 mL bupivacaine 0.25 %; 80 mg methylPREDNISolone acetate 40 MG/ML Outcome: tolerated well, no immediate complications Consent was given by the patient. Patient was prepped and draped in the usual sterile fashion.       Clinical Data: No additional findings.   Subjective: Chief Complaint  Patient presents with  . Right Knee - Pain    HPI 39 year old black female comes in today with complaints of right knee pain.  Pain ongoing x1 year.  No specific injury.   States that she had some insurance issues which prevented her from coming in a lot sooner.  Right knee pain with all activities including walking, squatting, pivoting.  Has had some knee swelling and stiffness.  Some feeling of mechanical symptoms.  No instability.  She has also had some issues chronically with her left knee and was seen by a provider in 2019 for this.  She has not had any surgery or injections of the knees in the past.  She is unsure of a family history of inflammatory arthropathy.  She has had some occasional aches and pains in the right wrist.  Patient had a gastric bypass 10+ years ago and over that time.  She has maintained 100 pound weight loss.  She is unable to take oral NSAIDs due to her GI history.  No improvement of pain with Tylenol. Review of Systems No current cardiac pulmonary GI GU issues  Objective: Vital Signs: BP 98/63   Pulse (!) 56   Ht _0  (1.499 m)   Wt 162 lb (73.5 kg)   BMI 32.72 kg/m   Physical Exam Constitutional:      Appearance: She is obese.  HENT:     Head: Normocephalic and atraumatic.     Nose: Nose normal.  Eyes:  Extraocular Movements: Extraocular movements intact.     Pupils: Pupils are equal, round, and reactive to light.  Pulmonary:     Effort: Pulmonary effort is normal. No respiratory distress.  Musculoskeletal:     Comments: Gait is antalgic.  Right wrist she does have some tenderness with palpation.  Negative logroll bilateral hips.  Negative straight leg raise.  Bilateral knees good range of motion but does have pain on the right.  Positive right patellofemoral crepitus.  Crepitus on the left.  Right knee is diffusely tender.  Right knee some swelling without large effusion.  Exquisitely tender at the joint line.  Left knee joint line tender but less than the right.  Bilateral knee ligaments are stable.  Bilateral calves nontender.  No signs of infection.  Neurological:     General: No focal deficit present.     Mental  Status: She is alert and oriented to person, place, and time.     Ortho Exam  Specialty Comments:  No specialty comments available.  Imaging: No results found.   PMFS History: Patient Active Problem List   Diagnosis Date Noted  . On Depo-Provera for contraception 05/31/2019  . Chronic insomnia 05/17/2019  . Lumbar facet arthropathy 12/14/2018  . Dysmenorrhea, unspecified 05/16/2018  . Knee pain, left 11/17/2017  . Generalized anxiety disorder 10/04/2017  . Morning headache 05/04/2017  . Daytime somnolence 05/04/2017  . Class 3 obesity with body mass index (BMI) of 45.0 to 49.9 in adult 03/28/2017  . Chronic low back pain 01/19/2017  . Vitamin D deficiency 01/18/2017  . Vitamin B12 deficiency 03/17/2016  . Beta thalassemia trait 03/25/2015  . Intractable migraine without aura and without status migrainosus 11/23/2014  . Elevated sed rate 08/20/2014  . Arthritis pain 08/20/2014  . Iron deficiency anemia 07/09/2014  . Symptomatic anemia 06/05/2014  . Chest pain 05/01/2014  . GERD (gastroesophageal reflux disease) 03/22/2014  . S/P gastric bypass 03/22/2014  . Insomnia 03/22/2014  . Migraine 03/22/2014  . Palpitations 03/22/2014   Past Medical History:  Diagnosis Date  . Allergy   . Anemia   . Anxiety   . Arthritis pain 08/20/2014  . Back pain   . GERD (gastroesophageal reflux disease)   . History of stress test 02/2011 ([facility])   there was no evidence of ischemia, but she only went 3 1/2 minutes on the treadmill making it very difficult to get a good accurate assessment, however  . Hx of echocardiogram    The echocadiogram was essentially normal with the exception of mild mitral calcification and borderline concentric LVH, which in the setting of her hypertension at this early age is something that mean her blood pressure is well controlled.   . Hypertension   . Iron deficiency anemia, unspecified 07/09/2014  . Migraines   . Palpitations   . Swelling   . Vitamin B12  deficiency 03/17/2016    Family History  Problem Relation Age of Onset  . Stroke Maternal Grandmother   . Heart disease Maternal Grandmother   . Hypertension Maternal Grandmother   . Hyperlipidemia Maternal Grandmother   . Breast cancer Maternal Grandmother   . Asthma Mother   . Heart disease Mother   . Diabetes Mother   . Early death Mother   . Hypertension Mother   . Thyroid disease Mother   . Anxiety disorder Mother   . Hypertension Father   . Heart disease Father   . Breast cancer Maternal Aunt        under  63  . Breast cancer Paternal Aunt     Past Surgical History:  Procedure Laterality Date  . BREAST SURGERY    . CHOLECYSTECTOMY    . GASTRIC BYPASS  2008  . OOPHORECTOMY Right 2010   Social History   Occupational History  . Occupation: Dipl[employer] Services operational officer  Tobacco Use  . Smoking status: Never Smoker  . Smokeless tobacco: Never Used  Vaping Use  . Vaping Use: Never used  Substance and Sexual Activity  . Alcohol use: No  . Drug use: No  . Sexual activity: Yes

## 2020-05-28 NOTE — Telephone Encounter (Signed)
Both PA requests have been approved.   Emagality valid through 11/23/2020.  Ubrelvy valid through 05/27/2021.

## 2020-05-28 NOTE — Telephone Encounter (Addendum)
I called and left the patient a message letting her know both medications have been approved now. Provided our number to call back with any questions.  I also called to notify [facility] of the approvals. I left a message on the provider line.

## 2020-05-29 ENCOUNTER — Ambulatory Visit: Payer: 59

## 2020-05-29 LAB — CBC
HCT: 35.5 % (ref 35.0–45.0)
Hemoglobin: 10.3 g/dL — ABNORMAL LOW (ref 11.7–15.5)
MCH: 21.8 pg — ABNORMAL LOW (ref 27.0–33.0)
MCHC: 29 g/dL — ABNORMAL LOW (ref 32.0–36.0)
MCV: 75.2 fL — ABNORMAL LOW (ref 80.0–100.0)
Platelets: 517 10*3/uL — ABNORMAL HIGH (ref 140–400)
RBC: 4.72 10*6/uL (ref 3.80–5.10)
RDW: 17 % — ABNORMAL HIGH (ref 11.0–15.0)
WBC: 4.1 10*3/uL (ref 3.8–10.8)

## 2020-05-29 LAB — URIC ACID: Uric Acid, Serum: 3.4 mg/dL (ref 2.5–7.0)

## 2020-05-29 LAB — RHEUMATOID FACTOR: Rheumatoid fact SerPl-aCnc: <14 IU/mL (ref <14)

## 2020-05-29 LAB — SEDIMENTATION RATE: Sed Rate: 19 mm/h (ref 0–20)

## 2020-05-29 LAB — ANA: Anti Nuclear Antibody (ANA): NEGATIVE

## 2020-05-30 ENCOUNTER — Other Ambulatory Visit: Payer: Self-pay

## 2020-05-30 ENCOUNTER — Ambulatory Visit: Payer: 59 | Admitting: Physical Medicine and Rehabilitation

## 2020-05-30 ENCOUNTER — Ambulatory Visit: Payer: Self-pay

## 2020-05-30 ENCOUNTER — Encounter: Payer: Self-pay | Admitting: Physical Medicine and Rehabilitation

## 2020-05-30 VITALS — BP 108/71 | HR 99

## 2020-05-30 DIAGNOSIS — M47816 Spondylosis without myelopathy or radiculopathy, lumbar region: Secondary | ICD-10-CM

## 2020-05-30 MED ORDER — METHYLPREDNISOLONE ACETATE 80 MG/ML IJ SUSP
80.0000 mg | Freq: Once | INTRAMUSCULAR | Status: AC
  Administered 2020-05-30: 80 mg

## 2020-05-30 NOTE — Progress Notes (Signed)
Pt states lower back pain. Pt state everything makes her pain worse. Pt states she never have relief.  Numeric Pain Rating Scale and Functional Assessment Average Pain 9   In the last MONTH (on 0-10 scale) has pain interfered with the following?  1. General activity like being  able to carry out your everyday physical activities such as walking, climbing stairs, carrying groceries, or moving a chair?  Rating(9)   +Driver, -BT, -Dye Allergies.

## 2020-05-31 ENCOUNTER — Ambulatory Visit
Admission: RE | Admit: 2020-05-31 | Discharge: 2020-05-31 | Disposition: A | Discharge status: Home / Self Care | Payer: 59 | Source: Ambulatory Visit | Attending: Family Medicine | Admitting: Family Medicine

## 2020-05-31 DIAGNOSIS — Z1231 Encounter for screening mammogram for malignant neoplasm of breast: Secondary | ICD-10-CM

## 2020-05-31 NOTE — Progress Notes (Signed)
Meredith Byrd - 39 y.o. female MRN [id number]  Date of birth: 07-Oct-1981  Office Visit Note: Visit Date: 05/30/2020 PCP: Irena Reichmann, DO Referred by: Irena Reichmann, DO  Subjective: Chief Complaint  Patient presents with  . Lower Back - Pain   HPI:  Meredith Byrd is a 39 y.o. female who comes in today at the request of Dr. Naaman Plummer for planned Bilateral L4-L5 Lumbar facet/medial branch block with fluoroscopic guidance.  The patient has failed conservative care including home exercise, medications, time and activity modification.  This injection will be diagnostic and hopefully therapeutic.  Please see requesting physician notes for further details and justification.  Exam shows concordant low back pain with facet joint loading and extension.  Patient has sacralized L5 segment.  ROS Otherwise per HPI.  Assessment & Plan: Visit Diagnoses:  1. Spondylosis without myelopathy or radiculopathy, lumbar region     Plan: No additional findings.   Meds & Orders:  Meds ordered this encounter  Medications  . methylPREDNISolone acetate (DEPO-MEDROL) injection 80 mg    Orders Placed This Encounter  Procedures  . Facet Injection  . XR C-ARM NO REPORT    Follow-up: Return if symptoms worsen or fail to improve.   Procedures: No procedures performed  Lumbar Diagnostic Facet Joint Nerve Block with Fluoroscopic Guidance   Patient: Meredith Byrd      Date of Birth: 12-31-1980 MRN: [id number] PCP: Irena Reichmann, DO      Visit Date: 05/30/2020   Universal Protocol:    Date/Time: 08/20/215:55 AM  Consent Given By: the patient  Position: PRONE  Additional Comments: Vital signs were monitored before and after the procedure. Patient was prepped and draped in the usual sterile fashion. The correct patient, procedure, and site was verified.   Injection Procedure Details:  Procedure Site One Meds Administered:  Meds ordered this encounter  Medications  .  methylPREDNISolone acetate (DEPO-MEDROL) injection 80 mg     Laterality: Bilateral  Location/Site:  L4-L5  Needle size: 22 ga.  Needle type:spinal  Needle Placement: Oblique pedical  Findings:   -Comments: There was excellent flow of contrast along the articular pillars without intravascular flow.  Procedure Details: The fluoroscope beam is vertically oriented in AP and then obliqued 15 to 20 degrees to the ipsilateral side of the desired nerve to achieve the "Scotty dog" appearance.  The skin over the target area of the junction of the superior articulating process and the transverse process (sacral ala if blocking the L5 dorsal rami) was locally anesthetized with a 1 ml volume of 1% Lidocaine without Epinephrine.  The spinal needle was inserted and advanced in a trajectory view down to the target.   After contact with periosteum and negative aspirate for blood and CSF, correct placement without intravascular or epidural spread was confirmed by injecting 0.5 ml. of Isovue-250.  A spot radiograph was obtained of this image.    Next, a 0.5 ml. volume of the injectate described above was injected. The needle was then redirected to the other facet joint nerves mentioned above if needed.  Prior to the procedure, the patient was given a Pain Diary which was completed for baseline measurements.  After the procedure, the patient rated their pain every 30 minutes and will continue rating at this frequency for a total of 5 hours.  The patient has been asked to complete the Diary and return to [place] by mail, fax or hand delivered as soon as possible.   Additional Comments:  The patient tolerated the procedure well Dressing: 2 x 2 sterile gauze and Band-Aid    Post-procedure details: Patient was observed during the procedure. Post-procedure instructions were reviewed.  Patient left the clinic in stable condition.    Clinical History: MRI LUMBAR SPINE WITHOUT  CONTRAST  TECHNIQUE: Multiplanar, multisequence MR imaging of the lumbar spine was performed. No intravenous contrast was administered.  COMPARISON: Prior radiograph from 10/13/2014.  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 L5-S1 disc is rudimentary.  Alignment: Physiologic.  Vertebrae: Vertebral body height maintained without evidence for acute or chronic fracture. Bone marrow signal intensity diffusely decreased on T1 weighted imaging most commonly related to anemia, smoking, or obesity. No discrete or worrisome osseous lesions. No abnormal marrow edema.  Conus medullaris and cauda equina: Conus extends to the L1 level. Conus and cauda equina appear normal.  Paraspinal and other soft tissues: Paraspinous soft tissues within normal limits. Visualized visceral structures within normal limits.  Disc levels:  T11-12: Tiny right paracentral disc protrusion. No stenosis or impingement.  T12-L1: Unremarkable.  L1-2: Unremarkable.  L2-3: Negative interspace. Moderate bilateral facet hypertrophy. No significant canal or foraminal stenosis. No impingement.  L3-4: Negative interspace. Moderate facet and ligament flavum hypertrophy. No significant canal or foraminal stenosis. No impingement.  L4-5: No significant disc bulge. Severe left with moderate right facet hypertrophy. No significant stenosis or impingement.  L5-S1: Transitional lumbosacral anatomy with rudimentary L5-S1 disc. No stenosis.  IMPRESSION: 1. Moderate to advanced multilevel facet hypertrophy at L2-3 through L4-5, most notable at L4-5 on the left. Finding could contribute to chronic low back pain. 2. Tiny right paracentral disc protrusion at T11-12 without stenosis. No other significant disc pathology identified. No stenosis or impingement. 3. Transitional lumbosacral anatomy. Careful correlation with numbering system on this exam recommended  prior to any potential future planned intervention.   Electronically Signed  By: Rise Mu M.D.  On: 12/12/2018 22:05     Objective:  VS:  HT:    WT:   BMI:     BP:108/71  HR:99bpm  TEMP: ( )  RESP:  Physical Exam Constitutional:      General: She is not in acute distress.    Appearance: Normal appearance. She is not ill-appearing.  HENT:     Head: Normocephalic and atraumatic.     Right Ear: External ear normal.     Left Ear: External ear normal.  Eyes:     Extraocular Movements: Extraocular movements intact.  Cardiovascular:     Rate and Rhythm: Normal rate.     Pulses: Normal pulses.  Musculoskeletal:     Right lower leg: No edema.     Left lower leg: No edema.     Comments: Patient has good distal strength with no pain over the greater trochanters.  No clonus or focal weakness. Patient somewhat slow to rise from a seated position to full extension.  There is concordant low back pain with facet loading and lumbar spine extension rotation.  There are no definitive trigger points but the patient is somewhat tender across the lower back and PSIS.  There is no pain with hip rotation.   Skin:    Findings: No erythema, lesion or rash.  Neurological:     General: No focal deficit present.     Mental Status: She is alert and oriented to person, place, and time.     Sensory: No sensory deficit.     Motor: No weakness or abnormal muscle tone.  Coordination: Coordination normal.  Psychiatric:        Mood and Affect: Mood normal.        Behavior: Behavior normal.      Imaging: XR C-ARM NO REPORT  Result Date: 05/30/2020 Please see Notes tab for imaging impression.

## 2020-05-31 NOTE — Procedures (Signed)
Lumbar Diagnostic Facet Joint Nerve Block with Fluoroscopic Guidance   Patient: Meredith Byrd      Date of Birth: 1980-11-13 MRN: [id number] PCP: Irena Reichmann, DO      Visit Date: 05/30/2020   Universal Protocol:    Date/Time: 08/20/215:55 AM  Consent Given By: the patient  Position: PRONE  Additional Comments: Vital signs were monitored before and after the procedure. Patient was prepped and draped in the usual sterile fashion. The correct patient, procedure, and site was verified.   Injection Procedure Details:  Procedure Site One Meds Administered:  Meds ordered this encounter  Medications  . methylPREDNISolone acetate (DEPO-MEDROL) injection 80 mg     Laterality: Bilateral  Location/Site:  L4-L5  Needle size: 22 ga.  Needle type:spinal  Needle Placement: Oblique pedical  Findings:   -Comments: There was excellent flow of contrast along the articular pillars without intravascular flow.  Procedure Details: The fluoroscope beam is vertically oriented in AP and then obliqued 15 to 20 degrees to the ipsilateral side of the desired nerve to achieve the "Scotty dog" appearance.  The skin over the target area of the junction of the superior articulating process and the transverse process (sacral ala if blocking the L5 dorsal rami) was locally anesthetized with a 1 ml volume of 1% Lidocaine without Epinephrine.  The spinal needle was inserted and advanced in a trajectory view down to the target.   After contact with periosteum and negative aspirate for blood and CSF, correct placement without intravascular or epidural spread was confirmed by injecting 0.5 ml. of Isovue-250.  A spot radiograph was obtained of this image.    Next, a 0.5 ml. volume of the injectate described above was injected. The needle was then redirected to the other facet joint nerves mentioned above if needed.  Prior to the procedure, the patient was given a Pain Diary which was completed for  baseline measurements.  After the procedure, the patient rated their pain every 30 minutes and will continue rating at this frequency for a total of 5 hours.  The patient has been asked to complete the Diary and return to [place] by mail, fax or hand delivered as soon as possible.   Additional Comments:  The patient tolerated the procedure well Dressing: 2 x 2 sterile gauze and Band-Aid    Post-procedure details: Patient was observed during the procedure. Post-procedure instructions were reviewed.  Patient left the clinic in stable condition.

## 2020-06-03 ENCOUNTER — Other Ambulatory Visit: Payer: Self-pay | Admitting: Family Medicine

## 2020-06-03 DIAGNOSIS — N644 Mastodynia: Secondary | ICD-10-CM

## 2020-06-06 ENCOUNTER — Ambulatory Visit: Payer: 59 | Admitting: Surgery

## 2020-06-06 DIAGNOSIS — M25561 Pain in right knee: Secondary | ICD-10-CM

## 2020-06-06 NOTE — Progress Notes (Signed)
Office Visit Note   Patient: Meredith Byrd           Date of Birth: 1981-09-13           MRN: [id number] Visit Date: 06/06/2020              Requested by: Irena Reichmann, DO [address] [place],  [place] [address] PCP: Irena Reichmann, DO   Assessment & Plan: Visit Diagnoses:  1. 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  She failed conservative treatment with intra-articular Marcaine/Depo-Medrol injection.  Follow-up after completion to discuss results and further treatment options.  She did complain of some left knee pain today likely from her overcompensating for right knee issues.  If this continues to be a problem at follow-up visit I did discuss possibly trying an injection there.  Also did review patient's labs today and arthritis panel was unremarkable.  Hemoglobin 10.3 and patient has a history of chronic anemia.  Follow-Up Instructions: Return in about 4 weeks (around 07/04/2020) for With Fayrene Fearing to review right knee MRI.   Orders:  Orders Placed This Encounter  Procedures  . MR Knee Right w/o contrast   No orders of the defined types were placed in this encounter.     Procedures: No procedures performed   Clinical Data: No additional findings.   Subjective: Chief Complaint  Patient presents with  . Right Knee - Follow-up    HPI 39 year old black female with history of right knee pain mechanical symptoms returns for recheck after having intra-articular Marcaine/Depo-Medrol injection.  States that her knee is about 40% better.  Continues have pain and popping.  Since last office visit she has had some discomfort in the left knee and is wanting injection today.  No mechanical symptoms or feeling of instability on the left side.  Objective: Vital Signs: There were no vitals taken for  this visit.  Physical Exam Exam right knee there is swelling and joint line continues to be tender.  Left knee minimal swelling without palpable effusion.  Minimally tender at the joint line. Ortho Exam  Specialty Comments:  No specialty comments available.  Imaging: No results found.   PMFS History: Patient Active Problem List   Diagnosis Date Noted  . On Depo-Provera for contraception 05/31/2019  . Chronic insomnia 05/17/2019  . Lumbar facet arthropathy 12/14/2018  . Dysmenorrhea, unspecified 05/16/2018  . Knee pain, left 11/17/2017  . Generalized anxiety disorder 10/04/2017  . Morning headache 05/04/2017  . Daytime somnolence 05/04/2017  . Class 3 obesity with body mass index (BMI) of 45.0 to 49.9 in adult 03/28/2017  . Chronic low back pain 01/19/2017  . Vitamin D deficiency 01/18/2017  . Vitamin B12 deficiency 03/17/2016  . Beta thalassemia trait 03/25/2015  . Intractable migraine without aura and without status migrainosus 11/23/2014  . Elevated sed rate 08/20/2014  . Arthritis pain 08/20/2014  . Iron deficiency anemia 07/09/2014  . Symptomatic anemia 06/05/2014  . Chest pain 05/01/2014  . GERD (gastroesophageal reflux disease) 03/22/2014  . S/P gastric bypass 03/22/2014  . Insomnia 03/22/2014  . Migraine 03/22/2014  . Palpitations 03/22/2014   Past Medical History:  Diagnosis Date  . Allergy   . Anemia   . Anxiety   . Arthritis pain 08/20/2014  . Back pain   . GERD (gastroesophageal reflux disease)   .  History of stress test 02/2011 ([facility])   there was no evidence of ischemia, but she only went 3 1/2 minutes on the treadmill making it very difficult to get a good accurate assessment, however  . Hx of echocardiogram    The echocadiogram was essentially normal with the exception of mild mitral calcification and borderline concentric LVH, which in the setting of her hypertension at this early age is something that mean her blood pressure is well controlled.   .  Hypertension   . Iron deficiency anemia, unspecified 07/09/2014  . Migraines   . Palpitations   . Swelling   . Vitamin B12 deficiency 03/17/2016    Family History  Problem Relation Age of Onset  . Stroke Maternal Grandmother   . Heart disease Maternal Grandmother   . Hypertension Maternal Grandmother   . Hyperlipidemia Maternal Grandmother   . Breast cancer Maternal Grandmother   . Asthma Mother   . Heart disease Mother   . Diabetes Mother   . Early death Mother   . Hypertension Mother   . Thyroid disease Mother   . Anxiety disorder Mother   . Hypertension Father   . Heart disease Father   . Breast cancer Maternal Aunt        under 65  . Breast cancer Paternal Aunt     Past Surgical History:  Procedure Laterality Date  . BREAST SURGERY    . CHOLECYSTECTOMY    . GASTRIC BYPASS  2008  . OOPHORECTOMY Right 2010   Social History   Occupational History  . Occupation: Human resources officer  Tobacco Use  . Smoking status: Never Smoker  . Smokeless tobacco: Never Used  Vaping Use  . Vaping Use: Never used  Substance and Sexual Activity  . Alcohol use: No  . Drug use: No  . Sexual activity: Yes

## 2020-06-07 ENCOUNTER — Ambulatory Visit
Admission: RE | Admit: 2020-06-07 | Discharge: 2020-06-07 | Disposition: A | Discharge status: Home / Self Care | Payer: 59 | Source: Ambulatory Visit | Attending: Family Medicine | Admitting: Family Medicine

## 2020-06-07 ENCOUNTER — Other Ambulatory Visit: Payer: Self-pay

## 2020-06-07 DIAGNOSIS — N644 Mastodynia: Secondary | ICD-10-CM

## 2020-06-18 ENCOUNTER — Telehealth: Payer: Self-pay | Admitting: Physical Medicine and Rehabilitation

## 2020-06-18 ENCOUNTER — Telehealth: Payer: Self-pay

## 2020-06-18 NOTE — Telephone Encounter (Signed)
Patient called she wants to proceed with [facility], she started new job she only gets October 1, November 5 off if possible for appointment to be made either day.  Call back [phone]. Would like detailed message left on voicemail.

## 2020-06-18 NOTE — Telephone Encounter (Signed)
Patient called. Says the injection did not work. Her call back number is [phone]

## 2020-06-19 ENCOUNTER — Encounter: Payer: 59 | Admitting: Physical Medicine and Rehabilitation

## 2020-06-20 ENCOUNTER — Telehealth: Payer: Self-pay | Admitting: Physical Medicine and Rehabilitation

## 2020-06-20 NOTE — Telephone Encounter (Signed)
Prior blocks 2020 80% relief but short term, see notes, see if she had relief even very short term,? Pain diary. RFA if temp relief. If no relief may need OV with me OR can see Dr. Ophelia Charter if needed

## 2020-06-20 NOTE — Telephone Encounter (Signed)
This was sent to [place] 2 days ago. Patient also called 2 days ago with the following message: Patient called she wants to proceed with [facility], she started new job she only gets October 1, November 5 off if possible for appointment to be made either day.  Call back [phone]. Would like detailed message left on voicemail.   Please advise.

## 2020-06-20 NOTE — Telephone Encounter (Signed)
Patient is returning a call back to [place]. Patient is asking for Toni Amend to leave a detail message because she is unable to answer her phone while at work. Patient phone number is [phone].

## 2020-06-20 NOTE — Telephone Encounter (Signed)
See previous message

## 2020-06-20 NOTE — Telephone Encounter (Signed)
Left message #1

## 2020-06-21 ENCOUNTER — Telehealth: Payer: Self-pay | Admitting: Physical Medicine and Rehabilitation

## 2020-06-21 NOTE — Telephone Encounter (Signed)
See previous message

## 2020-06-21 NOTE — Telephone Encounter (Signed)
Patient called. She says she did not have a diary. She got 30% relive. The pain is worst or the same while walking and standing. Her call back number is [phone]

## 2020-06-21 NOTE — Telephone Encounter (Signed)
Called patient and left a message asking her to call back to let us know is she had any relief at even for a very short time after the last injection.

## 2020-06-24 NOTE — Telephone Encounter (Signed)
See previous message

## 2020-06-24 NOTE — Telephone Encounter (Signed)
Patient reports that she only had 30% relief and that she did not receive a pain diary. Left her a message asking her to call back to schedule an office visit with either Dr. Alvester Morin or Dr. Ophelia Charter.

## 2020-06-26 ENCOUNTER — Encounter (HOSPITAL_COMMUNITY): Payer: Self-pay

## 2020-06-26 ENCOUNTER — Ambulatory Visit (HOSPITAL_COMMUNITY)
Admission: EM | Admit: 2020-06-26 | Discharge: 2020-06-26 | Disposition: A | Discharge status: Home / Self Care | Payer: 59 | Attending: Family Medicine | Admitting: Family Medicine

## 2020-06-26 ENCOUNTER — Other Ambulatory Visit: Payer: Self-pay

## 2020-06-26 ENCOUNTER — Ambulatory Visit (INDEPENDENT_AMBULATORY_CARE_PROVIDER_SITE_OTHER): Payer: 59

## 2020-06-26 DIAGNOSIS — Z8701 Personal history of pneumonia (recurrent): Secondary | ICD-10-CM | POA: Diagnosis not present

## 2020-06-26 DIAGNOSIS — Z9049 Acquired absence of other specified parts of digestive tract: Secondary | ICD-10-CM | POA: Insufficient documentation

## 2020-06-26 DIAGNOSIS — R062 Wheezing: Secondary | ICD-10-CM | POA: Diagnosis not present

## 2020-06-26 DIAGNOSIS — I10 Essential (primary) hypertension: Secondary | ICD-10-CM | POA: Insufficient documentation

## 2020-06-26 DIAGNOSIS — Z793 Long term (current) use of hormonal contraceptives: Secondary | ICD-10-CM | POA: Diagnosis not present

## 2020-06-26 DIAGNOSIS — K645 Perianal venous thrombosis: Secondary | ICD-10-CM | POA: Diagnosis not present

## 2020-06-26 DIAGNOSIS — Z20822 Contact with and (suspected) exposure to covid-19: Secondary | ICD-10-CM | POA: Diagnosis not present

## 2020-06-26 DIAGNOSIS — Z9884 Bariatric surgery status: Secondary | ICD-10-CM | POA: Diagnosis not present

## 2020-06-26 DIAGNOSIS — J069 Acute upper respiratory infection, unspecified: Secondary | ICD-10-CM | POA: Insufficient documentation

## 2020-06-26 DIAGNOSIS — Z90721 Acquired absence of ovaries, unilateral: Secondary | ICD-10-CM | POA: Diagnosis not present

## 2020-06-26 DIAGNOSIS — R05 Cough: Secondary | ICD-10-CM

## 2020-06-26 DIAGNOSIS — F411 Generalized anxiety disorder: Secondary | ICD-10-CM | POA: Diagnosis not present

## 2020-06-26 DIAGNOSIS — Z886 Allergy status to analgesic agent status: Secondary | ICD-10-CM | POA: Diagnosis not present

## 2020-06-26 DIAGNOSIS — G4700 Insomnia, unspecified: Secondary | ICD-10-CM | POA: Diagnosis not present

## 2020-06-26 DIAGNOSIS — Z8616 Personal history of COVID-19: Secondary | ICD-10-CM | POA: Insufficient documentation

## 2020-06-26 DIAGNOSIS — Z6832 Body mass index (BMI) 32.0-32.9, adult: Secondary | ICD-10-CM | POA: Insufficient documentation

## 2020-06-26 DIAGNOSIS — Z79899 Other long term (current) drug therapy: Secondary | ICD-10-CM | POA: Insufficient documentation

## 2020-06-26 DIAGNOSIS — K219 Gastro-esophageal reflux disease without esophagitis: Secondary | ICD-10-CM | POA: Diagnosis not present

## 2020-06-26 HISTORY — DX: COVID-19: U07.1

## 2020-06-26 MED ORDER — HYDROCODONE-ACETAMINOPHEN 5-325 MG PO TABS: 1.0000 | ORAL_TABLET | Freq: Two times a day (BID) | ORAL | 0 refills | Status: DC | PRN

## 2020-06-26 MED ORDER — ALBUTEROL SULFATE HFA 108 (90 BASE) MCG/ACT IN AERS: 1.0000 | INHALATION_SPRAY | Freq: Four times a day (QID) | RESPIRATORY_TRACT | 0 refills | Status: AC | PRN

## 2020-06-26 MED ORDER — DOXYCYCLINE HYCLATE 100 MG PO CAPS: 100.0000 mg | ORAL_CAPSULE | Freq: Two times a day (BID) | ORAL | 0 refills | Status: DC

## 2020-06-26 MED ORDER — HYDROCORTISONE (PERIANAL) 2.5 % EX CREA: 1.0000 "application " | TOPICAL_CREAM | Freq: Two times a day (BID) | CUTANEOUS | 0 refills | Status: DC

## 2020-06-26 NOTE — Discharge Instructions (Signed)
Call [facility] and see about getting an appointment scheduled for your thrombosed hemorrhoid if things aren't improving  [email or web address]  [address] [place], [place], [place] [address]  [phone]

## 2020-06-26 NOTE — ED Provider Notes (Signed)
[facility]    CSN: [id number] Arrival date & time: 06/26/20  1721      History   Chief Complaint Chief Complaint  Patient presents with  . Cough    HPI Meredith Byrd is a 39 y.o. female.   Here today with about 2 weeks of a worsening productive cough, wheezing, intermittent episodes of SOB, congestion. Denies known fever, chills, CP, rashes. States hx of pneumonia back in Dec when she had COVID 19 and fhx of interstitial lung dz. Has not been taking anything OTC at this time for sxs and has not yet been tested for COVID 19.   Also having 2 days of progressively worsening anal pain and swelling. Denies injury, obvious straining with BMs, drainage, bleeding, fevers. States this has never happened to her before. Trying some prep H cream without benefit and taking tylenol prn which barely touches the pain.      Past Medical History:  Diagnosis Date  . Allergy   . Anemia   . Anxiety   . Arthritis pain 08/20/2014  . Back pain   . COVID-19   . GERD (gastroesophageal reflux disease)   . History of stress test 02/2011 (GXT)   there was no evidence of ischemia, but she only went 3 1/2 minutes on the treadmill making it very difficult to get a good accurate assessment, however  . Hx of echocardiogram    The echocadiogram was essentially normal with the exception of mild mitral calcification and borderline concentric LVH, which in the setting of her hypertension at this early age is something that mean her blood pressure is well controlled.   . Hypertension   . Iron deficiency anemia, unspecified 07/09/2014  . Migraines   . Palpitations   . Swelling   . Vitamin B12 deficiency 03/17/2016    Patient Active Problem List   Diagnosis Date Noted  . On Depo-Provera for contraception 05/31/2019  . Chronic insomnia 05/17/2019  . Lumbar facet arthropathy 12/14/2018  . Dysmenorrhea, unspecified 05/16/2018  . Knee pain, left 11/17/2017  . Generalized anxiety disorder  10/04/2017  . Morning headache 05/04/2017  . Daytime somnolence 05/04/2017  . Class 3 obesity with body mass index (BMI) of 45.0 to 49.9 in adult 03/28/2017  . Chronic low back pain 01/19/2017  . Vitamin D deficiency 01/18/2017  . Vitamin B12 deficiency 03/17/2016  . Beta thalassemia trait 03/25/2015  . Intractable migraine without aura and without status migrainosus 11/23/2014  . Elevated sed rate 08/20/2014  . Arthritis pain 08/20/2014  . Iron deficiency anemia 07/09/2014  . Symptomatic anemia 06/05/2014  . Chest pain 05/01/2014  . GERD (gastroesophageal reflux disease) 03/22/2014  . S/P gastric bypass 03/22/2014  . Insomnia 03/22/2014  . Migraine 03/22/2014  . Palpitations 03/22/2014    Past Surgical History:  Procedure Laterality Date  . BREAST SURGERY    . CHOLECYSTECTOMY    . GASTRIC BYPASS  2008  . HIGH RISK BREAST EXCISION Bilateral   . OOPHORECTOMY Right 2010    OB History    Gravida  4   Para  4   Term  4   Preterm      AB      Living        SAB      TAB      Ectopic      Multiple      Live Births               Home Medications  Prior to Admission medications   Medication Sig Start Date End Date Taking? Authorizing Provider  acetaminophen (TYLENOL) 500 MG tablet Take 2 tablets (1,000 mg total) by mouth every 6 (six) hours as needed. 10/31/18   Wieters, Hallie C, PA-C  albuterol (VENTOLIN HFA) 108 (90 Base) MCG/ACT inhaler Inhale 1-2 puffs into the lungs every 6 (six) hours as needed for wheezing or shortness of breath. 06/26/20   Particia Nearing, PA-C  clonazePAM (KLONOPIN) 1 MG tablet Take 1 tablet (1 mg total) by mouth at bedtime as needed (insomnia). Future refills will need to be addressed with PCP. 07/24/19   Levert Feinstein, MD  cloNIDine (CATAPRES) 0.2 MG tablet Take 0.4 mg by mouth at bedtime. 05/20/20   [provider]  diazepam (VALIUM) 5 MG tablet Take 1 by mouth 1 hour  pre-procedure with very light food. May bring 2nd  tablet to appointment. 05/22/20   Tyrell Antonio, MD  doxycycline (VIBRAMYCIN) 100 MG capsule Take 1 capsule (100 mg total) by mouth 2 (two) times daily. 06/26/20   Particia Nearing, PA-C  Galcanezumab-gnlm (EMGALITY) 120 MG/ML SOAJ Inject 120 mg as directed every 30 (thirty) days. 05/27/20   Levert Feinstein, MD  HYDROcodone-acetaminophen (NORCO/VICODIN) 5-325 MG tablet Take 1 tablet by mouth 2 (two) times daily as needed for moderate pain. 06/26/20   Particia Nearing, PA-C  hydrocortisone (ANUSOL-HC) 2.5 % rectal cream Place 1 application rectally 2 (two) times daily. 06/26/20   Particia Nearing, PA-C  medroxyPROGESTERone Acetate 150 MG/ML SUSY INJECT 1 ML INTRAMUSCULARLY EVERY 3 MONTHS 04/16/20   [place], Betty G, MD  omeprazole (PRILOSEC) 20 MG capsule Take 1 capsule (20 mg total) by mouth 2 (two) times daily before a meal. 02/18/20   Arby Barrette, MD  omeprazole (PRILOSEC) 40 MG capsule TAKE 1 CAPSULE BY MOUTH EVERY DAY 11/07/18   [place], Betty G, MD  ondansetron (ZOFRAN ODT) 4 MG disintegrating tablet Take 1 tablet (4 mg total) by mouth every 4 (four) hours as needed for nausea or vomiting. 02/18/20   Arby Barrette, MD  sucralfate (CARAFATE) 1 GM/10ML suspension Take 10 mLs (1 g total) by mouth 4 (four) times daily -  with meals and at bedtime. 02/18/20   Arby Barrette, MD  tiZANidine (ZANAFLEX) 4 MG tablet TAKE 1 TABLET BY MOUTH TWICE A DAY AS NEEDED FOR MUSCLE SPASMS 09/16/18   [place], Betty G, MD  Ubrogepant (UBRELVY) 50 MG TABS Take 1 tab at onset of migraine.  May repeat in 2 hrs, if needed.  Max dose: 2 tabs/day. This is a 30 day prescription. 05/27/20   Levert Feinstein, MD  Vitamin D, Ergocalciferol, (DRISDOL) 1.25 MG (50000 UT) CAPS capsule TAKE 1 CAPSULE EVERY FOURTH DAY. 12/12/18   [place], Betty G, MD  zonisamide (ZONEGRAN) 100 MG capsule Take 1 capsule (100 mg total) by mouth 2 (two) times daily. 05/17/19   Levert Feinstein, MD    Family History Family History  Problem Relation Age of Onset   . Stroke Maternal Grandmother   . Heart disease Maternal Grandmother   . Hypertension Maternal Grandmother   . Hyperlipidemia Maternal Grandmother   . Breast cancer Maternal Grandmother   . Asthma Mother   . Heart disease Mother   . Diabetes Mother   . Early death Mother   . Hypertension Mother   . Thyroid disease Mother   . Anxiety disorder Mother   . Hypertension Father   . Heart disease Father   . Breast cancer Maternal Aunt  under 65  . Breast cancer Paternal Aunt     Social History Social History   Tobacco Use  . Smoking status: Never Smoker  . Smokeless tobacco: Never Used  Vaping Use  . Vaping Use: Never used  Substance Use Topics  . Alcohol use: No  . Drug use: No     Allergies   Fioricet [butalbital-apap-caffeine], Inderal [propranolol], and Nsaids   Review of Systems Review of Systems PER HPI    Physical Exam Triage Vital Signs ED Triage Vitals  Enc Vitals Group     BP 06/26/20 1929 (!) 123/93     Pulse Rate 06/26/20 1929 71     Resp 06/26/20 1929 18     Temp 06/26/20 1929 98.6 F (37 C)     Temp Source 06/26/20 1929 Oral     SpO2 06/26/20 1929 100 %     Weight 06/26/20 1931 160 lb (72.6 kg)     Height 06/26/20 1931 4\' 11"  (1.499 m)     Head Circumference --      Peak Flow --      Pain Score 06/26/20 1930 10     Pain Loc --      Pain Edu? --      Excl. in GC? --    No data found.  Updated Vital Signs BP (!) 123/93   Pulse 71   Temp 98.6 F (37 C) (Oral)   Resp 18   Ht 4\' 11"  (1.499 m)   Wt 160 lb (72.6 kg)   SpO2 100%   BMI 32.32 kg/m   Visual Acuity Right Eye Distance:   Left Eye Distance:   Bilateral Distance:    Right Eye Near:   Left Eye Near:    Bilateral Near:     Physical Exam Vitals and nursing note reviewed. Exam conducted with a chaperone present.  Constitutional:      Appearance: Normal appearance.     Comments: Standing, appears uncomfortable  HENT:     Head: Atraumatic.     Right Ear: Tympanic  membrane normal.     Left Ear: Tympanic membrane normal.     Nose: Nose normal.     Mouth/Throat:     Mouth: Mucous membranes are moist.     Pharynx: Oropharynx is clear. No posterior oropharyngeal erythema.  Eyes:     Extraocular Movements: Extraocular movements intact.     Conjunctiva/sclera: Conjunctivae normal.  Cardiovascular:     Rate and Rhythm: Normal rate and regular rhythm.     Heart sounds: Normal heart sounds.  Pulmonary:     Effort: Pulmonary effort is normal. No respiratory distress.     Breath sounds: Wheezing (b/l, mild) present.  Abdominal:     General: Bowel sounds are normal. There is no distension.     Palpations: Abdomen is soft.     Tenderness: There is no abdominal tenderness.  Genitourinary:   Musculoskeletal:        General: Normal range of motion.     Cervical back: Normal range of motion and neck supple.  Skin:    General: Skin is warm and dry.  Neurological:     Mental Status: She is alert and oriented to person, place, and time.  Psychiatric:        Mood and Affect: Mood normal.        Thought Content: Thought content normal.        Judgment: Judgment normal.      UC Treatments / Results  Labs (all labs ordered are listed, but only abnormal results are displayed) Labs Reviewed  SARS CORONAVIRUS 2 (TAT 6-24 HRS)    EKG   Radiology DG Chest 2 View  Result Date: 06/26/2020 CLINICAL DATA:  39 year old female with productive cough and wheezing for 2 weeks. EXAM: CHEST - 2 VIEW COMPARISON:  Chest radiographs 11/23/2019 and earlier. FINDINGS: Lung volumes and mediastinal contours remain normal. Visualized tracheal air column is within normal limits. Both lungs are clear. No pneumothorax or pleural effusion. Stable cholecystectomy clips. Negative visible bowel gas pattern. No acute osseous abnormality identified. IMPRESSION: Negative.  No cardiopulmonary abnormality. Electronically Signed   By: Odessa Fleming M.D.   On: 06/26/2020 20:16     Procedures Procedures (including critical care time)  Medications Ordered in UC Medications - No data to display  Initial Impression / Assessment and Plan / UC Course  I have reviewed the triage vital signs and the nursing notes.  Pertinent labs & imaging results that were available during my care of the patient were reviewed by me and considered in my medical decision making (see chart for details).     URI - will tx with doxycycline, albuterol inhaler given duration and worsening course, hx of pneumonia. CXR CTAB today and vitals reassuring.  Thrombosed hemorrhoid - anusol cream, sitz baths, norco prn for severe pain episodes (cautioned regarding sedation and addictive potential, as well as use concurrently with her prescribed BZDs). Info given for [facility] Surgery for her to call asap and schedule consultation if not resolving.   Final Clinical Impressions(s) / UC Diagnoses   Final diagnoses:  Upper respiratory tract infection, unspecified type  External hemorrhoid, thrombosed     Discharge Instructions     Call [facility] and see about getting an appointment scheduled for your thrombosed hemorrhoid if things aren't improving  [email or web address]  [address], [place], [place] [address]  [phone]    ED Prescriptions    Medication Sig Dispense Auth. Provider   doxycycline (VIBRAMYCIN) 100 MG capsule Take 1 capsule (100 mg total) by mouth 2 (two) times daily. 14 capsule Particia Nearing, [place]   HYDROcodone-acetaminophen (NORCO/VICODIN) 5-325 MG tablet  (Status: Discontinued) Take 1 tablet by mouth 2 (two) times daily as needed for moderate pain. 10 tablet Particia Nearing, [place]   hydrocortisone (ANUSOL-HC) 2.5 % rectal cream Place 1 application rectally 2 (two) times daily. 60 g Particia Nearing, [place]   HYDROcodone-acetaminophen (NORCO/VICODIN) 5-325 MG tablet Take 1 tablet by mouth 2 (two)  times daily as needed for moderate pain. 10 tablet Particia Nearing, [place]   albuterol (VENTOLIN HFA) 108 (90 Base) MCG/ACT inhaler Inhale 1-2 puffs into the lungs every 6 (six) hours as needed for wheezing or shortness of breath. 18 g Particia Nearing, [place]     I have reviewed the PDMP during this encounter.   Particia Nearing, [place] 06/26/20 2120

## 2020-06-26 NOTE — ED Triage Notes (Addendum)
Pt c/o productive cough w/yellow mucous and chest congestionx2 wks. Pt c/o slight SOBx2ks. Pt has non labored breathing. Skin color WNL. Pt states she has a growth coming out of her anus that is painfulx2 days.

## 2020-06-27 LAB — SARS CORONAVIRUS 2 (TAT 6-24 HRS): SARS Coronavirus 2: NEGATIVE

## 2020-06-29 ENCOUNTER — Other Ambulatory Visit: Payer: Self-pay

## 2020-06-29 ENCOUNTER — Ambulatory Visit
Admission: RE | Admit: 2020-06-29 | Discharge: 2020-06-29 | Disposition: A | Discharge status: Home / Self Care | Payer: 59 | Source: Ambulatory Visit | Attending: Surgery | Admitting: Surgery

## 2020-06-29 DIAGNOSIS — M25561 Pain in right knee: Secondary | ICD-10-CM

## 2020-07-09 ENCOUNTER — Ambulatory Visit: Payer: Self-pay | Admitting: Neurology

## 2020-07-12 ENCOUNTER — Telehealth: Payer: Self-pay

## 2020-07-12 ENCOUNTER — Other Ambulatory Visit: Payer: Self-pay

## 2020-07-12 ENCOUNTER — Ambulatory Visit (INDEPENDENT_AMBULATORY_CARE_PROVIDER_SITE_OTHER): Payer: 59 | Admitting: Orthopaedic Surgery

## 2020-07-12 DIAGNOSIS — M2241 Chondromalacia patellae, right knee: Secondary | ICD-10-CM | POA: Diagnosis not present

## 2020-07-12 DIAGNOSIS — M25562 Pain in left knee: Secondary | ICD-10-CM

## 2020-07-12 DIAGNOSIS — G8929 Other chronic pain: Secondary | ICD-10-CM

## 2020-07-12 DIAGNOSIS — M25561 Pain in right knee: Secondary | ICD-10-CM

## 2020-07-12 NOTE — Telephone Encounter (Signed)
Called and given below message. She verbalized understanding and will her PCP to send referral.

## 2020-07-12 NOTE — Progress Notes (Signed)
Office Visit Note   Patient: Meredith Byrd           Date of Birth: [birth year]           MRN: [id number] Visit Date: 07/12/2020              Requested by: Irena Reichmann, DO [address] [place],  [place] [address] PCP: Irena Reichmann, DO   Assessment & Plan: Visit Diagnoses:  1. Chondromalacia patellae, right knee     Plan: We reviewed MRI scan as well as her lumbar MRI scan with some facet arthropathy slightly worse on the left at L4-5.  She is having worse right knee than left knee pain at the end of the visit she requested another injection but states she like to have Zonia Kief, PA-C give her the injection.  She had hoped the MRI scan which show meniscal tear subluxing in that knee arthroscopy would fix her symptoms so she can go back to Zumba and walking the neighborhood.  I had recommended some physical therapy prior to consideration of knee arthroscopy and we discussed variable results with knee arthroscopy with arthritis in the knee.  She understands knee arthroscopy does better for meniscal tears or sublux fragments of meniscus or loose body.  We will set up for physical therapy and she can come back to see Zonia Kief, PA-C for single injection in her right knee.  She also might be a candidate for Visco injections in her knee.  Follow-Up Instructions: Return in about 6 weeks (around 08/23/2020).   Orders:  No orders of the defined types were placed in this encounter.  No orders of the defined types were placed in this encounter.     Procedures: No procedures performed   Clinical Data: No additional findings.   Subjective: Chief Complaint  Patient presents with  . Right Knee - Follow-up    HPI 39 year old female returns with ongoing problems of the right knee worse than left knee pain.  She had previous cortisone injection she states it helps only for 2 days.  Previous facet injection was not helpful in her back.  Previous gastric bypass she  lost over 100 pounds.  She states she has problems when she goes downstairs with occasional sharp pain.  She is unhappy she is not able to participate in walking the neighborhood and doing Zumba that she like to do due to their knee pain.  She not able to sit on a bicycle due to pain in the sacrum.  She has an elliptical machine but she states she gets extremely tired as soon as she starts using the elliptical and she not able to use it.  MRI scan was of obtained and is available for review.  Patient has some fraying of the posterior on the medial meniscus decreased size of the midportion medial meniscus likely reflecting a degenerative tear.  Partial-thickness tear of the medial femoral compartment with some small areas of full-thickness wear of the cartilage in the weightbearing surface.  Mild partial thickness in the lateral compartment and patellofemoral joint.  Review of Systems updated unchanged from previous visit.   Objective: Vital Signs: BP 116/83   Pulse 85   Ht 4\' 11"  (1.499 m)   Wt 160 lb (72.6 kg)   BMI 32.32 kg/m   Physical Exam Constitutional:      Appearance: She is well-developed.  HENT:     Head: Normocephalic.     Right Ear: External ear normal.  Left Ear: External ear normal.  Eyes:     Pupils: Pupils are equal, round, and reactive to light.  Neck:     Thyroid: No thyromegaly.     Trachea: No tracheal deviation.  Cardiovascular:     Rate and Rhythm: Normal rate.  Pulmonary:     Effort: Pulmonary effort is normal.  Abdominal:     Palpations: Abdomen is soft.  Skin:    General: Skin is warm and dry.  Neurological:     Mental Status: She is alert and oriented to person, place, and time.  Psychiatric:        Behavior: Behavior normal.     Ortho Exam patient does not have fusion she has internal/external rotation 34 degrees both hips without groin pain.  Negative straight leg raising 90 degrees.  She complains of pain with palpation medial joint line  lateral joint line adjacent patella over the suprapatellar pouch also the tibial tubercle with tenderness areas not well localized.  No palpable medial plica.  Negative anterior drawer.  Skin over knee is normal.  Specialty Comments:  No specialty comments available.  Imaging: Narrative & Impression  CLINICAL DATA:  Right knee pain and mechanical symptoms, failed conservative therapy.  EXAM: MRI OF THE RIGHT KNEE WITHOUT CONTRAST  TECHNIQUE: Multiplanar, multisequence MR imaging of the knee was performed. No intravenous contrast was administered.  COMPARISON:  None.  FINDINGS: MENISCI  Medial: Fraying of the free edge of the posterior horn of the medial meniscus. Diminutive size of the body of the medial meniscus likely reflecting a degenerative tear.  Lateral: Intact.  LIGAMENTS  Cruciates: ACL and PCL are intact.  Collaterals: Medial collateral ligament is intact. Lateral collateral ligament complex is intact.  CARTILAGE  Patellofemoral:  No chondral defect.  Medial: Partial-thickness cartilage loss of the medial femorotibial compartment with areas of full-thickness cartilage loss involving the weight-bearing surface of the medial femoral condyle.  Lateral: Mild partial-thickness cartilage loss of the lateral femorotibial compartment.  JOINT: No joint effusion. Normal Hoffa's fat-pad. No plical thickening.  POPLITEAL FOSSA: Popliteus tendon is intact. No Baker's cyst.  EXTENSOR MECHANISM: Intact quadriceps tendon. Intact patellar tendon. Intact lateral patellar retinaculum. Intact medial patellar retinaculum. Intact MPFL.  BONES: No aggressive osseous lesion. No fracture or dislocation.  Other: No fluid collection or hematoma. Muscles are normal.  IMPRESSION: 1. Fraying of the free edge of the posterior horn of the medial meniscus. Diminutive size of the body of the medial meniscus likely reflecting a degenerative tear. 2.  Partial-thickness cartilage loss of the medial femorotibial compartment with areas of full-thickness cartilage loss involving the weight-bearing surface of the medial femoral condyle. 3. Mild partial-thickness cartilage loss of the lateral femorotibial compartment.   Electronically Signed   By: Elige Ko   On: 06/30/2020 10:02       PMFS History: Patient Active Problem List   Diagnosis Date Noted  . 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 On Depo-Provera for contraception 05/31/2019  . Chronic insomnia 05/17/2019  . Lumbar facet arthropathy 12/14/2018  . Dysmenorrhea, unspecified 05/16/2018  . Knee pain, left 11/17/2017  . Generalized anxiety disorder 10/04/2017  . Morning headache 05/04/2017  . Daytime somnolence 05/04/2017  . Class 3 obesity with body mass index (BMI) of 45.0 to 49.9 in adult 03/28/2017  . Chronic low back pain 01/19/2017  . Vitamin D deficiency 01/18/2017  . Vitamin B12 deficiency 03/17/2016  . Beta thalassemia trait 03/25/2015  . Intractable migraine without aura and without status  migrainosus 11/23/2014  . Elevated sed rate 08/20/2014  . Arthritis pain 08/20/2014  . Iron deficiency anemia 07/09/2014  . Symptomatic anemia 06/05/2014  . Chest pain 05/01/2014  . GERD (gastroesophageal reflux disease) 03/22/2014  . S/P gastric bypass 03/22/2014  . Insomnia 03/22/2014  . Migraine 03/22/2014  . Palpitations 03/22/2014   Past Medical History:  Diagnosis Date  . Allergy   . Anemia   . Anxiety   . Arthritis pain 08/20/2014  . Back pain   . COVID-19   . GERD (gastroesophageal reflux disease)   . History of stress test 02/2011 (GXT)   there was no evidence of ischemia, but she only went 3 1/2 minutes on the treadmill making it very difficult to get a good accurate assessment, however  . Hx of echocardiogram    The echocadiogram was essentially normal with the exception of mild mitral calcification and borderline concentric LVH, which in  the setting of her hypertension at this early age is something that mean her blood pressure is well controlled.   . Hypertension   . Iron deficiency anemia, unspecified 07/09/2014  . Migraines   . Palpitations   . Swelling   . Vitamin B12 deficiency 03/17/2016    Family History  Problem Relation Age of Onset  . Stroke Maternal Grandmother   . Heart disease Maternal Grandmother   . Hypertension Maternal Grandmother   . Hyperlipidemia Maternal Grandmother   . Breast cancer Maternal Grandmother   . Asthma Mother   . Heart disease Mother   . Diabetes Mother   . Early death Mother   . Hypertension Mother   . Thyroid disease Mother   . Anxiety disorder Mother   . Hypertension Father   . Heart disease Father   . Breast cancer Maternal Aunt        under 65  . Breast cancer Paternal Aunt     Past Surgical History:  Procedure Laterality Date  . BREAST SURGERY    . CHOLECYSTECTOMY    . GASTRIC BYPASS  2008  . HIGH RISK BREAST EXCISION Bilateral   . OOPHORECTOMY Right 2010   Social History   Occupational History  . Occupation: Human resources officer  Tobacco Use  . Smoking status: Never Smoker  . Smokeless tobacco: Never Used  Vaping Use  . Vaping Use: Never used  Substance and Sexual Activity  . Alcohol use: No  . Drug use: No  . Sexual activity: Yes

## 2020-07-12 NOTE — Telephone Encounter (Signed)
She called and left a message requesting appt for IV Iron. She had recent labs and her ferritin/ iron was low. Her next day off at work is not until 11/5. She is requesting appts on that day if possible.

## 2020-07-12 NOTE — Telephone Encounter (Signed)
She has not been seen for over 3 years, so she is considered a new patient. Please ask her primary doctor to send a referral

## 2020-08-08 ENCOUNTER — Ambulatory Visit: Payer: PRIVATE HEALTH INSURANCE | Admitting: Surgery

## 2020-08-12 ENCOUNTER — Telehealth: Payer: Self-pay

## 2020-08-12 NOTE — Telephone Encounter (Signed)
Patient called in to see if dr newton had any other times on 11/5

## 2020-08-12 NOTE — Telephone Encounter (Signed)
Called pt back and resch

## 2020-08-14 ENCOUNTER — Telehealth: Payer: Self-pay | Admitting: Hematology and Oncology

## 2020-08-14 NOTE — Telephone Encounter (Signed)
Received a new hem referral from Irena Reichmann for [employer] [facility] [facility]. Pt has been cld and scheduled to see Dr. Leonides Schanz on 12/13 at 9am. Pt aware to arrive 30 minutes early.

## 2020-08-16 ENCOUNTER — Ambulatory Visit: Payer: PRIVATE HEALTH INSURANCE | Admitting: Physical Medicine and Rehabilitation

## 2020-08-20 ENCOUNTER — Ambulatory Visit: Payer: PRIVATE HEALTH INSURANCE | Admitting: Physical Medicine and Rehabilitation

## 2020-09-11 ENCOUNTER — Encounter: Payer: Self-pay | Admitting: Neurology

## 2020-09-11 ENCOUNTER — Ambulatory Visit (INDEPENDENT_AMBULATORY_CARE_PROVIDER_SITE_OTHER): Payer: 59 | Admitting: Neurology

## 2020-09-11 VITALS — BP 118/76 | Ht 59.0 in | Wt 168.4 lb

## 2020-09-11 DIAGNOSIS — G43019 Migraine without aura, intractable, without status migrainosus: Secondary | ICD-10-CM

## 2020-09-11 MED ORDER — EMGALITY 120 MG/ML ~~LOC~~ SOAJ: 120.0000 mg | SUBCUTANEOUS | 11 refills | Status: DC

## 2020-09-11 MED ORDER — UBRELVY 50 MG PO TABS: ORAL_TABLET | ORAL | 11 refills | Status: DC

## 2020-09-11 NOTE — Patient Instructions (Addendum)
Continue current medications for migraines See you back in 1 year

## 2020-09-11 NOTE — Progress Notes (Signed)
PATIENT: Meredith Byrd DOB: [birth year]  REASON FOR VISIT: follow up HISTORY FROM: patient  HISTORY OF PRESENT ILLNESS: Today 09/11/20  HISTORY HISTORY OF PRESENT ILLNESS: Meredith Byrd 39 years old right-handed African-American female, is referred by her primary care physician nurse practitioner Marva Panda for evaluation of migraine headaches.Initial evaluation was in February 2016.  She had long-standing history of migraine, since elementary school, her typical migraine are lateralized severe pounding headache with associated light noise sensitivity, nauseous, can lastfor few days, even after few weeks. She had a history of gastric bypass, lost 60 pounds in 2008, her weight has been stable  I saw her previously, while taking Topamax, her headache is much less frequent, less severe, but she has not take any preventive medications since 2014.  She now complains of increased headache frequency, since 2014, she is having 3-4 typical migraine headaches each week, each episode last for a few days, in January, she has one severe episode of migraine lasting for 2 weeks. She was not able to identify triggers, she has tried triptan treatment in the past, Imitrex, cause heart racing, tightness, Maxalt, similar side effect, without helping her headache much.  Over the past 18 months, she was given Stadol nasal spray for headaches, she gets 3 bottles of 10 mg Stadol nasal spray each month,this was prescribed by her previous primary care physician [facility], but always has recurrent headaches shortly afterwards.  UPDATE March 24th 2016: She reported only temporary partial relief with previous nerve block in February 122016, she had tried Zomig nasal spray 10 times, without helping her headaches, Previously Preventive medications, Topamax works for her headaches, but she is trying to conceiving again, does not want to take too much medications, I have went  over other possibilities including Inderal, nortriptyline, all belongs to category C, I also advised her avoiding triptan, and Stadol treatment during pregnancy,  She had 4 children, no significant headache during her previous pregnancy  UPDATE Feb 09 2017: She continue complains of frequent migraines about 10-15 days each month, sometimes preceded by visual distortion, usually short lasting, but sometimes she woke up with severe migraine headache, last visit was in March 2016, over the past 2 years, she was managed by her primary care physician, per patient, she was given prescription of Stadol, tramadol, but we were not able to find her refill information from [facility].  She states she has tried and failed multiple preventative medications, Topamax,cause too much memory issues, almost lost her job,  For abortive treatment she has tried Maxalt, Zomig, sumatriptan, none of above works for her, some of the medication even made it more intense.   UPDATE Oct 28 2017: She continue have 15-20 migraine headaches days each month, she has tried different preventive medications, Topamax, nortriptyline, Inderal, without helping her headaches,  She has suboptimal response to triptan treatment, imitrex, zomig, relpax, maxalt. 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[facility], does not want to go to [facility] or [facility] for second opinion,   UPDATE May 17 2019: She  continues to have frequent headaches, 12-15 each month, lateralized severe pounding  headache was associated light, noise sensitivity, nauseous, previously triptan was not helpful, in addition, she complains of has tried trazodone, nortriptyline,temazepam, Ambien not helping her sleep, she had sleep study in November 2018, there was no evidence of obstructive sleep apnea, previous Emgality was helping her migraine some, she was taking tramadol as needed, wants to try to Ronnette Juniper this time  Update September 11, 2020 SS: Reports 9-12 migraines a month, this is a big improvement for her, used to have severe migraine daily.  Currently taking Lin Givens for rescue.  Really pleased with current medications.  Stopped Zonegran, did not work.  PCP prescribing clonazepam, helps to sleep, less nighttime migraines.  Bernita Raisin may take 3 hours to work, then takes 2nd tablet.  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  Unsure about Botox, not fond of needles, could not finish nerve block.  REVIEW OF SYSTEMS: Out of a complete 14 system review of symptoms, the patient complains only of the following symptoms, and all other reviewed systems are negative.  Headache  ALLERGIES: Allergies  Allergen Reactions  . Doxycycline Nausea And Vomiting  . Fioricet [Butalbital-Apap-Caffeine] Other (See Comments)    GI upset  . Inderal [Propranolol] Nausea Only    Dropped BP too low  . Nsaids Nausea Only    HOME MEDICATIONS: Outpatient Medications Prior to Visit  Medication Sig Dispense Refill  . acetaminophen (TYLENOL) 500 MG tablet Take 2 tablets (1,000 mg total) by mouth every 6 (six) hours as needed. 30 tablet 0  . albuterol (VENTOLIN HFA) 108 (90 Base) MCG/ACT inhaler Inhale 1-2 puffs into the lungs every 6 (six) hours as needed for wheezing or shortness of breath. 18 g 0  . clonazePAM (KLONOPIN) 1 MG tablet Take 1 tablet (1 mg total) by mouth at bedtime as needed (insomnia). Future refills will need to be addressed with PCP. 30 tablet 3  . cloNIDine (CATAPRES) 0.2 MG tablet Take  0.4 mg by mouth at bedtime.    . diazepam (VALIUM) 5 MG tablet Take 1 by mouth 1 hour  pre-procedure with very light food. May bring 2nd tablet to appointment. 2 tablet 0  . hydrocortisone (ANUSOL-HC) 2.5 % rectal cream Place 1 application rectally 2 (two) times daily. 60 g 0  . medroxyPROGESTERone Acetate 150 MG/ML SUSY INJECT 1 ML INTRAMUSCULARLY EVERY 3 MONTHS 1 mL 3  . omeprazole (PRILOSEC) 20 MG capsule Take 1 capsule (20 mg total) by mouth 2 (two) times daily before a meal. 60 capsule 1  . omeprazole (PRILOSEC) 40 MG capsule TAKE 1 CAPSULE BY MOUTH EVERY DAY 90 capsule 1  . ondansetron (ZOFRAN ODT) 4 MG disintegrating tablet Take 1 tablet (4 mg total) by mouth every 4 (four) hours as needed for nausea or vomiting. 20 tablet 0  . sucralfate (CARAFATE) 1 GM/10ML suspension Take 10 mLs (1 g total) by mouth 4 (four) times daily -  with meals and at bedtime. 420 mL 1  . tiZANidine (ZANAFLEX) 4 MG tablet TAKE 1 TABLET BY MOUTH TWICE A DAY AS NEEDED FOR MUSCLE SPASMS 180 tablet 0  . Vitamin D, Ergocalciferol, (DRISDOL) 1.25 MG (50000 UT) CAPS capsule TAKE 1 CAPSULE EVERY FOURTH DAY. 12 capsule 1  . Galcanezumab-gnlm (EMGALITY) 120 MG/ML SOAJ Inject 120 mg as directed every 30 (thirty) days. 1 mL 11  . Ubrogepant (UBRELVY) 50 MG TABS Take 1 tab at onset of migraine.  May repeat in 2  hrs, if needed.  Max dose: 2 tabs/day. This is a 30 day prescription. 12 tablet 11  . zonisamide (ZONEGRAN) 100 MG capsule Take 1 capsule (100 mg total) by mouth 2 (two) times daily. 180 capsule 4  . doxycycline (VIBRAMYCIN) 100 MG capsule Take 1 capsule (100 mg total) by mouth 2 (two) times daily. 14 capsule 0  . HYDROcodone-acetaminophen (NORCO/VICODIN) 5-325 MG tablet Take 1 tablet by mouth 2 (two) times daily as needed for moderate pain. 10 tablet 0   No facility-administered medications prior to visit.    PAST MEDICAL HISTORY: Past Medical History:  Diagnosis Date  . Allergy   . Anemia   . Anxiety   .  Arthritis pain 08/20/2014  . Back pain   . COVID-19   . GERD (gastroesophageal reflux disease)   . History of stress test 02/2011 ([facility])   there was no evidence of ischemia, but she only went 3 1/2 minutes on the treadmill making it very difficult to get a good accurate assessment, however  . Hx of echocardiogram    The echocadiogram was essentially normal with the exception of mild mitral calcification and borderline concentric LVH, which in the setting of her hypertension at this early age is something that mean her blood pressure is well controlled.   . Hypertension   . Iron deficiency anemia, unspecified 07/09/2014  . Migraines   . Palpitations   . Swelling   . Vitamin B12 deficiency 03/17/2016    PAST SURGICAL HISTORY: Past Surgical History:  Procedure Laterality Date  . BREAST SURGERY    . CHOLECYSTECTOMY    . GASTRIC BYPASS  2008  . HIGH RISK BREAST EXCISION Bilateral   . OOPHORECTOMY Right 2010    FAMILY HISTORY: Family History  Problem Relation Age of Onset  . Stroke Maternal Grandmother   . Heart disease Maternal Grandmother   . Hypertension Maternal Grandmother   . Hyperlipidemia Maternal Grandmother   . Breast cancer Maternal Grandmother   . Asthma Mother   . Heart disease Mother   . Diabetes Mother   . Early death Mother   . Hypertension Mother   . Thyroid disease Mother   . Anxiety disorder Mother   . Hypertension Father   . Heart disease Father   . Breast cancer Maternal Aunt        under 65  . Breast cancer Paternal Aunt     SOCIAL HISTORY: Social History   Socioeconomic History  . Marital status: Married    Spouse name: Damon  . Number of children: 4  . Years of education: Associates  . Highest education level: Not on file  Occupational History  . Occupation: Human resources officer  Tobacco Use  . Smoking status: Never Smoker  . Smokeless tobacco: Never Used  Vaping Use  . Vaping Use: Never used  Substance and Sexual Activity  . Alcohol use: No   . Drug use: No  . Sexual activity: Yes  Other Topics Concern  . Not on file  Social History Narrative   Right handed.   Lives at home with husband and four children.   No caffeine use.   Social Determinants of Health   Financial Resource Strain:   . Difficulty of Paying Living Expenses: Not on file  Food Insecurity:   . Worried About Programme researcher, broadcasting/film/video in the Last Year: Not on file  . Ran Out of Food in the Last Year: Not on file  Transportation Needs:   . Lack of  Transportation (Medical): Not on file  . Lack of Transportation (Non-Medical): Not on file  Physical Activity:   . Days of Exercise per Week: Not on file  . Minutes of Exercise per Session: Not on file  Stress:   . Feeling of Stress : Not on file  Social Connections:   . Frequency of Communication with Friends and Family: Not on file  . Frequency of Social Gatherings with Friends and Family: Not on file  . Attends Religious Services: Not on file  . Active Member of Clubs or Organizations: Not on file  . Attends BankerClub or Organization Meetings: Not on file  . Marital Status: Not on file  Intimate Partner Violence:   . Fear of Current or Ex-Partner: Not on file  . Emotionally Abused: Not on file  . Physically Abused: Not on file  . Sexually Abused: Not on file   PHYSICAL EXAM  Vitals:   09/11/20 0746  BP: 118/76  Weight: 168 lb 6.4 oz (76.4 kg)  Height: 4\' 11"  (1.499 m)   Body mass index is 34.01 kg/m.  Generalized: Well developed, in no acute distress   Neurological examination  Mentation: Alert oriented to time, place, history taking. Follows all commands speech and language fluent Cranial nerve II-XII: Pupils were equal round reactive to light. Extraocular movements were full, visual field were full on confrontational test. Facial sensation and strength were normal.Head turning and shoulder shrug  were normal and symmetric. Motor: The motor testing reveals 5 over 5 strength of all 4 extremities. Good  symmetric motor tone is noted throughout.  Sensory: Sensory testing is intact to soft touch on all 4 extremities. No evidence of extinction is noted.  Coordination: Cerebellar testing reveals good finger-nose-finger and heel-to-shin bilaterally.  Gait and station: Gait is normal. Reflexes: Deep tendon reflexes are symmetric and normal bilaterally.   DIAGNOSTIC DATA (LABS, IMAGING, TESTING) - I reviewed patient records, labs, notes, testing and imaging myself where available.  Lab Results  Component Value Date   WBC 4.1 05/28/2020   HGB 10.3 (L) 05/28/2020   HCT 35.5 05/28/2020   MCV 75.2 (L) 05/28/2020   PLT 517 (H) 05/28/2020      Component Value Date/Time   NA 137 02/17/2020 2005   NA 136 06/29/2017 0917   K 4.4 02/17/2020 2005   CL 101 02/17/2020 2005   CO2 22 02/17/2020 2005   GLUCOSE 94 02/17/2020 2005   BUN 5 (L) 02/17/2020 2005   BUN 7 06/29/2017 0917   CREATININE 0.60 02/17/2020 2005   CREATININE 0.65 03/22/2014 1217   CALCIUM 9.4 02/17/2020 2005   PROT 7.9 02/17/2020 2005   PROT 6.7 06/29/2017 0917   ALBUMIN 4.1 02/17/2020 2005   ALBUMIN 3.8 06/29/2017 0917   AST 35 02/17/2020 2005   ALT 27 02/17/2020 2005   ALKPHOS 55 02/17/2020 2005   BILITOT 0.8 02/17/2020 2005   BILITOT 0.4 06/29/2017 0917   GFRNONAA >60 02/17/2020 2005   GFRNONAA >89 03/22/2014 1217   GFRAA >60 02/17/2020 2005   GFRAA >89 03/22/2014 1217   Lab Results  Component Value Date   CHOL 109 06/29/2017   HDL 46 06/29/2017   LDLCALC 47 06/29/2017   TRIG 81 06/29/2017   CHOLHDL 2 01/19/2017   Lab Results  Component Value Date   HGBA1C 4.8 06/29/2017   Lab Results  Component Value Date   VITAMINB12 150 (L) 08/22/2018   Lab Results  Component Value Date   TSH 0.708 06/29/2017   ASSESSMENT  AND PLAN 39 y.o. year old female  has a past medical history of Allergy, Anemia, Anxiety, Arthritis pain (08/20/2014), Back pain, COVID-19, GERD (gastroesophageal reflux disease), History of stress  test (02/2011 ([facility])), echocardiogram, Hypertension, Iron deficiency anemia, unspecified (07/09/2014), Migraines, Palpitations, Swelling, and Vitamin B12 deficiency (03/17/2016). here with:  1.  Chronic migraine headaches -Tried and failed multiple preventative medications (Topamax, Inderal, nortriptyline, CCB, Zonegran) -Suboptimal response to triptan treatment, complains of side effect (low BP, heart palpitation) -Continue Emgality 120 mg injection for migraine prevention -Continue Ubrelvy as needed for acute headache -Follow-up in 1 year or sooner if needed  2.  Chronic insomnia -Tried and failed multiple different medications (Ambien, temazepam, trazodone) -Doing well on clonazepam from PCP  I spent 20 minutes of face-to-face and non-face-to-face time with patient.  This included previsit chart review, lab review, study review, order entry, electronic health record documentation, patient education.  Margie Ege, AGNP-C, DNP 09/11/2020, 8:14 AM [facility] [address] [place], [place] [address] [phone]

## 2020-09-23 ENCOUNTER — Other Ambulatory Visit: Payer: Self-pay

## 2020-09-23 ENCOUNTER — Inpatient Hospital Stay: Payer: 59

## 2020-09-23 ENCOUNTER — Inpatient Hospital Stay: Payer: 59 | Attending: Hematology and Oncology | Admitting: Hematology and Oncology

## 2020-09-23 ENCOUNTER — Other Ambulatory Visit: Payer: Self-pay | Admitting: Hematology and Oncology

## 2020-09-23 ENCOUNTER — Encounter: Payer: Self-pay | Admitting: Hematology and Oncology

## 2020-09-23 VITALS — BP 106/75 | HR 93 | Temp 99.6°F | Resp 19 | Ht 59.0 in | Wt 165.9 lb

## 2020-09-23 DIAGNOSIS — D563 Thalassemia minor: Secondary | ICD-10-CM | POA: Diagnosis not present

## 2020-09-23 DIAGNOSIS — D500 Iron deficiency anemia secondary to blood loss (chronic): Secondary | ICD-10-CM

## 2020-09-23 DIAGNOSIS — D509 Iron deficiency anemia, unspecified: Secondary | ICD-10-CM | POA: Insufficient documentation

## 2020-09-23 DIAGNOSIS — Z3042 Encounter for surveillance of injectable contraceptive: Secondary | ICD-10-CM

## 2020-09-23 DIAGNOSIS — Z9884 Bariatric surgery status: Secondary | ICD-10-CM | POA: Insufficient documentation

## 2020-09-23 DIAGNOSIS — N920 Excessive and frequent menstruation with regular cycle: Secondary | ICD-10-CM | POA: Insufficient documentation

## 2020-09-23 LAB — RETIC PANEL
Immature Retic Fract: 19.8 % — ABNORMAL HIGH (ref 2.3–15.9)
RBC.: 4.98 MIL/uL (ref 3.87–5.11)
Retic Count, Absolute: 64.2 10*3/uL (ref 19.0–186.0)
Retic Ct Pct: 1.3 % (ref 0.4–3.1)
Reticulocyte Hemoglobin: 26.7 pg — ABNORMAL LOW (ref >27.9)

## 2020-09-23 LAB — CMP (CANCER CENTER ONLY)
ALT: 18 U/L (ref 0–44)
AST: 19 U/L (ref 15–41)
Albumin: 4.2 g/dL (ref 3.5–5.0)
Alkaline Phosphatase: 68 U/L (ref 38–126)
Anion gap: 9 (ref 5–15)
BUN: 8 mg/dL (ref 6–20)
CO2: 24 mmol/L (ref 22–32)
Calcium: 9.6 mg/dL (ref 8.9–10.3)
Chloride: 106 mmol/L (ref 98–111)
Creatinine: 0.73 mg/dL (ref 0.44–1.00)
GFR, Estimated: >60 mL/min (ref >60)
Glucose, Bld: 81 mg/dL (ref 70–99)
Potassium: 3.6 mmol/L (ref 3.5–5.1)
Sodium: 139 mmol/L (ref 135–145)
Total Bilirubin: 0.6 mg/dL (ref 0.3–1.2)
Total Protein: 8.5 g/dL — ABNORMAL HIGH (ref 6.5–8.1)

## 2020-09-23 LAB — CBC WITH DIFFERENTIAL (CANCER CENTER ONLY)
Abs Immature Granulocytes: 0.01 10*3/uL (ref 0.00–0.07)
Basophils Absolute: 0 10*3/uL (ref 0.0–0.1)
Basophils Relative: 0 %
Eosinophils Absolute: 0 10*3/uL (ref 0.0–0.5)
Eosinophils Relative: 0 %
HCT: 36.7 % (ref 36.0–46.0)
Hemoglobin: 11.4 g/dL — ABNORMAL LOW (ref 12.0–15.0)
Immature Granulocytes: 0 %
Lymphocytes Relative: 37 %
Lymphs Abs: 1.8 10*3/uL (ref 0.7–4.0)
MCH: 23.3 pg — ABNORMAL LOW (ref 26.0–34.0)
MCHC: 31.1 g/dL (ref 30.0–36.0)
MCV: 74.9 fL — ABNORMAL LOW (ref 80.0–100.0)
Monocytes Absolute: 0.3 10*3/uL (ref 0.1–1.0)
Monocytes Relative: 5 %
Neutro Abs: 2.8 10*3/uL (ref 1.7–7.7)
Neutrophils Relative %: 58 %
Platelet Count: 530 10*3/uL — ABNORMAL HIGH (ref 150–400)
RBC: 4.9 MIL/uL (ref 3.87–5.11)
RDW: 17 % — ABNORMAL HIGH (ref 11.5–15.5)
WBC Count: 5 10*3/uL (ref 4.0–10.5)
nRBC: 0 % (ref 0.0–0.2)

## 2020-09-23 LAB — FERRITIN: Ferritin: 9 ng/mL — ABNORMAL LOW (ref 11–307)

## 2020-09-23 LAB — IRON AND TIBC
Iron: 38 ug/dL — ABNORMAL LOW (ref 41–142)
Saturation Ratios: 7 % — ABNORMAL LOW (ref 21–57)
TIBC: 531 ug/dL — ABNORMAL HIGH (ref 236–444)
UIBC: 493 ug/dL — ABNORMAL HIGH (ref 120–384)

## 2020-09-23 LAB — URINALYSIS, COMPLETE (UACMP) WITH MICROSCOPIC
Bacteria, UA: NONE SEEN
Bilirubin Urine: NEGATIVE
Glucose, UA: NEGATIVE mg/dL
Hgb urine dipstick: NEGATIVE
Ketones, ur: NEGATIVE mg/dL
Leukocytes,Ua: NEGATIVE
Nitrite: NEGATIVE
Protein, ur: NEGATIVE mg/dL
Specific Gravity, Urine: 1.023 (ref 1.005–1.030)
pH: 7 (ref 5.0–8.0)

## 2020-09-23 LAB — VITAMIN B12: Vitamin B-12: 133 pg/mL — ABNORMAL LOW (ref 180–914)

## 2020-09-23 LAB — PROTIME-INR
INR: 1 (ref 0.8–1.2)
Prothrombin Time: 12.6 seconds (ref 11.4–15.2)

## 2020-09-23 LAB — FOLATE: Folate: 7.6 ng/mL (ref >5.9)

## 2020-09-23 LAB — APTT: aPTT: 34 seconds (ref 24–36)

## 2020-09-23 LAB — LACTATE DEHYDROGENASE: LDH: 163 U/L (ref 98–192)

## 2020-09-23 LAB — SAVE SMEAR(SSMR), FOR PROVIDER SLIDE REVIEW

## 2020-09-23 NOTE — Progress Notes (Signed)
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 Leonides Schanz   CHIEF COMPLAINTS/PURPOSE OF CONSULTATION:  "Iron Deficiency Anemia "  HISTORY OF PRESENTING ILLNESS:  Meredith Byrd 39 y.o. female with medical history significant for beta thalassemia trait, GERD, HTN, migraines, and vitamin b12 deficiency who presents for evaluation of longstanding iron deficiency anemia.   On review of the previous records [name] has iron deficiency anemia dating back to at least 10/08/2009.  At that time she was noted to have a hemoglobin of 10.2 and an MCV of 73.1.  She did have normal levels of hemoglobin on 03/25/2015 at a hemoglobin of 12.5 but still had microcytosis with an MCV of 77.2 and thrombocytosis with a platelet count of 419.  Most recently on 05/28/2020 the patient was found to have a white blood cell count of 4.1, hemoglobin 10.3, MCV of 75.2, and a platelet count of 517.  Due to concern for this patient's longstanding iron deficiency anemia she was referred to hematology for further evaluation and management.  On exam today [name] reports that she has been having problems with her blood "since I was a small child".  She notes that she has not had any treatments for her anemia, though she does endorse having been trialed on p.o. iron previously but that they caused a great deal of constipation.  She reports  having undergone a gastric bypass in the year 2008 and lost 100 pounds in the process.  She reports eating a regular diet with no dietary restrictions.  On further discussion she notes that she has not gone through any menstrual cycles in the last 20 years as she is currently on Depo-Provera shots.  She does endorse bruising easily and that she has chronic pain and swelling in her arms and upper and lower extremities.  She notes that her stools can be dark, but not jet black.  In terms of symptoms she notes that she does have issues with fatigue and that she is "always tired and does not get enough sleep" and that she does have periodic ice cravings.  She does endorse having increased bouts of urination and was previously seen by urology for this but no longer is under their care as she "does not feel that they care about her symptoms".  She currently denies any fevers, chills, sweats, nausea, vomiting or diarrhea.  A full 10 point ROS is listed below.    MEDICAL HISTORY:  Past Medical History:  Diagnosis Date  . Allergy   . Anemia   . Anxiety   . Arthritis pain 08/20/2014  . Back pain   . COVID-19   . GERD (gastroesophageal reflux disease)   . History of stress test 02/2011 ([facility])   there was no evidence of ischemia, but she only went 3 1/2 minutes on the treadmill making it very difficult to get a good accurate assessment, however  . Hx of echocardiogram    The echocadiogram was essentially normal with  the exception of mild mitral calcification and borderline concentric LVH, which in the setting of her hypertension at this early age is something that mean her blood pressure is well controlled.   . Hypertension   . Iron deficiency anemia, unspecified 07/09/2014  . Migraines   . Palpitations   . Swelling   . Vitamin B12 deficiency 03/17/2016    SURGICAL HISTORY: Past Surgical History:  Procedure Laterality Date  . BREAST SURGERY    . CHOLECYSTECTOMY    . GASTRIC BYPASS  2008  . HIGH RISK  BREAST EXCISION Bilateral   . OOPHORECTOMY Right 2010    SOCIAL HISTORY: Social History   Socioeconomic History  . Marital status: Married    Spouse name: Damon  . Number of children: 4  . Years of education: Associates  . Highest education level: Not on file  Occupational History  . Occupation: Human resources officer  Tobacco Use  . Smoking status: Never Smoker  . Smokeless tobacco: Never Used  Vaping Use  . Vaping Use: Never used  Substance and Sexual Activity  . Alcohol use: No  . Drug use: No  . Sexual activity: Yes  Other Topics Concern  . Not on file  Social History Narrative   Right handed.   Lives at home with husband and four children.   No caffeine use.   Social Determinants of Health   Financial Resource Strain: Not on file  Food Insecurity: Not on file  Transportation Needs: Not on file  Physical Activity: Not on file  Stress: Not on file  Social Connections: Not on file  Intimate Partner Violence: Not on file    FAMILY HISTORY: Family History  Problem Relation Age of Onset  . Stroke Maternal Grandmother   . Heart disease Maternal Grandmother   . Hypertension Maternal Grandmother   . Hyperlipidemia Maternal Grandmother   . Breast cancer Maternal Grandmother   . Asthma Mother   . Heart disease Mother   . Diabetes Mother   . Early death Mother   . Hypertension Mother   . Thyroid disease Mother   . Anxiety disorder Mother   . Hypertension Father   . Heart disease Father   . Breast cancer Maternal Aunt        under 65  . Breast cancer Paternal Aunt     ALLERGIES:  is allergic to doxycycline, fioricet [butalbital-apap-caffeine], inderal [propranolol], and nsaids.  MEDICATIONS:  Current Outpatient Medications  Medication Sig Dispense Refill  . acetaminophen (TYLENOL) 500 MG tablet Take 2 tablets (1,000 mg total) by mouth every 6 (six) hours as needed. 30 tablet 0  . albuterol (VENTOLIN HFA) 108 (90 Base) MCG/ACT inhaler Inhale 1-2 puffs into the  lungs every 6 (six) hours as needed for wheezing or shortness of breath. 18 g 0  . clonazePAM (KLONOPIN) 1 MG tablet Take 1 tablet (1 mg total) by mouth at bedtime as needed (insomnia). Future refills will need to be addressed with PCP. 30 tablet 3  . cloNIDine (CATAPRES) 0.2 MG tablet Take 0.4 mg by mouth at bedtime.    . diazepam (VALIUM) 5 MG tablet Take 1 by mouth 1 hour  pre-procedure with very light food. May bring 2nd tablet to appointment. 2 tablet 0  . Galcanezumab-gnlm (EMGALITY) 120 MG/ML SOAJ Inject 120 mg as directed every 30 (thirty) days. 1 mL 11  . hydrocortisone (ANUSOL-HC) 2.5 % rectal cream Place 1 application rectally 2 (two) times daily. 60 g 0  . medroxyPROGESTERone Acetate 150 MG/ML SUSY  INJECT 1 ML INTRAMUSCULARLY EVERY 3 MONTHS 1 mL 3  . omeprazole (PRILOSEC) 20 MG capsule Take 1 capsule (20 mg total) by mouth 2 (two) times daily before a meal. 60 capsule 1  . omeprazole (PRILOSEC) 40 MG capsule TAKE 1 CAPSULE BY MOUTH EVERY DAY 90 capsule 1  . ondansetron (ZOFRAN ODT) 4 MG disintegrating tablet Take 1 tablet (4 mg total) by mouth every 4 (four) hours as needed for nausea or vomiting. 20 tablet 0  . sucralfate (CARAFATE) 1 GM/10ML suspension Take 10 mLs (1 g total) by mouth 4 (four) times daily -  with meals and at bedtime. 420 mL 1  . tiZANidine (ZANAFLEX) 4 MG tablet TAKE 1 TABLET BY MOUTH TWICE A DAY AS NEEDED FOR MUSCLE SPASMS 180 tablet 0  . Ubrogepant (UBRELVY) 50 MG TABS Take 1 tab at onset of migraine.  May repeat in 2 hrs, if needed.  Max dose: 2 tabs/day. This is a 30 day prescription. 12 tablet 11  . Vitamin D, Ergocalciferol, (DRISDOL) 1.25 MG (50000 UT) CAPS capsule TAKE 1 CAPSULE EVERY FOURTH DAY. 12 capsule 1   No current facility-administered medications for this visit.    REVIEW OF SYSTEMS:   Constitutional: ( - ) fevers, ( - )  chills , ( - ) night sweats Eyes: ( - ) blurriness of vision, ( - ) double vision, ( - ) watery eyes Ears, nose, mouth, throat,  and face: ( - ) mucositis, ( - ) sore throat Respiratory: ( - ) cough, ( - ) dyspnea, ( - ) wheezes Cardiovascular: ( - ) palpitation, ( - ) chest discomfort, ( - ) lower extremity swelling Gastrointestinal:  ( - ) nausea, ( - ) heartburn, ( - ) change in bowel habits Skin: ( - ) abnormal skin rashes Lymphatics: ( - ) new lymphadenopathy, ( - ) easy bruising Neurological: ( - ) numbness, ( - ) tingling, ( - ) new weaknesses Behavioral/Psych: ( - ) mood change, ( - ) new changes  All other systems were reviewed with the patient and are negative.  PHYSICAL EXAMINATION:  Vitals:   09/23/20 0841  BP: 106/75  Pulse: 93  Resp: 19  Temp: 99.6 F (37.6 C)  SpO2: 100%   Filed Weights   09/23/20 0841  Weight: 165 lb 14.4 oz (75.3 kg)    GENERAL: well appearing middle aged [place] American female in NAD  SKIN: skin color, texture, turgor are normal, no rashes or significant lesions EYES: conjunctiva are pink and non-injected, sclera clear LUNGS: clear to auscultation and percussion with normal breathing effort HEART: regular rate & rhythm and no murmurs and no lower extremity edema Musculoskeletal: no cyanosis of digits and no clubbing  PSYCH: alert & oriented x 3, fluent speech NEURO: no focal motor/sensory deficits  LABORATORY DATA:  I have reviewed the data as listed CBC Latest Ref Rng & Units 05/28/2020 02/17/2020 11/23/2019  WBC 3.8 - 10.8 Thousand/uL 4.1 5.1 3.3(L)  Hemoglobin 11.7 - 15.5 g/dL 10.3(L) 10.9(L) 10.4(L)  Hematocrit 35.0 - 45.0 % 35.5 36.6 33.8(L)  Platelets 140 - 400 Thousand/uL 517(H) 533(H) 447(H)    CMP Latest Ref Rng & Units 02/17/2020 11/23/2019 06/29/2017  Glucose 70 - 99 mg/dL 94 161(W) 85  BUN 6 - 20 mg/dL 5(L) <9(U) 7  Creatinine 0.44 - 1.00 mg/dL 0.45 4.09 8.11  Sodium 135 - 145 mmol/L 137 140 136  Potassium 3.5 - 5.1 mmol/L 4.4 3.3(L) 4.6  Chloride 98 - 111 mmol/L 101  105 100  CO2 22 - 32 mmol/L 22 25 23   Calcium 8.9 - 10.3 mg/dL 9.4 9.0 9.3  Total  Protein 6.5 - 8.1 g/dL 7.9 - 6.7  Total Bilirubin 0.3 - 1.2 mg/dL 0.8 - 0.4  Alkaline Phos 38 - 126 U/L 55 - 72  AST 15 - 41 U/L 35 - 21  ALT 0 - 44 U/L 27 - 32    RADIOGRAPHIC STUDIES: No results found.  ASSESSMENT & PLAN Foster M Free 39 y.o. female with medical history significant for beta thalassemia trait, GERD, HTN, migraines, and vitamin b12 deficiency who presents for evaluation of longstanding iron deficiency anemia.  After review the labs, review the records, and discussion with the patient the findings are most consistent with a longstanding iron deficiency anemia.  The patient does report having heavy menstrual cycles prior to undergoing her Depo-Provera shots.  Additionally she has had a gastric bypass surgery in 2008 and therefore may have been iron deficient prior to the surgery with no additional incoming iron after the procedure.  There may also be a component of beta thalassemia trait causing the patient's microcytosis.  As such at this time I would recommend a full iron and nutritional panel in order to evaluate the patient's anemia.  In the event the patient is found to be iron deficient I would recommend bypassing p.o. iron given her surgery and proceeding with IV iron therapy alone.  My preference would be for IV Feraheme 510 mg q. 7 days x 2 doses.  Once the patient is receive these dosages we will see her back in 6 weeks time to assure it is been effective.  The patient voiced her understanding of this plan moving forward and was agreeable to it.  # Iron Deficiency Anemia 2/2 to Gastric Bipass Surgery --Findings are most consistent with iron deficiency anemia secondary to poor absorption in the setting of gastric bypass surgery.  There may also be a component of beta thalassemia trait causing her lab findings. --Today we will check CMP, CBC, iron panel, and ferritin. --Additional labs to include copper, folate, and vitamin B12 which can be low in the setting of gastric  bypass surgery. --Given the patient's reports of easy bruising we will order a P PT and PT/INR --If patient is found to have low levels of iron would recommend proceeding with Feraheme 510 mg IV q. 7 days x 2 doses --Return to clinic approximately 6 weeks after last dose of IV iron.  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   No orders of the defined types were placed in this encounter.  All questions were answered. The patient knows to call the clinic with any problems, questions or concerns.  A total of more than 60 minutes were spent on this encounter and over half of that time was spent on counseling and coordination of care as outlined above.   Ulysees BarnsJohn T. Bijou Easler, MD Department of Hematology/Oncology [facility] Phone: [phone] Pager: [phone] Email: [email or web address]  09/23/2020 8:45 AM

## 2020-09-24 ENCOUNTER — Ambulatory Visit: Payer: PRIVATE HEALTH INSURANCE | Admitting: Orthopaedic Surgery

## 2020-09-25 LAB — COPPER, SERUM: Copper: 126 ug/dL (ref 80–158)

## 2020-09-26 ENCOUNTER — Other Ambulatory Visit: Payer: Self-pay | Admitting: Hematology and Oncology

## 2020-09-30 ENCOUNTER — Inpatient Hospital Stay: Payer: 59

## 2020-09-30 ENCOUNTER — Other Ambulatory Visit: Payer: Self-pay

## 2020-09-30 ENCOUNTER — Telehealth: Payer: Self-pay | Admitting: *Deleted

## 2020-09-30 VITALS — BP 93/63 | HR 72 | Temp 98.8°F | Resp 18

## 2020-09-30 DIAGNOSIS — D509 Iron deficiency anemia, unspecified: Secondary | ICD-10-CM | POA: Diagnosis not present

## 2020-09-30 DIAGNOSIS — D508 Other iron deficiency anemias: Secondary | ICD-10-CM

## 2020-09-30 MED ORDER — CYANOCOBALAMIN 1000 MCG/ML IJ SOLN
INTRAMUSCULAR | Status: AC
  Filled 2020-09-30: qty 1

## 2020-09-30 MED ORDER — CYANOCOBALAMIN 1000 MCG/ML IJ SOLN
1000.0000 ug | Freq: Once | INTRAMUSCULAR | Status: AC
  Administered 2020-09-30: 1000 ug via INTRAMUSCULAR

## 2020-09-30 MED ORDER — SODIUM CHLORIDE 0.9 % IV SOLN
510.0000 mg | Freq: Once | INTRAVENOUS | Status: AC
  Administered 2020-09-30: 510 mg via INTRAVENOUS
  Filled 2020-09-30: qty 17

## 2020-09-30 MED ORDER — SODIUM CHLORIDE 0.9 % IV SOLN
Freq: Once | INTRAVENOUS | Status: AC
  Filled 2020-09-30: qty 250

## 2020-09-30 NOTE — Telephone Encounter (Signed)
-----   Message from Jaci Standard, MD sent at 09/26/2020  4:45 PM EST ----- Please let Meredith Byrd know that she has both low iron and low vitamin b12. Will have her scheduled for IV iron and vitamin b12 shots to start on 09/30/2020. Both of these are likely low due to her gastric bipass.   ----- Message ----- From: Leory Plowman, Lab In [place] Sent: 09/23/2020  10:06 AM EST To: Jaci Standard, MD

## 2020-09-30 NOTE — Patient Instructions (Signed)

## 2020-09-30 NOTE — Telephone Encounter (Signed)
TCT patient regarding recent lab results. Spoke with her and advised that both her iron and B12 levels are low. She is aware of her appts starting today for her IV iron and B12 injections.  No questions or concerns

## 2020-10-02 ENCOUNTER — Ambulatory Visit (INDEPENDENT_AMBULATORY_CARE_PROVIDER_SITE_OTHER): Payer: 59 | Admitting: Surgery

## 2020-10-02 ENCOUNTER — Other Ambulatory Visit: Payer: Self-pay

## 2020-10-02 DIAGNOSIS — Z5329 Procedure and treatment not carried out because of patient's decision for other reasons: Secondary | ICD-10-CM

## 2020-10-07 ENCOUNTER — Inpatient Hospital Stay: Payer: 59

## 2020-10-07 ENCOUNTER — Other Ambulatory Visit: Payer: Self-pay

## 2020-10-07 VITALS — BP 94/57 | HR 72 | Temp 98.5°F | Resp 17 | Wt 168.8 lb

## 2020-10-07 DIAGNOSIS — D509 Iron deficiency anemia, unspecified: Secondary | ICD-10-CM | POA: Diagnosis not present

## 2020-10-07 DIAGNOSIS — D508 Other iron deficiency anemias: Secondary | ICD-10-CM

## 2020-10-07 MED ORDER — SODIUM CHLORIDE 0.9 % IV SOLN
510.0000 mg | Freq: Once | INTRAVENOUS | Status: AC
  Administered 2020-10-07: 510 mg via INTRAVENOUS
  Filled 2020-10-07: qty 510

## 2020-10-07 MED ORDER — PROCHLORPERAZINE MALEATE 10 MG PO TABS
ORAL_TABLET | ORAL | Status: AC
  Filled 2020-10-07: qty 1

## 2020-10-07 MED ORDER — SODIUM CHLORIDE 0.9 % IV SOLN
Freq: Once | INTRAVENOUS | Status: AC
  Filled 2020-10-07: qty 250

## 2020-10-07 MED ORDER — CYANOCOBALAMIN 1000 MCG/ML IJ SOLN
1000.0000 ug | Freq: Once | INTRAMUSCULAR | Status: AC
  Administered 2020-10-07: 1000 ug via INTRAMUSCULAR

## 2020-10-07 MED ORDER — PROCHLORPERAZINE MALEATE 10 MG PO TABS
10.0000 mg | ORAL_TABLET | Freq: Once | ORAL | Status: AC
  Administered 2020-10-07: 10 mg via ORAL

## 2020-10-07 MED ORDER — CYANOCOBALAMIN 1000 MCG/ML IJ SOLN
INTRAMUSCULAR | Status: AC
  Filled 2020-10-07: qty 1

## 2020-10-07 MED ORDER — ACETAMINOPHEN 325 MG PO TABS
650.0000 mg | ORAL_TABLET | Freq: Once | ORAL | Status: AC
  Administered 2020-10-07: 650 mg via ORAL

## 2020-10-07 MED ORDER — ACETAMINOPHEN 325 MG PO TABS
ORAL_TABLET | ORAL | Status: AC
  Filled 2020-10-07: qty 2

## 2020-10-07 NOTE — Progress Notes (Signed)
After starting feraheme, patient began c/o severe headache.  Feraheme paused.  PA Marga Hoots notified.  V.O. received for Tylenol po 650mg  and Compazine po 10mg .  Medications administered.  VSS.

## 2020-10-07 NOTE — Patient Instructions (Signed)
Ferumoxytol injection What is this medicine? FERUMOXYTOL is an iron complex. Iron is used to make healthy red blood cells, which carry oxygen and nutrients throughout the body. This medicine is used to treat iron deficiency anemia. This medicine may be used for other purposes; ask your health care provider or pharmacist if you have questions. COMMON BRAND NAME(S): Feraheme What should I tell my health care provider before I take this medicine? They need to know if you have any of these conditions:  anemia not caused by low iron levels  high levels of iron in the blood  magnetic resonance imaging (MRI) test scheduled  an unusual or allergic reaction to iron, other medicines, foods, dyes, or preservatives  pregnant or trying to get pregnant  breast-feeding How should I use this medicine? This medicine is for injection into a vein. It is given by a health care professional in a hospital or clinic setting. Talk to your pediatrician regarding the use of this medicine in children. Special care may be needed. Overdosage: If you think you have taken too much of this medicine contact a poison control center or emergency room at once. NOTE: This medicine is only for you. Do not share this medicine with others. What if I miss a dose? It is important not to miss your dose. Call your doctor or health care professional if you are unable to keep an appointment. What may interact with this medicine? This medicine may interact with the following medications:  other iron products This list may not describe all possible interactions. Give your health care provider a list of all the medicines, herbs, non-prescription drugs, or dietary supplements you use. Also tell them if you smoke, drink alcohol, or use illegal drugs. Some items may interact with your medicine. What should I watch for while using this medicine? Visit your doctor or healthcare professional regularly. Tell your doctor or healthcare  professional if your symptoms do not start to get better or if they get worse. You may need blood work done while you are taking this medicine. You may need to follow a special diet. Talk to your doctor. Foods that contain iron include: whole grains/cereals, dried fruits, beans, or peas, leafy green vegetables, and organ meats (liver, kidney). What side effects may I notice from receiving this medicine? Side effects that you should report to your doctor or health care professional as soon as possible:  allergic reactions like skin rash, itching or hives, swelling of the face, lips, or tongue  breathing problems  changes in blood pressure  feeling faint or lightheaded, falls  fever or chills  flushing, sweating, or hot feelings  swelling of the ankles or feet Side effects that usually do not require medical attention (report to your doctor or health care professional if they continue or are bothersome):  diarrhea  headache  nausea, vomiting  stomach pain This list may not describe all possible side effects. Call your doctor for medical advice about side effects. You may report side effects to FDA at 1-800-FDA-1088. Where should I keep my medicine? This drug is given in a hospital or clinic and will not be stored at home. NOTE: This sheet is a summary. It may not cover all possible information. If you have questions about this medicine, talk to your doctor, pharmacist, or health care provider.  2020 Elsevier/Gold Standard (2016-11-16 20:21:10)  Cyanocobalamin, Vitamin B12 injection What is this medicine? CYANOCOBALAMIN (sye an oh koe BAL a min) is a man made form of vitamin   B12. Vitamin B12 is used in the growth of healthy blood cells, nerve cells, and proteins in the body. It also helps with the metabolism of fats and carbohydrates. This medicine is used to treat people who can not absorb vitamin B12. This medicine may be used for other purposes; ask your health care provider or  pharmacist if you have questions. COMMON BRAND NAME(S): B-12 Compliance Kit, B-12 Injection Kit, Cyomin, LA-12, Nutri-Twelve, Physicians EZ Use B-12, Primabalt What should I tell my health care provider before I take this medicine? They need to know if you have any of these conditions:  kidney disease  Leber's disease  megaloblastic anemia  an unusual or allergic reaction to cyanocobalamin, cobalt, other medicines, foods, dyes, or preservatives  pregnant or trying to get pregnant  breast-feeding How should I use this medicine? This medicine is injected into a muscle or deeply under the skin. It is usually given by a health care professional in a clinic or doctor's office. However, your doctor may teach you how to inject yourself. Follow all instructions. Talk to your pediatrician regarding the use of this medicine in children. Special care may be needed. Overdosage: If you think you have taken too much of this medicine contact a poison control center or emergency room at once. NOTE: This medicine is only for you. Do not share this medicine with others. What if I miss a dose? If you are given your dose at a clinic or doctor's office, call to reschedule your appointment. If you give your own injections and you miss a dose, take it as soon as you can. If it is almost time for your next dose, take only that dose. Do not take double or extra doses. What may interact with this medicine?  colchicine  heavy alcohol intake This list may not describe all possible interactions. Give your health care provider a list of all the medicines, herbs, non-prescription drugs, or dietary supplements you use. Also tell them if you smoke, drink alcohol, or use illegal drugs. Some items may interact with your medicine. What should I watch for while using this medicine? Visit your doctor or health care professional regularly. You may need blood work done while you are taking this medicine. You may need to  follow a special diet. Talk to your doctor. Limit your alcohol intake and avoid smoking to get the best benefit. What side effects may I notice from receiving this medicine? Side effects that you should report to your doctor or health care professional as soon as possible:  allergic reactions like skin rash, itching or hives, swelling of the face, lips, or tongue  blue tint to skin  chest tightness, pain  difficulty breathing, wheezing  dizziness  red, swollen painful area on the leg Side effects that usually do not require medical attention (report to your doctor or health care professional if they continue or are bothersome):  diarrhea  headache This list may not describe all possible side effects. Call your doctor for medical advice about side effects. You may report side effects to FDA at 1-800-FDA-1088. Where should I keep my medicine? Keep out of the reach of children. Store at room temperature between 15 and 30 degrees C (59 and 85 degrees F). Protect from light. Throw away any unused medicine after the expiration date. NOTE: This sheet is a summary. It may not cover all possible information. If you have questions about this medicine, talk to your doctor, pharmacist, or health care provider.  2020 Elsevier/Gold   Elsevier/Gold Standard (2008-01-09 22:10:20)

## 2020-10-10 ENCOUNTER — Encounter: Payer: Self-pay | Admitting: Surgery

## 2020-10-10 ENCOUNTER — Ambulatory Visit: Payer: 59 | Admitting: Surgery

## 2020-10-10 VITALS — BP 109/74 | HR 77

## 2020-10-10 DIAGNOSIS — M25561 Pain in right knee: Secondary | ICD-10-CM

## 2020-10-10 DIAGNOSIS — G8929 Other chronic pain: Secondary | ICD-10-CM | POA: Diagnosis not present

## 2020-10-10 MED ORDER — BUPIVACAINE HCL 0.25 % IJ SOLN
6.0000 mL | INTRAMUSCULAR | Status: AC | PRN
  Administered 2020-10-10: 6 mL via INTRA_ARTICULAR

## 2020-10-10 MED ORDER — LIDOCAINE HCL 1 % IJ SOLN
3.0000 mL | INTRAMUSCULAR | Status: AC | PRN
  Administered 2020-10-10: 3 mL

## 2020-10-10 NOTE — Progress Notes (Signed)
Office Visit Note   Patient: Meredith Byrd           Date of Birth: 11-Jul-1981           MRN: [id number] Visit Date: 10/10/2020              Requested by: Irena Reichmann, DO [address] [place],  [place] [address] PCP: Irena Reichmann, DO   Assessment & Plan: Visit Diagnoses:  1. Mechanical pain of right knee   2. Chronic pain of right knee     Plan: After patient consent right knee was prepped with Betadine and intra-articular Marcaine/betamethasone injection performed.  We will have patient follow-up with Dr. Ophelia Charter in 3 weeks for recheck.  He can decide at that time as to whether or not he wants to proceed with outpatient right knee arthroscopy debridement/chondroplasty.  Put in a new order for physical therapy and patient will go for this a few weeks and they will also give her an appropriate home exercise program.  Follow-Up Instructions: Return in about 3 weeks (around 10/31/2020) for with dr yates recheck right knee and discuss further treatment.   Orders:  No orders of the defined types were placed in this encounter.  No orders of the defined types were placed in this encounter.     Procedures: Large Joint Inj on 10/10/2020 4:25 PM Indications: pain Details: 25 G 1.5 in needle, anteromedial approach Medications: 3 mL lidocaine 1 %; 6 mL bupivacaine 0.25 % Outcome: tolerated well, no immediate complications  After patient consent intra-articular Marcaine/betamethasone injection performed. Consent was given by the patient. Patient was prepped and draped in the usual sterile fashion.       Clinical Data: No additional findings.   Subjective: Chief Complaint  Patient presents with  . Right Knee - Pain  . Left Knee - Pain    HPI 39 year old black female returns with complaints of right knee pain and feeling of mechanical symptoms.  Last office visit with Dr. Ophelia Charter he scheduled her to come back to see me for right knee intra-articular  Marcaine/Depo-Medrol injection.  Patient states that she did not go to formal PT yet.  Knee continues to be bothered with activity. Review of Systems No current cardiac pulmonary GI GU  Objective: Vital Signs: BP 109/74   Pulse 77   Physical Exam HENT:     Head: Normocephalic.  Pulmonary:     Effort: No respiratory distress.  Musculoskeletal:     Comments: Gait antalgic.  Good knee range of motion.  Medial and lateral joint line tenderness.  Neurological:     Mental Status: She is alert and oriented to person, place, and time.     Ortho Exam  Specialty Comments:  No specialty comments available.  Imaging: No results found.   PMFS History: Patient Active Problem List   Diagnosis Date Noted  . 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 On Depo-Provera for contraception 05/31/2019  . Chronic insomnia 05/17/2019  . Lumbar facet arthropathy 12/14/2018  . Dysmenorrhea, unspecified 05/16/2018  . Knee pain, left 11/17/2017  . Generalized anxiety disorder 10/04/2017  . Morning headache 05/04/2017  . Daytime somnolence 05/04/2017  . Class 3 obesity with body mass index (BMI) of 45.0 to 49.9 in adult 03/28/2017  . Chronic low back pain 01/19/2017  . Vitamin D deficiency 01/18/2017  . Vitamin B12 deficiency 03/17/2016  . Beta thalassemia trait 03/25/2015  . Intractable migraine without aura and without status migrainosus 11/23/2014  .  Elevated sed rate 08/20/2014  . Arthritis pain 08/20/2014  . Iron deficiency anemia 07/09/2014  . Symptomatic anemia 06/05/2014  . Chest pain 05/01/2014  . GERD (gastroesophageal reflux disease) 03/22/2014  . S/P gastric bypass 03/22/2014  . Insomnia 03/22/2014  . Migraine 03/22/2014  . Palpitations 03/22/2014   Past Medical History:  Diagnosis Date  . Allergy   . Anemia   . Anxiety   . Arthritis pain 08/20/2014  . Back pain   . COVID-19   . GERD (gastroesophageal reflux disease)   . History of stress test 02/2011 ([facility])    there was no evidence of ischemia, but she only went 3 1/2 minutes on the treadmill making it very difficult to get a good accurate assessment, however  . Hx of echocardiogram    The echocadiogram was essentially normal with the exception of mild mitral calcification and borderline concentric LVH, which in the setting of her hypertension at this early age is something that mean her blood pressure is well controlled.   . Hypertension   . Iron deficiency anemia, unspecified 07/09/2014  . Migraines   . Palpitations   . Swelling   . Vitamin B12 deficiency 03/17/2016    Family History  Problem Relation Age of Onset  . Stroke Maternal Grandmother   . Heart disease Maternal Grandmother   . Hypertension Maternal Grandmother   . Hyperlipidemia Maternal Grandmother   . Breast cancer Maternal Grandmother   . Asthma Mother   . Heart disease Mother   . Diabetes Mother   . Early death Mother   . Hypertension Mother   . Thyroid disease Mother   . Anxiety disorder Mother   . Hypertension Father   . Heart disease Father   . Breast cancer Maternal Aunt        under 65  . Breast cancer Paternal Aunt     Past Surgical History:  Procedure Laterality Date  . BREAST SURGERY    . CHOLECYSTECTOMY    . GASTRIC BYPASS  2008  . HIGH RISK BREAST EXCISION Bilateral   . OOPHORECTOMY Right 2010   Social History   Occupational History  . Occupation: Human resources officer  Tobacco Use  . Smoking status: Never Smoker  . Smokeless tobacco: Never Used  Vaping Use  . Vaping Use: Never used  Substance and Sexual Activity  . Alcohol use: No  . Drug use: No  . Sexual activity: Yes

## 2020-10-28 ENCOUNTER — Ambulatory Visit: Payer: 59 | Admitting: Physical Therapy

## 2020-10-31 ENCOUNTER — Other Ambulatory Visit: Payer: Self-pay

## 2020-10-31 ENCOUNTER — Encounter: Payer: Self-pay | Admitting: Physical Therapy

## 2020-10-31 ENCOUNTER — Ambulatory Visit (INDEPENDENT_AMBULATORY_CARE_PROVIDER_SITE_OTHER): Payer: 59 | Admitting: Physical Therapy

## 2020-10-31 DIAGNOSIS — M25562 Pain in left knee: Secondary | ICD-10-CM

## 2020-10-31 DIAGNOSIS — M25662 Stiffness of left knee, not elsewhere classified: Secondary | ICD-10-CM | POA: Diagnosis not present

## 2020-10-31 DIAGNOSIS — R2689 Other abnormalities of gait and mobility: Secondary | ICD-10-CM

## 2020-10-31 DIAGNOSIS — G8929 Other chronic pain: Secondary | ICD-10-CM

## 2020-10-31 DIAGNOSIS — M25661 Stiffness of right knee, not elsewhere classified: Secondary | ICD-10-CM

## 2020-10-31 DIAGNOSIS — M25561 Pain in right knee: Secondary | ICD-10-CM | POA: Diagnosis not present

## 2020-10-31 DIAGNOSIS — R2681 Unsteadiness on feet: Secondary | ICD-10-CM

## 2020-10-31 DIAGNOSIS — M6281 Muscle weakness (generalized): Secondary | ICD-10-CM

## 2020-10-31 NOTE — Patient Instructions (Signed)
Access Code: [email or web address] URL: [email or web address] Date: 10/31/2020 Prepared by: Vladimir Faster  Exercises Sidelying Hip Abduction - 1 x daily - 7 x weekly - 2-3 sets - 10 reps - 5 seconds hold Seated straight leg lifts - 1 x daily - 7 x weekly - 2-3 sets - 10 reps - 5 seconds hold Seated Long Arc Quad - 1 x daily - 7 x weekly - 2-3 sets - 10 reps - 5 seconds hold Seated Ankle Plantarflexion with Resistance - 1 x daily - 7 x weekly - 2-3 sets - 10 reps - 5 seconds hold Seated Hamstring Stretch - 2 x daily - 7 x weekly - 1 sets - 2-3 reps - 20-30 seconds hold Seated Gastroc Stretch with Strap - 2 x daily - 7 x weekly - 1 sets - 2-3 reps - 20-30 seconds hold Standing Knee Flexion with Counter Support - 1 x daily - 7 x weekly - 2 sets - 10 reps - 5 seconds hold Standing Hip Extension with Anchored Resistance - 1 x daily - 7 x weekly - 2 sets - 10 reps - 5 seconds hold

## 2020-10-31 NOTE — Therapy (Signed)
[facility] Physical Therapy [address] [place], [place], [address] Phone: [phone]   Fax:  [phone]  Physical Therapy Evaluation  Patient Details  Name: Meredith Byrd MRN: [id number] Date of Birth: 01-05-1981 Referring Provider (PT): Benjiman Core, [place]   Encounter Date: 10/31/2020   PT End of Session - 10/31/20 1234    Visit Number 1    Number of Visits 4    Date for PT Re-Evaluation 11/21/20    Authorization Type [employer]    Authorization Time Period 10% coinsurance Deductable not met & OOP not met    PT Start Time 0845    PT Stop Time 0930    PT Time Calculation (min) 45 min    Activity Tolerance Patient tolerated treatment well;Patient limited by pain    Behavior During Therapy [facility] for tasks assessed/performed           Past Medical History:  Diagnosis Date  . Allergy   . Anemia   . Anxiety   . Arthritis pain 08/20/2014  . Back pain   . COVID-19   . GERD (gastroesophageal reflux disease)   . History of stress test 02/2011 ([facility])   there was no evidence of ischemia, but she only went 3 1/2 minutes on the treadmill making it very difficult to get a good accurate assessment, however  . Hx of echocardiogram    The echocadiogram was essentially normal with the exception of mild mitral calcification and borderline concentric LVH, which in the setting of her hypertension at this early age is something that mean her blood pressure is well controlled.   . Hypertension   . Iron deficiency anemia, unspecified 07/09/2014  . Migraines   . Palpitations   . Swelling   . Vitamin B12 deficiency 03/17/2016    Past Surgical History:  Procedure Laterality Date  . BREAST SURGERY    . CHOLECYSTECTOMY    . GASTRIC BYPASS  2008  . HIGH RISK BREAST EXCISION Bilateral   . OOPHORECTOMY Right 2010    There were no vitals filed for this visit.    Subjective Assessment - 10/31/20 0839    Subjective This 40yo female was referred to PT by Benjiman Core,  PA with M25.561 (ICD-10-CM) - Mechanical pain of right knee & M25.561,G89.29 (ICD-10-CM) - Chronic pain of right knee on 10/10/2020.  She had right knee intra-articular Marcaine/Depo-Medrol injection on 10/10/2020 which helps for 1-2 weeks to dull pain. She has had right knee pain since 2015.    Pertinent History Her weight was ~300# and had gastric bypass. Currently 159# for last 1.5 years.    Patient Stated Goals to improve knee pain or get to knee replacement.    Currently in Pain? Yes    Pain Score 7    in last week, worst 8-9/10, best 5/10   Pain Location Knee    Pain Orientation Right;Medial    Pain Descriptors / Indicators Sharp;Aching;Dull    Pain Type Chronic pain    Pain Onset More than a month ago    Pain Frequency Constant    Aggravating Factors  positioning too long, standing or walking too long    Pain Relieving Factors rest,    Multiple Pain Sites Yes    Pain Score 6   in last week, worst 9/10, best 5/10   Pain Location Knee    Pain Orientation Left;Medial;Posterior    Pain Descriptors / Indicators Sharp;Aching;Dull    Pain Type Chronic pain    Pain Onset More than  a month ago    Pain Frequency Constant    Aggravating Factors  positioning too long, arthritis    Pain Relieving Factors rest              [facility] PT Assessment - 10/31/20 0840      Assessment   Medical Diagnosis Knee pain    Referring Provider (PT) Benjiman Core, PA    Onset Date/Surgical Date 10/10/20   referral to PT & last injection, pain since 2015   Prior Therapy no PT for knee or back      Precautions   Precautions None      Balance Screen   Has the patient fallen in the past 6 months No    Has the patient had a decrease in activity level because of a fear of falling?  No    Is the patient reluctant to leave their home because of a fear of falling?  No      Home Social worker Private residence    Living Arrangements Spouse/significant other;Children   10-20yo 4 children    Type of [place] to enter    Entrance Stairs-Number of Steps 1    Entrance Stairs-Rails None    Home Layout Two level;1/2 bath on main level    Alternate Level Stairs-Number of Steps 20    Alternate Level Stairs-Rails Right    Home Equipment None      Prior Function   Level of Independence Independent    Vocation Full time employment    Vocation Requirements work from home is sit down      Observation/Other Assessments   Focus on Therapeutic Outcomes (FOTO)  51.2771 functional   potential discharge 64% functional     Functional Tests   Functional tests Single leg stance      Single Leg Stance   Comments LLE 5 sec RLE 2 sec      ROM / Strength   AROM / PROM / Strength AROM;PROM;Strength      AROM   AROM Assessment Site Knee    Right/Left Knee Right;Left    Right Knee Extension -14   seated LAQ   Right Knee Flexion 52   standing with UE support   Left Knee Extension -8   seated LAQ   Left Knee Flexion 63   standing with UE support     PROM   PROM Assessment Site Knee    Right/Left Knee Right;Left    Right Knee Extension -4   supine   Right Knee Flexion 93   seated   Left Knee Extension -1   supine   Left Knee Flexion 115   seated     Strength   Strength Assessment Site Hip;Knee    Right Hip Flexion 3-/5    Right Hip Extension 2/5    Right Hip ABduction 3-/5    Left Hip Flexion 3-/5    Left Hip Extension 2+/5    Left Hip ABduction 3-/5    Right/Left Knee Right;Left    Right Knee Flexion 3-/5    Right Knee Extension 3+/5    Left Knee Flexion 3-/5    Left Knee Extension 3+/5                      Objective measurements completed on examination: See above findings.               PT Education - 10/31/20  [place]    Education Details HEP Medbridge Access Code: [email or web address]  able to breakup during day but need to try all exercises so targeted muscle groups    Person(s) Educated Patient    Methods  Explanation;Demonstration;Tactile cues;Verbal cues;Handout    Comprehension Verbalized understanding;Returned demonstration;Verbal cues required;Tactile cues required;Need further instruction               PT Long Term Goals - 10/31/20 1251      PT LONG TERM GOAL #1   Title Patient demonstrates understanding of ongoing HEP.    Time 3    Period Weeks    Status New    Target Date 11/21/20      PT LONG TERM GOAL #2   Title Patient reports 25% improvement in pain.    Time 3    Period Weeks    Status New    Target Date 11/21/20      PT LONG TERM GOAL #3   Title FOTO score >/= 60%    Time 3    Period Weeks    Status New    Target Date 11/21/20                  Plan - 10/31/20 1236    Clinical Impression Statement This 40yo female has history of chronic arthritic pain in both knees.  She reports that she was morbidly obese until gastric bypass surgery in 2015.  She has pain limiting her function.  She has decreased PROM and AROM with painful end feel.  PT plan to set up exercise program to see how she responds prior to considering knee replacement surgery.    Examination-Participation Restrictions Community Activity;Occupation    Stability/Clinical Decision Making Stable/Uncomplicated    Clinical Decision Making Low    Rehab Potential Good    PT Frequency 1x / week    PT Duration 3 weeks    PT Treatment/Interventions ADLs/Self Care Home Management;Cryotherapy;DME Instruction;Electrical Stimulation;Gait training;Stair training;Functional mobility training;Therapeutic activities;Therapeutic exercise;Balance training;Neuromuscular re-education;Patient/family education;Manual techniques;Passive range of motion;Joint Manipulations    PT Next Visit Plan review & update HEP, add balance exercises to HEP,  instruct in pain management techniques,    PT Home Exercise Plan Access Code: [email or web address]    Consulted and Agree with Plan of Care Patient           Patient will benefit  from skilled therapeutic intervention in order to improve the following deficits and impairments:  Abnormal gait,Cardiopulmonary status limiting activity,Decreased activity tolerance,Decreased balance,Decreased endurance,Decreased mobility,Decreased range of motion,Decreased strength,Difficulty walking,Increased edema,Impaired flexibility,Pain  Visit Diagnosis: Chronic pain of right knee  Chronic pain of left knee  Stiffness of left knee, not elsewhere classified  Stiffness of right knee, not elsewhere classified  Muscle weakness (generalized)  Unsteadiness on feet  Other abnormalities of gait and mobility     Problem List Patient Active Problem List   Diagnosis Date Noted  . 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 On Depo-Provera for contraception 05/31/2019  . Chronic insomnia 05/17/2019  . Lumbar facet arthropathy 12/14/2018  . Dysmenorrhea, unspecified 05/16/2018  . Knee pain, left 11/17/2017  . Generalized anxiety disorder 10/04/2017  . Morning headache 05/04/2017  . Daytime somnolence 05/04/2017  . Class 3 obesity with body mass index (BMI) of 45.0 to 49.9 in adult 03/28/2017  . Chronic low back pain 01/19/2017  . Vitamin D deficiency 01/18/2017  . Vitamin B12 deficiency 03/17/2016  . Beta thalassemia trait 03/25/2015  . Intractable migraine without aura and without  status migrainosus 11/23/2014  . Elevated sed rate 08/20/2014  . Arthritis pain 08/20/2014  . Iron deficiency anemia 07/09/2014  . Symptomatic anemia 06/05/2014  . Chest pain 05/01/2014  . GERD (gastroesophageal reflux disease) 03/22/2014  . S/P gastric bypass 03/22/2014  . Insomnia 03/22/2014  . Migraine 03/22/2014  . Palpitations 03/22/2014    Jamey Reas, PT, DPT 10/31/2020, 12:57 PM  [facility] Physical Therapy [address] [place], [place], [address] Phone: [phone]   Fax:  [phone]  Name: Meredith Byrd MRN: [id number] Date of Birth:  Aug 18, 1981

## 2020-11-07 ENCOUNTER — Ambulatory Visit (INDEPENDENT_AMBULATORY_CARE_PROVIDER_SITE_OTHER): Payer: 59 | Admitting: Rehabilitative and Restorative Service Providers"

## 2020-11-07 ENCOUNTER — Encounter: Payer: Self-pay | Admitting: Rehabilitative and Restorative Service Providers"

## 2020-11-07 ENCOUNTER — Other Ambulatory Visit: Payer: Self-pay

## 2020-11-07 DIAGNOSIS — M6281 Muscle weakness (generalized): Secondary | ICD-10-CM | POA: Diagnosis not present

## 2020-11-07 DIAGNOSIS — R600 Localized edema: Secondary | ICD-10-CM | POA: Diagnosis not present

## 2020-11-07 DIAGNOSIS — G8929 Other chronic pain: Secondary | ICD-10-CM

## 2020-11-07 DIAGNOSIS — M25661 Stiffness of right knee, not elsewhere classified: Secondary | ICD-10-CM

## 2020-11-07 DIAGNOSIS — R262 Difficulty in walking, not elsewhere classified: Secondary | ICD-10-CM

## 2020-11-07 DIAGNOSIS — M25561 Pain in right knee: Secondary | ICD-10-CM

## 2020-11-07 NOTE — Therapy (Signed)
[facility] Physical Therapy [address] [place], [place], [address] Phone: [phone]   Fax:  [phone]  Physical Therapy Treatment  Patient Details  Name: Meredith Byrd MRN: [id number] Date of Birth: 03-09-1981 Referring Provider (PT): Benjiman Core, [place]   Encounter Date: 11/07/2020   PT End of Session - 11/07/20 0854    Visit Number 2    Number of Visits 4    Date for PT Re-Evaluation 11/21/20    Authorization Type [employer]    Authorization Time Period 10% coinsurance Deductable not met & OOP not met    PT Start Time 0802    PT Stop Time 0845    PT Time Calculation (min) 43 min    Activity Tolerance Patient tolerated treatment well;Patient limited by fatigue    Behavior During Therapy [facility] for tasks assessed/performed           Past Medical History:  Diagnosis Date  . Allergy   . Anemia   . Anxiety   . Arthritis pain 08/20/2014  . Back pain   . COVID-19   . GERD (gastroesophageal reflux disease)   . History of stress test 02/2011 ([facility])   there was no evidence of ischemia, but she only went 3 1/2 minutes on the treadmill making it very difficult to get a good accurate assessment, however  . Hx of echocardiogram    The echocadiogram was essentially normal with the exception of mild mitral calcification and borderline concentric LVH, which in the setting of her hypertension at this early age is something that mean her blood pressure is well controlled.   . Hypertension   . Iron deficiency anemia, unspecified 07/09/2014  . Migraines   . Palpitations   . Swelling   . Vitamin B12 deficiency 03/17/2016    Past Surgical History:  Procedure Laterality Date  . BREAST SURGERY    . CHOLECYSTECTOMY    . GASTRIC BYPASS  2008  . HIGH RISK BREAST EXCISION Bilateral   . OOPHORECTOMY Right 2010    There were no vitals filed for this visit.   Subjective Assessment - 11/07/20 0849    Subjective Meredith Byrd reported some difficulty with some of her  early HEP.    Pertinent History Her weight was ~300# and had gastric bypass. Currently 159# for last 1.5 years.    Limitations Sitting;House hold activities;Lifting;Standing;Walking    Patient Stated Goals To improve knee pain or get to knee arthroscopy    Currently in Pain? Yes    Pain Score 6     Pain Location Knee    Pain Orientation Right;Medial    Pain Descriptors / Indicators Aching;Sharp;Dull    Pain Type Chronic pain    Pain Onset More than a month ago    Pain Frequency Constant    Aggravating Factors  Prolonged postures or WB for too long    Pain Relieving Factors Rest, ice, tylenol (limited effectiveness with tylenol)    Effect of Pain on Daily Activities Unable to exercise as she would like (treadmill walking)    Multiple Pain Sites No    Pain Onset More than a month ago                             [facility] Adult PT Treatment/Exercise - 11/07/20 0001      Exercises   Exercises Knee/Hip      Knee/Hip Exercises: Aerobic   Recumbent Bike Seat 2 for 8 minutes Level 4  Knee/Hip Exercises: Machines for Strengthening   Cybex Knee Extension 2 sets of 10 slow eccentrics @ 15# 90 degrees to 30 degrees      Knee/Hip Exercises: Standing   Other Standing Knee Exercises Hip hike 2 sets of 10 for 3 seconds      Knee/Hip Exercises: Seated   Long Arc Quad Strengthening;Both;2 sets;5 reps;Other (comment)    Long Arc Quad Limitations Seated straight leg raises    Sit to [employer] 10 reps;without UE support   slow eccentrics                 PT Education - 11/07/20 3419    Education Details Reviewed HEP and made modifications to focus on quadriceps strengthening while reducing joint pain.  Emphasis on stationary bike and treadmill WALKING (NOT running) for exercise outside HEP.  Also called attention to avoiding knee hyperextension (locking knees out) to keep quadriceps engaged and reduce joint compressive forces.    Person(s) Educated Patient    Methods  Explanation;Demonstration;Tactile cues;Verbal cues;Handout    Comprehension Verbal cues required;Returned demonstration;Need further instruction;Tactile cues required;Verbalized understanding               PT Long Term Goals - 11/07/20 0854      PT LONG TERM GOAL #1   Title Patient demonstrates understanding of ongoing HEP.    Time 3    Period Weeks    Status New      PT LONG TERM GOAL #2   Title Patient reports 25% improvement in pain.    Time 3    Period Weeks    Status On-going      PT LONG TERM GOAL #3   Title FOTO score >/= 60%    Time 3    Period Weeks    Status On-going                 Plan - 11/07/20 0855    Clinical Impression Statement Meredith Byrd has chronic knee pain due to morbid obesity earlier in her life.  Her weight is much more under control but the wear and tear on her knees is affecting her everyday function.  Quadriceps strengthening will be the focus of her HEP along with avoiding locking out her knees when standing.  She will consider knee arthroscopy or other options after 4 weeks of supervised physical therapy.    Personal Factors and Comorbidities Comorbidity 1    Comorbidities Wear and tear due to weight > 300 pounds earlier in her life    Examination-Activity Limitations Squat;Lift;Bend;Locomotion Level;Stairs;Stand    Examination-Participation Restrictions Community Activity;Occupation    Stability/Clinical Decision Making Stable/Uncomplicated    Rehab Potential Good    PT Frequency 1x / week    PT Duration 3 weeks    PT Treatment/Interventions ADLs/Self Care Home Management;Cryotherapy;DME Instruction;Electrical Stimulation;Gait training;Stair training;Functional mobility training;Therapeutic activities;Therapeutic exercise;Balance training;Neuromuscular re-education;Patient/family education;Manual techniques;Passive range of motion;Joint Manipulations    PT Next Visit Plan Quadriceps strengthening    PT Home Exercise Plan Access Code:  [email or web address]    Consulted and Agree with Plan of Care Patient           Patient will benefit from skilled therapeutic intervention in order to improve the following deficits and impairments:  Abnormal gait,Cardiopulmonary status limiting activity,Decreased activity tolerance,Decreased balance,Decreased endurance,Decreased mobility,Decreased range of motion,Decreased strength,Difficulty walking,Increased edema,Impaired flexibility,Pain  Visit Diagnosis: Difficulty walking  Muscle weakness (generalized)  Localized edema  Stiffness of right knee, not elsewhere classified  Chronic pain of right knee  Problem List Patient Active Problem List   Diagnosis Date Noted  . 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 On Depo-Provera for contraception 05/31/2019  . Chronic insomnia 05/17/2019  . Lumbar facet arthropathy 12/14/2018  . Dysmenorrhea, unspecified 05/16/2018  . Knee pain, left 11/17/2017  . Generalized anxiety disorder 10/04/2017  . Morning headache 05/04/2017  . Daytime somnolence 05/04/2017  . Class 3 obesity with body mass index (BMI) of 45.0 to 49.9 in adult 03/28/2017  . Chronic low back pain 01/19/2017  . Vitamin D deficiency 01/18/2017  . Vitamin B12 deficiency 03/17/2016  . Beta thalassemia trait 03/25/2015  . Intractable migraine without aura and without status migrainosus 11/23/2014  . Elevated sed rate 08/20/2014  . Arthritis pain 08/20/2014  . Iron deficiency anemia 07/09/2014  . Symptomatic anemia 06/05/2014  . Chest pain 05/01/2014  . GERD (gastroesophageal reflux disease) 03/22/2014  . S/P gastric bypass 03/22/2014  . Insomnia 03/22/2014  . Migraine 03/22/2014  . Palpitations 03/22/2014    Farley Ly PT, MPT 11/07/2020, 8:59 AM  [facility] Physical Therapy [address] [place], [place], [address] Phone: [phone]   Fax:  [phone]  Name: Meredith Byrd MRN: [id number] Date of Birth: December 23, 1980

## 2020-11-08 ENCOUNTER — Encounter: Payer: Self-pay | Admitting: Orthopaedic Surgery

## 2020-11-08 ENCOUNTER — Ambulatory Visit: Payer: 59 | Admitting: Orthopaedic Surgery

## 2020-11-08 VITALS — Ht <= 58 in | Wt 160.0 lb

## 2020-11-08 DIAGNOSIS — M1710 Unilateral primary osteoarthritis, unspecified knee: Secondary | ICD-10-CM | POA: Diagnosis not present

## 2020-11-08 DIAGNOSIS — M2241 Chondromalacia patellae, right knee: Secondary | ICD-10-CM | POA: Diagnosis not present

## 2020-11-08 MED ORDER — DICLOFENAC SODIUM 1 % EX GEL: 4.0000 g | Freq: Four times a day (QID) | CUTANEOUS | Status: DC

## 2020-11-08 NOTE — Progress Notes (Signed)
Office Visit Note   Patient: Meredith Byrd           Date of Birth: May 02, 1981           MRN: [id number] Visit Date: 11/08/2020              Requested by: Irena Reichmann, DO [address] [place],  [place] [address] PCP: Irena Reichmann, DO   Assessment & Plan: Visit Diagnoses:  1. Arthritis of knee   2. Chondromalacia patellae, right knee     Plan: We reviewed her plain x-rays which show showed she is maintained joint space and only has a tiny osteophyte.  MRI scan shows some chondromalacia and she needs to continue with the therapy and strengthening.  She cannot take anti-inflammatories due to her previous gastric surgery.  We discussed topical cream riding exercise bike.  She was heavy for an extended period time before her gastric procedure and we reviewed the MRI scan and discussed periods of chondromalacia.  I discussed her I do not think she would likely get satisfactory relief with the knee arthroscopy we discussed variable results with arthroscopic treatment when knee arthritis is the problem and no locking or catching is present.  She can follow-up on an as-needed basis.  Follow-Up Instructions: Return in about 3 months (around 02/06/2021).   Orders:  No orders of the defined types were placed in this encounter.  Meds ordered this encounter  Medications  . diclofenac Sodium (VOLTAREN) 1 % topical gel 4 g      Procedures: No procedures performed   Clinical Data: No additional findings.   Subjective: Chief Complaint  Patient presents with  . Right Knee - Follow-up    HPI 40 year old female post bypass surgery with significant weight loss returns with ongoing problems with right knee pain.  Injection 10/10/2020 helped for 5 days.  She is requesting a handicap placard due to pain in her knee and MRI scan is available for review.  This shows fraying of the posterior meniscus and areas of partial-thickness wear in the medial compartment as well as  some areas of full-thickness cartilage weight loss of the medial femoral condyle.  Partial thickness laterally.  Patellofemoral joint looked better.  Patient states her right knee stays swollen and painful.  Review of Systems 14 review of systems updated unchanged from 10/10/2020 office visit.   Objective: Vital Signs: Ht 4\' 10"  (1.473 m)   Wt 160 lb (72.6 kg)   BMI 33.44 kg/m   Physical Exam Constitutional:      Appearance: She is well-developed.  HENT:     Head: Normocephalic.     Right Ear: External ear normal.     Left Ear: External ear normal.  Eyes:     Pupils: Pupils are equal, round, and reactive to light.  Neck:     Thyroid: No thyromegaly.     Trachea: No tracheal deviation.  Cardiovascular:     Rate and Rhythm: Normal rate.  Pulmonary:     Effort: Pulmonary effort is normal.  Abdominal:     Palpations: Abdomen is soft.  Skin:    General: Skin is warm and dry.  Neurological:     Mental Status: She is alert and oriented to person, place, and time.  Psychiatric:        Mood and Affect: Mood and affect normal.        Behavior: Behavior normal.     Ortho Exam patient has trace effusion right knee.  No significant crepitus with knee flexion and extension with loading patellofemoral joint.  She has some medial and lateral joint line tenderness.  Specialty Comments:  No specialty comments available.  Imaging: CLINICAL DATA:  Right knee pain and mechanical symptoms, failed conservative therapy.  EXAM: MRI OF THE RIGHT KNEE WITHOUT CONTRAST  TECHNIQUE: Multiplanar, multisequence MR imaging of the knee was performed. No intravenous contrast was administered.  COMPARISON:  None.  FINDINGS: MENISCI  Medial: Fraying of the free edge of the posterior horn of the medial meniscus. Diminutive size of the body of the medial meniscus likely reflecting a degenerative tear.  Lateral: Intact.  LIGAMENTS  Cruciates: ACL and PCL are  intact.  Collaterals: Medial collateral ligament is intact. Lateral collateral ligament complex is intact.  CARTILAGE  Patellofemoral:  No chondral defect.  Medial: Partial-thickness cartilage loss of the medial femorotibial compartment with areas of full-thickness cartilage loss involving the weight-bearing surface of the medial femoral condyle.  Lateral: Mild partial-thickness cartilage loss of the lateral femorotibial compartment.  JOINT: No joint effusion. Normal Hoffa's fat-pad. No plical thickening.  POPLITEAL FOSSA: Popliteus tendon is intact. No Baker's cyst.  EXTENSOR MECHANISM: Intact quadriceps tendon. Intact patellar tendon. Intact lateral patellar retinaculum. Intact medial patellar retinaculum. Intact MPFL.  BONES: No aggressive osseous lesion. No fracture or dislocation.  Other: No fluid collection or hematoma. Muscles are normal.  IMPRESSION: 1. Fraying of the free edge of the posterior horn of the medial meniscus. Diminutive size of the body of the medial meniscus likely reflecting a degenerative tear. 2. Partial-thickness cartilage loss of the medial femorotibial compartment with areas of full-thickness cartilage loss involving the weight-bearing surface of the medial femoral condyle. 3. Mild partial-thickness cartilage loss of the lateral femorotibial compartment.   Electronically Signed   By: Elige Ko   On: 06/30/2020 10:02    PMFS History: Patient Active Problem List   Diagnosis Date Noted  . 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 On Depo-Provera for contraception 05/31/2019  . Chronic insomnia 05/17/2019  . Lumbar facet arthropathy 12/14/2018  . Dysmenorrhea, unspecified 05/16/2018  . Knee pain, left 11/17/2017  . Generalized anxiety disorder 10/04/2017  . Morning headache 05/04/2017  . Daytime somnolence 05/04/2017  . Class 3 obesity with body mass index (BMI) of 45.0 to 49.9 in adult 03/28/2017  . Chronic  low back pain 01/19/2017  . Vitamin D deficiency 01/18/2017  . Vitamin B12 deficiency 03/17/2016  . Beta thalassemia trait 03/25/2015  . Intractable migraine without aura and without status migrainosus 11/23/2014  . Elevated sed rate 08/20/2014  . Arthritis pain 08/20/2014  . Iron deficiency anemia 07/09/2014  . Symptomatic anemia 06/05/2014  . Chest pain 05/01/2014  . GERD (gastroesophageal reflux disease) 03/22/2014  . S/P gastric bypass 03/22/2014  . Insomnia 03/22/2014  . Migraine 03/22/2014  . Palpitations 03/22/2014   Past Medical History:  Diagnosis Date  . Allergy   . Anemia   . Anxiety   . Arthritis pain 08/20/2014  . Back pain   . COVID-19   . GERD (gastroesophageal reflux disease)   . History of stress test 02/2011 ([facility])   there was no evidence of ischemia, but she only went 3 1/2 minutes on the treadmill making it very difficult to get a good accurate assessment, however  . Hx of echocardiogram    The echocadiogram was essentially normal with the exception of mild mitral calcification and borderline concentric LVH, which in the setting of her hypertension at this early age is  something that mean her blood pressure is well controlled.   . Hypertension   . Iron deficiency anemia, unspecified 07/09/2014  . Migraines   . Palpitations   . Swelling   . Vitamin B12 deficiency 03/17/2016    Family History  Problem Relation Age of Onset  . Stroke Maternal Grandmother   . Heart disease Maternal Grandmother   . Hypertension Maternal Grandmother   . Hyperlipidemia Maternal Grandmother   . Breast cancer Maternal Grandmother   . Asthma Mother   . Heart disease Mother   . Diabetes Mother   . Early death Mother   . Hypertension Mother   . Thyroid disease Mother   . Anxiety disorder Mother   . Hypertension Father   . Heart disease Father   . Breast cancer Maternal Aunt        under 65  . Breast cancer Paternal Aunt     Past Surgical History:  Procedure Laterality Date   . BREAST SURGERY    . CHOLECYSTECTOMY    . GASTRIC BYPASS  2008  . HIGH RISK BREAST EXCISION Bilateral   . OOPHORECTOMY Right 2010   Social History   Occupational History  . Occupation: Human resources officer  Tobacco Use  . Smoking status: Never Smoker  . Smokeless tobacco: Never Used  Vaping Use  . Vaping Use: Never used  Substance and Sexual Activity  . Alcohol use: No  . Drug use: No  . Sexual activity: Yes

## 2020-11-14 ENCOUNTER — Encounter: Payer: Self-pay | Admitting: Physical Therapy

## 2020-11-14 ENCOUNTER — Other Ambulatory Visit: Payer: Self-pay

## 2020-11-14 ENCOUNTER — Ambulatory Visit: Payer: 59 | Admitting: Physical Therapy

## 2020-11-14 DIAGNOSIS — R2681 Unsteadiness on feet: Secondary | ICD-10-CM

## 2020-11-14 DIAGNOSIS — G8929 Other chronic pain: Secondary | ICD-10-CM

## 2020-11-14 DIAGNOSIS — M6281 Muscle weakness (generalized): Secondary | ICD-10-CM | POA: Diagnosis not present

## 2020-11-14 DIAGNOSIS — M25661 Stiffness of right knee, not elsewhere classified: Secondary | ICD-10-CM

## 2020-11-14 DIAGNOSIS — M25562 Pain in left knee: Secondary | ICD-10-CM

## 2020-11-14 DIAGNOSIS — M25561 Pain in right knee: Secondary | ICD-10-CM | POA: Diagnosis not present

## 2020-11-14 DIAGNOSIS — R600 Localized edema: Secondary | ICD-10-CM | POA: Diagnosis not present

## 2020-11-14 DIAGNOSIS — R2689 Other abnormalities of gait and mobility: Secondary | ICD-10-CM

## 2020-11-14 DIAGNOSIS — M25662 Stiffness of left knee, not elsewhere classified: Secondary | ICD-10-CM

## 2020-11-14 NOTE — Therapy (Signed)
[facility] [address] [place], [place], [address] Phone: [phone]   Fax:  [phone]  Physical Therapy Treatment  Patient Details  Name: Meredith Byrd MRN: [id number] Date of Birth: [birth year] Referring Provider (PT): Benjiman Core, [place]   Encounter Date: 11/14/2020   PT End of Session - 11/14/20 0757    Visit Number 3    Number of Visits 4    Date for PT Re-Evaluation 11/21/20    Authorization Type [employer]    Authorization Time Period 10% coinsurance Deductable not met & OOP not met    PT Start Time 0800    PT Stop Time 0846    PT Time Calculation (min) 46 min    Activity Tolerance Patient tolerated treatment well;Patient limited by fatigue    Behavior During Therapy [facility] for tasks assessed/performed           Past Medical History:  Diagnosis Date  . Allergy   . Anemia   . Anxiety   . Arthritis pain 08/20/2014  . Back pain   . COVID-19   . GERD (gastroesophageal reflux disease)   . History of stress test 02/2011 ([facility])   there was no evidence of ischemia, but she only went 3 1/2 minutes on the treadmill making it very difficult to get a good accurate assessment, however  . Hx of echocardiogram    The echocadiogram was essentially normal with the exception of mild mitral calcification and borderline concentric LVH, which in the setting of her hypertension at this early age is something that mean her blood pressure is well controlled.   . Hypertension   . Iron deficiency anemia, unspecified 07/09/2014  . Migraines   . Palpitations   . Swelling   . Vitamin B12 deficiency 03/17/2016    Past Surgical History:  Procedure Laterality Date  . BREAST SURGERY    . CHOLECYSTECTOMY    . GASTRIC BYPASS  2008  . HIGH RISK BREAST EXCISION Bilateral   . OOPHORECTOMY Right 2010    There were no vitals filed for this visit.   Subjective Assessment - 11/14/20 0758    Subjective The exercises are going well.    Pertinent History  Her weight was ~300# and had gastric bypass. Currently 159# for last 1.5 years.    Limitations Sitting;House hold activities;Lifting;Standing;Walking    Patient Stated Goals To improve knee pain or get to knee arthroscopy    Currently in Pain? Yes   ranges 4/10 - 10/10   Pain Score 8     Pain Location Knee    Pain Orientation Right;Left    Pain Descriptors / Indicators Sharp;Aching;Stabbing    Pain Type Chronic pain    Pain Onset More than a month ago    Pain Frequency Constant    Aggravating Factors  standing & walking, laying in certain positions, step wrong    Pain Relieving Factors rest, ice, heat, meds    Pain Onset More than a month ago                             [facility] Adult PT Treatment/Exercise - 11/14/20 0001      Knee/Hip Exercises: Aerobic   Recumbent Bike Seat 2 level 1 for 15 minutes  PT demo & verbal cues to educate on proper set up to protect knees. PT educated that goal was to build time first before intensity or speed.  She can do bouts of increased speed  on bike or treadmill (not running or jogging) to work on fast twitch muscles. She verbalized understanding.   PT educated patient on other topics while riding bike.      Knee/Hip Exercises: Prone   Other Prone Exercises Prone plank with underinflated ball at pelvis: hip extension, cervical rotation & flex/ext.  Discussed using phone timer and doing 60sec 3 sets.  PT demo side planks with ball under pelvis with options of arm & leg movements.          PT demo & instructed standing desk top options with shock absorption mat as work accommodation option.  Pt verbalized understanding.  PT instructed in setting up 2 exercise programs to address endurance, flexibility, strength & balance including basic restorative Yoga. Doing one program Mon / Wed / Fri & other Tues / Raynelle Dick / Sat and take Sunday off.  Doing same program 7 days / week which she is doing is not recommended. Pt verbalized understanding.  Pt  requested patient question about meds for pain management with history of gastric bypass. PT recommended investigating non-digestive options like injections & infusion therapy. She will need to discuss with MD &/or her pharmacist.              PT Long Term Goals - 11/07/20 0854      PT LONG TERM GOAL #1   Title Patient demonstrates understanding of ongoing HEP.    Time 3    Period Weeks    Status New      PT LONG TERM GOAL #2   Title Patient reports 25% improvement in pain.    Time 3    Period Weeks    Status On-going      PT LONG TERM GOAL #3   Title FOTO score >/= 60%    Time 3    Period Weeks    Status On-going                 Plan - 11/14/20 0757    Clinical Impression Statement PT session focused on educating patient on well rounded and appropriate exercise program. She verbalizes better understanding. Upon patient request, PT discussed pain medication options with her history of gastric bypass. PT recommended investigating non-digestive system options like injections or infusion therapy then discuss with MD & pharmacist.  PT also discuss work modification options like standing desk top with shock absorption mat under her feet. She needs to explore requirements for acquiring work modifications with her employer.    Personal Factors and Comorbidities Comorbidity 1    Comorbidities Wear and tear due to weight > 300 pounds earlier in her life    Examination-Activity Limitations Squat;Lift;Bend;Locomotion Level;Stairs;Stand    Examination-Participation Restrictions Community Activity;Occupation    Stability/Clinical Decision Making Stable/Uncomplicated    Rehab Potential Good    PT Frequency 1x / week    PT Duration 3 weeks    PT Treatment/Interventions ADLs/Self Care Home Management;Cryotherapy;DME Instruction;Electrical Stimulation;Gait training;Stair training;Functional mobility training;Therapeutic activities;Therapeutic exercise;Balance training;Neuromuscular  re-education;Patient/family education;Manual techniques;Passive range of motion;Joint Manipulations    PT Next Visit Plan check LTGs and adjust her exercise program as indicated.    PT Home Exercise Plan Access Code: [email or web address]    Consulted and Agree with Plan of Care Patient           Patient will benefit from skilled therapeutic intervention in order to improve the following deficits and impairments:  Abnormal gait,Cardiopulmonary status limiting activity,Decreased activity tolerance,Decreased balance,Decreased endurance,Decreased mobility,Decreased range of motion,Decreased strength,Difficulty walking,Increased edema,Impaired  flexibility,Pain  Visit Diagnosis: Muscle weakness (generalized)  Localized edema  Stiffness of right knee, not elsewhere classified  Chronic pain of right knee  Chronic pain of left knee  Stiffness of left knee, not elsewhere classified  Unsteadiness on feet  Other abnormalities of gait and mobility     Problem List Patient Active Problem List   Diagnosis Date Noted  . 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 On Depo-Provera for contraception 05/31/2019  . Chronic insomnia 05/17/2019  . Lumbar facet arthropathy 12/14/2018  . Dysmenorrhea, unspecified 05/16/2018  . Knee pain, left 11/17/2017  . Generalized anxiety disorder 10/04/2017  . Morning headache 05/04/2017  . Daytime somnolence 05/04/2017  . Class 3 obesity with body mass index (BMI) of 45.0 to 49.9 in adult 03/28/2017  . Chronic low back pain 01/19/2017  . Vitamin D deficiency 01/18/2017  . Vitamin B12 deficiency 03/17/2016  . Beta thalassemia trait 03/25/2015  . Intractable migraine without aura and without status migrainosus 11/23/2014  . Elevated sed rate 08/20/2014  . Arthritis pain 08/20/2014  . Iron deficiency anemia 07/09/2014  . Symptomatic anemia 06/05/2014  . Chest pain 05/01/2014  . GERD (gastroesophageal reflux disease) 03/22/2014  . S/P gastric bypass  03/22/2014  . Insomnia 03/22/2014  . Migraine 03/22/2014  . Palpitations 03/22/2014    Jamey Reas, PT, DPT 11/14/2020, 9:23 AM  [facility] [address] [place], [place], [address] Phone: [phone]   Fax:  [phone]  Name: MACLOVIA UHER MRN: [id number] Date of Birth: Apr 05, 1981

## 2020-11-14 NOTE — Progress Notes (Signed)
I have reviewed and agreed above plan. 

## 2020-11-14 NOTE — Patient Instructions (Signed)
Fitness Plan has 4 components.  1. Endurance - Goal is 20-30 minutes. You can break time up between machines. You can do sets with rest between sets if need. Example 5 minutes work, 2 minutes rest for 3 sets. Recommend machines that are sitting with back support that uses both your arms and your legs at the same time. Or can do walking program & can use assistive device if increases time & quality of your walking.  Treadmill safety- step on & off machine with foot on solid portion not treadmill belt. Use safety lead so belt will stop if you get in trouble. Straddle belt. Turn treadmill on & set to desired speed while you are still straddling the belt. Step on & off belt leading with your good leg. Step off the belt first to adjust speed or stop.  2. Strength - Goal is form & control. Weight machines should have enough weight to have resistance but not so much that you have to strain or cheat. If you have to cheat (poor form), strain or the weight stack hits hard / out of control, then you probably have too much weight.   Goal is to do 15 repetitions for 1-3 sets. Start with 1 set and build up. Rest 30-60 seconds between sets.  Do 2-4 leg machines, 2-4 arm machines & 2-4 trunk machines. Build up to 4-6 leg machines & 4-6 arm machines. Look for pictures to make sure you exercise both sides of arm, leg or trunk. You want to balance out muscle groups that you working.   Leg machines like leg press (can do both legs or each leg by themselves),   At home can do theraband exercises for arms or legs, floor transfers, sit to stand to sit using arms as little as possible, Yoga positions 3.Flexibility - make sure you include arms, legs & trunk. Can do Yoga also 4. Balance- can work in corner with chair in front - head turns, arm motions, eyes closed; place one foot inside cabinet or on 4-6" block to work on one-legged stance,   Try to get to each component 3-5 times per week.  Going to [facility] or fitness center you  want do things you can not do at home.  For example use bikes at [facility] if you don't have one at home. Then on days you don't go to [facility], do exercises at home. Having 2 or more programs like one program for [facility] & one program for home / days that you do not go to [facility].   Recommendations are at least 150 minutes a week of moderate intensity exercise. Try not to go more than 2 days in a row without being active for at least 30 minutes a day. Any activity where you are up and moving is good- Walking, bicycling, stationary bicycling, dancing.  (moderate intensity means to get  a little out of breath). To start you may not tolerate moderate but if it is challenging to you then it is helping.    Do resistance exercise at least 2 times a week.  This can be yoga poses or strength training where you lift your own weight (think leg lifts or toe raises)  or light weights like cans of beans with your arms.    Flexibility and balance exercise- safe stretching and practicing balance is very important to health.  1. Build 2 separate work Programmer, systems. Do one workout Mon / Wed / Fri and second workout Tues /  Thurs / Sat with Sundays off for rest. She should not be doing same routine 7 days / week.   2. Investigate standing desk & work modifications provided by her company, [employer]. 3. Investigate options of injection or infusion for pain management for osteoarthritis then check with MD or pharmacist if okay with history of gastric bypass surgery.

## 2020-11-18 ENCOUNTER — Other Ambulatory Visit: Payer: Self-pay

## 2020-11-18 ENCOUNTER — Inpatient Hospital Stay: Payer: 59

## 2020-11-18 ENCOUNTER — Other Ambulatory Visit: Payer: Self-pay | Admitting: Hematology and Oncology

## 2020-11-18 ENCOUNTER — Inpatient Hospital Stay: Payer: 59 | Attending: Hematology and Oncology | Admitting: Hematology and Oncology

## 2020-11-18 VITALS — BP 105/69 | HR 69 | Temp 98.1°F | Resp 16 | Ht <= 58 in | Wt 167.5 lb

## 2020-11-18 DIAGNOSIS — D508 Other iron deficiency anemias: Secondary | ICD-10-CM

## 2020-11-18 DIAGNOSIS — D563 Thalassemia minor: Secondary | ICD-10-CM | POA: Diagnosis not present

## 2020-11-18 DIAGNOSIS — Z79899 Other long term (current) drug therapy: Secondary | ICD-10-CM | POA: Diagnosis not present

## 2020-11-18 DIAGNOSIS — D509 Iron deficiency anemia, unspecified: Secondary | ICD-10-CM | POA: Insufficient documentation

## 2020-11-18 DIAGNOSIS — E538 Deficiency of other specified B group vitamins: Secondary | ICD-10-CM | POA: Insufficient documentation

## 2020-11-18 DIAGNOSIS — Z9884 Bariatric surgery status: Secondary | ICD-10-CM | POA: Diagnosis not present

## 2020-11-18 LAB — CBC WITH DIFFERENTIAL (CANCER CENTER ONLY)
Abs Immature Granulocytes: 0.01 10*3/uL (ref 0.00–0.07)
Basophils Absolute: 0 10*3/uL (ref 0.0–0.1)
Basophils Relative: 0 %
Eosinophils Absolute: 0 10*3/uL (ref 0.0–0.5)
Eosinophils Relative: 1 %
HCT: 34.5 % — ABNORMAL LOW (ref 36.0–46.0)
Hemoglobin: 10.6 g/dL — ABNORMAL LOW (ref 12.0–15.0)
Immature Granulocytes: 0 %
Lymphocytes Relative: 33 %
Lymphs Abs: 1.5 10*3/uL (ref 0.7–4.0)
MCH: 24.6 pg — ABNORMAL LOW (ref 26.0–34.0)
MCHC: 30.7 g/dL (ref 30.0–36.0)
MCV: 80 fL (ref 80.0–100.0)
Monocytes Absolute: 0.2 10*3/uL (ref 0.1–1.0)
Monocytes Relative: 5 %
Neutro Abs: 2.8 10*3/uL (ref 1.7–7.7)
Neutrophils Relative %: 61 %
Platelet Count: 417 10*3/uL — ABNORMAL HIGH (ref 150–400)
RBC: 4.31 MIL/uL (ref 3.87–5.11)
RDW: 17.6 % — ABNORMAL HIGH (ref 11.5–15.5)
WBC Count: 4.6 10*3/uL (ref 4.0–10.5)
nRBC: 0 % (ref 0.0–0.2)

## 2020-11-18 LAB — CMP (CANCER CENTER ONLY)
ALT: 22 U/L (ref 0–44)
AST: 21 U/L (ref 15–41)
Albumin: 3.9 g/dL (ref 3.5–5.0)
Alkaline Phosphatase: 54 U/L (ref 38–126)
Anion gap: 9 (ref 5–15)
BUN: 9 mg/dL (ref 6–20)
CO2: 24 mmol/L (ref 22–32)
Calcium: 8.8 mg/dL — ABNORMAL LOW (ref 8.9–10.3)
Chloride: 107 mmol/L (ref 98–111)
Creatinine: 0.68 mg/dL (ref 0.44–1.00)
GFR, Estimated: >60 mL/min (ref >60)
Glucose, Bld: 87 mg/dL (ref 70–99)
Potassium: 4.6 mmol/L (ref 3.5–5.1)
Sodium: 140 mmol/L (ref 135–145)
Total Bilirubin: 0.3 mg/dL (ref 0.3–1.2)
Total Protein: 7.1 g/dL (ref 6.5–8.1)

## 2020-11-18 LAB — RETIC PANEL
Immature Retic Fract: 13.6 % (ref 2.3–15.9)
RBC.: 4.31 MIL/uL (ref 3.87–5.11)
Retic Count, Absolute: 45.7 10*3/uL (ref 19.0–186.0)
Retic Ct Pct: 1.1 % (ref 0.4–3.1)
Reticulocyte Hemoglobin: 28.1 pg (ref >27.9)

## 2020-11-18 LAB — VITAMIN B12: Vitamin B-12: 148 pg/mL — ABNORMAL LOW (ref 180–914)

## 2020-11-18 NOTE — Progress Notes (Signed)
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 Leonides Schanz  12/20-12/27/2021: IV feraheme 510mg  q 7 days x 2 doses and 2 doses of IM Vitamin b12 .   Interval History:  Meredith Byrd 40 y.o. female with medical history significant for iron deficiency anemia 2/2 to gastric bipass surgery who presents for a follow up visit. The patient's last visit was on 09/23/2020. In the interim since the last visit she received 2 doses of IV feraheme.  On exam today Mrs. Halk reports she has not had any improvement in her energy since her last visit.  She received 2 doses of IV Feraheme both of which went well and she did not notice any improvement in her symptoms.  She also received 2 IM shots of vitamin B12 which failed to result in any improvement.  She does that she is still craving ice and is still having large degrees of fatigue.  She is also taking the Provera shots which has reduced the severity of her menstrual cycles.  She otherwise denies any fevers, chills, sweats, nausea, vomiting or diarrhea.  She is had no other changes in her health at this time.  A full 10 point ROS is listed below.  MEDICAL HISTORY:  Past Medical History:  Diagnosis Date  . Allergy   . Anemia   . Anxiety   . Arthritis pain 08/20/2014  . Back pain   . COVID-19   . GERD (gastroesophageal reflux disease)   . History of stress test 02/2011 (GXT)    there was no evidence of ischemia, but she only went 3 1/2 minutes on the treadmill making it very difficult to get a good accurate assessment, however  . Hx of echocardiogram    The echocadiogram was essentially normal with the exception of mild mitral calcification and borderline concentric LVH, which in the setting of her hypertension at this early age is something that mean her blood pressure is well controlled.   . Hypertension   . Iron deficiency anemia, unspecified 07/09/2014  . Migraines   . Palpitations   . Swelling   . Vitamin B12 deficiency 03/17/2016    SURGICAL HISTORY: Past Surgical History:  Procedure Laterality Date  . BREAST SURGERY    . CHOLECYSTECTOMY    . GASTRIC BYPASS  2008  . HIGH RISK BREAST EXCISION Bilateral   . OOPHORECTOMY Right 2010    SOCIAL HISTORY: Social History   Socioeconomic History  . Marital status: Married    Spouse name: Damon  . Number of children: 4  . Years of education: Associates  . Highest education level: Not on file  Occupational History  . Occupation: 2011  Tobacco Use  . Smoking status: Never Smoker  . Smokeless tobacco: Never Used  Vaping Use  . Vaping Use: Never used  Substance and Sexual Activity  . Alcohol use: No  . Drug use: No  . Sexual activity: Yes  Other Topics Concern  . Not on file  Social  History Narrative   Right handed.   Lives at home with husband and four children.   No caffeine use.   Social Determinants of Health   Financial Resource Strain: Not on file  Food Insecurity: Not on file  Transportation Needs: Not on file  Physical Activity: Not on file  Stress: Not on file  Social Connections: Not on file  Intimate Partner Violence: Not on file    FAMILY HISTORY: Family History  Problem Relation Age of Onset  . Stroke Maternal Grandmother   . Heart disease Maternal Grandmother   . Hypertension Maternal Grandmother   . Hyperlipidemia Maternal Grandmother   . Breast cancer  Maternal Grandmother   . Asthma Mother   . Heart disease Mother   . Diabetes Mother   . Early death Mother   . Hypertension Mother   . Thyroid disease Mother   . Anxiety disorder Mother   . Hypertension Father   . Heart disease Father   . Breast cancer Maternal Aunt        under 65  . Breast cancer Paternal Aunt     ALLERGIES:  is allergic to doxycycline, fioricet [butalbital-apap-caffeine], inderal [propranolol], and nsaids.  MEDICATIONS:  Current Outpatient Medications  Medication Sig Dispense Refill  . acetaminophen (TYLENOL) 500 MG tablet Take 2 tablets (1,000 mg total) by mouth every 6 (six) hours as needed. 30 tablet 0  . albuterol (VENTOLIN HFA) 108 (90 Base) MCG/ACT inhaler Inhale 1-2 puffs into the lungs every 6 (six) hours as needed for wheezing or shortness of breath. 18 g 0  . clonazePAM (KLONOPIN) 2 MG tablet Take 2 mg by mouth daily as needed.    . cloNIDine (CATAPRES) 0.2 MG tablet Take 0.4 mg by mouth at bedtime.    . diazepam (VALIUM) 5 MG tablet Take 1 by mouth 1 hour  pre-procedure with very light food. May bring 2nd tablet to appointment. 2 tablet 0  . Galcanezumab-gnlm (EMGALITY) 120 MG/ML SOAJ Inject 120 mg as directed every 30 (thirty) days. 1 mL 11  . medroxyPROGESTERone Acetate 150 MG/ML SUSY INJECT 1 ML INTRAMUSCULARLY EVERY 3 MONTHS 1 mL 3  . tiZANidine (ZANAFLEX) 4 MG tablet TAKE 1 TABLET BY MOUTH TWICE A DAY AS NEEDED FOR MUSCLE SPASMS 180 tablet 0  . Ubrogepant (UBRELVY) 50 MG TABS Take 1 tab at onset of migraine.  May repeat in 2 hrs, if needed.  Max dose: 2 tabs/day. This is a 30 day prescription. 12 tablet 11  . Docusate Sodium (DSS) 100 MG CAPS docusate sodium 100 mg capsule  TAKE 1 CAPSULE BY MOUTH TWICE A DAY (Patient not taking: Reported on 11/18/2020)    . hydrocortisone (ANUSOL-HC) 2.5 % rectal cream Place 1 application rectally 2 (two) times daily. (Patient not taking: Reported on 11/18/2020) 60 g 0  . ondansetron (ZOFRAN ODT) 4 MG disintegrating  tablet Take 1 tablet (4 mg total) by mouth every 4 (four) hours as needed for nausea or vomiting. (Patient not taking: Reported on 11/18/2020) 20 tablet 0   Current Facility-Administered Medications  Medication Dose Route Frequency Provider Last Rate Last Admin  . diclofenac Sodium (VOLTAREN) 1 % topical gel 4 g  4 g Topical QID Eldred Manges, MD        REVIEW OF SYSTEMS:   Constitutional: ( - ) fevers, ( - )  chills , ( - ) night sweats Eyes: ( - ) blurriness of vision, ( - ) double vision, ( - ) watery eyes Ears, nose,  mouth, throat, and face: ( - ) mucositis, ( - ) sore throat Respiratory: ( - ) cough, ( - ) dyspnea, ( - ) wheezes Cardiovascular: ( - ) palpitation, ( - ) chest discomfort, ( - ) lower extremity swelling Gastrointestinal:  ( - ) nausea, ( - ) heartburn, ( - ) change in bowel habits Skin: ( - ) abnormal skin rashes Lymphatics: ( - ) new lymphadenopathy, ( - ) easy bruising Neurological: ( - ) numbness, ( - ) tingling, ( - ) new weaknesses Behavioral/Psych: ( - ) mood change, ( - ) new changes  All other systems were reviewed with the patient and are negative.  PHYSICAL EXAMINATION:  Vitals:   11/18/20 1532  BP: 105/69  Pulse: 69  Resp: 16  Temp: 98.1 F (36.7 C)  SpO2: 98%   Filed Weights   11/18/20 1532  Weight: 167 lb 8 oz (76 kg)    GENERAL: tired appearing middle aged [place] American female. alert, no distress and comfortable SKIN: skin color, texture, turgor are normal, no rashes or significant lesions EYES: conjunctiva are pink and non-injected, sclera clear LUNGS: clear to auscultation and percussion with normal breathing effort HEART: regular rate & rhythm and no murmurs and no lower extremity edema Musculoskeletal: no cyanosis of digits and no clubbing  PSYCH: alert & oriented x 3, fluent speech NEURO: no focal motor/sensory deficits  LABORATORY DATA:  I have reviewed the data as listed CBC Latest Ref Rng & Units 11/18/2020 09/23/2020 05/28/2020   WBC 4.0 - 10.5 K/uL 4.6 5.0 4.1  Hemoglobin 12.0 - 15.0 g/dL 10.6(L) 11.4(L) 10.3(L)  Hematocrit 36.0 - 46.0 % 34.5(L) 36.7 35.5  Platelets 150 - 400 K/uL 417(H) 530(H) 517(H)    CMP Latest Ref Rng & Units 09/23/2020 02/17/2020 11/23/2019  Glucose 70 - 99 mg/dL 81 94 917(H)  BUN 6 - 20 mg/dL 8 5(L) <1(T)  Creatinine 0.44 - 1.00 mg/dL 0.56 9.79 4.80  Sodium 135 - 145 mmol/L 139 137 140  Potassium 3.5 - 5.1 mmol/L 3.6 4.4 3.3(L)  Chloride 98 - 111 mmol/L 106 101 105  CO2 22 - 32 mmol/L 24 22 25   Calcium 8.9 - 10.3 mg/dL 9.6 9.4 9.0  Total Protein 6.5 - 8.1 g/dL 1.6(P) 7.9 -  Total Bilirubin 0.3 - 1.2 mg/dL 0.6 0.8 -  Alkaline Phos 38 - 126 U/L 68 55 -  AST 15 - 41 U/L 19 35 -  ALT 0 - 44 U/L 18 27 -    RADIOGRAPHIC STUDIES: I have personally reviewed the radiological images as listed and agreed with the findings in the report. No results found.  ASSESSMENT & PLAN Brynli M Vega 40 y.o. female with medical history significant for iron deficiency anemia 2/2 to gastric bipass surgery who presents for a follow up visit.  After review the labs, the records, schedule the patient the findings most consistent with persistent anemia in the setting of known iron deficiency anemia and vitamin B12 deficiency due to gastric bypass surgery.  The patient received 2 doses of IV iron with concurrent IM vitamin B12 and did not have any improvement in her hemoglobin or her symptoms.  Today we will recheck her iron levels and vitamin B12 levels in order to assure that these were effective in bolstering her levels.  Given her symptoms today and the decrease in hemoglobin I am concerned that she may have greater deficits than anticipated and may require additional doses.  We will plan to have the patient  return to clinic ending the results of her studies today.  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  There may also be a component of beta thalassemia trait causing her lab findings. --Today we will recheck CMP, CBC, iron panel, and ferritin. --Copper, folate, and vitamin B12 which can be low in the setting of gastric bypass surgery, though these were normal on last check --If patient is found to have continued low levels of iron would recommend proceeding with Feraheme 510 mg IV q. 7 days x 2 doses --Return to clinic pending the results of the above labs.   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   No orders of the defined types were placed in this encounter.   All questions were answered. The patient knows to call the clinic with any problems, questions or concerns.  A total of more than 30 minutes were spent on this encounter and over half of that time was spent on counseling and coordination of care as outlined above.   Ulysees Barns, MD Department of Hematology/Oncology [facility] Phone: [phone] Pager: [phone] Email: [email or web address]  11/18/2020 3:52 PM

## 2020-11-19 ENCOUNTER — Encounter: Payer: Self-pay | Admitting: Hematology and Oncology

## 2020-11-19 LAB — IRON AND TIBC
Iron: 29 ug/dL — ABNORMAL LOW (ref 41–142)
Saturation Ratios: 10 % — ABNORMAL LOW (ref 21–57)
TIBC: 283 ug/dL (ref 236–444)
UIBC: 253 ug/dL (ref 120–384)

## 2020-11-19 LAB — FERRITIN: Ferritin: 194 ng/mL (ref 11–307)

## 2020-11-20 ENCOUNTER — Telehealth: Payer: Self-pay | Admitting: *Deleted

## 2020-11-20 NOTE — Telephone Encounter (Signed)
-----   Message from Jaci Standard, MD sent at 11/20/2020 11:22 AM EST ----- Please let Meredith Byrd know that her iron levels appear to have reached normal values, however her vitamin b12 is still quite low. We will schedule her for weekly IM vitamin b12 shots x 4 to help boost these levels and hopefully improve her Hgb. We will see her back after those 4 shots to see if there is any improvement.   ----- Message ----- From: Leory Plowman, Lab [facility] [place] Sent: 11/18/2020   3:28 PM EST To: Jaci Standard, MD

## 2020-11-20 NOTE — Telephone Encounter (Signed)
TCT patient. No answer but was able to leave short message for patient return call to review her labs and inform her about her B12 injections.  Provided # [id number]

## 2020-11-21 ENCOUNTER — Other Ambulatory Visit: Payer: Self-pay

## 2020-11-21 ENCOUNTER — Telehealth: Payer: Self-pay | Admitting: Orthopaedic Surgery

## 2020-11-21 ENCOUNTER — Encounter: Payer: 59 | Admitting: Physical Therapy

## 2020-11-21 MED ORDER — DICLOFENAC SODIUM 1 % EX GEL: 4.0000 g | Freq: Four times a day (QID) | CUTANEOUS | 1 refills | Status: DC

## 2020-11-21 NOTE — Telephone Encounter (Signed)
Patient states that she has called in three times this week regarding her Rx for Voltaren Gel that Dr.Yates was to call in on 11/08/20. Her pharmacy is stating that they still have not received RX   [facility] [address]

## 2020-11-21 NOTE — Telephone Encounter (Signed)
FYI    No phone calls noted in chart. Patient came in to the office today requesting med be sent in. Dr. Ophelia Charter ordered at 11/08/2020 visit, however, it was entered into the medication module as an injection. I resent rx to pharmacy. Patient aware.

## 2020-11-23 NOTE — Telephone Encounter (Signed)
Thanks

## 2020-11-25 ENCOUNTER — Telehealth: Payer: Self-pay | Admitting: Hematology and Oncology

## 2020-11-25 DIAGNOSIS — Z5329 Procedure and treatment not carried out because of patient's decision for other reasons: Secondary | ICD-10-CM | POA: Insufficient documentation

## 2020-11-25 DIAGNOSIS — Z91199 Patient's noncompliance with other medical treatment and regimen due to unspecified reason: Secondary | ICD-10-CM | POA: Insufficient documentation

## 2020-11-25 NOTE — Telephone Encounter (Signed)
Received call back from patient. Reviewed her labs with her. Advised that Dr. Leonides Schanz recommends weekly B12 injections x4 and then he will see her back to re-check labs. Pt voiced understanding

## 2020-11-25 NOTE — Telephone Encounter (Signed)
Scheduled appt per 2/14 schmsg - pt aware of apts on schedule.

## 2020-11-27 ENCOUNTER — Encounter: Payer: Self-pay | Admitting: Physical Therapy

## 2020-11-27 ENCOUNTER — Ambulatory Visit: Payer: 59 | Admitting: Physical Therapy

## 2020-11-27 ENCOUNTER — Other Ambulatory Visit: Payer: Self-pay

## 2020-11-27 DIAGNOSIS — M6281 Muscle weakness (generalized): Secondary | ICD-10-CM | POA: Diagnosis not present

## 2020-11-27 DIAGNOSIS — M25661 Stiffness of right knee, not elsewhere classified: Secondary | ICD-10-CM

## 2020-11-27 DIAGNOSIS — M25561 Pain in right knee: Secondary | ICD-10-CM

## 2020-11-27 DIAGNOSIS — M25662 Stiffness of left knee, not elsewhere classified: Secondary | ICD-10-CM

## 2020-11-27 DIAGNOSIS — R2681 Unsteadiness on feet: Secondary | ICD-10-CM

## 2020-11-27 DIAGNOSIS — M25562 Pain in left knee: Secondary | ICD-10-CM

## 2020-11-27 DIAGNOSIS — R600 Localized edema: Secondary | ICD-10-CM | POA: Diagnosis not present

## 2020-11-27 DIAGNOSIS — R2689 Other abnormalities of gait and mobility: Secondary | ICD-10-CM

## 2020-11-27 DIAGNOSIS — G8929 Other chronic pain: Secondary | ICD-10-CM

## 2020-11-27 NOTE — Therapy (Signed)
[facility] [address] [place], [place], [address] Phone: [phone]   Fax:  [phone]  Physical Therapy Treatment & Discharge Summary  Patient Details  Name: Meredith Byrd MRN: [id number] Date of Birth: September 29, 1981 Referring Provider (PT): Benjiman Core, [place]   Encounter Date: 11/27/2020   PHYSICAL THERAPY DISCHARGE SUMMARY  Visits from Start of Care: 4  Current functional level related to goals / functional outcomes: See below   Remaining deficits: Patient has chronic knee pain limiting mobility.   See below.    Education / Equipment: HEP  Plan: Patient agrees to discharge.  Patient goals were partially met. Patient is being discharged due to  understanding of HEP.            PT End of Session - 11/27/20 0858    Visit Number 4    Number of Visits 4    Date for PT Re-Evaluation 11/21/20    Authorization Type [employer]    Authorization Time Period 10% coinsurance Deductable not met & OOP not met    PT Start Time 0845    PT Stop Time 0928    PT Time Calculation (min) 43 min    Activity Tolerance Patient tolerated treatment well;Patient limited by fatigue    Behavior During Therapy [facility] for tasks assessed/performed           Past Medical History:  Diagnosis Date  . Allergy   . Anemia   . Anxiety   . Arthritis pain 08/20/2014  . Back pain   . COVID-19   . GERD (gastroesophageal reflux disease)   . History of stress test 02/2011 (GXT)   there was no evidence of ischemia, but she only went 3 1/2 minutes on the treadmill making it very difficult to get a good accurate assessment, however  . Hx of echocardiogram    The echocadiogram was essentially normal with the exception of mild mitral calcification and borderline concentric LVH, which in the setting of her hypertension at this early age is something that mean her blood pressure is well controlled.   . Hypertension   . Iron deficiency anemia, unspecified  07/09/2014  . Migraines   . Palpitations   . Swelling   . Vitamin B12 deficiency 03/17/2016    Past Surgical History:  Procedure Laterality Date  . BREAST SURGERY    . CHOLECYSTECTOMY    . GASTRIC BYPASS  2008  . HIGH RISK BREAST EXCISION Bilateral   . OOPHORECTOMY Right 2010    There were no vitals filed for this visit.   Subjective Assessment - 11/27/20 0845    Subjective She has been having issues with anemia & fatigue.  So she has been limited in her ability to exercise.    Pertinent History Her weight was ~300# and had gastric bypass. Currently 159# for last 1.5 years.    Limitations Sitting;House hold activities;Lifting;Standing;Walking    Patient Stated Goals To improve knee pain or get to knee arthroscopy    Currently in Pain? Yes    Pain Score 8    range over last week 6/10 - 9/10   Pain Location Knee    Pain Orientation Right;Left    Pain Descriptors / Indicators Aching;Sore;Dull    Pain Type Chronic pain    Pain Onset More than a month ago    Pain Frequency Constant    Aggravating Factors  cold weather, standing    Pain Relieving Factors Voltran cream,    Pain Onset More than a month  ago              [facility] PT Assessment - 11/27/20 0845      Assessment   Medical Diagnosis Knee pain    Referring Provider (PT) Benjiman Core, PA      Observation/Other Assessments   Focus on Therapeutic Outcomes (FOTO)  48.7 %   initial was 51.3%                        [facility] Adult PT Treatment/Exercise - 11/27/20 0845      Self-Care   Self-Care ADL's    ADL's PT recommended and instructed with demo / verbal cues on using 24" bar stool with feet supported on ground intermittently to standing to offset length of time for standing ADLs which are one activity that increases her pain.   PT also verbal cues on using pillows to "tent" covers off feet to limit prolonged ankle position effecting knees & decreased resistance to turning over in bed.   Pt verbalized  understanding of both.      Knee/Hip Exercises: Stretches   Active Hamstring Stretch Right;Left;20 seconds    Active Hamstring Stretch Limitations PT advised stretches during commercials or as break to prolonged sitting like work. Pt verbalized understanding.    Gastroc Stretch Right;Left;20 seconds    Gastroc Stretch Limitations standing with forefoot on higher surface.    Other Knee/Hip Stretches Toe extension stretch standing with ground extending toes    Other Knee/Hip Stretches Passive stretch of each toe flexion & extension and using fingers to abduct toes.  These stretches should help with her plantarfacitis issues.  Rolling tennis ball under arch ant/post & med/lat sitting &/or standing (adds balance component to stance limb) Pt verbalized & return demo understanding.                  PT Education - 11/27/20 0925    Education Details Reviewed HEP with recommendation for 2 programs (one for Mon, Wed & Fir and second for Tues, Thurs & Sat) including endurance, strength, flexibility and balance.    Person(s) Educated Patient    Methods Explanation;Verbal cues    Comprehension Verbalized understanding               PT Long Term Goals - 11/27/20 1742      PT LONG TERM GOAL #1   Title Patient demonstrates understanding of ongoing HEP.    Baseline MET 11/27/2020    Time 3    Period Weeks    Status Achieved      PT LONG TERM GOAL #2   Title Patient reports 25% improvement in pain.    Baseline NOT MET 11/27/2020    Time 3    Period Weeks    Status Not Met      PT LONG TERM GOAL #3   Title FOTO score >/= 60%    Baseline Not MET 11/27/2020    Time 3    Period Weeks    Status Not Met                 Plan - 11/27/20 0859    Clinical Impression Statement Patient appears to understand Home Exercise Program to address chronic knee pain.  She has been limited in her ability to perform exercises over the last week due to fatigue with anemia.  She understands PT  recommendation to log & perform exercises to assess how chronic pain responds.    Personal Factors  and Comorbidities Comorbidity 1    Comorbidities Wear and tear due to weight > 300 pounds earlier in her life    Examination-Activity Limitations Squat;Lift;Bend;Locomotion Level;Stairs;Stand    Examination-Participation Restrictions Community Activity;Occupation    Stability/Clinical Decision Making Stable/Uncomplicated    Rehab Potential Good    PT Frequency 1x / week    PT Duration 3 weeks    PT Treatment/Interventions ADLs/Self Care Home Management;Cryotherapy;DME Instruction;Electrical Stimulation;Gait training;Stair training;Functional mobility training;Therapeutic activities;Therapeutic exercise;Balance training;Neuromuscular re-education;Patient/family education;Manual techniques;Passive range of motion;Joint Manipulations    PT Next Visit Plan discharge PT    PT Home Exercise Plan Access Code: [email or web address]    Consulted and Agree with Plan of Care Patient           Patient will benefit from skilled therapeutic intervention in order to improve the following deficits and impairments:  Abnormal gait,Cardiopulmonary status limiting activity,Decreased activity tolerance,Decreased balance,Decreased endurance,Decreased mobility,Decreased range of motion,Decreased strength,Difficulty walking,Increased edema,Impaired flexibility,Pain  Visit Diagnosis: Muscle weakness (generalized)  Localized edema  Stiffness of right knee, not elsewhere classified  Chronic pain of right knee  Chronic pain of left knee  Stiffness of left knee, not elsewhere classified  Unsteadiness on feet  Other abnormalities of gait and mobility     Problem List Patient Active Problem List   Diagnosis Date Noted  . No-show for appointment 11/25/2020  . 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 On Depo-Provera for contraception 05/31/2019  . Chronic insomnia 05/17/2019  . Lumbar facet arthropathy  12/14/2018  . Dysmenorrhea, unspecified 05/16/2018  . Knee pain, left 11/17/2017  . Generalized anxiety disorder 10/04/2017  . Morning headache 05/04/2017  . Daytime somnolence 05/04/2017  . Class 3 obesity with body mass index (BMI) of 45.0 to 49.9 in adult 03/28/2017  . Chronic low back pain 01/19/2017  . Vitamin D deficiency 01/18/2017  . Vitamin B12 deficiency 03/17/2016  . Beta thalassemia trait 03/25/2015  . Intractable migraine without aura and without status migrainosus 11/23/2014  . Elevated sed rate 08/20/2014  . Arthritis pain 08/20/2014  . Iron deficiency anemia 07/09/2014  . Symptomatic anemia 06/05/2014  . Chest pain 05/01/2014  . GERD (gastroesophageal reflux disease) 03/22/2014  . S/P gastric bypass 03/22/2014  . Insomnia 03/22/2014  . Migraine 03/22/2014  . Palpitations 03/22/2014    Jamey Reas, PT, DPT 11/27/2020, 5:48 PM  [facility] [address] [place], [place], [address] Phone: [phone]   Fax:  [phone]  Name: Meredith Byrd MRN: [id number] Date of Birth: 09-14-1981

## 2020-11-29 ENCOUNTER — Other Ambulatory Visit: Payer: Self-pay

## 2020-11-29 ENCOUNTER — Inpatient Hospital Stay: Payer: 59

## 2020-11-29 VITALS — BP 133/81 | HR 78 | Resp 18

## 2020-11-29 DIAGNOSIS — E538 Deficiency of other specified B group vitamins: Secondary | ICD-10-CM

## 2020-11-29 DIAGNOSIS — D509 Iron deficiency anemia, unspecified: Secondary | ICD-10-CM | POA: Diagnosis not present

## 2020-11-29 DIAGNOSIS — D508 Other iron deficiency anemias: Secondary | ICD-10-CM

## 2020-11-29 MED ORDER — CYANOCOBALAMIN 1000 MCG/ML IJ SOLN
1000.0000 ug | Freq: Once | INTRAMUSCULAR | Status: AC
  Administered 2020-11-29: 1000 ug via INTRAMUSCULAR

## 2020-12-02 ENCOUNTER — Telehealth: Payer: Self-pay | Admitting: Hematology and Oncology

## 2020-12-02 NOTE — Telephone Encounter (Signed)
Scheduled appt per 2/18 sch msg - pt is aware of new appts.

## 2020-12-06 ENCOUNTER — Inpatient Hospital Stay: Payer: 59

## 2020-12-07 ENCOUNTER — Other Ambulatory Visit: Payer: Self-pay

## 2020-12-07 ENCOUNTER — Inpatient Hospital Stay: Payer: 59

## 2020-12-07 VITALS — BP 114/80 | HR 92 | Temp 97.0°F | Resp 20

## 2020-12-07 DIAGNOSIS — E538 Deficiency of other specified B group vitamins: Secondary | ICD-10-CM

## 2020-12-07 DIAGNOSIS — D508 Other iron deficiency anemias: Secondary | ICD-10-CM

## 2020-12-07 DIAGNOSIS — D509 Iron deficiency anemia, unspecified: Secondary | ICD-10-CM | POA: Diagnosis not present

## 2020-12-07 MED ORDER — CYANOCOBALAMIN 1000 MCG/ML IJ SOLN
1000.0000 ug | Freq: Once | INTRAMUSCULAR | Status: AC
  Administered 2020-12-07: 1000 ug via INTRAMUSCULAR

## 2020-12-07 NOTE — Patient Instructions (Signed)

## 2020-12-13 ENCOUNTER — Ambulatory Visit: Payer: 59

## 2020-12-14 ENCOUNTER — Inpatient Hospital Stay: Payer: 59 | Attending: Hematology and Oncology

## 2020-12-14 ENCOUNTER — Other Ambulatory Visit: Payer: Self-pay

## 2020-12-14 VITALS — BP 123/81 | HR 81 | Temp 98.5°F | Resp 18

## 2020-12-14 DIAGNOSIS — D508 Other iron deficiency anemias: Secondary | ICD-10-CM

## 2020-12-14 DIAGNOSIS — E538 Deficiency of other specified B group vitamins: Secondary | ICD-10-CM | POA: Insufficient documentation

## 2020-12-14 DIAGNOSIS — D509 Iron deficiency anemia, unspecified: Secondary | ICD-10-CM | POA: Diagnosis present

## 2020-12-14 DIAGNOSIS — Z79899 Other long term (current) drug therapy: Secondary | ICD-10-CM | POA: Diagnosis not present

## 2020-12-14 MED ORDER — CYANOCOBALAMIN 1000 MCG/ML IJ SOLN
1000.0000 ug | Freq: Once | INTRAMUSCULAR | Status: AC
  Administered 2020-12-14: 1000 ug via INTRAMUSCULAR

## 2020-12-14 NOTE — Patient Instructions (Signed)

## 2020-12-17 ENCOUNTER — Encounter: Payer: Self-pay | Admitting: Orthopaedic Surgery

## 2020-12-17 ENCOUNTER — Ambulatory Visit (INDEPENDENT_AMBULATORY_CARE_PROVIDER_SITE_OTHER): Payer: 59 | Admitting: Orthopaedic Surgery

## 2020-12-17 DIAGNOSIS — G5603 Carpal tunnel syndrome, bilateral upper limbs: Secondary | ICD-10-CM

## 2020-12-17 NOTE — Progress Notes (Signed)
Office Visit Note   Patient: Meredith Byrd           Date of Birth: 01/22/1981           MRN: [id number] Visit Date: 12/17/2020              Requested by: Irena Reichmann, DO [address] [place],  [place] [address] PCP: Irena Reichmann, DO   Assessment & Plan: Visit Diagnoses:  1. Bilateral carpal tunnel syndrome     Plan: Impression is bilateral carpal tunnel syndrome left greater than right.  We have provided the patient with new carpal tunnel splints today.  We have also referred her to Dr. Alvester Morin for bilateral upper extremity nerve conduction study/EMG.  Follow-Up Instructions: Return for after NCS/EMG.   Orders:  No orders of the defined types were placed in this encounter.  No orders of the defined types were placed in this encounter.     Procedures: No procedures performed   Clinical Data: No additional findings.   Subjective: Chief Complaint  Patient presents with   Right Hand - Pain   Left Hand - Pain    HPI patient is a very pleasant 40 year old right-hand-dominant female who comes in today with bilateral hand paresthesias left greater than right for the past several years.  This is progressively worsened over time.  She does note that she does a lot of typing for which she has done for a long period of time.  Her symptoms are primarily to the long, ring and small fingers of the left hand and the entire right hand.  Her symptoms are worse when she is driving as well as when she is sleeping.  She has worn a wrist splint many years ago which did help in the beginning.  No previous nerve conduction study.  Review of Systems as detailed in HPI.  All others reviewed and are negative.   Objective: Vital Signs: There were no vitals taken for this visit.  Physical Exam well-developed well-nourished female no acute distress.  Alert oriented x3.  Ortho Exam bilateral hand exam shows a positive Phalen and positive Tinel.  No focal weakness.   No thenar atrophy.  She is neurovascular intact distally.  Specialty Comments:  No specialty comments available.  Imaging: No new imaging   PMFS History: Patient Active Problem List   Diagnosis Date Noted   No-show for appointment 11/25/2020   Chondromalacia patellae, right knee 07/12/2020   On Depo-Provera for contraception 05/31/2019   Chronic insomnia 05/17/2019   Lumbar facet arthropathy 12/14/2018   Dysmenorrhea, unspecified 05/16/2018   Knee pain, left 11/17/2017   Generalized anxiety disorder 10/04/2017   Morning headache 05/04/2017   Daytime somnolence 05/04/2017   Class 3 obesity with body mass index (BMI) of 45.0 to 49.9 in adult 03/28/2017   Chronic low back pain 01/19/2017   Vitamin D deficiency 01/18/2017   Vitamin B12 deficiency 03/17/2016   Beta thalassemia trait 03/25/2015   Intractable migraine without aura and without status migrainosus 11/23/2014   Elevated sed rate 08/20/2014   Arthritis pain 08/20/2014   Iron deficiency anemia 07/09/2014   Symptomatic anemia 06/05/2014   Chest pain 05/01/2014   GERD (gastroesophageal reflux disease) 03/22/2014   S/P gastric bypass 03/22/2014   Insomnia 03/22/2014   Migraine 03/22/2014   Palpitations 03/22/2014   Past Medical History:  Diagnosis Date   Allergy    Anemia    Anxiety    Arthritis pain 08/20/2014   Back pain  COVID-19    GERD (gastroesophageal reflux disease)    History of stress test 02/2011 ([facility])   there was no evidence of ischemia, but she only went 3 1/2 minutes on the treadmill making it very difficult to get a good accurate assessment, however   Hx of echocardiogram    The echocadiogram was essentially normal with the exception of mild mitral calcification and borderline concentric LVH, which in the setting of her hypertension at this early age is something that mean her blood pressure is well controlled.    Hypertension    Iron deficiency anemia,  unspecified 07/09/2014   Migraines    Palpitations    Swelling    Vitamin B12 deficiency 03/17/2016    Family History  Problem Relation Age of Onset   Stroke Maternal Grandmother    Heart disease Maternal Grandmother    Hypertension Maternal Grandmother    Hyperlipidemia Maternal Grandmother    Breast cancer Maternal Grandmother    Asthma Mother    Heart disease Mother    Diabetes Mother    Early death Mother    Hypertension Mother    Thyroid disease Mother    Anxiety disorder Mother    Hypertension Father    Heart disease Father    Breast cancer Maternal Aunt        under 37   Breast cancer Paternal Aunt     Past Surgical History:  Procedure Laterality Date   BREAST SURGERY     CHOLECYSTECTOMY     GASTRIC BYPASS  2008   HIGH RISK BREAST EXCISION Bilateral    OOPHORECTOMY Right 2010   Social History   Occupational History   Occupation: Insurance Claims  Tobacco Use   Smoking status: Never Smoker   Smokeless tobacco: Never Used  Building services engineer Use: Never used  Substance and Sexual Activity   Alcohol use: No   Drug use: No   Sexual activity: Yes

## 2020-12-18 ENCOUNTER — Other Ambulatory Visit: Payer: Self-pay

## 2020-12-20 ENCOUNTER — Ambulatory Visit: Payer: 59

## 2020-12-21 ENCOUNTER — Inpatient Hospital Stay: Payer: 59

## 2020-12-21 VITALS — BP 131/86 | HR 83 | Temp 98.1°F | Resp 20

## 2020-12-21 DIAGNOSIS — E538 Deficiency of other specified B group vitamins: Secondary | ICD-10-CM | POA: Diagnosis not present

## 2020-12-21 MED ORDER — CYANOCOBALAMIN 1000 MCG/ML IJ SOLN
1000.0000 ug | Freq: Once | INTRAMUSCULAR | Status: AC
  Administered 2020-12-21: 1000 ug via INTRAMUSCULAR

## 2020-12-26 ENCOUNTER — Other Ambulatory Visit: Payer: Self-pay | Admitting: Hematology and Oncology

## 2020-12-26 ENCOUNTER — Other Ambulatory Visit: Payer: Self-pay

## 2020-12-26 ENCOUNTER — Inpatient Hospital Stay: Payer: 59

## 2020-12-26 DIAGNOSIS — E538 Deficiency of other specified B group vitamins: Secondary | ICD-10-CM

## 2020-12-26 DIAGNOSIS — D508 Other iron deficiency anemias: Secondary | ICD-10-CM

## 2020-12-26 LAB — CMP (CANCER CENTER ONLY)
ALT: 23 U/L (ref 0–44)
AST: 17 U/L (ref 15–41)
Albumin: 4.4 g/dL (ref 3.5–5.0)
Alkaline Phosphatase: 58 U/L (ref 38–126)
Anion gap: 7 (ref 5–15)
BUN: 5 mg/dL — ABNORMAL LOW (ref 6–20)
CO2: 24 mmol/L (ref 22–32)
Calcium: 9.3 mg/dL (ref 8.9–10.3)
Chloride: 105 mmol/L (ref 98–111)
Creatinine: 0.74 mg/dL (ref 0.44–1.00)
GFR, Estimated: >60 mL/min (ref >60)
Glucose, Bld: 92 mg/dL (ref 70–99)
Potassium: 3.4 mmol/L — ABNORMAL LOW (ref 3.5–5.1)
Sodium: 136 mmol/L (ref 135–145)
Total Bilirubin: 0.4 mg/dL (ref 0.3–1.2)
Total Protein: 8.1 g/dL (ref 6.5–8.1)

## 2020-12-26 LAB — CBC WITH DIFFERENTIAL (CANCER CENTER ONLY)
Abs Immature Granulocytes: 0 10*3/uL (ref 0.00–0.07)
Basophils Absolute: 0 10*3/uL (ref 0.0–0.1)
Basophils Relative: 0 %
Eosinophils Absolute: 0 10*3/uL (ref 0.0–0.5)
Eosinophils Relative: 1 %
HCT: 37.4 % (ref 36.0–46.0)
Hemoglobin: 11.8 g/dL — ABNORMAL LOW (ref 12.0–15.0)
Immature Granulocytes: 0 %
Lymphocytes Relative: 46 %
Lymphs Abs: 2 10*3/uL (ref 0.7–4.0)
MCH: 25.1 pg — ABNORMAL LOW (ref 26.0–34.0)
MCHC: 31.6 g/dL (ref 30.0–36.0)
MCV: 79.4 fL — ABNORMAL LOW (ref 80.0–100.0)
Monocytes Absolute: 0.2 10*3/uL (ref 0.1–1.0)
Monocytes Relative: 5 %
Neutro Abs: 2.1 10*3/uL (ref 1.7–7.7)
Neutrophils Relative %: 48 %
Platelet Count: 432 10*3/uL — ABNORMAL HIGH (ref 150–400)
RBC: 4.71 MIL/uL (ref 3.87–5.11)
RDW: 15.7 % — ABNORMAL HIGH (ref 11.5–15.5)
WBC Count: 4.3 10*3/uL (ref 4.0–10.5)
nRBC: 0 % (ref 0.0–0.2)

## 2020-12-26 LAB — FOLATE: Folate: 4.2 ng/mL — ABNORMAL LOW (ref >5.9)

## 2020-12-26 LAB — VITAMIN B12: Vitamin B-12: 588 pg/mL (ref 180–914)

## 2020-12-27 ENCOUNTER — Encounter: Payer: Self-pay | Admitting: Hematology and Oncology

## 2020-12-27 ENCOUNTER — Inpatient Hospital Stay (HOSPITAL_BASED_OUTPATIENT_CLINIC_OR_DEPARTMENT_OTHER): Payer: 59 | Admitting: Hematology and Oncology

## 2020-12-27 ENCOUNTER — Other Ambulatory Visit: Payer: Self-pay

## 2020-12-27 VITALS — BP 121/76 | HR 77 | Temp 98.1°F | Resp 20 | Ht <= 58 in | Wt 158.1 lb

## 2020-12-27 DIAGNOSIS — D563 Thalassemia minor: Secondary | ICD-10-CM

## 2020-12-27 DIAGNOSIS — D508 Other iron deficiency anemias: Secondary | ICD-10-CM | POA: Diagnosis not present

## 2020-12-27 DIAGNOSIS — E538 Deficiency of other specified B group vitamins: Secondary | ICD-10-CM | POA: Diagnosis not present

## 2020-12-27 LAB — IRON AND TIBC
Iron: 59 ug/dL (ref 41–142)
Saturation Ratios: 17 % — ABNORMAL LOW (ref 21–57)
TIBC: 355 ug/dL (ref 236–444)
UIBC: 296 ug/dL (ref 120–384)

## 2020-12-27 LAB — FERRITIN: Ferritin: 192 ng/mL (ref 11–307)

## 2020-12-27 MED ORDER — FOLIC ACID 1 MG PO TABS: 1.0000 mg | ORAL_TABLET | Freq: Every day | ORAL | 1 refills | Status: DC

## 2020-12-27 NOTE — Progress Notes (Signed)
[facility] [phone]   [phone]  PROGRESS NOTE  Patient Care Team: Irena Reichmannollins, Dana, DO as PCP - General (Family Medicine) Artis DelayGorsuch, Ni, MD as Consulting Physician (Hematology and Oncology) Lavina HammanMeisinger, Todd, MD as Consulting Physician (Obstetrics and Gynecology)  Hematological/Oncological History # Iron Deficiency Anemia 2/2 to Gastric Bipass Surgery 10/08/2009: WBC 5.8, Hgb 10.2, MCV 73.1, Plt 458 03/25/2015: WBC 7.0, Hgb 12.5, MCV 77.2, Plt 419 03/17/2016: WBC 3.8, Hgb 10.9, Plt 366, MCV 77.1 05/28/2020: WBC 4.1, Hgb 10.3, MCV 75.2, Plt 517 09/23/2020: establish care with Dr. Leonides Schanzorsey  12/20-12/27/2021: IV feraheme 510mg  q 7 days x 2 doses and 2 doses of IM Vitamin b12 1000mcg.  12/26/2020: WBC 4.3, Hgb 11.8, MCV 79.4, Plt 432  Interval History:  Meredith Byrd 40 y.o. female with medical history significant for iron deficiency anemia 2/2 to gastric bipass surgery who presents for a follow up visit. The patient's last visit was on 11/18/2020. In the interim since the last visit she received 4 doses of IM vitamin b12.   On exam today Meredith Byrd reports is tolerated the IM vitamin B12 shots well.  She is had no improvement in her energy levels unfortunately.  She notes that she continues to have ice cravings as well as chills and feeling cold.  She is not having any shortness of breath.  She also is not having any overt bleeding, though she does have bruising on her lower extremities.  She otherwise denies any fevers, sweats, nausea, vomiting or diarrhea.  She is had no other changes in her health at this time.  A full 10 point ROS is listed below.  MEDICAL HISTORY:  Past Medical History:  Diagnosis Date  . Allergy   . Anemia   . Anxiety   . Arthritis pain 08/20/2014  . Back pain   . COVID-19   . GERD (gastroesophageal reflux disease)   . History of stress test 02/2011 ([facility])   there was no evidence of ischemia, but she only went 3 1/2 minutes  on the treadmill making it very difficult to get a good accurate assessment, however  . Hx of echocardiogram    The echocadiogram was essentially normal with the exception of mild mitral calcification and borderline concentric LVH, which in the setting of her hypertension at this early age is something that mean her blood pressure is well controlled.   . Hypertension   . Iron deficiency anemia, unspecified 07/09/2014  . Migraines   . Palpitations   . Swelling   . Vitamin B12 deficiency 03/17/2016    SURGICAL HISTORY: Past Surgical History:  Procedure Laterality Date  . BREAST SURGERY    . CHOLECYSTECTOMY    . GASTRIC BYPASS  2008  . HIGH RISK BREAST EXCISION Bilateral   . OOPHORECTOMY Right 2010    SOCIAL HISTORY: Social History   Socioeconomic History  . Marital status: Married    Spouse name: Damon  . Number of children: 4  . Years of education: Associates  . Highest education level: Not on file  Occupational History  . 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 Smoking status: Never Smoker  . Smokeless tobacco: Never Used  Vaping Use  . Vaping Use: Never used  Substance and Sexual Activity  . Alcohol use: No  . Drug use: No  . Sexual activity: Yes  Other Topics Concern  . Not on file  Social History Narrative   Right handed.   Lives at home with husband and  four children.   No caffeine use.   Social Determinants of Health   Financial Resource Strain: Not on file  Food Insecurity: Not on file  Transportation Needs: Not on file  Physical Activity: Not on file  Stress: Not on file  Social Connections: Not on file  Intimate Partner Violence: Not on file    FAMILY HISTORY: Family History  Problem Relation Age of Onset  . Stroke Maternal Grandmother   . Heart disease Maternal Grandmother   . Hypertension Maternal Grandmother   . Hyperlipidemia Maternal Grandmother   . Breast cancer Maternal Grandmother   . Asthma Mother   . Heart disease Mother   .  Diabetes Mother   . Early death Mother   . Hypertension Mother   . Thyroid disease Mother   . Anxiety disorder Mother   . Hypertension Father   . Heart disease Father   . Breast cancer Maternal Aunt        under 65  . Breast cancer Paternal Aunt     ALLERGIES:  is allergic to doxycycline, fioricet [butalbital-apap-caffeine], inderal [propranolol], and nsaids.  MEDICATIONS:  Current Outpatient Medications  Medication Sig Dispense Refill  . folic acid (FOLVITE) 1 MG tablet Take 1 tablet (1 mg total) by mouth daily. 90 tablet 1  . acetaminophen (TYLENOL) 500 MG tablet Take 2 tablets (1,000 mg total) by mouth every 6 (six) hours as needed. 30 tablet 0  . albuterol (VENTOLIN HFA) 108 (90 Base) MCG/ACT inhaler Inhale 1-2 puffs into the lungs every 6 (six) hours as needed for wheezing or shortness of breath. 18 g 0  . clonazePAM (KLONOPIN) 2 MG tablet Take 2 mg by mouth daily as needed.    . cloNIDine (CATAPRES) 0.2 MG tablet Take 0.4 mg by mouth at bedtime.    . diazepam (VALIUM) 5 MG tablet Take 1 by mouth 1 hour  pre-procedure with very light food. May bring 2nd tablet to appointment. 2 tablet 0  . diclofenac Sodium (VOLTAREN) 1 % GEL Apply 4 g topically 4 (four) times daily. 150 g 1  . Docusate Sodium (DSS) 100 MG CAPS docusate sodium 100 mg capsule  TAKE 1 CAPSULE BY MOUTH TWICE A DAY (Patient not taking: Reported on 11/18/2020)    . Galcanezumab-gnlm (EMGALITY) 120 MG/ML SOAJ Inject 120 mg as directed every 30 (thirty) days. 1 mL 11  . hydrocortisone (ANUSOL-HC) 2.5 % rectal cream Place 1 application rectally 2 (two) times daily. (Patient not taking: Reported on 11/18/2020) 60 g 0  . medroxyPROGESTERone (DEPO-PROVERA) 150 MG/ML injection     . ondansetron (ZOFRAN ODT) 4 MG disintegrating tablet Take 1 tablet (4 mg total) by mouth every 4 (four) hours as needed for nausea or vomiting. (Patient not taking: Reported on 11/18/2020) 20 tablet 0  . tiZANidine (ZANAFLEX) 4 MG tablet TAKE 1 TABLET BY  MOUTH TWICE A DAY AS NEEDED FOR MUSCLE SPASMS 180 tablet 0  . traZODone (DESYREL) 100 MG tablet     . Ubrogepant (UBRELVY) 50 MG TABS Take 1 tab at onset of migraine.  May repeat in 2 hrs, if needed.  Max dose: 2 tabs/day. This is a 30 day prescription. 12 tablet 11   Current Facility-Administered Medications  Medication Dose Route Frequency Provider Last Rate Last Admin  . diclofenac Sodium (VOLTAREN) 1 % topical gel 4 g  4 g Topical QID Eldred Manges, MD        REVIEW OF SYSTEMS:   Constitutional: ( - ) fevers, ( - )  chills , ( - ) night sweats Eyes: ( - ) blurriness of vision, ( - ) double vision, ( - ) watery eyes Ears, nose, mouth, throat, and face: ( - ) mucositis, ( - ) sore throat Respiratory: ( - ) cough, ( - ) dyspnea, ( - ) wheezes Cardiovascular: ( - ) palpitation, ( - ) chest discomfort, ( - ) lower extremity swelling Gastrointestinal:  ( - ) nausea, ( - ) heartburn, ( - ) change in bowel habits Skin: ( - ) abnormal skin rashes Lymphatics: ( - ) new lymphadenopathy, ( - ) easy bruising Neurological: ( - ) numbness, ( - ) tingling, ( - ) new weaknesses Behavioral/Psych: ( - ) mood change, ( - ) new changes  All other systems were reviewed with the patient and are negative.  PHYSICAL EXAMINATION:  Vitals:   12/27/20 1601  BP: 121/76  Pulse: 77  Resp: 20  Temp: 98.1 F (36.7 C)  SpO2: 100%   Filed Weights   12/27/20 1601  Weight: 158 lb 1.6 oz (71.7 kg)    GENERAL: tired appearing middle aged [place] American female. alert, no distress and comfortable SKIN: skin color, texture, turgor are normal, no rashes or significant lesions EYES: conjunctiva are pink and non-injected, sclera clear LUNGS: clear to auscultation and percussion with normal breathing effort HEART: regular rate & rhythm and no murmurs and no lower extremity edema Musculoskeletal: no cyanosis of digits and no clubbing  PSYCH: alert & oriented x 3, fluent speech NEURO: no focal motor/sensory  deficits  LABORATORY DATA:  I have reviewed the data as listed CBC Latest Ref Rng & Units 12/26/2020 11/18/2020 09/23/2020  WBC 4.0 - 10.5 K/uL 4.3 4.6 5.0  Hemoglobin 12.0 - 15.0 g/dL 11.8(L) 10.6(L) 11.4(L)  Hematocrit 36.0 - 46.0 % 37.4 34.5(L) 36.7  Platelets 150 - 400 K/uL 432(H) 417(H) 530(H)    CMP Latest Ref Rng & Units 12/26/2020 11/18/2020 09/23/2020  Glucose 70 - 99 mg/dL 92 87 81  BUN 6 - 20 mg/dL 5(L) 9 8  Creatinine 1.01 - 1.00 mg/dL 7.51 0.25 8.52  Sodium 135 - 145 mmol/L 136 140 139  Potassium 3.5 - 5.1 mmol/L 3.4(L) 4.6 3.6  Chloride 98 - 111 mmol/L 105 107 106  CO2 22 - 32 mmol/L 24 24 24   Calcium 8.9 - 10.3 mg/dL 9.3 ) 9.6  Total Protein 6.5 - 8.1 g/dL 8.1 7.1 7.7(O)  Total Bilirubin 0.3 - 1.2 mg/dL 0.4 0.3 0.6  Alkaline Phos 38 - 126 U/L 58 54 68  AST 15 - 41 U/L 17 21 19   ALT 0 - 44 U/L 23 22 18     RADIOGRAPHIC STUDIES: I have personally reviewed the radiological images as listed and agreed with the findings in the report. No results found.  ASSESSMENT & PLAN Meredith Byrd 40 y.o. female with medical history significant for iron deficiency anemia 2/2 to gastric bipass surgery who presents for a follow up visit.  After review the labs, the records, schedule the patient the findings most consistent with persistent anemia in the setting of known iron deficiency anemia and vitamin B12 deficiency due to gastric bypass surgery.  The patient received 2 doses of IV iron with weekly IM vitamin B12 x 1 month and did not have any improvement in her hemoglobin or her symptoms.  Today we will recheck her iron levels and vitamin B12 levels in order to assure that these were effective in bolstering her levels.  Given her symptoms today  and the decrease in hemoglobin I am concerned that she may have greater deficits than anticipated and may require additional doses.  We will plan to have the patient return to clinic ending the results of her studies today.  # Iron  Deficiency Anemia 2/2 to Gastric Bipass Surgery #Vitamin B12 Deficiency 2/2 to Gastric Bipass Surgery --Findings are most consistent with iron deficiency anemia/Vitamin B12 deficiency secondary to poor absorption in the setting of gastric bypass surgery.  There may also be a component of beta thalassemia trait causing her lab findings. --Today we will recheck CMP, CBC, iron panel, and ferritin. --If patient is found to have continued low levels of iron would recommend proceeding with Feraheme 510 mg IV q. 7 days x 2 doses --Blood levels were found to be low today recommend folic acid 1 mcg p.o. daily, called this into her pharmacy. --Return to clinic in 3 months with monthly Vitamin b12 IM injections.   #Fatigue --Patient still has marked fatigue despite adequate repletion of iron and vitamin B12.  She is low in folate but that should not cause low energy. --We will check TSH and hemoglobin A1c today as well. --If these do do not reveal the etiology I am uncertain what the cause is.  Would recommend continued follow-up with her PCP.  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   Orders Placed This Encounter  Procedures  . TSH    Standing Status:   Future    Number of Occurrences:   1    Standing Expiration Date:   12/27/2021  . Hemoglobin A1c    Standing Status:   Future    Number of Occurrences:   1    Standing Expiration Date:   12/27/2021    All questions were answered. The patient knows to call the clinic with any problems, questions or concerns.  A total of more than 30 minutes were spent on this encounter and over half of that time was spent on counseling and coordination of care as outlined above.   Ulysees Barns, MD Department of Hematology/Oncology [facility] Phone: [phone] Pager: [phone] Email:  [email or web address]  12/27/2020 4:29 PM

## 2020-12-28 ENCOUNTER — Encounter: Payer: Self-pay | Admitting: Hematology and Oncology

## 2020-12-28 LAB — HEMOGLOBIN A1C
Hgb A1c MFr Bld: 4.6 % — ABNORMAL LOW (ref 4.8–5.6)
Mean Plasma Glucose: 85 mg/dL

## 2020-12-30 LAB — TSH: TSH: 1.958 u[IU]/mL (ref 0.308–3.960)

## 2021-01-07 ENCOUNTER — Encounter: Payer: Self-pay | Admitting: Hematology and Oncology

## 2021-01-15 ENCOUNTER — Other Ambulatory Visit: Payer: Self-pay

## 2021-01-15 ENCOUNTER — Encounter: Payer: Self-pay | Admitting: Physical Medicine and Rehabilitation

## 2021-01-15 ENCOUNTER — Ambulatory Visit (INDEPENDENT_AMBULATORY_CARE_PROVIDER_SITE_OTHER): Payer: 59 | Admitting: Physical Medicine and Rehabilitation

## 2021-01-15 DIAGNOSIS — R202 Paresthesia of skin: Secondary | ICD-10-CM | POA: Diagnosis not present

## 2021-01-15 NOTE — Progress Notes (Signed)
Numbness and pain in both hands left worse than right. Third and fourth fingers on left are worse; all fingers on right. Pain up left arm. Right hand dominant No lotion per patient Numeric Pain Rating Scale and Functional Assessment Average Pain 8   In the last MONTH (on 0-10 scale) has pain interfered with the following?  1. General activity like being  able to carry out your everyday physical activities such as walking, climbing stairs, carrying groceries, or moving a chair?  Rating(8)

## 2021-01-16 NOTE — Procedures (Signed)
EMG & NCV Findings: Evaluation of the right median motor nerve showed prolonged distal onset latency (4.5 ms) and decreased conduction velocity (Elbow-Wrist, 49 m/s).  The right median (across palm) sensory nerve showed prolonged distal peak latency (Wrist, 4.2 ms).  All remaining nerves (as indicated in the following tables) were within normal limits.  Left vs. Right side comparison data for the median motor nerve indicates abnormal L-R latency difference (0.8 ms).  All remaining left vs. right side differences were within normal limits.    All examined muscles (as indicated in the following table) showed no evidence of electrical instability.    Impression: The above electrodiagnostic study is ABNORMAL and reveals evidence of a moderate right median nerve entrapment at the wrist (carpal tunnel syndrome) affecting sensory and motor components.   There is no significant electrodiagnostic evidence of any other focal nerve entrapment, brachial plexopathy or cervical radiculopathy. Specifically NORMAL Left.  Recommendations: 1.  Follow-up with referring physician. 2.  Continue current management of symptoms.  ___________________________ Naaman Plummer FAAPMR Board Certified, American Board of Physical Medicine and Rehabilitation    Nerve Conduction Studies Anti Sensory Summary Table   Stim Site NR Peak (ms) Norm Peak (ms) P-T Amp (V) Norm P-T Amp Site1 Site2 Delta-P (ms) Dist (cm) Vel (m/s) Norm Vel (m/s)  Left Median Acr Palm Anti Sensory (2nd Digit)  31.2C  Wrist    3.3 <3.6 29.1 >10 Wrist Palm 1.7 0.0    Palm    1.6 <2.0 37.2         Right Median Acr Palm Anti Sensory (2nd Digit)  31.4C  Wrist    *4.2 <3.6 12.0 >10 Wrist Palm 2.4 0.0    Palm    1.8 <2.0 27.2         Left Radial Anti Sensory (Base 1st Digit)  30.4C  Wrist    2.1 <3.1 41.6  Wrist Base 1st Digit 2.1 0.0    Right Radial Anti Sensory (Base 1st Digit)  32C  Wrist    2.1 <3.1 50.6  Wrist Base 1st Digit 2.1 0.0    Left  Ulnar Anti Sensory (5th Digit)  31.4C  Wrist    3.1 <3.7 37.5 >15.0 Wrist 5th Digit 3.1 14.0 45 >38  Right Ulnar Anti Sensory (5th Digit)  31.9C  Wrist    3.1 <3.7 34.0 >15.0 Wrist 5th Digit 3.1 14.0 45 >38   Motor Summary Table   Stim Site NR Onset (ms) Norm Onset (ms) O-P Amp (mV) Norm O-P Amp Site1 Site2 Delta-0 (ms) Dist (cm) Vel (m/s) Norm Vel (m/s)  Left Median Motor (Abd Poll Brev)  30.8C  Wrist    3.7 <4.2 9.9 >5 Elbow Wrist 3.6 18.3 51 >50  Elbow    7.3  10.5         Right Median Motor (Abd Poll Brev)  32.3C  Wrist    *4.5 <4.2 11.9 >5 Elbow Wrist 3.8 18.5 *49 >50  Elbow    8.3  13.1         Left Ulnar Motor (Abd Dig Min)  31.2C  Wrist    2.4 <4.2 12.2 >3 B Elbow Wrist 3.0 18.0 60 >53  B Elbow    5.4  12.8  A Elbow B Elbow 1.2 10.0 83 >53  A Elbow    6.6  5.5         Right Ulnar Motor (Abd Dig Min)  32.4C  Wrist    2.3 <4.2 12.9 >3 B Elbow Wrist  3.2 17.0 53 >53  B Elbow    5.5  12.7  A Elbow B Elbow 1.1 10.0 91 >53  A Elbow    6.6  12.6          EMG   Side Muscle Nerve Root Ins Act Fibs Psw Amp Dur Poly Recrt Int Dennie Bible Comment  Left Abd Poll Brev Median C8-T1 Nml Nml Nml Nml Nml 0 Nml Nml   Left 1stDorInt Ulnar C8-T1 Nml Nml Nml Nml Nml 0 Nml Nml   Left PronatorTeres Median C6-7 Nml Nml Nml Nml Nml 0 Nml Nml   Left Biceps Musculocut C5-6 Nml Nml Nml Nml Nml 0 Nml Nml   Left Deltoid Axillary C5-6 Nml Nml Nml Nml Nml 0 Nml Nml     Nerve Conduction Studies Anti Sensory Left/Right Comparison   Stim Site L Lat (ms) R Lat (ms) L-R Lat (ms) L Amp (V) R Amp (V) L-R Amp (%) Site1 Site2 L Vel (m/s) R Vel (m/s) L-R Vel (m/s)  Median Acr Palm Anti Sensory (2nd Digit)  31.2C  Wrist 3.3 *4.2 0.9 29.1 12.0 58.8 Wrist Palm     Palm 1.6 1.8 0.2 37.2 27.2 26.9       Radial Anti Sensory (Base 1st Digit)  30.4C  Wrist 2.1 2.1 0.0 41.6 50.6 17.8 Wrist Base 1st Digit     Ulnar Anti Sensory (5th Digit)  31.4C  Wrist 3.1 3.1 0.0 37.5 34.0 9.3 Wrist 5th Digit 45 45 0   Motor  Left/Right Comparison   Stim Site L Lat (ms) R Lat (ms) L-R Lat (ms) L Amp (mV) R Amp (mV) L-R Amp (%) Site1 Site2 L Vel (m/s) R Vel (m/s) L-R Vel (m/s)  Median Motor (Abd Poll Brev)  30.8C  Wrist 3.7 *4.5 *0.8 9.9 11.9 16.8 Elbow Wrist 51 *49 2  Elbow 7.3 8.3 1.0 10.5 13.1 19.8       Ulnar Motor (Abd Dig Min)  31.2C  Wrist 2.4 2.3 0.1 12.2 12.9 5.4 B Elbow Wrist 60 53 7  B Elbow 5.4 5.5 0.1 12.8 12.7 0.8 A Elbow B Elbow 83 91 8  A Elbow 6.6 6.6 0.0 5.5 12.6 56.3          Waveforms:

## 2021-01-16 NOTE — Progress Notes (Signed)
Meredith Byrd - 40 y.o. female MRN [id number]  Date of birth: 10-Nov-1980  Office Visit Note: Visit Date: 01/15/2021 PCP: Irena Reichmann, DO Referred by: Irena Reichmann, DO  Subjective: Chief Complaint  Patient presents with  . Right Hand - Pain, Numbness  . Left Hand - Pain, Numbness   HPI:  Meredith Byrd is a 40 y.o. female who comes in today at the request of Dr. Glee Arvin for electrodiagnostic study of the Bilateral upper extremities.  Patient is Right hand dominant.  She reports chronic worsening severe 8 out of 10 pain with numbness and tingling into both hands left more than right.  She reports this more in the third and fourth digits on the left and essentially globally on the right.  She reports some pain up the left arm.  She denies any frank radicular symptoms.  She is not diabetic.  No history of fibromyalgia.  No history of prior electrodiagnostic study.    ROS Otherwise per HPI.  Assessment & Plan: Visit Diagnoses:    ICD-10-CM   1. Paresthesia of skin  R20.2 NCV with EMG (electromyography)    Plan: Impression: The above electrodiagnostic study is ABNORMAL and reveals evidence of a moderate right median nerve entrapment at the wrist (carpal tunnel syndrome) affecting sensory and motor components.   There is no significant electrodiagnostic evidence of any other focal nerve entrapment, brachial plexopathy or cervical radiculopathy. Specifically NORMAL Left.  Recommendations: 1.  Follow-up with referring physician. 2.  Continue current management of symptoms.  Meds & Orders: No orders of the defined types were placed in this encounter.   Orders Placed This Encounter  Procedures  . NCV with EMG (electromyography)    Follow-up: Return in 2 weeks (on 01/29/2021) for Glee Arvin, MD.   Procedures: No procedures performed  EMG & NCV Findings: Evaluation of the right median motor nerve showed prolonged distal onset latency (4.5 ms) and decreased  conduction velocity (Elbow-Wrist, 49 m/s).  The right median (across palm) sensory nerve showed prolonged distal peak latency (Wrist, 4.2 ms).  All remaining nerves (as indicated in the following tables) were within normal limits.  Left vs. Right side comparison data for the median motor nerve indicates abnormal L-R latency difference (0.8 ms).  All remaining left vs. right side differences were within normal limits.    All examined muscles (as indicated in the following table) showed no evidence of electrical instability.    Impression: The above electrodiagnostic study is ABNORMAL and reveals evidence of a moderate right median nerve entrapment at the wrist (carpal tunnel syndrome) affecting sensory and motor components.   There is no significant electrodiagnostic evidence of any other focal nerve entrapment, brachial plexopathy or cervical radiculopathy. Specifically NORMAL Left.  Recommendations: 1.  Follow-up with referring physician. 2.  Continue current management of symptoms.  ___________________________ Naaman Plummer FAAPMR Board Certified, American Board of Physical Medicine and Rehabilitation    Nerve Conduction Studies Anti Sensory Summary Table   Stim Site NR Peak (ms) Norm Peak (ms) P-T Amp (V) Norm P-T Amp Site1 Site2 Delta-P (ms) Dist (cm) Vel (m/s) Norm Vel (m/s)  Left Median Acr Palm Anti Sensory (2nd Digit)  31.2C  Wrist    3.3 <3.6 29.1 >10 Wrist Palm 1.7 0.0    Palm    1.6 <2.0 37.2         Right Median Acr Palm Anti Sensory (2nd Digit)  31.4C  Wrist    *4.2 <3.6 12.0 >10 Wrist Palm  2.4 0.0    Palm    1.8 <2.0 27.2         Left Radial Anti Sensory (Base 1st Digit)  30.4C  Wrist    2.1 <3.1 41.6  Wrist Base 1st Digit 2.1 0.0    Right Radial Anti Sensory (Base 1st Digit)  32C  Wrist    2.1 <3.1 50.6  Wrist Base 1st Digit 2.1 0.0    Left Ulnar Anti Sensory (5th Digit)  31.4C  Wrist    3.1 <3.7 37.5 >15.0 Wrist 5th Digit 3.1 14.0 45 >38  Right Ulnar Anti  Sensory (5th Digit)  31.9C  Wrist    3.1 <3.7 34.0 >15.0 Wrist 5th Digit 3.1 14.0 45 >38   Motor Summary Table   Stim Site NR Onset (ms) Norm Onset (ms) O-P Amp (mV) Norm O-P Amp Site1 Site2 Delta-0 (ms) Dist (cm) Vel (m/s) Norm Vel (m/s)  Left Median Motor (Abd Poll Brev)  30.8C  Wrist    3.7 <4.2 9.9 >5 Elbow Wrist 3.6 18.3 51 >50  Elbow    7.3  10.5         Right Median Motor (Abd Poll Brev)  32.3C  Wrist    *4.5 <4.2 11.9 >5 Elbow Wrist 3.8 18.5 *49 >50  Elbow    8.3  13.1         Left Ulnar Motor (Abd Dig Min)  31.2C  Wrist    2.4 <4.2 12.2 >3 B Elbow Wrist 3.0 18.0 60 >53  B Elbow    5.4  12.8  A Elbow B Elbow 1.2 10.0 83 >53  A Elbow    6.6  5.5         Right Ulnar Motor (Abd Dig Min)  32.4C  Wrist    2.3 <4.2 12.9 >3 B Elbow Wrist 3.2 17.0 53 >53  B Elbow    5.5  12.7  A Elbow B Elbow 1.1 10.0 91 >53  A Elbow    6.6  12.6          EMG   Side Muscle Nerve Root Ins Act Fibs Psw Amp Dur Poly Recrt Int Dennie Bible Comment  Left Abd Poll Brev Median C8-T1 Nml Nml Nml Nml Nml 0 Nml Nml   Left 1stDorInt Ulnar C8-T1 Nml Nml Nml Nml Nml 0 Nml Nml   Left PronatorTeres Median C6-7 Nml Nml Nml Nml Nml 0 Nml Nml   Left Biceps Musculocut C5-6 Nml Nml Nml Nml Nml 0 Nml Nml   Left Deltoid Axillary C5-6 Nml Nml Nml Nml Nml 0 Nml Nml     Nerve Conduction Studies Anti Sensory Left/Right Comparison   Stim Site L Lat (ms) R Lat (ms) L-R Lat (ms) L Amp (V) R Amp (V) L-R Amp (%) Site1 Site2 L Vel (m/s) R Vel (m/s) L-R Vel (m/s)  Median Acr Palm Anti Sensory (2nd Digit)  31.2C  Wrist 3.3 *4.2 0.9 29.1 12.0 58.8 Wrist Palm     Palm 1.6 1.8 0.2 37.2 27.2 26.9       Radial Anti Sensory (Base 1st Digit)  30.4C  Wrist 2.1 2.1 0.0 41.6 50.6 17.8 Wrist Base 1st Digit     Ulnar Anti Sensory (5th Digit)  31.4C  Wrist 3.1 3.1 0.0 37.5 34.0 9.3 Wrist 5th Digit 45 45 0   Motor Left/Right Comparison   Stim Site L Lat (ms) R Lat (ms) L-R Lat (ms) L Amp (mV) R Amp (mV) L-R Amp (%) Site1 Site2 L Vel  (  m/s) R Vel (m/s) L-R Vel (m/s)  Median Motor (Abd Poll Brev)  30.8C  Wrist 3.7 *4.5 *0.8 9.9 11.9 16.8 Elbow Wrist 51 *49 2  Elbow 7.3 8.3 1.0 10.5 13.1 19.8       Ulnar Motor (Abd Dig Min)  31.2C  Wrist 2.4 2.3 0.1 12.2 12.9 5.4 B Elbow Wrist 60 53 7  B Elbow 5.4 5.5 0.1 12.8 12.7 0.8 A Elbow B Elbow 83 91 8  A Elbow 6.6 6.6 0.0 5.5 12.6 56.3          Waveforms:                      Clinical History: MRI LUMBAR SPINE WITHOUT CONTRAST  TECHNIQUE: Multiplanar, multisequence MR imaging of the lumbar spine was performed. No intravenous contrast was administered.  COMPARISON: Prior radiograph from 10/13/2014.  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 L5-S1 disc is rudimentary.  Alignment: Physiologic.  Vertebrae: Vertebral body height maintained without evidence for acute or chronic fracture. Bone marrow signal intensity diffusely decreased on T1 weighted imaging most commonly related to anemia, smoking, or obesity. No discrete or worrisome osseous lesions. No abnormal marrow edema.  Conus medullaris and cauda equina: Conus extends to the L1 level. Conus and cauda equina appear normal.  Paraspinal and other soft tissues: Paraspinous soft tissues within normal limits. Visualized visceral structures within normal limits.  Disc levels:  T11-12: Tiny right paracentral disc protrusion. No stenosis or impingement.  T12-L1: Unremarkable.  L1-2: Unremarkable.  L2-3: Negative interspace. Moderate bilateral facet hypertrophy. No significant canal or foraminal stenosis. No impingement.  L3-4: Negative interspace. Moderate facet and ligament flavum hypertrophy. No significant canal or foraminal stenosis. No impingement.  L4-5: No significant disc bulge. Severe left with moderate right facet hypertrophy. No significant stenosis or impingement.  L5-S1: Transitional lumbosacral anatomy with rudimentary  L5-S1 disc. No stenosis.  IMPRESSION: 1. Moderate to advanced multilevel facet hypertrophy at L2-3 through L4-5, most notable at L4-5 on the left. Finding could contribute to chronic low back pain. 2. Tiny right paracentral disc protrusion at T11-12 without stenosis. No other significant disc pathology identified. No stenosis or impingement. 3. Transitional lumbosacral anatomy. Careful correlation with numbering system on this exam recommended prior to any potential future planned intervention.   Electronically Signed  By: Rise Mu M.D.  On: 12/12/2018 22:05     Objective:  VS:  HT:    WT:   BMI:     BP:   HR: bpm  TEMP: ( )  RESP:  Physical Exam Musculoskeletal:        General: No swelling, tenderness or deformity.     Comments: Inspection reveals no atrophy of the bilateral APB or FDI or hand intrinsics. There is no swelling, color changes, allodynia or dystrophic changes. There is 5 out of 5 strength in the bilateral wrist extension, finger abduction and long finger flexion. There is intact sensation to light touch in all dermatomal and peripheral nerve distributions.  There is a positive Phalen's test bilaterally. There is a negative Hoffmann's test bilaterally.  Skin:    General: Skin is warm and dry.     Findings: No erythema or rash.  Neurological:     General: No focal deficit present.     Mental Status: She is alert and oriented to person, place, and time.     Motor: No weakness or abnormal muscle tone.     Coordination: Coordination normal.  Psychiatric:        Mood and Affect: Mood normal.        Behavior: Behavior normal.      Imaging: No results found.

## 2021-01-20 ENCOUNTER — Other Ambulatory Visit: Payer: Self-pay | Admitting: Orthopaedic Surgery

## 2021-01-23 ENCOUNTER — Other Ambulatory Visit: Payer: Self-pay

## 2021-01-23 ENCOUNTER — Encounter: Payer: Self-pay | Admitting: Orthopaedic Surgery

## 2021-01-23 ENCOUNTER — Ambulatory Visit (INDEPENDENT_AMBULATORY_CARE_PROVIDER_SITE_OTHER): Payer: 59 | Admitting: Orthopaedic Surgery

## 2021-01-23 DIAGNOSIS — G5622 Lesion of ulnar nerve, left upper limb: Secondary | ICD-10-CM | POA: Diagnosis not present

## 2021-01-23 DIAGNOSIS — G5601 Carpal tunnel syndrome, right upper limb: Secondary | ICD-10-CM | POA: Diagnosis not present

## 2021-01-23 NOTE — Progress Notes (Signed)
Office Visit Note   Patient: Meredith Byrd           Date of Birth: [birth year]           MRN: [id number] Visit Date: 01/23/2021              Requested by: Irena Reichmann, DO [address] [place],  [place] [address] PCP: Irena Reichmann, DO   Assessment & Plan: Visit Diagnoses:  1. Right carpal tunnel syndrome   2. Cubital tunnel syndrome, left     Plan: Based on recent EMG she has right moderate carpal tunnel syndrome.  Concern is that she may have left cubital tunnel syndrome as well.  She she is requesting nerve conduction study to evaluate for this.  We will have her follow-up afterwards.  Follow-Up Instructions: Return if symptoms worsen or fail to improve.   Orders:  Orders Placed This Encounter  Procedures  . Ambulatory referral to Physical Medicine Rehab   No orders of the defined types were placed in this encounter.     Procedures: No procedures performed   Clinical Data: No additional findings.   Subjective: Chief Complaint  Patient presents with  . Right Hand - Pain  . Left Hand - Pain    Patient returns today for review of recent EMGs for carpal tunnel syndrome of the right hand.  She was found to have moderate carpal tunnel syndrome.  She is also complaining of numbness and tingling in the ring and small finger of the left hand.   Review of Systems  Constitutional: Negative.   HENT: Negative.   Eyes: Negative.   Respiratory: Negative.   Cardiovascular: Negative.   Endocrine: Negative.   Musculoskeletal: Negative.   Neurological: Negative.   Hematological: Negative.   Psychiatric/Behavioral: Negative.   All other systems reviewed and are negative.    Objective: Vital Signs: There were no vitals taken for this visit.  Physical Exam Vitals and nursing note reviewed.  Constitutional:      Appearance: She is well-developed.  Pulmonary:     Effort: Pulmonary effort is normal.  Skin:    General: Skin is warm.      Capillary Refill: Capillary refill takes less than 2 seconds.  Neurological:     Mental Status: She is alert and oriented to person, place, and time.  Psychiatric:        Behavior: Behavior normal.        Thought Content: Thought content normal.        Judgment: Judgment normal.     Ortho Exam Right hand exam is unchanged. Left hand shows subjective decrease sensation to the small and ring finger.  Ulnar nerve is stable at the elbow without any subluxation.  Negative Tinel. Specialty Comments:  No specialty comments available.  Imaging: No results found.   PMFS History: Patient Active Problem List   Diagnosis Date Noted  . Right carpal tunnel syndrome 01/23/2021  . No-show for appointment 11/25/2020  . 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 On Depo-Provera for contraception 05/31/2019  . Chronic insomnia 05/17/2019  . Lumbar facet arthropathy 12/14/2018  . Dysmenorrhea, unspecified 05/16/2018  . Knee pain, left 11/17/2017  . Generalized anxiety disorder 10/04/2017  . Morning headache 05/04/2017  . Daytime somnolence 05/04/2017  . Class 3 obesity with body mass index (BMI) of 45.0 to 49.9 in adult 03/28/2017  . Chronic low back pain 01/19/2017  . Vitamin D deficiency 01/18/2017  . Vitamin B12 deficiency 03/17/2016  .  Beta thalassemia trait 03/25/2015  . Intractable migraine without aura and without status migrainosus 11/23/2014  . Elevated sed rate 08/20/2014  . Arthritis pain 08/20/2014  . Iron deficiency anemia 07/09/2014  . Symptomatic anemia 06/05/2014  . Chest pain 05/01/2014  . GERD (gastroesophageal reflux disease) 03/22/2014  . S/P gastric bypass 03/22/2014  . Insomnia 03/22/2014  . Migraine 03/22/2014  . Palpitations 03/22/2014   Past Medical History:  Diagnosis Date  . Allergy   . Anemia   . Anxiety   . Arthritis pain 08/20/2014  . Back pain   . COVID-19   . GERD (gastroesophageal reflux disease)   . History of stress test 02/2011 ([facility])    there was no evidence of ischemia, but she only went 3 1/2 minutes on the treadmill making it very difficult to get a good accurate assessment, however  . Hx of echocardiogram    The echocadiogram was essentially normal with the exception of mild mitral calcification and borderline concentric LVH, which in the setting of her hypertension at this early age is something that mean her blood pressure is well controlled.   . Hypertension   . Iron deficiency anemia, unspecified 07/09/2014  . Migraines   . Palpitations   . Swelling   . Vitamin B12 deficiency 03/17/2016    Family History  Problem Relation Age of Onset  . Stroke Maternal Grandmother   . Heart disease Maternal Grandmother   . Hypertension Maternal Grandmother   . Hyperlipidemia Maternal Grandmother   . Breast cancer Maternal Grandmother   . Asthma Mother   . Heart disease Mother   . Diabetes Mother   . Early death Mother   . Hypertension Mother   . Thyroid disease Mother   . Anxiety disorder Mother   . Hypertension Father   . Heart disease Father   . Breast cancer Maternal Aunt        under 65  . Breast cancer Paternal Aunt     Past Surgical History:  Procedure Laterality Date  . BREAST SURGERY    . CHOLECYSTECTOMY    . GASTRIC BYPASS  2008  . HIGH RISK BREAST EXCISION Bilateral   . OOPHORECTOMY Right 2010   Social History   Occupational History  . Occupation: Human resources officer  Tobacco Use  . Smoking status: Never Smoker  . Smokeless tobacco: Never Used  Vaping Use  . Vaping Use: Never used  Substance and Sexual Activity  . Alcohol use: No  . Drug use: No  . Sexual activity: Yes

## 2021-01-24 ENCOUNTER — Ambulatory Visit: Payer: 59

## 2021-01-25 ENCOUNTER — Telehealth: Payer: Self-pay

## 2021-01-25 ENCOUNTER — Inpatient Hospital Stay: Payer: 59 | Attending: Hematology and Oncology

## 2021-01-25 DIAGNOSIS — Z79899 Other long term (current) drug therapy: Secondary | ICD-10-CM | POA: Insufficient documentation

## 2021-01-25 DIAGNOSIS — E538 Deficiency of other specified B group vitamins: Secondary | ICD-10-CM | POA: Insufficient documentation

## 2021-01-25 DIAGNOSIS — D509 Iron deficiency anemia, unspecified: Secondary | ICD-10-CM | POA: Insufficient documentation

## 2021-01-25 NOTE — Telephone Encounter (Signed)
Patient called at 11:40 regarding their 11:00 injection appointment. Patient stated that she had forgotten and would be in shortly. Patient still not arrived to infusion suite. Patient marked as "no show".

## 2021-01-27 ENCOUNTER — Telehealth: Payer: Self-pay | Admitting: Hematology and Oncology

## 2021-01-27 NOTE — Telephone Encounter (Signed)
R/s appt per 4/16 sch msg. Pt aware.

## 2021-01-28 ENCOUNTER — Telehealth: Payer: Self-pay | Admitting: Physical Medicine and Rehabilitation

## 2021-01-28 NOTE — Telephone Encounter (Signed)
Patient called requesting a call back concerning setting nerve study testing for other hand. Pt phone number is [phone].

## 2021-01-29 ENCOUNTER — Telehealth: Payer: Self-pay

## 2021-01-29 NOTE — Telephone Encounter (Signed)
Patient called regarding scheduling a appointment she is requesting a call back:[phone]

## 2021-01-29 NOTE — Telephone Encounter (Signed)
Pt called and sch 5/27

## 2021-01-29 NOTE — Telephone Encounter (Signed)
Pt called and sch 5/27 

## 2021-02-01 ENCOUNTER — Inpatient Hospital Stay: Payer: 59

## 2021-02-01 ENCOUNTER — Other Ambulatory Visit: Payer: Self-pay

## 2021-02-01 VITALS — BP 123/70 | HR 92 | Temp 99.6°F | Resp 18

## 2021-02-01 DIAGNOSIS — Z79899 Other long term (current) drug therapy: Secondary | ICD-10-CM | POA: Diagnosis not present

## 2021-02-01 DIAGNOSIS — E538 Deficiency of other specified B group vitamins: Secondary | ICD-10-CM | POA: Diagnosis not present

## 2021-02-01 DIAGNOSIS — D509 Iron deficiency anemia, unspecified: Secondary | ICD-10-CM | POA: Diagnosis present

## 2021-02-01 MED ORDER — CYANOCOBALAMIN 1000 MCG/ML IJ SOLN
1000.0000 ug | Freq: Once | INTRAMUSCULAR | Status: AC
  Administered 2021-02-01: 1000 ug via INTRAMUSCULAR

## 2021-02-01 NOTE — Patient Instructions (Signed)

## 2021-02-04 ENCOUNTER — Telehealth: Payer: Self-pay | Admitting: *Deleted

## 2021-02-04 NOTE — Telephone Encounter (Signed)
PA for Emgality 120mg  started on covermymeds (key: [id number]). Pt has coverage through [employer] [phone]). Decision pending.

## 2021-02-06 NOTE — Telephone Encounter (Signed)
We have received two additional questionnaires for this PA. All questions have been answered and faxed back. I called [employer] at [phone]. The rep informed me the decision is still pending.

## 2021-02-07 ENCOUNTER — Ambulatory Visit: Payer: 59 | Admitting: Orthopaedic Surgery

## 2021-02-07 ENCOUNTER — Encounter: Payer: 59 | Admitting: Physical Medicine and Rehabilitation

## 2021-02-10 NOTE — Telephone Encounter (Signed)
PA approved through 02/06/2022.

## 2021-02-19 ENCOUNTER — Other Ambulatory Visit: Payer: Self-pay | Admitting: Family Medicine

## 2021-02-21 ENCOUNTER — Ambulatory Visit: Payer: 59

## 2021-02-22 ENCOUNTER — Inpatient Hospital Stay: Payer: 59 | Attending: Hematology and Oncology

## 2021-02-27 ENCOUNTER — Other Ambulatory Visit: Payer: Self-pay | Admitting: Family Medicine

## 2021-03-07 ENCOUNTER — Ambulatory Visit (INDEPENDENT_AMBULATORY_CARE_PROVIDER_SITE_OTHER): Payer: 59 | Admitting: Orthopaedic Surgery

## 2021-03-07 ENCOUNTER — Encounter: Payer: 59 | Admitting: Physical Medicine and Rehabilitation

## 2021-03-07 ENCOUNTER — Encounter: Payer: Self-pay | Admitting: Orthopaedic Surgery

## 2021-03-07 ENCOUNTER — Other Ambulatory Visit: Payer: Self-pay

## 2021-03-07 VITALS — Ht <= 58 in | Wt 158.0 lb

## 2021-03-07 DIAGNOSIS — G5601 Carpal tunnel syndrome, right upper limb: Secondary | ICD-10-CM

## 2021-03-07 DIAGNOSIS — G5622 Lesion of ulnar nerve, left upper limb: Secondary | ICD-10-CM

## 2021-03-07 NOTE — Progress Notes (Signed)
Office Visit Note   Patient: Meredith Byrd           Date of Birth: Jul 13, 1981           MRN: [id number] Visit Date: 03/07/2021              Requested by: Janie Morning, DO [address] [place],  [place] [address] PCP: Janie Morning, DO   Assessment & Plan: Visit Diagnoses:  1. Right carpal tunnel syndrome   2. Cubital tunnel syndrome, left     Plan: For the right hand nerve conduction studies consistent with moderate carpal tunnel syndrome.  Results were reviewed with the patient and given failure of conservative treatments she is elected to proceed with a right carpal tunnel syndrome for sometime in September.  She met with Jackelyn Poling today.  For the left arm she has a clinical diagnosis of cubital tunnel syndrome.  Based on treatment options she would like to try an elbow extension brace for now.  She has had bad experiences with cortisone injections in the past therefore she declined these today.  We look forward to treating her in the operating room for right carpal tunnel syndrome.  Risk benefits rehab recovery time reviewed with the patient  Follow-Up Instructions: No follow-ups on file.   Orders:  No orders of the defined types were placed in this encounter.  No orders of the defined types were placed in this encounter.     Procedures: No procedures performed   Clinical Data: No additional findings.   Subjective: Chief Complaint  Patient presents with  . Right Hand - Pain, Numbness  . Left Hand - Pain, Numbness    Patient returns today for follow-up of recent nerve conduction studies which found moderate right carpal tunnel syndrome.  Nerve conduction studies for normal in the left arm.  Denies any changes in her symptoms.   Review of Systems   Objective: Vital Signs: Ht $RemoveB'4\' 10"'gfQeyymS$  (1.473 m)   Wt 158 lb (71.7 kg)   BMI 33.02 kg/m   Physical Exam  Ortho Exam Right hand exam is unchanged. Left elbow shows normal range of motion.  Ulnar  nerve is stable.  Positive Tinel.  No muscle atrophy of the left hand.  Subjective dysesthesias in the ring and small finger. Specialty Comments:  No specialty comments available.  Imaging: No results found.   PMFS History: Patient Active Problem List   Diagnosis Date Noted  . Right carpal tunnel syndrome 01/23/2021  . No-show for appointment 11/25/2020  . 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 On Depo-Provera for contraception 05/31/2019  . Chronic insomnia 05/17/2019  . Lumbar facet arthropathy 12/14/2018  . Dysmenorrhea, unspecified 05/16/2018  . Knee pain, left 11/17/2017  . Generalized anxiety disorder 10/04/2017  . Morning headache 05/04/2017  . Daytime somnolence 05/04/2017  . Class 3 obesity with body mass index (BMI) of 45.0 to 49.9 in adult 03/28/2017  . Chronic low back pain 01/19/2017  . Vitamin D deficiency 01/18/2017  . Vitamin B12 deficiency 03/17/2016  . Beta thalassemia trait 03/25/2015  . Intractable migraine without aura and without status migrainosus 11/23/2014  . Elevated sed rate 08/20/2014  . Arthritis pain 08/20/2014  . Iron deficiency anemia 07/09/2014  . Symptomatic anemia 06/05/2014  . Chest pain 05/01/2014  . GERD (gastroesophageal reflux disease) 03/22/2014  . S/P gastric bypass 03/22/2014  . Insomnia 03/22/2014  . Migraine 03/22/2014  . Palpitations 03/22/2014   Past Medical History:  Diagnosis Date  .  Allergy   . Anemia   . Anxiety   . Arthritis pain 08/20/2014  . Back pain   . COVID-19   . GERD (gastroesophageal reflux disease)   . History of stress test 02/2011 ([facility])   there was no evidence of ischemia, but she only went 3 1/2 minutes on the treadmill making it very difficult to get a good accurate assessment, however  . Hx of echocardiogram    The echocadiogram was essentially normal with the exception of mild mitral calcification and borderline concentric LVH, which in the setting of her hypertension at this early age is  something that mean her blood pressure is well controlled.   . Hypertension   . Iron deficiency anemia, unspecified 07/09/2014  . Migraines   . Palpitations   . Swelling   . Vitamin B12 deficiency 03/17/2016    Family History  Problem Relation Age of Onset  . Stroke Maternal Grandmother   . Heart disease Maternal Grandmother   . Hypertension Maternal Grandmother   . Hyperlipidemia Maternal Grandmother   . Breast cancer Maternal Grandmother   . Asthma Mother   . Heart disease Mother   . Diabetes Mother   . Early death Mother   . Hypertension Mother   . Thyroid disease Mother   . Anxiety disorder Mother   . Hypertension Father   . Heart disease Father   . Breast cancer Maternal Aunt        under 46  . Breast cancer Paternal Aunt     Past Surgical History:  Procedure Laterality Date  . BREAST SURGERY    . CHOLECYSTECTOMY    . GASTRIC BYPASS  2008  . HIGH RISK BREAST EXCISION Bilateral   . OOPHORECTOMY Right 2010   Social History   Occupational History  . Occupation: Diplomatic Services operational officer  Tobacco Use  . Smoking status: Never Smoker  . Smokeless tobacco: Never Used  Vaping Use  . Vaping Use: Never used  Substance and Sexual Activity  . Alcohol use: No  . Drug use: No  . Sexual activity: Yes

## 2021-03-13 ENCOUNTER — Telehealth: Payer: Self-pay | Admitting: Hematology and Oncology

## 2021-03-13 NOTE — Telephone Encounter (Signed)
Called pt to r/s appt per 6/1 sch msg. No answer. Left msg for pt to call back to r/s appt.

## 2021-03-21 ENCOUNTER — Other Ambulatory Visit: Payer: Self-pay

## 2021-03-21 ENCOUNTER — Inpatient Hospital Stay: Payer: 59 | Attending: Hematology and Oncology

## 2021-03-21 ENCOUNTER — Inpatient Hospital Stay (HOSPITAL_BASED_OUTPATIENT_CLINIC_OR_DEPARTMENT_OTHER): Payer: 59 | Admitting: Hematology and Oncology

## 2021-03-21 ENCOUNTER — Other Ambulatory Visit: Payer: Self-pay | Admitting: Hematology and Oncology

## 2021-03-21 ENCOUNTER — Ambulatory Visit: Payer: 59

## 2021-03-21 VITALS — BP 101/76 | HR 74 | Temp 97.8°F | Resp 18 | Ht <= 58 in | Wt 165.1 lb

## 2021-03-21 DIAGNOSIS — E538 Deficiency of other specified B group vitamins: Secondary | ICD-10-CM

## 2021-03-21 DIAGNOSIS — D509 Iron deficiency anemia, unspecified: Secondary | ICD-10-CM | POA: Diagnosis not present

## 2021-03-21 DIAGNOSIS — Z9884 Bariatric surgery status: Secondary | ICD-10-CM | POA: Insufficient documentation

## 2021-03-21 DIAGNOSIS — D563 Thalassemia minor: Secondary | ICD-10-CM | POA: Diagnosis not present

## 2021-03-21 DIAGNOSIS — D508 Other iron deficiency anemias: Secondary | ICD-10-CM

## 2021-03-21 DIAGNOSIS — Z79899 Other long term (current) drug therapy: Secondary | ICD-10-CM | POA: Insufficient documentation

## 2021-03-21 LAB — IRON AND TIBC
Iron: 110 ug/dL (ref 41–142)
Saturation Ratios: 32 % (ref 21–57)
TIBC: 340 ug/dL (ref 236–444)
UIBC: 230 ug/dL (ref 120–384)

## 2021-03-21 LAB — CBC WITH DIFFERENTIAL (CANCER CENTER ONLY)
Abs Immature Granulocytes: 0.01 10*3/uL (ref 0.00–0.07)
Basophils Absolute: 0 10*3/uL (ref 0.0–0.1)
Basophils Relative: 0 %
Eosinophils Absolute: 0.1 10*3/uL (ref 0.0–0.5)
Eosinophils Relative: 1 %
HCT: 35.4 % — ABNORMAL LOW (ref 36.0–46.0)
Hemoglobin: 11.4 g/dL — ABNORMAL LOW (ref 12.0–15.0)
Immature Granulocytes: 0 %
Lymphocytes Relative: 34 %
Lymphs Abs: 1.3 10*3/uL (ref 0.7–4.0)
MCH: 25.5 pg — ABNORMAL LOW (ref 26.0–34.0)
MCHC: 32.2 g/dL (ref 30.0–36.0)
MCV: 79.2 fL — ABNORMAL LOW (ref 80.0–100.0)
Monocytes Absolute: 0.2 10*3/uL (ref 0.1–1.0)
Monocytes Relative: 5 %
Neutro Abs: 2.3 10*3/uL (ref 1.7–7.7)
Neutrophils Relative %: 60 %
Platelet Count: 371 10*3/uL (ref 150–400)
RBC: 4.47 MIL/uL (ref 3.87–5.11)
RDW: 15.8 % — ABNORMAL HIGH (ref 11.5–15.5)
WBC Count: 3.8 10*3/uL — ABNORMAL LOW (ref 4.0–10.5)
nRBC: 0 % (ref 0.0–0.2)

## 2021-03-21 LAB — CMP (CANCER CENTER ONLY)
ALT: 22 U/L (ref 0–44)
AST: 14 U/L — ABNORMAL LOW (ref 15–41)
Albumin: 3.7 g/dL (ref 3.5–5.0)
Alkaline Phosphatase: 52 U/L (ref 38–126)
Anion gap: 9 (ref 5–15)
BUN: 6 mg/dL (ref 6–20)
CO2: 25 mmol/L (ref 22–32)
Calcium: 9.2 mg/dL (ref 8.9–10.3)
Chloride: 107 mmol/L (ref 98–111)
Creatinine: 0.73 mg/dL (ref 0.44–1.00)
GFR, Estimated: >60 mL/min (ref >60)
Glucose, Bld: 98 mg/dL (ref 70–99)
Potassium: 4 mmol/L (ref 3.5–5.1)
Sodium: 141 mmol/L (ref 135–145)
Total Bilirubin: 0.6 mg/dL (ref 0.3–1.2)
Total Protein: 7 g/dL (ref 6.5–8.1)

## 2021-03-21 LAB — RETIC PANEL
Immature Retic Fract: 15.4 % (ref 2.3–15.9)
RBC.: 4.38 MIL/uL (ref 3.87–5.11)
Retic Count, Absolute: 49.1 10*3/uL (ref 19.0–186.0)
Retic Ct Pct: 1.1 % (ref 0.4–3.1)
Reticulocyte Hemoglobin: 27.2 pg — ABNORMAL LOW (ref >27.9)

## 2021-03-21 LAB — FERRITIN: Ferritin: 149 ng/mL (ref 11–307)

## 2021-03-21 LAB — VITAMIN B12: Vitamin B-12: 173 pg/mL — ABNORMAL LOW (ref 180–914)

## 2021-03-21 LAB — FOLATE: Folate: 7.7 ng/mL (ref >5.9)

## 2021-03-21 NOTE — Progress Notes (Signed)
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 Leonides Schanz  12/20-12/27/2021: IV feraheme 510mg  q 7 days x 2 doses and 2 doses of IM Vitamin b12 .  12/26/2020: WBC 4.3, Hgb 11.8, MCV 79.4, Plt 432  Interval History:  Meredith Byrd 40 y.o. female with medical history significant for iron deficiency anemia 2/2 to gastric bipass surgery who presents for a follow up visit. The patient's last visit was on 12/27/2020. In the interim since the last visit she has continued monthly IM vitamin b12.   On exam today Meredith Byrd reports she is continue taking the vitamin B12 shots every month since her last visit.  She notes that she has tolerated these well, but unfortunately there has been no improvement in her symptoms.  She does remain fatigued but does report feeling a "boost in energy" about the day after the shot.  She notes that she feels like she has good energy about 2 to 3 days out of every month.  She is been doing her best to try to eat more salads and veggies.  She otherwise denies any fevers, sweats, nausea, vomiting or diarrhea.  She is had no other changes in her health at this time.  A full 10 point ROS is listed below.  MEDICAL HISTORY:  Past Medical History:  Diagnosis Date   Allergy    Anemia    Anxiety    Arthritis pain 08/20/2014   Back pain    COVID-19    GERD (gastroesophageal reflux disease)     History of stress test 02/2011 ([facility])   there was no evidence of ischemia, but she only went 3 1/2 minutes on the treadmill making it very difficult to get a good accurate assessment, however   Hx of echocardiogram    The echocadiogram was essentially normal with the exception of mild mitral calcification and borderline concentric LVH, which in the setting of her hypertension at this early age is something that mean her blood pressure is well controlled.    Hypertension    Iron deficiency anemia, unspecified 07/09/2014   Migraines    Palpitations    Swelling    Vitamin B12 deficiency 03/17/2016    SURGICAL HISTORY: Past Surgical History:  Procedure Laterality Date   BREAST SURGERY     CHOLECYSTECTOMY     GASTRIC BYPASS  2008   HIGH RISK BREAST EXCISION Bilateral    OOPHORECTOMY Right 2010    SOCIAL HISTORY: Social History   Socioeconomic History   Marital status: Married    Spouse name: Damon   Number of children: 4   Years of education: Associates   Highest education level: Not on file  Occupational History   Occupation: Insurance Claims  Tobacco Use   Smoking status: Never   Smokeless tobacco: Never  Vaping Use   Vaping Use: Never used  Substance and Sexual Activity   Alcohol use: No   Drug use: No   Sexual activity: Yes  Other Topics Concern  Not on file  Social History Narrative   Right handed.   Lives at home with husband and four children.   No caffeine use.   Social Determinants of Health   Financial Resource Strain: Not on file  Food Insecurity: Not on file  Transportation Needs: Not on file  Physical Activity: Not on file  Stress: Not on file  Social Connections: Not on file  Intimate Partner Violence: Not on file    FAMILY HISTORY: Family History  Problem Relation Age of Onset   Stroke Maternal Grandmother    Heart disease Maternal Grandmother    Hypertension Maternal Grandmother    Hyperlipidemia Maternal Grandmother    Breast cancer Maternal  Grandmother    Asthma Mother    Heart disease Mother    Diabetes Mother    Early death Mother    Hypertension Mother    Thyroid disease Mother    Anxiety disorder Mother    Hypertension Father    Heart disease Father    Breast cancer Maternal Aunt        under 1665   Breast cancer Paternal Aunt     ALLERGIES:  is allergic to doxycycline, fioricet [butalbital-apap-caffeine], inderal [propranolol], and nsaids.  MEDICATIONS:  Current Outpatient Medications  Medication Sig Dispense Refill   acetaminophen (TYLENOL) 500 MG tablet Take 2 tablets (1,000 mg total) by mouth every 6 (six) hours as needed. 30 tablet 0   albuterol (VENTOLIN HFA) 108 (90 Base) MCG/ACT inhaler Inhale 1-2 puffs into the lungs every 6 (six) hours as needed for wheezing or shortness of breath. 18 g 0   clonazePAM (KLONOPIN) 2 MG tablet Take 2 mg by mouth daily as needed.     cloNIDine (CATAPRES) 0.2 MG tablet Take 0.4 mg by mouth at bedtime.     diazepam (VALIUM) 5 MG tablet Take 1 by mouth 1 hour  pre-procedure with very light food. May bring 2nd tablet to appointment. 2 tablet 0   diclofenac Sodium (VOLTAREN) 1 % GEL APPLY 4 GRAMS TOPICALLY 4 TIMES DAILY 100 g 1   Docusate Sodium (DSS) 100 MG CAPS docusate sodium 100 mg capsule  TAKE 1 CAPSULE BY MOUTH TWICE A DAY     folic acid (FOLVITE) 1 MG tablet Take 1 tablet (1 mg total) by mouth daily. 90 tablet 1   Galcanezumab-gnlm (EMGALITY) 120 MG/ML SOAJ Inject 120 mg as directed every 30 (thirty) days. 1 mL 11   hydrocortisone (ANUSOL-HC) 2.5 % rectal cream Place 1 application rectally 2 (two) times daily. 60 g 0   medroxyPROGESTERone (DEPO-PROVERA) 150 MG/ML injection      ondansetron (ZOFRAN ODT) 4 MG disintegrating tablet Take 1 tablet (4 mg total) by mouth every 4 (four) hours as needed for nausea or vomiting. 20 tablet 0   tiZANidine (ZANAFLEX) 4 MG tablet TAKE 1 TABLET BY MOUTH TWICE A DAY AS NEEDED FOR MUSCLE SPASMS 180 tablet 0   traZODone (DESYREL) 100 MG tablet       Ubrogepant (UBRELVY) 50 MG TABS Take 1 tab at onset of migraine.  May repeat in 2 hrs, if needed.  Max dose: 2 tabs/day. This is a 30 day prescription. 12 tablet 11   Current Facility-Administered Medications  Medication Dose Route Frequency Provider Last Rate Last Admin   diclofenac Sodium (VOLTAREN) 1 % topical gel 4 g  4 g Topical QID Eldred MangesYates, Mark C, MD        REVIEW OF SYSTEMS:   Constitutional: ( - ) fevers, ( - )  chills , ( - ) night sweats Eyes: ( - ) blurriness of vision, ( - ) double vision, ( - ) watery eyes Ears, nose, mouth, throat, and face: ( - ) mucositis, ( - ) sore throat Respiratory: ( - ) cough, ( - ) dyspnea, ( - ) wheezes Cardiovascular: ( - ) palpitation, ( - ) chest discomfort, ( - ) lower extremity swelling Gastrointestinal:  ( - ) nausea, ( - ) heartburn, ( - ) change in bowel habits Skin: ( - ) abnormal skin rashes Lymphatics: ( - ) new lymphadenopathy, ( - ) easy bruising Neurological: ( - ) numbness, ( - ) tingling, ( - ) new weaknesses Behavioral/Psych: ( - ) mood change, ( - ) new changes  All other systems were reviewed with the patient and are negative.  PHYSICAL EXAMINATION:  Vitals:   03/21/21 0817  BP: 101/76  Pulse: 74  Resp: 18  Temp: 97.8 F (36.6 C)  SpO2: 100%    Filed Weights   03/21/21 0817  Weight: 165 lb 1.6 oz (74.9 kg)     GENERAL: tired appearing middle aged [place] American female. alert, no distress and comfortable SKIN: skin color, texture, turgor are normal, no rashes or significant lesions EYES: conjunctiva are pink and non-injected, sclera clear LUNGS: clear to auscultation and percussion with normal breathing effort HEART: regular rate & rhythm and no murmurs and no lower extremity edema Musculoskeletal: no cyanosis of digits and no clubbing  PSYCH: alert & oriented x 3, fluent speech NEURO: no focal motor/sensory deficits  LABORATORY DATA:  I have reviewed the data as listed CBC Latest Ref Rng & Units  03/21/2021 12/26/2020 11/18/2020  WBC 4.0 - 10.5 K/uL 3.8(L) 4.3 4.6  Hemoglobin 12.0 - 15.0 g/dL 11.4(L) 11.8(L) 10.6(L)  Hematocrit 36.0 - 46.0 % 35.4(L) 37.4 34.5(L)  Platelets 150 - 400 K/uL 371 432(H) 417(H)    CMP Latest Ref Rng & Units 03/21/2021 12/26/2020 11/18/2020  Glucose 70 - 99 mg/dL 98 92 87  BUN 6 - 20 mg/dL 6 5(L) 9  Creatinine 9.92 - 1.00 mg/dL 4.26 8.34 1.96  Sodium 135 - 145 mmol/L 141 136 140  Potassium 3.5 - 5.1 mmol/L 4.0 3.4(L) 4.6  Chloride 98 - 111 mmol/L 107 105 107  CO2 22 - 32 mmol/L 25 24 24   Calcium 8.9 - 10.3 mg/dL 9.2 9.3 )  Total Protein 6.5 - 8.1 g/dL 7.0 8.1 7.1  Total Bilirubin 0.3 - 1.2 mg/dL 0.6 0.4 0.3  Alkaline Phos 38 - 126 U/L 52 58 54  AST 15 - 41 U/L 14(L) 17 21  ALT 0 - 44 U/L 22 23 22     RADIOGRAPHIC STUDIES: I have personally reviewed the radiological images as listed and agreed with the findings in the report. No results found.  ASSESSMENT & PLAN Meredith Byrd 40 y.o. female with medical history significant for iron deficiency anemia 2/2 to gastric bipass surgery who presents for a follow up visit.  After review the labs, the records, schedule the patient the findings most consistent with persistent anemia in the setting of known iron deficiency anemia and vitamin B12 deficiency due to gastric bypass surgery.  The patient received 2 doses of IV iron with weekly IM vitamin B12 x 1 month and did not have any improvement in her hemoglobin or her symptoms.  Today we will recheck her iron levels and vitamin B12 levels in order to assure that these were effective in bolstering her levels.  Given her  symptoms today and the decrease in hemoglobin I am concerned that she may have greater deficits than anticipated and may require additional doses.  We will plan to have the patient return to clinic ending the results of her studies today.  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  There may also be a component of beta thalassemia trait causing her lab findings. --Today we will recheck CMP, CBC, iron panel, and ferritin. --If patient is found to have continued low levels of iron would recommend proceeding with Feraheme 510 mg IV q. 7 days x 2 doses --Blood levels were found to be low today recommend folic acid 1 mcg p.o. daily, called this into her pharmacy. --Return to clinic in 6 months with interval monthly Vitamin b12 IM injections.   #Fatigue, stable --Patient still has marked fatigue despite adequate repletion of iron and vitamin B12.  She is low in folate but that should not cause low energy. --normal TSH and hemoglobin A1c at last check --If these do do not reveal the etiology I am uncertain what the cause is, potentially psychiatric in nature. Would recommend continued follow-up with her PCP.   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   No orders of the defined types were placed in this encounter.   All questions were answered. The patient knows to call the clinic with any problems, questions or concerns.  A total of more than 30 minutes were spent on this encounter and over half of that time was spent on counseling and coordination of care as outlined above.   Ulysees Barns, MD Department of Hematology/Oncology [facility] at [facility] Phone: [phone] Pager: [phone] Email: [email or web address]  03/21/2021 9:03 AM

## 2021-03-22 ENCOUNTER — Inpatient Hospital Stay: Payer: 59

## 2021-03-22 ENCOUNTER — Other Ambulatory Visit: Payer: Self-pay

## 2021-03-22 VITALS — BP 91/69 | HR 71 | Temp 97.2°F | Resp 18

## 2021-03-22 DIAGNOSIS — E538 Deficiency of other specified B group vitamins: Secondary | ICD-10-CM

## 2021-03-22 DIAGNOSIS — D508 Other iron deficiency anemias: Secondary | ICD-10-CM

## 2021-03-22 DIAGNOSIS — D509 Iron deficiency anemia, unspecified: Secondary | ICD-10-CM | POA: Diagnosis not present

## 2021-03-22 MED ORDER — CYANOCOBALAMIN 1000 MCG/ML IJ SOLN
INTRAMUSCULAR | Status: AC
  Filled 2021-03-22: qty 1

## 2021-03-22 MED ORDER — CYANOCOBALAMIN 1000 MCG/ML IJ SOLN
1000.0000 ug | Freq: Once | INTRAMUSCULAR | Status: AC
  Administered 2021-03-22: 1000 ug via INTRAMUSCULAR

## 2021-03-22 NOTE — Patient Instructions (Signed)
Cyanocobalamin, Vitamin B12 injection O que  este medicamento? A CIANOCOBALAMINA  uma forma sinttica de vitamina B12. A vitamina B12  essencial para o desenvolvimento de glbulos sanguneos, clulas nervosas e protenas saudveis pelo organismo. Tambm ajuda no metabolismo das gorduras e carboidratos. Este medicamento  usado para tratar pessoas que no conseguem absorver vitamina B12 suficiente. Este medicamento pode ser usado para outros propsitos; em caso de dvidas, pergunte ao seu profissional de sade ou farmacutico. NOMES DE MARCAS COMUNS: B-12 Compliance Kit, B-12 Injection Kit, Cyomin, LA-12, Nutri-Twelve, Physicians EZ Use B-12, Primabalt O que devo dizer a meu profissional de sade antes de tomar este medicamento? Precisam saber se voc tem algum dos seguintes problemas ou estados de sade: doenas renais sndrome ou doena de Leber anemia megaloblstica reao estranha ou alergia  cianocobalamina ou ao cobalto reao estranha ou alergia a outros medicamentos, alimentos, corantes ou conservantes est grvida ou tentando engravidar est amamentando Como devo usar este medicamento? Este medicamento  injetado por via intramuscular ou por injeo subcutnea profunda. Costuma ser administrado por um profissional de sade em consultrio ou Chief of Staff. Porm,  possvel que seu mdico lhe ensine como aplicar suas prprias injees. Siga todas as instrues. Fale com seu pediatra a respeito do uso deste medicamento em crianas. Pode ser preciso tomar alguns cuidados especiais. Superdosagem: Se achar que tomou uma superdosagem deste medicamento, entre em contato imediatamente com o [facility] ou v a [employer]. OBSERVAO: Este medicamento  s para voc. No compartilhe este medicamento com outras pessoas. E se eu deixar de tomar uma dose? Se toma o medicamento em uma clnica ou no consultrio do seu mdico, ligue para Paramedic a Passenger transport manager. Se aplica as  injees por conta prpria e perdeu uma dose, tome-a assim que possvel. Se j estiver quase na hora da sua prxima dose, tome somente essa dose. No tome o remdio em dobro, nem tome uma dose adicional. O que pode interagir com este medicamento? colchicina consumo pesado de lcool Esta lista pode no descrever todas as interaes possveis. D ao seu profissional de sade uma lista de todos os medicamentos, ervas medicinais, remdios de venda livre, ou suplementos alimentares que voc [place]. Diga tambm se voc fuma, bebe, ou [place] drogas ilcitas. Alguns destes podem interagir com o seu medicamento. Ao que devo ficar atento quando estiver [employer] medicamento? Consulte seu mdico ou profissional de sade para acompanhamento regular Museum/gallery curator. Voc precisar fazer exames de sangue peridicos enquanto estiver [employer]. Voc pode precisar seguir uma dieta especial. Fale com seu mdico. Para conseguir o mximo benefcio deste medicamento, limite o seu consumo de lcool e evite fumar. Que efeitos colaterais posso sentir aps usar este medicamento? Efeitos colaterais que devem ser informados ao seu mdico ou profissional de sade o mais rpido possvel: reaes alrgicas, como erupo na pele, coceira, urticria, ou inchao do rosto, dos lbios ou da lngua pele azulada dor ou aperto no peito chiado no peito ou dificuldade para respirar tontura rea vermelha, inchada e dolorosa na perna Efeitos colaterais que normalmente no precisam de cuidados mdicos (avise ao seu mdico ou profissional de sade se persistirem ou forem incmodos): diarreia dor de cabea Esta lista pode no descrever todos os efeitos colaterais possveis. Para mais orientaes sobre efeitos colaterais, consulte o seu mdico. Voc pode relatar a ocorrncia de efeitos colaterais  FDA pelo telefone [phone]. Onde devo guardar meu medicamento? [facility] fora do [employer]. Conservar em [employer], entre 15 e 30 degreesC (  59 e 86 degreesF). Proteger Administrator, arts. Descartar qualquer medicamento no utilizado aps a data de validade impressa no rtulo ou embalagem. OBSERVAO: Este folheto  um resumo. Pode no cobrir todas as informaes possveis. Se tiver dvidas a respeito deste medicamento, fale com seu mdico, farmacutico ou profissional de sade.  2021 Elsevier/Gold Standard (2010-07-02 00:00:00)

## 2021-03-27 LAB — METHYLMALONIC ACID, SERUM: Methylmalonic Acid, Quantitative: 153 nmol/L (ref 0–378)

## 2021-05-03 ENCOUNTER — Other Ambulatory Visit: Payer: Self-pay

## 2021-05-03 ENCOUNTER — Inpatient Hospital Stay: Payer: 59 | Attending: Hematology and Oncology

## 2021-05-03 VITALS — BP 128/97 | HR 79 | Temp 99.1°F

## 2021-05-03 DIAGNOSIS — D508 Other iron deficiency anemias: Secondary | ICD-10-CM

## 2021-05-03 DIAGNOSIS — E538 Deficiency of other specified B group vitamins: Secondary | ICD-10-CM | POA: Insufficient documentation

## 2021-05-03 DIAGNOSIS — Z79899 Other long term (current) drug therapy: Secondary | ICD-10-CM | POA: Insufficient documentation

## 2021-05-03 DIAGNOSIS — D509 Iron deficiency anemia, unspecified: Secondary | ICD-10-CM | POA: Diagnosis not present

## 2021-05-03 MED ORDER — CYANOCOBALAMIN 1000 MCG/ML IJ SOLN
1000.0000 ug | Freq: Once | INTRAMUSCULAR | Status: AC
  Administered 2021-05-03: 1000 ug via INTRAMUSCULAR

## 2021-05-03 NOTE — Patient Instructions (Signed)

## 2021-05-07 ENCOUNTER — Other Ambulatory Visit: Payer: Self-pay | Admitting: Family Medicine

## 2021-05-07 DIAGNOSIS — Z1231 Encounter for screening mammogram for malignant neoplasm of breast: Secondary | ICD-10-CM

## 2021-05-31 ENCOUNTER — Inpatient Hospital Stay: Payer: 59 | Attending: Hematology and Oncology

## 2021-05-31 ENCOUNTER — Other Ambulatory Visit: Payer: Self-pay

## 2021-05-31 VITALS — BP 132/84 | HR 78 | Temp 98.1°F | Resp 17

## 2021-05-31 DIAGNOSIS — E538 Deficiency of other specified B group vitamins: Secondary | ICD-10-CM

## 2021-05-31 DIAGNOSIS — D509 Iron deficiency anemia, unspecified: Secondary | ICD-10-CM | POA: Diagnosis not present

## 2021-05-31 DIAGNOSIS — Z79899 Other long term (current) drug therapy: Secondary | ICD-10-CM | POA: Insufficient documentation

## 2021-05-31 DIAGNOSIS — D508 Other iron deficiency anemias: Secondary | ICD-10-CM

## 2021-05-31 MED ORDER — CYANOCOBALAMIN 1000 MCG/ML IJ SOLN
1000.0000 ug | Freq: Once | INTRAMUSCULAR | Status: AC
  Administered 2021-05-31: 1000 ug via INTRAMUSCULAR

## 2021-06-03 ENCOUNTER — Telehealth: Payer: Self-pay | Admitting: Orthopaedic Surgery

## 2021-06-03 NOTE — Telephone Encounter (Signed)
Patient moved her right carpal tunnel release from 06-25-21 to 09-24-21 because she is  busy at work this time of year.  She would like to know the turn around time.  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  She asking for doctor or assistant to give her a call.   [phone]

## 2021-06-04 NOTE — Telephone Encounter (Signed)
Out of work for 2 to 4 weeks.

## 2021-06-06 ENCOUNTER — Ambulatory Visit (INDEPENDENT_AMBULATORY_CARE_PROVIDER_SITE_OTHER): Payer: 59

## 2021-06-06 ENCOUNTER — Ambulatory Visit (INDEPENDENT_AMBULATORY_CARE_PROVIDER_SITE_OTHER): Payer: 59 | Admitting: Orthopaedic Surgery

## 2021-06-06 ENCOUNTER — Ambulatory Visit: Payer: Self-pay

## 2021-06-06 ENCOUNTER — Other Ambulatory Visit: Payer: Self-pay

## 2021-06-06 DIAGNOSIS — G5601 Carpal tunnel syndrome, right upper limb: Secondary | ICD-10-CM

## 2021-06-06 DIAGNOSIS — M1711 Unilateral primary osteoarthritis, right knee: Secondary | ICD-10-CM

## 2021-06-06 DIAGNOSIS — M25561 Pain in right knee: Secondary | ICD-10-CM | POA: Diagnosis not present

## 2021-06-06 DIAGNOSIS — M1712 Unilateral primary osteoarthritis, left knee: Secondary | ICD-10-CM

## 2021-06-06 DIAGNOSIS — M25562 Pain in left knee: Secondary | ICD-10-CM | POA: Diagnosis not present

## 2021-06-06 DIAGNOSIS — G8929 Other chronic pain: Secondary | ICD-10-CM | POA: Diagnosis not present

## 2021-06-06 MED ORDER — METHYLPREDNISOLONE ACETATE 40 MG/ML IJ SUSP
40.0000 mg | INTRAMUSCULAR | Status: AC | PRN
  Administered 2021-06-06: 40 mg via INTRA_ARTICULAR

## 2021-06-06 MED ORDER — LIDOCAINE HCL 1 % IJ SOLN
2.0000 mL | INTRAMUSCULAR | Status: AC | PRN
  Administered 2021-06-06: 2 mL

## 2021-06-06 MED ORDER — TRAMADOL HCL 50 MG PO TABS: 50.0000 mg | ORAL_TABLET | Freq: Every day | ORAL | 0 refills | Status: DC | PRN

## 2021-06-06 MED ORDER — BUPIVACAINE HCL 0.5 % IJ SOLN
2.0000 mL | INTRAMUSCULAR | Status: AC | PRN
  Administered 2021-06-06: 2 mL via INTRA_ARTICULAR

## 2021-06-06 NOTE — Progress Notes (Signed)
Office Visit Note   Patient: Meredith Byrd           Date of Birth: 04-Nov-1980           MRN: [id number] Visit Date: 06/06/2021              Requested by: Irena Reichmann, DO [address] [place],  [place] [address] PCP: Irena Reichmann, DO   Assessment & Plan: Visit Diagnoses:  1. Primary osteoarthritis of right knee   2. Primary osteoarthritis of left knee   3. Right carpal tunnel syndrome     Plan: In terms of her knees findings are consistent with chondromalacia and arthritis worse in the right knee.  I reviewed the MRI from last September and she does have areas of focal cartilage loss of the medial femoral condyle.  We discussed that at her age arthroscopic microfracture could be a temporary solution to delay the need for a total knee replacement given how young she is.  She has had multiple injections without significant relief.  I think a total knee replacement at this point would be too drastic.  She will continue with symptomatic treatment in the meantime.  I sent in a prescription for tramadol to see if this will help with the pain.  Otherwise she will continue with current treatment.  Cortisone injection provided today for the right knee.  Follow-Up Instructions: Return if symptoms worsen or fail to improve.   Orders:  Orders Placed This Encounter  Procedures   Large Joint Inj: R knee   XR KNEE 3 VIEW RIGHT   XR KNEE 3 VIEW LEFT   Meds ordered this encounter  Medications   traMADol (ULTRAM) 50 MG tablet    Sig: Take 1-2 tablets (50-100 mg total) by mouth daily as needed.    Dispense:  20 tablet    Refill:  0      Procedures: Large Joint Inj: R knee on 06/06/2021 1:33 PM Indications: pain Details: 22 G needle  Arthrogram: No  Medications: 40 mg methylPREDNISolone acetate 40 MG/ML; 2 mL lidocaine 1 %; 2 mL bupivacaine 0.5 % Consent was given by the patient. Patient was prepped and draped in the usual sterile fashion.      Clinical  Data: No additional findings.   Subjective: Chief Complaint  Patient presents with   Left Knee - Pain   Right Knee - Pain    Patient follows up today for chronic bilateral knee pain worse on the right.  She had an MRI done in September 2021 which showed basically chondromalacia worst in the medial compartment with a degenerative medial meniscal tear.  She feels pain in both of her knees pretty much throughout the day with daily activities.  The pain is worse with using steps.  Its tender to touch at night when she is sleeping so she puts a pillow between her knees.  Voltaren gel is currently not helping.  She is requesting stronger pain medications.  She denies any clicking or mechanical symptoms.  Right carpal tunnel syndrome is stable and scheduled for surgery in the next few months.   Review of Systems  Constitutional: Negative.   HENT: Negative.    Eyes: Negative.   Respiratory: Negative.    Cardiovascular: Negative.   Endocrine: Negative.   Musculoskeletal: Negative.   Neurological: Negative.   Hematological: Negative.   Psychiatric/Behavioral: Negative.    All other systems reviewed and are negative.   Objective: Vital Signs: There were no vitals  taken for this visit.  Physical Exam Vitals and nursing note reviewed.  Constitutional:      Appearance: She is well-developed.  Pulmonary:     Effort: Pulmonary effort is normal.  Skin:    General: Skin is warm.     Capillary Refill: Capillary refill takes less than 2 seconds.  Neurological:     Mental Status: She is alert and oriented to person, place, and time.  Psychiatric:        Behavior: Behavior normal.        Thought Content: Thought content normal.        Judgment: Judgment normal.    Ortho Exam Bilateral knee exams are unchanged.  Mild crepitus with range of motion.  Collaterals and cruciates are stable.  No joint line tenderness.  Trace effusion.  Right hand exam is unchanged. Specialty Comments:  No  specialty comments available.  Imaging: XR KNEE 3 VIEW LEFT  Result Date: 06/06/2021 Mild osteoarthritis.  No significant degenerative changes.  XR KNEE 3 VIEW RIGHT  Result Date: 06/06/2021 Mild osteoarthritis.  No significant degenerative changes.    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   On Depo-Provera for contraception 05/31/2019   Chronic insomnia 05/17/2019   Lumbar facet arthropathy 12/14/2018   Dysmenorrhea, unspecified 05/16/2018   Knee pain, left 11/17/2017   Generalized anxiety disorder 10/04/2017   Morning headache 05/04/2017   Daytime somnolence 05/04/2017   Class 3 obesity with body mass index (BMI) of 45.0 to 49.9 in adult 03/28/2017   Chronic low back pain 01/19/2017   Vitamin D deficiency 01/18/2017   Vitamin B12 deficiency 03/17/2016   Beta thalassemia trait 03/25/2015   Intractable migraine without aura and without status migrainosus 11/23/2014   Elevated sed rate 08/20/2014   Arthritis pain 08/20/2014   Iron deficiency anemia 07/09/2014   Symptomatic anemia 06/05/2014   Chest pain 05/01/2014   GERD (gastroesophageal reflux disease) 03/22/2014   S/P gastric bypass 03/22/2014   Insomnia 03/22/2014   Migraine 03/22/2014   Palpitations 03/22/2014   Past Medical History:  Diagnosis Date   Allergy    Anemia    Anxiety    Arthritis pain 08/20/2014   Back pain    COVID-19    GERD (gastroesophageal reflux disease)    History of stress test 02/2011 ([facility])   there was no evidence of ischemia, but she only went 3 1/2 minutes on the treadmill making it very difficult to get a good accurate assessment, however   Hx of echocardiogram    The echocadiogram was essentially normal with the exception of mild mitral calcification and borderline concentric LVH, which in  the setting of her hypertension at this early age is something that mean her blood pressure is well controlled.    Hypertension    Iron deficiency anemia, unspecified 07/09/2014   Migraines    Palpitations    Swelling    Vitamin B12 deficiency 03/17/2016    Family History  Problem Relation Age of Onset   Stroke Maternal Grandmother    Heart disease Maternal Grandmother    Hypertension Maternal Grandmother    Hyperlipidemia Maternal Grandmother    Breast cancer Maternal Grandmother    Asthma Mother    Heart disease Mother    Diabetes Mother    Early death Mother  Hypertension Mother    Thyroid disease Mother    Anxiety disorder Mother    Hypertension Father    Heart disease Father    Breast cancer Maternal Aunt        under 75   Breast cancer Paternal Aunt     Past Surgical History:  Procedure Laterality Date   BREAST SURGERY     CHOLECYSTECTOMY     GASTRIC BYPASS  2008   HIGH RISK BREAST EXCISION Bilateral    OOPHORECTOMY Right 2010   Social History   Occupational History   Occupation: Insurance Claims  Tobacco Use   Smoking status: Never   Smokeless tobacco: Never  Vaping Use   Vaping Use: Never used  Substance and Sexual Activity   Alcohol use: No   Drug use: No   Sexual activity: Yes

## 2021-06-24 ENCOUNTER — Inpatient Hospital Stay: Payer: 59 | Attending: Hematology and Oncology

## 2021-06-24 DIAGNOSIS — E538 Deficiency of other specified B group vitamins: Secondary | ICD-10-CM | POA: Insufficient documentation

## 2021-06-30 ENCOUNTER — Other Ambulatory Visit: Payer: Self-pay

## 2021-06-30 ENCOUNTER — Ambulatory Visit
Admission: RE | Admit: 2021-06-30 | Discharge: 2021-06-30 | Disposition: A | Discharge status: Home / Self Care | Payer: 59 | Source: Ambulatory Visit | Attending: Family Medicine | Admitting: Family Medicine

## 2021-06-30 DIAGNOSIS — Z1231 Encounter for screening mammogram for malignant neoplasm of breast: Secondary | ICD-10-CM

## 2021-07-02 ENCOUNTER — Encounter: Payer: 59 | Admitting: Orthopaedic Surgery

## 2021-07-05 ENCOUNTER — Inpatient Hospital Stay: Payer: 59

## 2021-07-05 ENCOUNTER — Ambulatory Visit: Payer: 59

## 2021-07-05 VITALS — BP 99/52 | Resp 18

## 2021-07-05 DIAGNOSIS — E538 Deficiency of other specified B group vitamins: Secondary | ICD-10-CM | POA: Diagnosis not present

## 2021-07-05 MED ORDER — CYANOCOBALAMIN 1000 MCG/ML IJ SOLN
1000.0000 ug | Freq: Once | INTRAMUSCULAR | Status: AC
  Administered 2021-07-05: 1000 ug via INTRAMUSCULAR

## 2021-07-05 NOTE — Patient Instructions (Signed)
Cyanocobalamin, Vitamin B12 injection O que  este medicamento? A CIANOCOBALAMINA  uma forma sinttica de vitamina B12. A vitamina B12  essencial para o desenvolvimento de glbulos sanguneos, clulas nervosas e protenas saudveis pelo organismo. Tambm ajuda no metabolismo das gorduras e carboidratos. Este medicamento  usado para tratar pessoas que no conseguem absorver vitamina B12 suficiente. Este medicamento pode ser usado para outros propsitos; em caso de dvidas, pergunte ao seu profissional de sade ou farmacutico. NOMES DE MARCAS COMUNS: B-12 Compliance Kit, B-12 Injection Kit, Cyomin, LA-12, Nutri-Twelve, Physicians EZ Use B-12, Primabalt O que devo dizer a meu profissional de sade antes de tomar este medicamento? Precisam saber se voc tem algum dos seguintes problemas ou estados de sade: doenas renais sndrome ou doena de Leber anemia megaloblstica reao estranha ou alergia  cianocobalamina ou ao cobalto reao estranha ou alergia a outros medicamentos, alimentos, corantes ou conservantes est grvida ou tentando engravidar est amamentando Como devo usar este medicamento? Este medicamento  injetado por via intramuscular ou por injeo subcutnea profunda. Costuma ser administrado por um profissional de sade em consultrio ou Chief of Staff. Porm,  possvel que seu mdico lhe ensine como aplicar suas prprias injees. Siga todas as instrues. Fale com seu pediatra a respeito do uso deste medicamento em crianas. Pode ser preciso tomar alguns cuidados especiais. Superdosagem: Se achar que tomou uma superdosagem deste medicamento, entre em contato imediatamente com o [facility] Intoxicaes ou v a [employer]. OBSERVAO: Este medicamento  s para voc. No compartilhe este medicamento com outras pessoas. E se eu deixar de tomar uma dose? Se toma o medicamento em uma clnica ou no consultrio do seu mdico, ligue para Paramedic a Passenger transport manager. Se aplica as  injees por conta prpria e perdeu uma dose, tome-a assim que possvel. Se j estiver quase na hora da sua prxima dose, tome somente essa dose. No tome o remdio em dobro, nem tome uma dose adicional. O que pode interagir com este medicamento? colchicina consumo pesado de lcool Esta lista pode no descrever todas as interaes possveis. D ao seu profissional de sade uma lista de todos os medicamentos, ervas medicinais, remdios de venda livre, ou suplementos alimentares que voc [place]. Diga tambm se voc fuma, bebe, ou [place] drogas ilcitas. Alguns destes podem interagir com o seu medicamento. Ao que devo ficar atento quando estiver [employer] medicamento? Consulte seu mdico ou profissional de sade para acompanhamento regular Museum/gallery curator. Voc precisar fazer exames de sangue peridicos enquanto estiver [employer]. Voc pode precisar seguir uma dieta especial. Fale com seu mdico. Para conseguir o mximo benefcio deste medicamento, limite o seu consumo de lcool e evite fumar. Que efeitos colaterais posso sentir aps usar este medicamento? Efeitos colaterais que devem ser informados ao seu mdico ou profissional de sade o mais rpido possvel: reaes alrgicas, como erupo na pele, coceira, urticria, ou inchao do rosto, dos lbios ou da lngua pele azulada dor ou aperto no peito chiado no peito ou dificuldade para respirar tontura rea vermelha, inchada e dolorosa na perna Efeitos colaterais que normalmente no precisam de cuidados mdicos (avise ao seu mdico ou profissional de sade se persistirem ou forem incmodos): diarreia dor de cabea Esta lista pode no descrever todos os efeitos colaterais possveis. Para mais orientaes sobre efeitos colaterais, consulte o seu mdico. Voc pode relatar a ocorrncia de efeitos colaterais  FDA pelo telefone [phone]. Onde devo guardar meu medicamento? [facility] fora do [employer]. Conservar em [employer], entre 15 e 30 degreesC (  59 e 86 degreesF). Proteger Administrator, arts. Descartar qualquer medicamento no utilizado aps a data de validade impressa no rtulo ou embalagem. OBSERVAO: Este folheto  um resumo. Pode no cobrir todas as informaes possveis. Se tiver dvidas a respeito deste medicamento, fale com seu mdico, farmacutico ou profissional de sade.  2021 Elsevier/Gold Standard (2010-07-02 00:00:00)

## 2021-07-21 ENCOUNTER — Ambulatory Visit: Payer: 59

## 2021-07-26 ENCOUNTER — Inpatient Hospital Stay: Payer: 59 | Attending: Hematology and Oncology

## 2021-07-26 ENCOUNTER — Other Ambulatory Visit: Payer: Self-pay

## 2021-07-26 VITALS — BP 118/69 | HR 61 | Temp 97.1°F | Resp 20

## 2021-07-26 DIAGNOSIS — Z79899 Other long term (current) drug therapy: Secondary | ICD-10-CM | POA: Diagnosis not present

## 2021-07-26 DIAGNOSIS — E538 Deficiency of other specified B group vitamins: Secondary | ICD-10-CM

## 2021-07-26 DIAGNOSIS — D508 Other iron deficiency anemias: Secondary | ICD-10-CM

## 2021-07-26 MED ORDER — CYANOCOBALAMIN 1000 MCG/ML IJ SOLN
1000.0000 ug | Freq: Once | INTRAMUSCULAR | Status: AC
  Administered 2021-07-26: 1000 ug via INTRAMUSCULAR

## 2021-07-28 ENCOUNTER — Telehealth: Payer: Self-pay

## 2021-07-28 NOTE — Telephone Encounter (Signed)
I submitted a PA request for Ubrelvy on CMM, Key: [id number]. Awaiting determination from [employer].

## 2021-07-30 NOTE — Telephone Encounter (Signed)
Pt has coverage through [employer]. LPF#[id number] for Ubrelvy 50mg  approved through 07/28/2022.

## 2021-08-22 ENCOUNTER — Other Ambulatory Visit: Payer: Self-pay | Admitting: Hematology and Oncology

## 2021-08-22 ENCOUNTER — Inpatient Hospital Stay: Payer: 59 | Admitting: Hematology and Oncology

## 2021-08-22 ENCOUNTER — Inpatient Hospital Stay: Payer: 59

## 2021-08-22 DIAGNOSIS — E538 Deficiency of other specified B group vitamins: Secondary | ICD-10-CM

## 2021-08-23 ENCOUNTER — Inpatient Hospital Stay: Payer: 59

## 2021-09-05 ENCOUNTER — Other Ambulatory Visit: Payer: Self-pay

## 2021-09-05 ENCOUNTER — Inpatient Hospital Stay: Payer: 59

## 2021-09-05 ENCOUNTER — Inpatient Hospital Stay (HOSPITAL_BASED_OUTPATIENT_CLINIC_OR_DEPARTMENT_OTHER): Payer: 59 | Admitting: Hematology and Oncology

## 2021-09-05 ENCOUNTER — Inpatient Hospital Stay: Payer: 59 | Attending: Hematology and Oncology

## 2021-09-05 VITALS — BP 99/62 | HR 66 | Temp 97.2°F | Resp 17 | Wt 184.3 lb

## 2021-09-05 DIAGNOSIS — Z9884 Bariatric surgery status: Secondary | ICD-10-CM | POA: Insufficient documentation

## 2021-09-05 DIAGNOSIS — E538 Deficiency of other specified B group vitamins: Secondary | ICD-10-CM | POA: Insufficient documentation

## 2021-09-05 DIAGNOSIS — D508 Other iron deficiency anemias: Secondary | ICD-10-CM

## 2021-09-05 DIAGNOSIS — D563 Thalassemia minor: Secondary | ICD-10-CM | POA: Diagnosis not present

## 2021-09-05 DIAGNOSIS — D509 Iron deficiency anemia, unspecified: Secondary | ICD-10-CM | POA: Insufficient documentation

## 2021-09-05 DIAGNOSIS — Z79899 Other long term (current) drug therapy: Secondary | ICD-10-CM | POA: Insufficient documentation

## 2021-09-05 LAB — CBC WITH DIFFERENTIAL (CANCER CENTER ONLY)
Abs Immature Granulocytes: 0.01 10*3/uL (ref 0.00–0.07)
Basophils Absolute: 0 10*3/uL (ref 0.0–0.1)
Basophils Relative: 0 %
Eosinophils Absolute: 0 10*3/uL (ref 0.0–0.5)
Eosinophils Relative: 1 %
HCT: 35.5 % — ABNORMAL LOW (ref 36.0–46.0)
Hemoglobin: 11.3 g/dL — ABNORMAL LOW (ref 12.0–15.0)
Immature Granulocytes: 0 %
Lymphocytes Relative: 37 %
Lymphs Abs: 1.5 10*3/uL (ref 0.7–4.0)
MCH: 25.3 pg — ABNORMAL LOW (ref 26.0–34.0)
MCHC: 31.8 g/dL (ref 30.0–36.0)
MCV: 79.6 fL — ABNORMAL LOW (ref 80.0–100.0)
Monocytes Absolute: 0.3 10*3/uL (ref 0.1–1.0)
Monocytes Relative: 7 %
Neutro Abs: 2.2 10*3/uL (ref 1.7–7.7)
Neutrophils Relative %: 55 %
Platelet Count: 435 10*3/uL — ABNORMAL HIGH (ref 150–400)
RBC: 4.46 MIL/uL (ref 3.87–5.11)
RDW: 15.3 % (ref 11.5–15.5)
WBC Count: 4.1 10*3/uL (ref 4.0–10.5)
nRBC: 0 % (ref 0.0–0.2)

## 2021-09-05 LAB — CMP (CANCER CENTER ONLY)
ALT: 27 U/L (ref 0–44)
AST: 20 U/L (ref 15–41)
Albumin: 3.7 g/dL (ref 3.5–5.0)
Alkaline Phosphatase: 59 U/L (ref 38–126)
Anion gap: 10 (ref 5–15)
BUN: 7 mg/dL (ref 6–20)
CO2: 23 mmol/L (ref 22–32)
Calcium: 9.1 mg/dL (ref 8.9–10.3)
Chloride: 108 mmol/L (ref 98–111)
Creatinine: 0.74 mg/dL (ref 0.44–1.00)
GFR, Estimated: >60 mL/min (ref >60)
Glucose, Bld: 108 mg/dL — ABNORMAL HIGH (ref 70–99)
Potassium: 4 mmol/L (ref 3.5–5.1)
Sodium: 141 mmol/L (ref 135–145)
Total Bilirubin: 0.6 mg/dL (ref 0.3–1.2)
Total Protein: 7.2 g/dL (ref 6.5–8.1)

## 2021-09-05 LAB — IRON AND TIBC
Iron: 97 ug/dL (ref 41–142)
Saturation Ratios: 27 % (ref 21–57)
TIBC: 360 ug/dL (ref 236–444)
UIBC: 263 ug/dL (ref 120–384)

## 2021-09-05 LAB — VITAMIN B12: Vitamin B-12: 176 pg/mL — ABNORMAL LOW (ref 180–914)

## 2021-09-05 LAB — FERRITIN: Ferritin: 105 ng/mL (ref 11–307)

## 2021-09-05 LAB — LACTATE DEHYDROGENASE: LDH: 124 U/L (ref 98–192)

## 2021-09-05 MED ORDER — CYANOCOBALAMIN 1000 MCG/ML IJ SOLN: 1000.0000 ug | Freq: Once | INTRAMUSCULAR | Status: DC

## 2021-09-05 MED ORDER — CYANOCOBALAMIN 1000 MCG/ML IJ SOLN
1000.0000 ug | Freq: Once | INTRAMUSCULAR | Status: AC
  Administered 2021-09-05: 1000 ug via INTRAMUSCULAR
  Filled 2021-09-05: qty 1

## 2021-09-05 NOTE — Patient Instructions (Signed)
Vitamin B12 Deficiency ?Vitamin B12 deficiency occurs when the body does not have enough vitamin B12, which is an important vitamin. The body needs this vitamin: ?To make red blood cells. ?To make DNA. This is the genetic material inside cells. ?To help the nerves work properly so they can carry messages from the brain to the body. ?Vitamin B12 deficiency can cause various health problems, such as a low red blood cell count (anemia) or nerve damage. ?What are the causes? ?This condition may be caused by: ?Not eating enough foods that contain vitamin B12. ?Not having enough stomach acid and digestive fluids to properly absorb vitamin B12 from the food that you eat. ?Certain digestive system diseases that make it hard to absorb vitamin B12. These diseases include Crohn's disease, chronic pancreatitis, and cystic fibrosis. ?A condition in which the body does not make enough of a protein (intrinsic factor), resulting in too few red blood cells (pernicious anemia). ?Having a surgery in which part of the stomach or small intestine is removed. ?Taking certain medicines that make it hard for the body to absorb vitamin B12. These medicines include: ?Heartburn medicines (antacids and proton pump inhibitors). ?Certain antibiotic medicines. ?Some medicines that are used to treat diabetes, tuberculosis, gout, or high cholesterol. ?What increases the risk? ?The following factors may make you more likely to develop a B12 deficiency: ?Being older than age 50. ?Eating a vegetarian or vegan diet, especially while you are pregnant. ?Eating a poor diet while you are pregnant. ?Taking certain medicines. ?Having alcoholism. ?What are the signs or symptoms? ?In some cases, there are no symptoms of this condition. If the condition leads to anemia or nerve damage, various symptoms can occur, such as: ?Weakness. ?Fatigue. ?Loss of appetite. ?Weight loss. ?Numbness or tingling in your hands and feet. ?Redness and burning of the  tongue. ?Confusion or memory problems. ?Depression. ?Sensory problems, such as color blindness, ringing in the ears, or loss of taste. ?Diarrhea or constipation. ?Trouble walking. ?If anemia is severe, symptoms can include: ?Shortness of breath. ?Dizziness. ?Rapid heart rate (tachycardia). ?How is this diagnosed? ?This condition may be diagnosed with a blood test to measure the level of vitamin B12 in your blood. You may also have other tests, including: ?A group of tests that measure certain characteristics of blood cells (complete blood count, CBC). ?A blood test to measure intrinsic factor. ?A procedure where a thin tube with a camera on the end is used to look into your stomach or intestines (endoscopy). ?Other tests may be needed to discover the cause of B12 deficiency. ?How is this treated? ?Treatment for this condition depends on the cause. This condition may be treated by: ?Changing your eating and drinking habits, such as: ?Eating more foods that contain vitamin B12. ?Drinking less alcohol or no alcohol. ?Getting vitamin B12 injections. ?Taking vitamin B12 supplements. Your health care provider will tell you which dosage is best for you. ?Follow these instructions at home: ?Eating and drinking ? ?Eat lots of healthy foods that contain vitamin B12, including: ?Meats and poultry. This includes beef, pork, chicken, turkey, and organ meats, such as liver. ?Seafood. This includes clams, rainbow trout, salmon, tuna, and haddock. ?Eggs. ?Cereal and dairy products that are fortified. This means that vitamin B12 has been added to the food. Check the label on the package to see if the food is fortified. ?The items listed above may not be a complete list of recommended foods and beverages. Contact a dietitian for more information. ?General instructions ?Get any   injections that are prescribed by your health care provider. ?Take supplements only as told by your health care provider. Follow the directions carefully. ?Do  not drink alcohol if your health care provider tells you not to. In some cases, you may only be asked to limit alcohol use. ?Keep all follow-up visits as told by your health care provider. This is important. ?Contact a health care provider if: ?Your symptoms come back. ?Get help right away if you: ?Develop shortness of breath. ?Have a rapid heart rate. ?Have chest pain. ?Become dizzy or lose consciousness. ?Summary ?Vitamin B12 deficiency occurs when the body does not have enough vitamin B12. ?The main causes of vitamin B12 deficiency include dietary deficiency, digestive diseases, pernicious anemia, and having a surgery in which part of the stomach or small intestine is removed. ?In some cases, there are no symptoms of this condition. If the condition leads to anemia or nerve damage, various symptoms can occur, such as weakness, shortness of breath, and numbness. ?Treatment may include getting vitamin B12 injections or taking vitamin B12 supplements. Eat lots of healthy foods that contain vitamin B12. ?This information is not intended to replace advice given to you by your health care provider. Make sure you discuss any questions you have with your health care provider. ?Document Revised: 03/26/2021 Document Reviewed: 06/07/2018 ?Elsevier Patient Education ? 2022 Elsevier Inc. ? ?

## 2021-09-05 NOTE — Progress Notes (Signed)
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 Lorenso Courier  12/20-12/27/2021: IV feraheme 510mg  q 7 days x 2 doses and 2 doses of IM Vitamin b12 1062mcg.  12/26/2020: WBC 4.3, Hgb 11.8, MCV 79.4, Plt 432  Interval History:  Meredith Byrd 40 y.o. female with medical history significant for iron deficiency anemia 2/2 to gastric bipass surgery who presents for a follow up visit. The patient's last visit was on 03/21/2021. In the interim since the last visit she has had no major changes in her health.   On exam today [name] reports she has been "so-so" in the interim since her last visit.  She reports that she continues to have low energy levels.  She thinks some of this may be due to her "hectic life".  She notes that she puts her energy levels at about 30 to 35% at this time.  She denies any overt signs of bleeding at this time.  Her appetite has been good and she has had stable weight.  She otherwise denies any fevers, sweats, nausea, vomiting or diarrhea.  She is had no other changes in her health at this time.  A full 10 point ROS is listed below.  MEDICAL HISTORY:  Past Medical History:  Diagnosis Date   Allergy    Anemia    Anxiety    Arthritis pain 08/20/2014   Back pain    COVID-19    GERD (gastroesophageal reflux disease)    History of stress test 02/2011 ([facility])   there was no evidence of ischemia, but she  only went 3 1/2 minutes on the treadmill making it very difficult to get a good accurate assessment, however   Hx of echocardiogram    The echocadiogram was essentially normal with the exception of mild mitral calcification and borderline concentric LVH, which in the setting of her hypertension at this early age is something that mean her blood pressure is well controlled.    Hypertension    Iron deficiency anemia, unspecified 07/09/2014   Migraines    Palpitations    Swelling    Vitamin B12 deficiency 03/17/2016    SURGICAL HISTORY: Past Surgical History:  Procedure Laterality Date   BREAST SURGERY     CHOLECYSTECTOMY     GASTRIC BYPASS  2008   OOPHORECTOMY Right 2010   REDUCTION MAMMAPLASTY Bilateral     SOCIAL HISTORY: Social History   Socioeconomic History   Marital status: Married    Spouse name: Meredith Byrd   Number of children: 4   Years of education: Associates   Highest education level: Not on file  Occupational History   Occupation: Insurance Claims  Tobacco Use   Smoking status: Never   Smokeless tobacco: Never  Vaping Use   Vaping Use: Never used  Substance and Sexual Activity   Alcohol use: No   Drug use: No   Sexual activity: Yes  Other Topics Concern   Not on file  Social History Narrative   Right  handed.   Lives at home with husband and four children.   No caffeine use.   Social Determinants of Health   Financial Resource Strain: Not on file  Food Insecurity: Not on file  Transportation Needs: Not on file  Physical Activity: Not on file  Stress: Not on file  Social Connections: Not on file  Intimate Partner Violence: Not on file    FAMILY HISTORY: Family History  Problem Relation Age of Onset   Asthma Mother    Heart disease Mother    Diabetes Mother    Early death Mother    Hypertension Mother    Thyroid disease Mother    Anxiety disorder Mother    Hypertension Father    Heart disease Father    Breast cancer Sister 38   Breast cancer  Maternal Aunt        under 67   Breast cancer Paternal Aunt    Stroke Maternal Grandmother    Heart disease Maternal Grandmother    Hypertension Maternal Grandmother    Hyperlipidemia Maternal Grandmother    Breast cancer Maternal Grandmother     ALLERGIES:  is allergic to doxycycline, fioricet [butalbital-apap-caffeine], inderal [propranolol], and nsaids.  MEDICATIONS:  Current Outpatient Medications  Medication Sig Dispense Refill   acetaminophen (TYLENOL) 500 MG tablet Take 2 tablets (1,000 mg total) by mouth every 6 (six) hours as needed. 30 tablet 0   albuterol (VENTOLIN HFA) 108 (90 Base) MCG/ACT inhaler Inhale 1-2 puffs into the lungs every 6 (six) hours as needed for wheezing or shortness of breath. 18 g 0   clonazePAM (KLONOPIN) 2 MG tablet Take 2 mg by mouth daily as needed.     cloNIDine (CATAPRES) 0.2 MG tablet Take 0.4 mg by mouth at bedtime.     diazepam (VALIUM) 5 MG tablet Take 1 by mouth 1 hour  pre-procedure with very light food. May bring 2nd tablet to appointment. 2 tablet 0   diclofenac Sodium (VOLTAREN) 1 % GEL APPLY 4 GRAMS TOPICALLY 4 TIMES DAILY 100 g 1   Docusate Sodium (DSS) 100 MG CAPS docusate sodium 100 mg capsule  TAKE 1 CAPSULE BY MOUTH TWICE A DAY     folic acid (FOLVITE) 1 MG tablet Take 1 tablet (1 mg total) by mouth daily. 90 tablet 1   Galcanezumab-gnlm (EMGALITY) 120 MG/ML SOAJ Inject 120 mg as directed every 30 (thirty) days. 1 mL 11   hydrocortisone (ANUSOL-HC) 2.5 % rectal cream Place 1 application rectally 2 (two) times daily. 60 g 0   medroxyPROGESTERone (DEPO-PROVERA) 150 MG/ML injection      ondansetron (ZOFRAN ODT) 4 MG disintegrating tablet Take 1 tablet (4 mg total) by mouth every 4 (four) hours as needed for nausea or vomiting. 20 tablet 0   tiZANidine (ZANAFLEX) 4 MG tablet TAKE 1 TABLET BY MOUTH TWICE A DAY AS NEEDED FOR MUSCLE SPASMS 180 tablet 0   traMADol (ULTRAM) 50 MG tablet Take 1-2 tablets (50-100 mg total) by mouth daily as  needed. 20 tablet 0   traZODone (DESYREL) 100 MG tablet      Ubrogepant (UBRELVY) 50 MG TABS Take 1 tab at onset of migraine.  May repeat in 2 hrs, if needed.  Max dose: 2 tabs/day. This is a 30 day prescription. 12 tablet 11   Current Facility-Administered Medications  Medication Dose Route Frequency Provider Last Rate Last Admin   diclofenac Sodium (VOLTAREN) 1 % topical gel 4 g  4 g Topical QID Eldred Manges, MD  REVIEW OF SYSTEMS:   Constitutional: ( - ) fevers, ( - )  chills , ( - ) night sweats Eyes: ( - ) blurriness of vision, ( - ) double vision, ( - ) watery eyes Ears, nose, mouth, throat, and face: ( - ) mucositis, ( - ) sore throat Respiratory: ( - ) cough, ( - ) dyspnea, ( - ) wheezes Cardiovascular: ( - ) palpitation, ( - ) chest discomfort, ( - ) lower extremity swelling Gastrointestinal:  ( - ) nausea, ( - ) heartburn, ( - ) change in bowel habits Skin: ( - ) abnormal skin rashes Lymphatics: ( - ) new lymphadenopathy, ( - ) easy bruising Neurological: ( - ) numbness, ( - ) tingling, ( - ) new weaknesses Behavioral/Psych: ( - ) mood change, ( - ) new changes  All other systems were reviewed with the patient and are negative.  PHYSICAL EXAMINATION:  Vitals:   09/05/21 0823  BP: 99/62  Pulse: 66  Resp: 17  Temp: (!) 97.2 F (36.2 C)  SpO2: 100%     Filed Weights   09/05/21 0823  Weight: 184 lb 4.8 oz (83.6 kg)    GENERAL: tired appearing middle aged [place] American female. alert, no distress and comfortable SKIN: skin color, texture, turgor are normal, no rashes or significant lesions EYES: conjunctiva are pink and non-injected, sclera clear LUNGS: clear to auscultation and percussion with normal breathing effort HEART: regular rate & rhythm and no murmurs and no lower extremity edema Musculoskeletal: no cyanosis of digits and no clubbing  PSYCH: alert & oriented x 3, fluent speech NEURO: no focal motor/sensory deficits  LABORATORY DATA:  I have  reviewed the data as listed CBC Latest Ref Rng & Units 09/05/2021 03/21/2021 12/26/2020  WBC 4.0 - 10.5 K/uL 4.1 3.8(L) 4.3  Hemoglobin 12.0 - 15.0 g/dL 11.3(L) 11.4(L) 11.8(L)  Hematocrit 36.0 - 46.0 % 35.5(L) 35.4(L) 37.4  Platelets 150 - 400 K/uL 435(H) 371 432(H)    CMP Latest Ref Rng & Units 09/05/2021 03/21/2021 12/26/2020  Glucose 70 - 99 mg/dL 108(H) 98 92  BUN 6 - 20 mg/dL 7 6 5(L)  Creatinine 0.44 - 1.00 mg/dL 0.74 0.73 0.74  Sodium 135 - 145 mmol/L 141 141 136  Potassium 3.5 - 5.1 mmol/L 4.0 4.0 3.4(L)  Chloride 98 - 111 mmol/L 108 107 105  CO2 22 - 32 mmol/L 23 25 24   Calcium 8.9 - 10.3 mg/dL 9.1 9.2 9.3  Total Protein 6.5 - 8.1 g/dL 7.2 7.0 8.1  Total Bilirubin 0.3 - 1.2 mg/dL 0.6 0.6 0.4  Alkaline Phos 38 - 126 U/L 59 52 58  AST 15 - 41 U/L 20 14(L) 17  ALT 0 - 44 U/L 27 22 23     RADIOGRAPHIC STUDIES: I have personally reviewed the radiological images as listed and agreed with the findings in the report. No results found.  ASSESSMENT & PLAN Meredith Byrd 40 y.o. female with medical history significant for iron deficiency anemia 2/2 to gastric bipass surgery who presents for a follow up visit.  After review the labs, the records, schedule the patient the findings most consistent with persistent anemia in the setting of known iron deficiency anemia and vitamin B12 deficiency due to gastric bypass surgery.  The patient received 2 doses of IV iron with weekly IM vitamin B12 x 1 month and did not have any improvement in her hemoglobin or her symptoms.  Today we will recheck her iron levels and vitamin B12 levels  in order to assure that these were effective in bolstering her levels.  Given her symptoms today and the decrease in hemoglobin I am concerned that she may have greater deficits than anticipated and may require additional doses.  We will plan to have the patient return to clinic ending the results of her studies today.  # Iron Deficiency Anemia 2/2 to Gastric  Bipass Surgery #Vitamin B12 Deficiency 2/2 to Gastric Bipass Surgery --Findings are most consistent with iron deficiency anemia/Vitamin B12 deficiency secondary to poor absorption in the setting of gastric bypass surgery.  There may also be a component of beta thalassemia trait causing her lab findings. --Today we will recheck CMP, CBC, iron panel, and ferritin. --If patient is found to have continued low levels of iron would recommend proceeding with Feraheme 510 mg IV q. 7 days x 2 doses --transition to vitamin b12 PO daily.  --Hgb 11.3 today, stable from prior.  --Return to clinic in 6 months   #Fatigue, stable --Patient still has marked fatigue despite adequate repletion of iron and vitamin B12.  She is low in folate but that should not cause low energy. --normal TSH and hemoglobin A1c at last check --If these do do not reveal the etiology I am uncertain what the cause is, potentially psychiatric in nature. Would recommend continued follow-up with her PCP.   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   No orders of the defined types were placed in this encounter.   All questions were answered. The patient knows to call the clinic with any problems, questions or concerns.  A total of more than 30 minutes were spent on this encounter and over half of that time was spent on counseling and coordination of care as outlined above.   Ulysees Barns, MD Department of Hematology/Oncology [facility] Phone: [phone] Pager: [phone] Email: [email or web address]  09/07/2021 2:57 PM

## 2021-09-07 ENCOUNTER — Encounter: Payer: Self-pay | Admitting: Hematology and Oncology

## 2021-09-08 ENCOUNTER — Telehealth: Payer: Self-pay | Admitting: Orthopaedic Surgery

## 2021-09-08 NOTE — Telephone Encounter (Signed)
Pt dropped Short term disability forms off. Pt was notified payment of $25.00 to [employer] and medical release form need to be done to start process. Pt will come tomorrow 11/29 to sign medical release form and make $25.00 payment. [employer] will has disability forms. Notified Tammy from [employer] . Pt states to come around 1 to 1:15 pm

## 2021-09-09 ENCOUNTER — Encounter: Payer: Self-pay | Admitting: Hematology and Oncology

## 2021-09-10 ENCOUNTER — Telehealth: Payer: Self-pay | Admitting: Hematology and Oncology

## 2021-09-10 NOTE — Telephone Encounter (Signed)
Scheduled per sch msg. Called, not able to leave msg  

## 2021-09-11 ENCOUNTER — Ambulatory Visit: Payer: 59 | Admitting: Neurology

## 2021-09-11 NOTE — Progress Notes (Signed)
PATIENT: Meredith Byrd DOB: 1980/11/01  REASON FOR VISIT: follow up HISTORY FROM: patient PRIMARY NEUROLOGIST:   HISTORY OF PRESENT ILLNESS: Today 09/11/21  HISTORY Meredith Byrd is 40 years old right-handed African-American female, is referred by her primary care physician nurse practitioner Marva Panda for evaluation of migraine headaches.Initial evaluation was in February 2016.   She had long-standing history of migraine, since elementary school, her typical migraine are lateralized severe pounding headache with associated light noise sensitivity, nauseous, can last for few days, even after few weeks. 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   She now complains of increased headache frequency, since 2014, she is having 3-4 typical migraine headaches each week, each episode last for a few days, in January, she has one severe episode of migraine lasting for 2 weeks. She was not able to identify triggers, she has tried triptan treatment in the past, Imitrex, cause heart racing, tightness, Maxalt, similar side effect, without helping her headache much.   Over the past 18 months, she was given Stadol nasal spray for headaches, she gets 3 bottles of 10 mg Stadol nasal spray each month, this was prescribed by her previous primary care physician [facility], but always has recurrent headaches shortly afterwards.   UPDATE March 24th 2016: She reported only temporary partial relief with previous nerve block in November 23 2014, she had tried Zomig nasal spray 10 times, without helping her headaches, Previously Preventive medications, Topamax works for her headaches, but she is trying to conceiving again, does not want to take too much medications, I have went over  other possibilities including Inderal, nortriptyline, all belongs to category C, I also advised her avoiding triptan, and Stadol treatment during pregnancy,   She had 4 children, no significant headache during her previous pregnancy   UPDATE Feb 09 2017: She continue complains of frequent migraines about 10-15 days each month, sometimes preceded by visual distortion, usually short lasting, but sometimes she woke up with severe migraine headache, last visit was in March 2016, over the past 2 years, she was managed by her primary care physician, per patient, she was given prescription of Stadol, tramadol, but we were not able to find her refill information from [facility].   She states she has tried and failed multiple preventative medications, Topamax,cause too much memory issues, almost lost her job,   For abortive treatment she has tried Maxalt, Zomig, sumatriptan, none of above works for her, some of the medication even made it more intense.   UPDATE Oct 28 2017: She continue have 15-20 migraine headaches days each month, she has tried different preventive medications, Topamax, nortriptyline, Inderal, without helping her headaches,   She has suboptimal response to triptan treatment, imitrex, zomig, relpax, maxalt. 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[facility], does not  want to go to [facility] or [facility] for second opinion,    UPDATE May 17 2019: She continues to have frequent headaches, 12-15 each month, lateralized severe pounding  headache was associated light, noise sensitivity, nauseous, previously triptan was not helpful, in addition, she complains of has tried trazodone, nortriptyline,temazepam, Ambien not helping her sleep, she had sleep study in November 2018, there was no evidence of obstructive sleep apnea, previous Emgality was helping her migraine some, she was taking tramadol as needed, wants to try to Ronnette Juniper this time   Update September 11, 2020 SS: Reports 9-12 migraines a month, this is a big improvement for her, used to have severe migraine daily.  Currently taking Lin Givens for rescue.  Really pleased with current medications.  Stopped Zonegran, did not work.  PCP prescribing clonazepam, helps to sleep, less nighttime migraines.  Bernita Raisin may take 3 hours to work, then takes 2nd tablet.  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  Unsure about Botox, not fond of needles, could not finish nerve block.  Update 09/12/21 MM:  Meredith Byrd is a 40 year old female with a history of migraine headaches.  She states that she is having approximately 17 headaches a month.  She states before she was having 13-15 headaches a month but she has some new stressors that have increased her headache frequency.  The patient has a fear of needles so she is not interested in Botox.  She is currently taking Emgality and Nurtec.  She is interested in discussing another preventative medication.  Reports that when she takes Nurtec it is beneficial but she typically has to take the second dose.      REVIEW OF SYSTEMS: Out of a complete 14 system review of symptoms, the patient complains only of the following symptoms, and all other reviewed systems are negative.  ALLERGIES: Allergies  Allergen Reactions   Doxycycline Nausea And Vomiting   Fioricet [Butalbital-Apap-Caffeine] Other (See Comments)    GI upset   Inderal [Propranolol] Nausea Only    Dropped BP too low   Nsaids Nausea Only    HOME  MEDICATIONS: Outpatient Medications Prior to Visit  Medication Sig Dispense Refill   acetaminophen (TYLENOL) 500 MG tablet Take 2 tablets (1,000 mg total) by mouth every 6 (six) hours as needed. 30 tablet 0   albuterol (VENTOLIN HFA) 108 (90 Base) MCG/ACT inhaler Inhale 1-2 puffs into the lungs every 6 (six) hours as needed for wheezing or shortness of breath. 18 g 0   clonazePAM (KLONOPIN) 2 MG tablet Take 2 mg by mouth daily as needed.     cloNIDine (CATAPRES) 0.2 MG tablet Take 0.4 mg by mouth at bedtime.     diazepam (VALIUM) 5 MG tablet Take 1 by mouth 1 hour  pre-procedure with very light food. May bring 2nd tablet to appointment. 2 tablet 0   diclofenac Sodium (VOLTAREN) 1 % GEL APPLY 4 GRAMS TOPICALLY 4 TIMES DAILY 100 g 1   Docusate Sodium (DSS) 100 MG CAPS docusate sodium 100 mg capsule  TAKE 1 CAPSULE BY MOUTH TWICE A DAY     folic acid (FOLVITE) 1 MG tablet Take 1 tablet (1 mg total) by mouth daily. 90 tablet 1   Galcanezumab-gnlm (EMGALITY) 120 MG/ML SOAJ Inject 120 mg as directed every 30 (thirty) days. 1 mL 11   hydrocortisone (ANUSOL-HC) 2.5 % rectal cream Place 1 application rectally 2 (two) times daily. 60 g 0   medroxyPROGESTERone (DEPO-PROVERA) 150 MG/ML injection  ondansetron (ZOFRAN ODT) 4 MG disintegrating tablet Take 1 tablet (4 mg total) by mouth every 4 (four) hours as needed for nausea or vomiting. 20 tablet 0   tiZANidine (ZANAFLEX) 4 MG tablet TAKE 1 TABLET BY MOUTH TWICE A DAY AS NEEDED FOR MUSCLE SPASMS 180 tablet 0   traMADol (ULTRAM) 50 MG tablet Take 1-2 tablets (50-100 mg total) by mouth daily as needed. 20 tablet 0   traZODone (DESYREL) 100 MG tablet      Ubrogepant (UBRELVY) 50 MG TABS Take 1 tab at onset of migraine.  May repeat in 2 hrs, if needed.  Max dose: 2 tabs/day. This is a 30 day prescription. 12 tablet 11   Facility-Administered Medications Prior to Visit  Medication Dose Route Frequency Provider Last Rate Last Admin   diclofenac Sodium  (VOLTAREN) 1 % topical gel 4 g  4 g Topical QID Eldred Manges, MD        PAST MEDICAL HISTORY: Past Medical History:  Diagnosis Date   Allergy    Anemia    Anxiety    Arthritis pain 08/20/2014   Back pain    COVID-19    GERD (gastroesophageal reflux disease)    History of stress test 02/2011 ([facility])   there was no evidence of ischemia, but she only went 3 1/2 minutes on the treadmill making it very difficult to get a good accurate assessment, however   Hx of echocardiogram    The echocadiogram was essentially normal with the exception of mild mitral calcification and borderline concentric LVH, which in the setting of her hypertension at this early age is something that mean her blood pressure is well controlled.    Hypertension    Iron deficiency anemia, unspecified 07/09/2014   Migraines    Palpitations    Swelling    Vitamin B12 deficiency 03/17/2016    PAST SURGICAL HISTORY: Past Surgical History:  Procedure Laterality Date   BREAST SURGERY     CHOLECYSTECTOMY     GASTRIC BYPASS  2008   OOPHORECTOMY Right 2010   REDUCTION MAMMAPLASTY Bilateral     FAMILY HISTORY: Family History  Problem Relation Age of Onset   Asthma Mother    Heart disease Mother    Diabetes Mother    Early death Mother    Hypertension Mother    Thyroid disease Mother    Anxiety disorder Mother    Hypertension Father    Heart disease Father    Breast cancer Sister 60   Breast cancer Maternal Aunt        under 4   Breast cancer Paternal Aunt    Stroke Maternal Grandmother    Heart disease Maternal Grandmother    Hypertension Maternal Grandmother    Hyperlipidemia Maternal Grandmother    Breast cancer Maternal Grandmother     SOCIAL HISTORY: Social History   Socioeconomic History   Marital status: Married    Spouse name: Damon   Number of children: 4   Years of education: Associates   Highest education level: Not on file  Occupational History   Occupation: Insurance Claims  Tobacco Use    Smoking status: Never   Smokeless tobacco: Never  Vaping Use   Vaping Use: Never used  Substance and Sexual Activity   Alcohol use: No   Drug use: No   Sexual activity: Yes  Other Topics Concern   Not on file  Social History Narrative   Right handed.   Lives at home with husband and four  children.   No caffeine use.   Social Determinants of Health   Financial Resource Strain: Not on file  Food Insecurity: Not on file  Transportation Needs: Not on file  Physical Activity: Not on file  Stress: Not on file  Social Connections: Not on file  Intimate Partner Violence: Not on file      PHYSICAL EXAM  Vitals:   09/12/21 0845  BP: 135/79  Pulse: 84  Weight: 185 lb (83.9 kg)  Height: 5' (1.524 m)   Body mass index is 36.13 kg/m.  Generalized: Well developed, in no acute distress   Neurological examination  Mentation: Alert oriented to time, place, history taking. Follows all commands speech and language fluent Cranial nerve II-XII: Pupils were equal round reactive to light. Extraocular movements were full, visual field were full on confrontational test. Facial sensation and strength were normal.  Head turning and shoulder shrug  were normal and symmetric. Motor: The motor testing reveals 5 over 5 strength of all 4 extremities. Good symmetric motor tone is noted throughout.  Sensory: Sensory testing is intact to soft touch on all 4 extremities. No evidence of extinction is noted.  Coordination: Cerebellar testing reveals good finger-nose-finger and heel-to-shin bilaterally.  Gait and station: Gait is normal.    DIAGNOSTIC DATA (LABS, IMAGING, TESTING) - I reviewed patient records, labs, notes, testing and imaging myself where available.  Lab Results  Component Value Date   WBC 4.1 09/05/2021   HGB 11.3 (L) 09/05/2021   HCT 35.5 (L) 09/05/2021   MCV 79.6 (L) 09/05/2021   PLT 435 (H) 09/05/2021      Component Value Date/Time   NA 141 09/05/2021 0804   NA 136  06/29/2017 0917   K 4.0 09/05/2021 0804   CL 108 09/05/2021 0804   CO2 23 09/05/2021 0804   GLUCOSE 108 (H) 09/05/2021 0804   BUN 7 09/05/2021 0804   BUN 7 06/29/2017 0917   CREATININE 0.74 09/05/2021 0804   CREATININE 0.65 03/22/2014 1217   CALCIUM 9.1 09/05/2021 0804   PROT 7.2 09/05/2021 0804   PROT 6.7 06/29/2017 0917   ALBUMIN 3.7 09/05/2021 0804   ALBUMIN 3.8 06/29/2017 0917   AST 20 09/05/2021 0804   ALT 27 09/05/2021 0804   ALKPHOS 59 09/05/2021 0804   BILITOT 0.6 09/05/2021 0804   GFRNONAA >60 09/05/2021 0804   GFRNONAA >89 03/22/2014 1217   GFRAA >60 02/17/2020 2005   GFRAA >89 03/22/2014 1217   Lab Results  Component Value Date   CHOL 109 06/29/2017   HDL 46 06/29/2017   LDLCALC 47 06/29/2017   TRIG 81 06/29/2017   CHOLHDL 2 01/19/2017   Lab Results  Component Value Date   HGBA1C 4.6 (L) 12/26/2020   Lab Results  Component Value Date   VITAMINB12 176 (L) 09/05/2021   Lab Results  Component Value Date   TSH 1.958 12/26/2020      ASSESSMENT AND PLAN 40 y.o. year old female  has a past medical history of Allergy, Anemia, Anxiety, Arthritis pain (08/20/2014), Back pain, COVID-19, GERD (gastroesophageal reflux disease), History of stress test (02/2011 ([facility])), echocardiogram, Hypertension, Iron deficiency anemia, unspecified (07/09/2014), Migraines, Palpitations, Swelling, and Vitamin B12 deficiency (03/17/2016). here with:  Migraines  Stop Emgality Start Ajovy monthly injection I have attached information to her AVS Continue Ubrelvy for abortive therapy-we will increase dose to 100 mg tablets Ubrelvy 100 mg Sample given to patient lot # [id number], exp 04/2022 Advised if her migraines worsen or she develops new  symptoms she should let us know Follow-up in 6 months or sooner if needed     Butch Penny, MSN, NP-C 09/11/2021, 4:59 PM [facility] [address] [place], [place] [address] [phone]

## 2021-09-12 ENCOUNTER — Ambulatory Visit (INDEPENDENT_AMBULATORY_CARE_PROVIDER_SITE_OTHER): Payer: 59 | Admitting: Adult Health

## 2021-09-12 ENCOUNTER — Encounter: Payer: Self-pay | Admitting: Adult Health

## 2021-09-12 VITALS — BP 135/79 | HR 84 | Ht 60.0 in | Wt 185.0 lb

## 2021-09-12 DIAGNOSIS — G43019 Migraine without aura, intractable, without status migrainosus: Secondary | ICD-10-CM | POA: Diagnosis not present

## 2021-09-12 MED ORDER — UBRELVY 100 MG PO TABS: ORAL_TABLET | ORAL | 11 refills | Status: DC

## 2021-09-12 MED ORDER — AJOVY 225 MG/1.5ML ~~LOC~~ SOAJ: 225.0000 mg | SUBCUTANEOUS | 5 refills | Status: DC

## 2021-09-12 NOTE — Patient Instructions (Signed)
Your Plan:  Stop [employer] Start Ajovy monthly injections Continue ubrelvy- strength changed to 100 mg. If your symptoms worsen or you develop new symptoms please let us know.   Thank you for coming to see [place] at [facility]. I hope we have been able to provide you high quality care today.  You may receive a patient satisfaction survey over the next few weeks. We would appreciate your feedback and comments so that we may continue to improve ourselves and the health of our patients.

## 2021-09-15 ENCOUNTER — Telehealth: Payer: Self-pay

## 2021-09-15 NOTE — Telephone Encounter (Signed)
Received PA request for Ajovy. Completed via CMM. Sent to [employer]. Should have a determination within 3-5 business days. Key: [id number].

## 2021-09-17 ENCOUNTER — Encounter (HOSPITAL_BASED_OUTPATIENT_CLINIC_OR_DEPARTMENT_OTHER)
Admission: RE | Admit: 2021-09-17 | Discharge: 2021-09-17 | Disposition: A | Discharge status: Home / Self Care | Payer: 59 | Source: Ambulatory Visit | Attending: Orthopaedic Surgery | Admitting: Orthopaedic Surgery

## 2021-09-17 ENCOUNTER — Encounter (HOSPITAL_BASED_OUTPATIENT_CLINIC_OR_DEPARTMENT_OTHER): Payer: Self-pay | Admitting: Orthopaedic Surgery

## 2021-09-17 DIAGNOSIS — Z01810 Encounter for preprocedural cardiovascular examination: Secondary | ICD-10-CM | POA: Insufficient documentation

## 2021-09-17 NOTE — Progress Notes (Signed)

## 2021-09-18 NOTE — Telephone Encounter (Signed)
PA for Ajovy was denied because patient has not tried and failed aimovig. Patient was not given a copay card at her visit. I discussed the Ajovy copay card with her and she will activate it online. She will let me know if she has questions or concerns.

## 2021-09-19 ENCOUNTER — Inpatient Hospital Stay: Payer: 59

## 2021-09-20 ENCOUNTER — Inpatient Hospital Stay: Payer: 59 | Attending: Hematology and Oncology

## 2021-09-20 ENCOUNTER — Other Ambulatory Visit: Payer: Self-pay

## 2021-09-20 VITALS — BP 117/67 | HR 62 | Temp 97.7°F | Resp 20

## 2021-09-20 DIAGNOSIS — D508 Other iron deficiency anemias: Secondary | ICD-10-CM

## 2021-09-20 DIAGNOSIS — E538 Deficiency of other specified B group vitamins: Secondary | ICD-10-CM | POA: Diagnosis present

## 2021-09-20 DIAGNOSIS — D509 Iron deficiency anemia, unspecified: Secondary | ICD-10-CM | POA: Diagnosis present

## 2021-09-20 DIAGNOSIS — Z79899 Other long term (current) drug therapy: Secondary | ICD-10-CM | POA: Insufficient documentation

## 2021-09-20 MED ORDER — CYANOCOBALAMIN 1000 MCG/ML IJ SOLN
1000.0000 ug | Freq: Once | INTRAMUSCULAR | Status: AC
  Administered 2021-09-20: 1000 ug via INTRAMUSCULAR
  Filled 2021-09-20: qty 1

## 2021-09-20 NOTE — Patient Instructions (Signed)
Vitamin B12 and Folate Test °Why am I having this test? °Vitamin B12 and folate (folic acid) are B vitamins needed to make red blood cells and keep your nervous system healthy. Vitamin B12 is in foods such as meats, eggs, dairy products, and fish. Folate is in fruits, beans, and leafy green vegetables. Some foods, such as whole grains, bread, and cereals have vitamin B12 added to them (are fortified). °You may not have enough of these B vitamins (have a deficiency) if your diet lacks these vitamins. Low levels can also be caused by diseases or having had surgeries on your stomach or small intestine that interfere with your ability to absorb the vitamins from your food. °You may have a vitamin B12 and folate test if: °You have symptoms of vitamin B12 or folate deficiency, such as tiredness (fatigue), headache, confusion, poor balance, or tingling and numbness in your hands and feet. °You are pregnant or breastfeeding. Women who are pregnant or breastfeeding need more folate and may need to take dietary supplements. °Your red blood cell count is low (anemia). °You are an older person and have mental confusion. °You have a disease or condition that may lead to a deficiency of these B vitamins. °What is being tested? °This test measures the amount of vitamin B12 and folate in your blood. The tests for vitamin B12 and folate may be done together or separately. °What kind of sample is taken? °A blood sample is required for this test. It is usually collected by inserting a needle into a blood vessel. °How do I prepare for this test? °Follow instructions from your health care provider about eating and drinking before the test. °Tell a health care provider about: °All medicines you are taking, including vitamins, herbs, eye drops, creams, and over-the-counter medicines. °Any medical conditions you have. °Whether you are pregnant or may be pregnant. °How often you drink alcohol. °How are the results reported? °Your test  results will be reported as values that identify the amount of vitamin B12 and folate in your blood. Your health care provider will compare your results to normal ranges that were established after testing a large group of people (reference ranges). Reference ranges may vary among labs and hospitals. For this test, common reference ranges are: °Vitamin B12: 160-950 pg/mL or 118-701 pmol/L (SI units). °Folate: 5-25 ng/mL or 11-57 nmol/L (SI units). °What do the results mean? °Results within the reference range are considered normal. Vitamin B12 or folate levels that are lower than the reference range may be caused by: °Poor nutrition or eating a vegetarian or vegan diet that does not include any foods that come from animals. °Having alcoholism. °Having certain diseases that make it hard to absorb vitamin B12. These diseases include Crohn's disease, chronic pancreatitis, and cystic fibrosis. °Taking certain medicines. °Having had surgeries on your stomach or small intestine. °High levels of vitamin B12 are rare, but they may happen if you have: °Cancer. °Liver disease. °High levels of folate may happen if: °You have anemia. °You are vegetarian. °You have had a recent blood transfusion. °Talk with your health care provider about what your results mean. °Questions to ask your health care provider °Ask your health care provider, or the department that is doing the test: °When will my results be ready? °How will I get my results? °What are my treatment options? °What other tests do I need? °What are my next steps? °Summary °Vitamin B12 and folate (folic acid) are both B vitamins that are needed to make   red blood cells and to keep your nervous system healthy. °You may not have enough B vitamins in your body if you do not get enough in your diet or if you have a disease that makes it hard to absorb vitamin B12. °This test measures the amount of vitamin B12 and folate in your blood. A blood sample is required for the  test. °Talk with your health care provider about what your results mean. °This information is not intended to replace advice given to you by your health care provider. Make sure you discuss any questions you have with your health care provider. °Document Revised: 05/23/2021 Document Reviewed: 05/23/2021 °Elsevier Patient Education © 2022 [employer] Inc. ° °

## 2021-09-24 ENCOUNTER — Ambulatory Visit (HOSPITAL_BASED_OUTPATIENT_CLINIC_OR_DEPARTMENT_OTHER)
Admission: RE | Admit: 2021-09-24 | Discharge: 2021-09-24 | Disposition: A | Discharge status: Home / Self Care | Payer: 59 | Attending: Orthopaedic Surgery | Admitting: Orthopaedic Surgery

## 2021-09-24 ENCOUNTER — Ambulatory Visit (HOSPITAL_BASED_OUTPATIENT_CLINIC_OR_DEPARTMENT_OTHER): Payer: 59 | Admitting: Certified Registered"

## 2021-09-24 ENCOUNTER — Encounter (HOSPITAL_BASED_OUTPATIENT_CLINIC_OR_DEPARTMENT_OTHER): Payer: Self-pay | Admitting: Orthopaedic Surgery

## 2021-09-24 ENCOUNTER — Encounter (HOSPITAL_BASED_OUTPATIENT_CLINIC_OR_DEPARTMENT_OTHER)
Admission: RE | Disposition: A | Discharge status: Home / Self Care | Payer: Self-pay | Source: Home / Self Care | Attending: Orthopaedic Surgery

## 2021-09-24 ENCOUNTER — Other Ambulatory Visit: Payer: Self-pay

## 2021-09-24 DIAGNOSIS — G5601 Carpal tunnel syndrome, right upper limb: Secondary | ICD-10-CM | POA: Diagnosis present

## 2021-09-24 DIAGNOSIS — F419 Anxiety disorder, unspecified: Secondary | ICD-10-CM | POA: Insufficient documentation

## 2021-09-24 DIAGNOSIS — I10 Essential (primary) hypertension: Secondary | ICD-10-CM | POA: Insufficient documentation

## 2021-09-24 HISTORY — PX: CARPAL TUNNEL RELEASE: SHX101

## 2021-09-24 LAB — POCT PREGNANCY, URINE: Preg Test, Ur: NEGATIVE

## 2021-09-24 SURGERY — CARPAL TUNNEL RELEASE
Anesthesia: Monitor Anesthesia Care | Site: Hand | Laterality: Right

## 2021-09-24 MED ORDER — AMISULPRIDE (ANTIEMETIC) 5 MG/2ML IV SOLN: 10.0000 mg | Freq: Once | INTRAVENOUS | Status: DC | PRN

## 2021-09-24 MED ORDER — FENTANYL CITRATE (PF) 100 MCG/2ML IJ SOLN: 25.0000 ug | INTRAMUSCULAR | Status: DC | PRN

## 2021-09-24 MED ORDER — MIDAZOLAM HCL 2 MG/2ML IJ SOLN
INTRAMUSCULAR | Status: AC
  Filled 2021-09-24: qty 2

## 2021-09-24 MED ORDER — 0.9 % SODIUM CHLORIDE (POUR BTL) OPTIME
TOPICAL | Status: DC | PRN
  Administered 2021-09-24: 13:00:00 150 mL

## 2021-09-24 MED ORDER — LACTATED RINGERS IV SOLN: INTRAVENOUS | Status: DC

## 2021-09-24 MED ORDER — ACETAMINOPHEN 325 MG PO TABS: 325.0000 mg | ORAL_TABLET | ORAL | Status: DC | PRN

## 2021-09-24 MED ORDER — DEXAMETHASONE SODIUM PHOSPHATE 10 MG/ML IJ SOLN
INTRAMUSCULAR | Status: AC
  Filled 2021-09-24: qty 2

## 2021-09-24 MED ORDER — CEFAZOLIN SODIUM-DEXTROSE 2-4 GM/100ML-% IV SOLN
2.0000 g | INTRAVENOUS | Status: AC
  Administered 2021-09-24: 13:00:00 2 g via INTRAVENOUS

## 2021-09-24 MED ORDER — CEFAZOLIN SODIUM-DEXTROSE 2-4 GM/100ML-% IV SOLN
INTRAVENOUS | Status: AC
  Filled 2021-09-24: qty 100

## 2021-09-24 MED ORDER — PROMETHAZINE HCL 25 MG/ML IJ SOLN: 6.2500 mg | INTRAMUSCULAR | Status: DC | PRN

## 2021-09-24 MED ORDER — LIDOCAINE 2% (20 MG/ML) 5 ML SYRINGE
INTRAMUSCULAR | Status: DC | PRN
  Administered 2021-09-24: 2 mg via INTRAVENOUS

## 2021-09-24 MED ORDER — OXYCODONE HCL 5 MG PO TABS: 5.0000 mg | ORAL_TABLET | Freq: Once | ORAL | Status: DC | PRN

## 2021-09-24 MED ORDER — HYDROCODONE-ACETAMINOPHEN 5-325 MG PO TABS
1.0000 | ORAL_TABLET | Freq: Every day | ORAL | 0 refills | Status: DC | PRN
Start: 2021-09-24 — End: 2021-10-01

## 2021-09-24 MED ORDER — ONDANSETRON HCL 4 MG/2ML IJ SOLN
INTRAMUSCULAR | Status: DC | PRN
  Administered 2021-09-24: 4 mg via INTRAVENOUS

## 2021-09-24 MED ORDER — LIDOCAINE HCL (CARDIAC) PF 100 MG/5ML IV SOSY
PREFILLED_SYRINGE | INTRAVENOUS | Status: DC | PRN
  Administered 2021-09-24: 60 mg via INTRAVENOUS

## 2021-09-24 MED ORDER — FENTANYL CITRATE (PF) 100 MCG/2ML IJ SOLN
INTRAMUSCULAR | Status: DC | PRN
  Administered 2021-09-24 (×2): 50 ug via INTRAVENOUS

## 2021-09-24 MED ORDER — DEXMEDETOMIDINE (PRECEDEX) IN NS 20 MCG/5ML (4 MCG/ML) IV SYRINGE
PREFILLED_SYRINGE | INTRAVENOUS | Status: DC | PRN
  Administered 2021-09-24: 12 ug via INTRAVENOUS

## 2021-09-24 MED ORDER — OXYCODONE HCL 5 MG/5ML PO SOLN: 5.0000 mg | Freq: Once | ORAL | Status: DC | PRN

## 2021-09-24 MED ORDER — LIDOCAINE-EPINEPHRINE (PF) 1 %-1:200000 IJ SOLN
INTRAMUSCULAR | Status: DC | PRN
  Administered 2021-09-24: 6 mL

## 2021-09-24 MED ORDER — ONDANSETRON HCL 4 MG/2ML IJ SOLN
INTRAMUSCULAR | Status: AC
  Filled 2021-09-24: qty 10

## 2021-09-24 MED ORDER — ACETAMINOPHEN 160 MG/5ML PO SOLN: 325.0000 mg | ORAL | Status: DC | PRN

## 2021-09-24 MED ORDER — LIDOCAINE-EPINEPHRINE (PF) 1 %-1:200000 IJ SOLN
INTRAMUSCULAR | Status: AC
  Filled 2021-09-24: qty 30

## 2021-09-24 MED ORDER — PROPOFOL 500 MG/50ML IV EMUL
INTRAVENOUS | Status: DC | PRN
  Administered 2021-09-24: 75 ug/kg/min via INTRAVENOUS

## 2021-09-24 MED ORDER — ACETAMINOPHEN 10 MG/ML IV SOLN: 1000.0000 mg | Freq: Once | INTRAVENOUS | Status: DC | PRN

## 2021-09-24 MED ORDER — FENTANYL CITRATE (PF) 100 MCG/2ML IJ SOLN
INTRAMUSCULAR | Status: AC
  Filled 2021-09-24: qty 2

## 2021-09-24 SURGICAL SUPPLY — 49 items
BAND INSRT 18 STRL LF DISP RB (MISCELLANEOUS) ×2
BAND RUBBER #18 3X1/16 STRL (MISCELLANEOUS) ×4 IMPLANT
BLADE MINI RND TIP GREEN BEAV (BLADE) ×2 IMPLANT
BLADE SURG 15 STRL LF DISP TIS (BLADE) ×1 IMPLANT
BLADE SURG 15 STRL SS (BLADE) ×2
BNDG CMPR 9X4 STRL LF SNTH (GAUZE/BANDAGES/DRESSINGS) ×1
BNDG ELASTIC 3X5.8 VLCR STR LF (GAUZE/BANDAGES/DRESSINGS) ×2 IMPLANT
BNDG ESMARK 4X9 LF (GAUZE/BANDAGES/DRESSINGS) ×2 IMPLANT
BNDG PLASTER X FAST 3X3 WHT LF (CAST SUPPLIES) IMPLANT
BNDG PLSTR 9X3 FST ST WHT (CAST SUPPLIES)
BRUSH SCRUB EZ PLAIN DRY (MISCELLANEOUS) ×2 IMPLANT
CANISTER SUCT 1200ML W/VALVE (MISCELLANEOUS) ×2 IMPLANT
CORD BIPOLAR FORCEPS 12FT (ELECTRODE) ×2 IMPLANT
COVER BACK TABLE 60X90IN (DRAPES) ×2 IMPLANT
COVER MAYO STAND STRL (DRAPES) ×2 IMPLANT
CUFF TOURN SGL QUICK 18X4 (TOURNIQUET CUFF) IMPLANT
DECANTER SPIKE VIAL GLASS SM (MISCELLANEOUS) IMPLANT
DRAPE EXTREMITY T 121X128X90 (DISPOSABLE) ×2 IMPLANT
DRAPE IMP U-DRAPE 54X76 (DRAPES) ×2 IMPLANT
DRAPE SURG 17X23 STRL (DRAPES) ×1 IMPLANT
DRAPE U-SHAPE 47X51 STRL (DRAPES) ×1 IMPLANT
GAUZE 4X4 16PLY ~~LOC~~+RFID DBL (SPONGE) ×1 IMPLANT
GAUZE SPONGE 4X4 12PLY STRL (GAUZE/BANDAGES/DRESSINGS) ×2 IMPLANT
GAUZE XEROFORM 1X8 LF (GAUZE/BANDAGES/DRESSINGS) ×2 IMPLANT
GLOVE SURG NEOP MICRO LF SZ7.5 (GLOVE) ×2 IMPLANT
GLOVE SURG POLYISO LF SZ6.5 (GLOVE) ×1 IMPLANT
GLOVE SURG SYN 7.5  E (GLOVE) ×2
GLOVE SURG SYN 7.5 E (GLOVE) ×1 IMPLANT
GLOVE SURG SYN 7.5 PF PI (GLOVE) ×1 IMPLANT
GLOVE SURG UNDER POLY LF SZ7 (GLOVE) ×5 IMPLANT
GLOVE SURG UNDER POLY LF SZ7.5 (GLOVE) ×2 IMPLANT
GOWN STRL REIN XL XLG (GOWN DISPOSABLE) ×4 IMPLANT
GOWN STRL REUS W/ TWL LRG LVL3 (GOWN DISPOSABLE) ×1 IMPLANT
GOWN STRL REUS W/TWL LRG LVL3 (GOWN DISPOSABLE) ×2
NDL HYPO 25X1 1.5 SAFETY (NEEDLE) IMPLANT
NEEDLE HYPO 25X1 1.5 SAFETY (NEEDLE) ×2 IMPLANT
NS IRRIG 1000ML POUR BTL (IV SOLUTION) ×2 IMPLANT
PACK BASIN DAY SURGERY FS (CUSTOM PROCEDURE TRAY) ×2 IMPLANT
PAD CAST 3X4 CTTN HI CHSV (CAST SUPPLIES) ×1 IMPLANT
PADDING CAST COTTON 3X4 STRL (CAST SUPPLIES) ×2
SHEET MEDIUM DRAPE 40X70 STRL (DRAPES) ×2 IMPLANT
STOCKINETTE 4X48 STRL (DRAPES) ×2 IMPLANT
SUT ETHILON 3 0 PS 1 (SUTURE) ×2 IMPLANT
SYR BULB EAR ULCER 3OZ GRN STR (SYRINGE) ×2 IMPLANT
SYR CONTROL 10ML LL (SYRINGE) ×1 IMPLANT
TOWEL GREEN STERILE FF (TOWEL DISPOSABLE) ×2 IMPLANT
TRAY DSU PREP LF (CUSTOM PROCEDURE TRAY) ×2 IMPLANT
TUBE CONNECTING 20X1/4 (TUBING) IMPLANT
UNDERPAD 30X36 HEAVY ABSORB (UNDERPADS AND DIAPERS) ×2 IMPLANT

## 2021-09-24 NOTE — Op Note (Signed)
° °  Carpal tunnel op note  DATE OF SURGERY:09/24/2021  PREOPERATIVE DIAGNOSIS:  Right carpal tunnel syndrome  POSTOPERATIVE DIAGNOSIS: same  PROCEDURE: Right carpal tunnel release. CPT 11572  SURGEON: Marianna Payment, M.D.  ASSIST: Madalyn Rob, [place]  ANESTHESIA:  Local and MAC  TOURNIQUET TIME: less than 20 minutes  BLOOD LOSS: Minimal.  COMPLICATIONS: None.  PATHOLOGY: None.  INDICATIONS: The patient is a 40 y.o. -year-old female who presented with carpal tunnel syndrome failing nonsurgical management, indicated for surgical release.  DESCRIPTION OF PROCEDURE: The patient was identified in the preoperative holding area.  The operative site was marked by the surgeon and confirmed by the patient.  The patient was brought back to the operating room.  MAC anesthesia was administered.  Local anesthetic with epi was injected into the operative site.  A well padded nonsterile tourniquet was placed. The operative extremity was prepped and draped in standard sterile fashion.  A timeout was performed.  Preoperative antibiotics were given.   A palmar incision was made about 5 mm ulnar to the thenar crease.  The palmar aponeurosis was exposed and divided in line with the skin incision. The palmaris brevis was visualized and divided.  The distal edge of the transcarpal ligament was identified. A hemostat was inserted into the carpal tunnel to protect the median nerve and the flexor tendons. Then, the transverse carpal ligament was released under direct visualization. Proximally, a subcutaneous tunnel was made allowing a [place] retractor to be placed. Then, the distal portion of the antebrachial fascia was released. Distally, all fibrous bands were released. The median nerve was visualized, and the fat pad was exposed. Wound was irrigated and closed with 4-0 nylon sutures. Sterile dressing applied. The patient was transferred to the recovery room in stable condition after all counts were  correct.  POSTOPERATIVE PLAN: To start nerve gliding exercises as tolerated and no heavy lifting for four weeks.  Eduard Roux, M.D. [facility] 1:11 PM

## 2021-09-24 NOTE — Discharge Instructions (Addendum)
 Postoperative instructions:  Weightbearing instructions: no lifting more than 10 lbs  Dressing instructions: Keep your dressing and/or splint clean and dry at all times.  It will be removed at your first post-operative appointment.  Your stitches and/or staples will be removed at this visit.  Incision instructions:  Do not soak your incision for 3 weeks after surgery.  If the incision gets wet, pat dry and do not scrub the incision.  Pain control:  You have been given a prescription to be taken as directed for post-operative pain control.  In addition, elevate the operative extremity above the heart at all times to prevent swelling and throbbing pain.  Take over-the-counter Colace, 100mg by mouth twice a day while taking narcotic pain medications to help prevent constipation.  Follow up appointments: 1) 7 days for wound check. 2) Dr. Xu as scheduled.   -------------------------------------------------------------------------------------------------------------  After Surgery Pain Control:  After your surgery, post-surgical discomfort or pain is likely. This discomfort can last several days to a few weeks. At certain times of the day your discomfort may be more intense.  Did you receive a nerve block?  A nerve block can provide pain relief for one hour to two days after your surgery. As long as the nerve block is working, you will experience little or no sensation in the area the surgeon operated on.  As the nerve block wears off, you will begin to experience pain or discomfort. It is very important that you begin taking your prescribed pain medication before the nerve block fully wears off. Treating your pain at the first sign of the block wearing off will ensure your pain is better controlled and more tolerable when full-sensation returns. Do not wait until the pain is intolerable, as the medicine will be less effective. It is better to treat pain in advance than to try and catch up.    General Anesthesia:  If you did not receive a nerve block during your surgery, you will need to start taking your pain medication shortly after your surgery and should continue to do so as prescribed by your surgeon.  Pain Medication:  Most commonly we prescribe Vicodin and Percocet for post-operative pain. Both of these medications contain a combination of acetaminophen (Tylenol) and a narcotic to help control pain.   It takes between 30 and 45 minutes before pain medication starts to work. It is important to take your medication before your pain level gets too intense.   Nausea is a common side effect of many pain medications. You will want to eat something before taking your pain medicine to help prevent nausea.   If you are taking a prescription pain medication that contains acetaminophen, we recommend that you do not take additional over the counter acetaminophen (Tylenol).  Other pain relieving options:   Using a cold pack to ice the affected area a few times a day (15 to 20 minutes at a time) can help to relieve pain, reduce swelling and bruising.   Elevation of the affected area can also help to reduce pain and swelling.   Post Anesthesia Home Care Instructions  Activity: Get plenty of rest for the remainder of the day. A responsible individual must stay with you for 24 hours following the procedure.  For the next 24 hours, DO NOT: -Drive a car -Operate machinery -Drink alcoholic beverages -Take any medication unless instructed by your physician -Make any legal decisions or sign important papers.  Meals: Start with liquid foods such as gelatin   or soup. Progress to regular foods as tolerated. Avoid greasy, spicy, heavy foods. If nausea and/or vomiting occur, drink only clear liquids until the nausea and/or vomiting subsides. Call your physician if vomiting continues.  Special Instructions/Symptoms: Your throat may feel dry or sore from the anesthesia or the breathing tube  placed in your throat during surgery. If this causes discomfort, gargle with warm salt water. The discomfort should disappear within 24 hours.  If you had a scopolamine patch placed behind your ear for the management of post- operative nausea and/or vomiting:  1. The medication in the patch is effective for 72 hours, after which it should be removed.  Wrap patch in a tissue and discard in the trash. Wash hands thoroughly with soap and water. 2. You may remove the patch earlier than 72 hours if you experience unpleasant side effects which may include dry mouth, dizziness or visual disturbances. 3. Avoid touching the patch. Wash your hands with soap and water after contact with the patch.    Regional Anesthesia Blocks  1. Numbness or the inability to move the "blocked" extremity may last from 3-48 hours after placement. The length of time depends on the medication injected and your individual response to the medication. If the numbness is not going away after 48 hours, call your surgeon.  2. The extremity that is blocked will need to be protected until the numbness is gone and the  Strength has returned. Because you cannot feel it, you will need to take extra care to avoid injury. Because it may be weak, you may have difficulty moving it or using it. You may not know what position it is in without looking at it while the block is in effect.  3. For blocks in the legs and feet, returning to weight bearing and walking needs to be done carefully. You will need to wait until the numbness is entirely gone and the strength has returned. You should be able to move your leg and foot normally before you try and bear weight or walk. You will need someone to be with you when you first try to ensure you do not fall and possibly risk injury.  4. Bruising and tenderness at the needle site are common side effects and will resolve in a few days.  5. Persistent numbness or new problems with movement should be  communicated to the surgeon or the [facility] ([phone])/ [facility] ([phone]).  

## 2021-09-24 NOTE — Anesthesia Preprocedure Evaluation (Addendum)
Anesthesia Evaluation  Patient identified by MRN, date of birth, ID band Patient awake    Reviewed: Allergy & Precautions, NPO status , Patient's Chart, lab work & pertinent test results  Airway Mallampati: I  TM Distance: >3 FB Neck ROM: Full    Dental  (+) Teeth Intact, Dental Advisory Given   Pulmonary neg pulmonary ROS,    breath sounds clear to auscultation       Cardiovascular hypertension,  Rhythm:Regular Rate:Normal     Neuro/Psych  Headaches, PSYCHIATRIC DISORDERS Anxiety    GI/Hepatic Neg liver ROS, GERD  ,  Endo/Other  negative endocrine ROS  Renal/GU negative Renal ROS     Musculoskeletal  (+) Arthritis ,   Abdominal Normal abdominal exam  (+)   Peds  Hematology negative hematology ROS (+)   Anesthesia Other Findings   Reproductive/Obstetrics                            Anesthesia Physical Anesthesia Plan  ASA: 2  Anesthesia Plan: MAC   Post-op Pain Management:    Induction: Intravenous  PONV Risk Score and Plan: 3 and Propofol infusion, Ondansetron and Midazolam  Airway Management Planned: Natural Airway and Simple Face Mask  Additional Equipment: None  Intra-op Plan:   Post-operative Plan:   Informed Consent: I have reviewed the patients History and Physical, chart, labs and discussed the procedure including the risks, benefits and alternatives for the proposed anesthesia with the patient or authorized representative who has indicated his/her understanding and acceptance.     Dental advisory given  Plan Discussed with: CRNA  Anesthesia Plan Comments:        Anesthesia Quick Evaluation

## 2021-09-24 NOTE — Anesthesia Postprocedure Evaluation (Signed)
Anesthesia Post Note  Patient: Meredith Byrd  Procedure(s) Performed: RIGHT CARPAL TUNNEL RELEASE (Right: Hand)     Patient location during evaluation: PACU Anesthesia Type: MAC Level of consciousness: awake and alert Pain management: pain level controlled Vital Signs Assessment: post-procedure vital signs reviewed and stable Respiratory status: spontaneous breathing, nonlabored ventilation, respiratory function stable and patient connected to nasal cannula oxygen Cardiovascular status: stable and blood pressure returned to baseline Postop Assessment: no apparent nausea or vomiting Anesthetic complications: no   No notable events documented.  Last Vitals:  Vitals:   09/24/21 1345 09/24/21 1400  BP: 127/71 118/77  Pulse: 60 64  Resp: 16 16  Temp:  36.7 C  SpO2: 100% 100%    Last Pain:  Vitals:   09/24/21 1400  TempSrc:   PainSc: 0-No pain                 Effie Berkshire

## 2021-09-24 NOTE — H&P (Signed)
PREOPERATIVE H&P  Chief Complaint: right carpal tunnel syndrome  HPI: Meredith Byrd is a 40 y.o. female who presents for surgical treatment of right carpal tunnel syndrome.  She denies any changes in medical history.  Past Medical History:  Diagnosis Date   Allergy    Anemia    Anxiety    Arthritis pain 08/20/2014   Back pain    COVID-19    GERD (gastroesophageal reflux disease)    History of stress test 02/2011 ([facility])   there was no evidence of ischemia, but she only went 3 1/2 minutes on the treadmill making it very difficult to get a good accurate assessment, however   Hx of echocardiogram    The echocadiogram was essentially normal with the exception of mild mitral calcification and borderline concentric LVH, which in the setting of her hypertension at this early age is something that mean her blood pressure is well controlled.    Hypertension    Iron deficiency anemia, unspecified 07/09/2014   Migraines    Palpitations    Swelling    Vitamin B12 deficiency 03/17/2016   Past Surgical History:  Procedure Laterality Date   BREAST SURGERY     CHOLECYSTECTOMY     GASTRIC BYPASS  2008   OOPHORECTOMY Right 2010   REDUCTION MAMMAPLASTY Bilateral    Social History   Socioeconomic History   Marital status: Married    Spouse name: Damon   Number of children: 4   Years of education: Associates   Highest education level: Not on file  Occupational History   Occupation: Insurance Claims  Tobacco Use   Smoking status: Never   Smokeless tobacco: Never  Vaping Use   Vaping Use: Never used  Substance and Sexual Activity   Alcohol use: No   Drug use: No   Sexual activity: Yes  Other Topics Concern   Not on file  Social History Narrative   Right handed.   Lives at home with husband and four children.   No caffeine use.   Social Determinants of Health   Financial Resource Strain: Not on file  Food Insecurity: Not on file  Transportation Needs: Not on file   Physical Activity: Not on file  Stress: Not on file  Social Connections: Not on file   Family History  Problem Relation Age of Onset   Asthma Mother    Heart disease Mother    Diabetes Mother    Early death Mother    Hypertension Mother    Thyroid disease Mother    Anxiety disorder Mother    Hypertension Father    Heart disease Father    Breast cancer Sister 65   Breast cancer Maternal Aunt        under 12   Breast cancer Paternal Aunt    Stroke Maternal Grandmother    Heart disease Maternal Grandmother    Hypertension Maternal Grandmother    Hyperlipidemia Maternal Grandmother    Breast cancer Maternal Grandmother    Allergies  Allergen Reactions   Doxycycline Nausea And Vomiting   Fioricet [Butalbital-Apap-Caffeine] Other (See Comments)    GI upset   Inderal [Propranolol] Nausea Only    Dropped BP too low   Nsaids Nausea Only   Prior to Admission medications   Medication Sig Start Date End Date Taking? Authorizing Provider  cloNIDine (CATAPRES) 0.2 MG tablet Take 0.4 mg by mouth at bedtime. 05/20/20  Yes [provider]  Fremanezumab-vfrm (AJOVY) 225 MG/1.5ML SOAJ Inject 225 mg  into the skin every 30 (thirty) days. 09/12/21  Yes Butch Penny, NP  levocetirizine (XYZAL) 5 MG tablet Take 5 mg by mouth every evening.   Yes [provider]  medroxyPROGESTERone (DEPO-PROVERA) 150 MG/ML injection    Yes [provider]  traZODone (DESYREL) 100 MG tablet  12/16/20  Yes [provider]  Ubrogepant (UBRELVY) 100 MG TABS Take 1 tablet at the onset of migraine. Can repeat in 2 hours if needed. Only 2 tabs/24 hours 09/12/21  Yes Millikan, Aundra Millet, NP  acetaminophen (TYLENOL) 500 MG tablet Take 2 tablets (1,000 mg total) by mouth every 6 (six) hours as needed. 10/31/18   Wieters, Hallie C, PA-C  albuterol (VENTOLIN HFA) 108 (90 Base) MCG/ACT inhaler Inhale 1-2 puffs into the lungs every 6 (six) hours as needed for wheezing or shortness of breath.  06/26/20   Particia Nearing, PA-C  clonazePAM (KLONOPIN) 2 MG tablet Take 2 mg by mouth daily as needed. 09/12/20   [provider]  diazepam (VALIUM) 5 MG tablet Take 1 by mouth 1 hour  pre-procedure with very light food. May bring 2nd tablet to appointment. 05/22/20   Tyrell Antonio, MD  Docusate Sodium (DSS) 100 MG CAPS docusate sodium 100 mg capsule  TAKE 1 CAPSULE BY MOUTH TWICE A DAY    [provider]  hydrocortisone (ANUSOL-HC) 2.5 % rectal cream Place 1 application rectally 2 (two) times daily. 06/26/20   Particia Nearing, PA-C  ondansetron (ZOFRAN ODT) 4 MG disintegrating tablet Take 1 tablet (4 mg total) by mouth every 4 (four) hours as needed for nausea or vomiting. 02/18/20   Arby Barrette, MD  tiZANidine (ZANAFLEX) 4 MG tablet TAKE 1 TABLET BY MOUTH TWICE A DAY AS NEEDED FOR MUSCLE SPASMS 09/16/18   [place], Betty G, MD  traMADol (ULTRAM) 50 MG tablet Take 1-2 tablets (50-100 mg total) by mouth daily as needed. 06/06/21   Tarry Kos, MD     Positive ROS: All other systems have been reviewed and were otherwise negative with the exception of those mentioned in the HPI and as above.  Physical Exam: General: Alert, no acute distress Cardiovascular: No pedal edema Respiratory: No cyanosis, no use of accessory musculature GI: abdomen soft Skin: No lesions in the area of chief complaint Neurologic: Sensation intact distally Psychiatric: Patient is competent for consent with normal mood and affect Lymphatic: no lymphedema  MUSCULOSKELETAL: exam stable  Assessment: right carpal tunnel syndrome  Plan: Plan for Procedure(s): RIGHT CARPAL TUNNEL RELEASE  The risks benefits and alternatives were discussed with the patient including but not limited to the risks of nonoperative treatment, versus surgical intervention including infection, bleeding, nerve injury,  blood clots, cardiopulmonary complications, morbidity, mortality, among others, and they were  willing to proceed.   Preoperative templating of the joint replacement has been completed, documented, and submitted to the Operating Room personnel in order to optimize intra-operative equipment management.   Glee Arvin, MD 09/24/2021 10:53 AM

## 2021-09-24 NOTE — Transfer of Care (Signed)
Immediate Anesthesia Transfer of Care Note  Patient: Meredith Byrd  Procedure(s) Performed: RIGHT CARPAL TUNNEL RELEASE (Right: Hand)  Patient Location: PACU  Anesthesia Type:MAC  Level of Consciousness: awake, alert , oriented and patient cooperative  Airway & Oxygen Therapy: Patient Spontanous Breathing and Patient connected to face mask oxygen  Post-op Assessment: Report given to RN and Post -op Vital signs reviewed and stable  Post vital signs: Reviewed and stable  Last Vitals:  Vitals Value Taken Time  BP    Temp    Pulse    Resp 21 09/24/21 1321  SpO2    Vitals shown include unvalidated device data.  Last Pain:  Vitals:   09/24/21 1047  TempSrc: Oral  PainSc: 5       Patients Stated Pain Goal: 2 (01/00/71 2197)  Complications: No notable events documented.

## 2021-09-24 NOTE — Anesthesia Procedure Notes (Signed)
Procedure Name: MAC Date/Time: 09/24/2021 1:04 PM Performed by: Signe Colt, CRNA Pre-anesthesia Checklist: Patient identified, Emergency Drugs available, Suction available, Patient being monitored and Timeout performed Patient Re-evaluated:Patient Re-evaluated prior to induction Oxygen Delivery Method: Simple face mask

## 2021-09-25 ENCOUNTER — Encounter (HOSPITAL_BASED_OUTPATIENT_CLINIC_OR_DEPARTMENT_OTHER): Payer: Self-pay | Admitting: Orthopaedic Surgery

## 2021-10-01 ENCOUNTER — Encounter: Payer: Self-pay | Admitting: Orthopaedic Surgery

## 2021-10-01 ENCOUNTER — Other Ambulatory Visit: Payer: Self-pay

## 2021-10-01 ENCOUNTER — Ambulatory Visit (INDEPENDENT_AMBULATORY_CARE_PROVIDER_SITE_OTHER): Payer: 59 | Admitting: Orthopaedic Surgery

## 2021-10-01 DIAGNOSIS — Z9889 Other specified postprocedural states: Secondary | ICD-10-CM

## 2021-10-01 DIAGNOSIS — G5601 Carpal tunnel syndrome, right upper limb: Secondary | ICD-10-CM

## 2021-10-01 MED ORDER — HYDROCODONE-ACETAMINOPHEN 5-325 MG PO TABS: 1.0000 | ORAL_TABLET | Freq: Three times a day (TID) | ORAL | 0 refills | Status: DC | PRN

## 2021-10-01 NOTE — Progress Notes (Signed)
Post-Op Visit Note   Patient: Meredith Byrd           Date of Birth: Nov 14, 1980           MRN: [id number] Visit Date: 10/01/2021 PCP: Irena Reichmann, DO   Assessment & Plan:  Chief Complaint:  Chief Complaint  Patient presents with   Right Hand - Routine Post Op   Visit Diagnoses:  1. Right carpal tunnel syndrome   2. S/P carpal tunnel release     Plan: Patient is a pleasant 40 year old who comes in today 1 week status post right carpal tunnel release for moderate compression median nerve, date of surgery 09/24/2021.  She has been doing well.  She does note some decrease sensation to the ring and small fingers.  She has been taking hydrocodone which does seem to help.  Examination of her right wrist reveals a well-healing surgical incision with nylon sutures in place.  No evidence of infection or cellulitis.  Fingers are warm well perfused.  She is neurovascular intact distally.  Today, the wound was cleaned and recovered.  She was placed in a Velcro splint for which she will wear at all times for the next week.  No heavy lifting or submerging her hand underwater for another 3 weeks.  Follow-up with [place] next week for suture removal.  Call with concerns or questions in meantime.  Follow-Up Instructions: Return in about 1 week (around 10/08/2021).   Orders:  No orders of the defined types were placed in this encounter.  No orders of the defined types were placed in this encounter.   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   On Depo-Provera for contraception 05/31/2019   Chronic insomnia 05/17/2019   Lumbar facet arthropathy 12/14/2018   Dysmenorrhea, unspecified 05/16/2018   Knee pain, left 11/17/2017   Generalized anxiety disorder 10/04/2017    Morning headache 05/04/2017   Daytime somnolence 05/04/2017   Class 3 obesity with body mass index (BMI) of 45.0 to 49.9 in adult 03/28/2017   Chronic low back pain 01/19/2017   Vitamin D deficiency 01/18/2017   Vitamin B12 deficiency 03/17/2016   Beta thalassemia trait 03/25/2015   Intractable migraine without aura and without status migrainosus 11/23/2014   Elevated sed rate 08/20/2014   Arthritis pain 08/20/2014   Iron deficiency anemia 07/09/2014   Symptomatic anemia 06/05/2014   Chest pain 05/01/2014   GERD (gastroesophageal reflux disease) 03/22/2014   S/P gastric bypass 03/22/2014   Insomnia 03/22/2014   Migraine 03/22/2014   Palpitations 03/22/2014   Past Medical History:  Diagnosis Date   Allergy    Anemia    Anxiety    Arthritis pain 08/20/2014   Back pain    COVID-19    GERD (gastroesophageal reflux disease)    History of stress test 02/2011 ([facility])   there was no evidence of ischemia, but she only went 3 1/2 minutes on the treadmill making it very difficult to get a good accurate assessment, however   Hx of echocardiogram    The echocadiogram was essentially normal with the exception of mild mitral calcification and borderline concentric LVH, which in the setting of her hypertension at this early age is something that mean her blood pressure is well controlled.    Hypertension  Iron deficiency anemia, unspecified 07/09/2014   Migraines    Palpitations    Swelling    Vitamin B12 deficiency 03/17/2016    Family History  Problem Relation Age of Onset   Asthma Mother    Heart disease Mother    Diabetes Mother    Early death Mother    Hypertension Mother    Thyroid disease Mother    Anxiety disorder Mother    Hypertension Father    Heart disease Father    Breast cancer Sister 63   Breast cancer Maternal Aunt        under 47   Breast cancer Paternal Aunt    Stroke Maternal Grandmother    Heart disease Maternal Grandmother    Hypertension Maternal  Grandmother    Hyperlipidemia Maternal Grandmother    Breast cancer Maternal Grandmother     Past Surgical History:  Procedure Laterality Date   BREAST SURGERY     CARPAL TUNNEL RELEASE Right 09/24/2021   Procedure: RIGHT CARPAL TUNNEL RELEASE;  Surgeon: Tarry Kos, MD;  Location: [facility];  Service: Orthopedics;  Laterality: Right;   CHOLECYSTECTOMY     GASTRIC BYPASS  2008   OOPHORECTOMY Right 2010   REDUCTION MAMMAPLASTY Bilateral    Social History   Occupational History   Occupation: Human resources officer  Tobacco Use   Smoking status: Never   Smokeless tobacco: Never  Vaping Use   Vaping Use: Never used  Substance and Sexual Activity   Alcohol use: No   Drug use: No   Sexual activity: Yes

## 2021-10-03 ENCOUNTER — Inpatient Hospital Stay: Payer: 59

## 2021-10-03 ENCOUNTER — Other Ambulatory Visit: Payer: Self-pay

## 2021-10-03 DIAGNOSIS — D509 Iron deficiency anemia, unspecified: Secondary | ICD-10-CM | POA: Diagnosis not present

## 2021-10-03 DIAGNOSIS — E538 Deficiency of other specified B group vitamins: Secondary | ICD-10-CM

## 2021-10-03 DIAGNOSIS — D508 Other iron deficiency anemias: Secondary | ICD-10-CM

## 2021-10-03 MED ORDER — CYANOCOBALAMIN 1000 MCG/ML IJ SOLN
1000.0000 ug | Freq: Once | INTRAMUSCULAR | Status: AC
  Administered 2021-10-03: 15:00:00 1000 ug via INTRAMUSCULAR
  Filled 2021-10-03: qty 1

## 2021-10-08 ENCOUNTER — Ambulatory Visit: Payer: 59

## 2021-10-08 ENCOUNTER — Encounter: Payer: 59 | Admitting: Orthopaedic Surgery

## 2021-10-08 ENCOUNTER — Other Ambulatory Visit: Payer: Self-pay

## 2021-10-09 ENCOUNTER — Encounter: Payer: Self-pay | Admitting: Hematology and Oncology

## 2021-10-15 ENCOUNTER — Encounter: Payer: Self-pay | Admitting: Hematology and Oncology

## 2021-10-16 ENCOUNTER — Other Ambulatory Visit: Payer: Self-pay

## 2021-10-16 ENCOUNTER — Encounter: Payer: Self-pay | Admitting: Orthopaedic Surgery

## 2021-10-16 ENCOUNTER — Ambulatory Visit (INDEPENDENT_AMBULATORY_CARE_PROVIDER_SITE_OTHER): Payer: Managed Care, Other (non HMO) | Admitting: Orthopaedic Surgery

## 2021-10-16 DIAGNOSIS — Z9889 Other specified postprocedural states: Secondary | ICD-10-CM

## 2021-10-16 MED ORDER — MUPIROCIN 2 % EX OINT: 1.0000 "application " | TOPICAL_OINTMENT | Freq: Every day | CUTANEOUS | 2 refills | Status: DC

## 2021-10-16 NOTE — Progress Notes (Signed)
Post-Op Visit Note   Patient: Meredith Byrd           Date of Birth: 1981/03/01           MRN: [id number] Visit Date: 10/16/2021 PCP: Irena Reichmann, DO   Assessment & Plan:  Chief Complaint:  Chief Complaint  Patient presents with   Right Hand - Pain   Visit Diagnoses:  1. S/P carpal tunnel release     Plan: Meredith Byrd comes back today just for a wound check status post right carpal tunnel release.  The incision opened up a little bit which is consistent with postsurgical changes with the glabra skin.  There is no evidence of infection.  No neurovascular compromise to the hand.  Reassurance was provided that this is a normal appearance.  I recommend placing mupirocin ointment with a Band-Aid daily.  I would expect this to heal up over the next couple weeks.  Recheck in 3 weeks.  Follow-Up Instructions: Return in about 3 weeks (around 11/06/2021).   Orders:  No orders of the defined types were placed in this encounter.  Meds ordered this encounter  Medications   mupirocin ointment (BACTROBAN) 2 %    Sig: Apply 1 application topically daily.    Dispense:  22 g    Refill:  2    Imaging: No results found.  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   On Depo-Provera for contraception 05/31/2019   Chronic insomnia 05/17/2019   Lumbar facet arthropathy 12/14/2018   Dysmenorrhea, unspecified 05/16/2018   Knee pain, left 11/17/2017   Generalized anxiety disorder 10/04/2017   Morning headache 05/04/2017   Daytime somnolence 05/04/2017   Class 3 obesity with body mass index (BMI) of 45.0 to 49.9 in adult 03/28/2017   Chronic low back pain 01/19/2017   Vitamin D deficiency 01/18/2017   Vitamin B12 deficiency 03/17/2016   Beta thalassemia trait 03/25/2015   Intractable  migraine without aura and without status migrainosus 11/23/2014   Elevated sed rate 08/20/2014   Arthritis pain 08/20/2014   Iron deficiency anemia 07/09/2014   Symptomatic anemia 06/05/2014   Chest pain 05/01/2014   GERD (gastroesophageal reflux disease) 03/22/2014   S/P gastric bypass 03/22/2014   Insomnia 03/22/2014   Migraine 03/22/2014   Palpitations 03/22/2014   Past Medical History:  Diagnosis Date   Allergy    Anemia    Anxiety    Arthritis pain 08/20/2014   Back pain    COVID-19    GERD (gastroesophageal reflux disease)    History of stress test 02/2011 ([facility])   there was no evidence of ischemia, but she only went 3 1/2 minutes on the treadmill making it very difficult to get a good accurate assessment, however   Hx of echocardiogram    The echocadiogram was essentially normal with the exception of mild mitral calcification and borderline concentric LVH, which in the setting of her hypertension at this early age is something that mean her blood pressure is well controlled.    Hypertension    Iron deficiency anemia, unspecified 07/09/2014   Migraines    Palpitations    Swelling    Vitamin B12 deficiency 03/17/2016    Family History  Problem Relation Age of Onset   Asthma Mother    Heart disease Mother  Diabetes Mother    Early death Mother    Hypertension Mother    Thyroid disease Mother    Anxiety disorder Mother    Hypertension Father    Heart disease Father    Breast cancer Sister 81   Breast cancer Maternal Aunt        under 59   Breast cancer Paternal Aunt    Stroke Maternal Grandmother    Heart disease Maternal Grandmother    Hypertension Maternal Grandmother    Hyperlipidemia Maternal Grandmother    Breast cancer Maternal Grandmother     Past Surgical History:  Procedure Laterality Date   BREAST SURGERY     CARPAL TUNNEL RELEASE Right 09/24/2021   Procedure: RIGHT CARPAL TUNNEL RELEASE;  Surgeon: Tarry Kos, MD;  Location: [facility];  Service: Orthopedics;  Laterality: Right;   CHOLECYSTECTOMY     GASTRIC BYPASS  2008   OOPHORECTOMY Right 2010   REDUCTION MAMMAPLASTY Bilateral    Social History   Occupational History   Occupation: Human resources officer  Tobacco Use   Smoking status: Never   Smokeless tobacco: Never  Vaping Use   Vaping Use: Never used  Substance and Sexual Activity   Alcohol use: No   Drug use: No   Sexual activity: Yes

## 2021-10-17 ENCOUNTER — Inpatient Hospital Stay: Payer: Commercial Managed Care - HMO

## 2021-10-18 ENCOUNTER — Inpatient Hospital Stay: Payer: Commercial Managed Care - HMO | Attending: Hematology and Oncology

## 2021-10-18 ENCOUNTER — Other Ambulatory Visit: Payer: Self-pay

## 2021-10-18 VITALS — BP 110/62 | HR 61 | Temp 98.1°F | Resp 16

## 2021-10-18 DIAGNOSIS — D509 Iron deficiency anemia, unspecified: Secondary | ICD-10-CM | POA: Insufficient documentation

## 2021-10-18 DIAGNOSIS — E538 Deficiency of other specified B group vitamins: Secondary | ICD-10-CM | POA: Insufficient documentation

## 2021-10-18 DIAGNOSIS — Z79899 Other long term (current) drug therapy: Secondary | ICD-10-CM | POA: Diagnosis not present

## 2021-10-18 DIAGNOSIS — D508 Other iron deficiency anemias: Secondary | ICD-10-CM

## 2021-10-18 MED ORDER — CYANOCOBALAMIN 1000 MCG/ML IJ SOLN
1000.0000 ug | Freq: Once | INTRAMUSCULAR | Status: AC
  Administered 2021-10-18: 1000 ug via INTRAMUSCULAR
  Filled 2021-10-18: qty 1

## 2021-10-18 NOTE — Patient Instructions (Signed)
Vitamin B12 and Folate Test °Why am I having this test? °Vitamin B12 and folate (folic acid) are B vitamins needed to make red blood cells and keep your nervous system healthy. Vitamin B12 is in foods such as meats, eggs, dairy products, and fish. Folate is in fruits, beans, and leafy green vegetables. Some foods, such as whole grains, bread, and cereals have vitamin B12 added to them (are fortified). °You may not have enough of these B vitamins (have a deficiency) if your diet lacks these vitamins. Low levels can also be caused by diseases or having had surgeries on your stomach or small intestine that interfere with your ability to absorb the vitamins from your food. °You may have a vitamin B12 and folate test if: °You have symptoms of vitamin B12 or folate deficiency, such as tiredness (fatigue), headache, confusion, poor balance, or tingling and numbness in your hands and feet. °You are pregnant or breastfeeding. Women who are pregnant or breastfeeding need more folate and may need to take dietary supplements. °Your red blood cell count is low (anemia). °You are an older person and have mental confusion. °You have a disease or condition that may lead to a deficiency of these B vitamins. °What is being tested? °This test measures the amount of vitamin B12 and folate in your blood. The tests for vitamin B12 and folate may be done together or separately. °What kind of sample is taken? °A blood sample is required for this test. It is usually collected by inserting a needle into a blood vessel. °How do I prepare for this test? °Follow instructions from your health care provider about eating and drinking before the test. °Tell a health care provider about: °All medicines you are taking, including vitamins, herbs, eye drops, creams, and over-the-counter medicines. °Any medical conditions you have. °Whether you are pregnant or may be pregnant. °How often you drink alcohol. °How are the results reported? °Your test  results will be reported as values that identify the amount of vitamin B12 and folate in your blood. Your health care provider will compare your results to normal ranges that were established after testing a large group of people (reference ranges). Reference ranges may vary among labs and hospitals. For this test, common reference ranges are: °Vitamin B12: 160-950 pg/mL or 118-701 pmol/L (SI units). °Folate: 5-25 ng/mL or 11-57 nmol/L (SI units). °What do the results mean? °Results within the reference range are considered normal. Vitamin B12 or folate levels that are lower than the reference range may be caused by: °Poor nutrition or eating a vegetarian or vegan diet that does not include any foods that come from animals. °Having alcoholism. °Having certain diseases that make it hard to absorb vitamin B12. These diseases include Crohn's disease, chronic pancreatitis, and cystic fibrosis. °Taking certain medicines. °Having had surgeries on your stomach or small intestine. °High levels of vitamin B12 are rare, but they may happen if you have: °Cancer. °Liver disease. °High levels of folate may happen if: °You have anemia. °You are vegetarian. °You have had a recent blood transfusion. °Talk with your health care provider about what your results mean. °Questions to ask your health care provider °Ask your health care provider, or the department that is doing the test: °When will my results be ready? °How will I get my results? °What are my treatment options? °What other tests do I need? °What are my next steps? °Summary °Vitamin B12 and folate (folic acid) are both B vitamins that are needed to make   red blood cells and to keep your nervous system healthy. °You may not have enough B vitamins in your body if you do not get enough in your diet or if you have a disease that makes it hard to absorb vitamin B12. °This test measures the amount of vitamin B12 and folate in your blood. A blood sample is required for the  test. °Talk with your health care provider about what your results mean. °This information is not intended to replace advice given to you by your health care provider. Make sure you discuss any questions you have with your health care provider. °Document Revised: 05/23/2021 Document Reviewed: 05/23/2021 °Elsevier Patient Education © 2022 [employer] Inc. ° °

## 2021-10-31 ENCOUNTER — Ambulatory Visit: Payer: 59

## 2021-11-01 ENCOUNTER — Inpatient Hospital Stay: Payer: Commercial Managed Care - HMO

## 2021-11-01 ENCOUNTER — Other Ambulatory Visit: Payer: Self-pay

## 2021-11-01 VITALS — BP 129/83 | HR 83 | Temp 98.6°F | Resp 16

## 2021-11-01 DIAGNOSIS — D509 Iron deficiency anemia, unspecified: Secondary | ICD-10-CM | POA: Diagnosis not present

## 2021-11-01 DIAGNOSIS — D508 Other iron deficiency anemias: Secondary | ICD-10-CM

## 2021-11-01 DIAGNOSIS — E538 Deficiency of other specified B group vitamins: Secondary | ICD-10-CM

## 2021-11-01 MED ORDER — CYANOCOBALAMIN 1000 MCG/ML IJ SOLN
1000.0000 ug | Freq: Once | INTRAMUSCULAR | Status: AC
  Administered 2021-11-01: 1000 ug via INTRAMUSCULAR
  Filled 2021-11-01: qty 1

## 2021-11-07 ENCOUNTER — Other Ambulatory Visit: Payer: Self-pay

## 2021-11-07 ENCOUNTER — Encounter: Payer: Managed Care, Other (non HMO) | Admitting: Orthopaedic Surgery

## 2021-11-07 ENCOUNTER — Ambulatory Visit (INDEPENDENT_AMBULATORY_CARE_PROVIDER_SITE_OTHER): Payer: Managed Care, Other (non HMO) | Admitting: Orthopaedic Surgery

## 2021-11-07 ENCOUNTER — Encounter: Payer: Self-pay | Admitting: Orthopaedic Surgery

## 2021-11-07 DIAGNOSIS — Z9889 Other specified postprocedural states: Secondary | ICD-10-CM

## 2021-11-07 MED ORDER — CELECOXIB 200 MG PO CAPS: 200.0000 mg | ORAL_CAPSULE | Freq: Two times a day (BID) | ORAL | 3 refills | Status: DC

## 2021-11-07 NOTE — Progress Notes (Signed)
Post-Op Visit Note   Patient: Meredith Byrd           Date of Birth: Mar 04, 1981           MRN: [id number] Visit Date: 11/07/2021 PCP: Janie Morning, DO   Assessment & Plan:  Chief Complaint:  Chief Complaint  Patient presents with   Right Wrist - Follow-up    Right carpal tunnel release 09/24/2021   Visit Diagnoses:  1. S/P carpal tunnel release     Plan: Patient is approximately 6 weeks status post right carpal tunnel release.  She is coming in for her 6-week visit.  She feels weakness and making a fist and opening bottles and doors.  Feels like there is some tightness in the scar.  Prior carpal tunnel symptoms have essentially resolved.  Examination right hand shows fully healed surgical scar.  No neurovascular compromise.  She has some weakness to grip and lacks full extension of all the fingers secondary to discomfort.  From my standpoint I think she would benefit from hand therapy.  [facility].  Recommend moisturizing the hand and the surgical scar.  We will see her back as needed.  Follow-Up Instructions: No follow-ups on file.   Orders:  No orders of the defined types were placed in this encounter.  Meds ordered this encounter  Medications   celecoxib (CELEBREX) 200 MG capsule    Sig: Take 1 capsule (200 mg total) by mouth 2 (two) times daily.    Dispense:  30 capsule    Refill:  3    Imaging: No results found.  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   On Depo-Provera for contraception 05/31/2019   Chronic insomnia 05/17/2019   Lumbar facet arthropathy 12/14/2018   Dysmenorrhea, unspecified 05/16/2018   Knee pain, left 11/17/2017   Generalized anxiety disorder 10/04/2017   Morning headache 05/04/2017   Daytime  somnolence 05/04/2017   Class 3 obesity with body mass index (BMI) of 45.0 to 49.9 in adult 03/28/2017   Chronic low back pain 01/19/2017   Vitamin D deficiency 01/18/2017   Vitamin B12 deficiency 03/17/2016   Beta thalassemia trait 03/25/2015   Intractable migraine without aura and without status migrainosus 11/23/2014   Elevated sed rate 08/20/2014   Arthritis pain 08/20/2014   Iron deficiency anemia 07/09/2014   Symptomatic anemia 06/05/2014   Chest pain 05/01/2014   GERD (gastroesophageal reflux disease) 03/22/2014   S/P gastric bypass 03/22/2014   Insomnia 03/22/2014   Migraine 03/22/2014   Palpitations 03/22/2014   Past Medical History:  Diagnosis Date   Allergy    Anemia    Anxiety    Arthritis pain 08/20/2014   Back pain    COVID-19    GERD (gastroesophageal reflux disease)    History of stress test 02/2011 ([facility])   there was no evidence of ischemia, but she only went 3 1/2 minutes on the treadmill making it very difficult to get a good accurate assessment, however   Hx of echocardiogram    The echocadiogram was essentially normal with the exception of mild mitral calcification and borderline concentric LVH, which in the setting of her hypertension at this early age is something that mean her blood pressure is well controlled.    Hypertension  Iron deficiency anemia, unspecified 07/09/2014   Migraines    Palpitations    Swelling    Vitamin B12 deficiency 03/17/2016    Family History  Problem Relation Age of Onset   Asthma Mother    Heart disease Mother    Diabetes Mother    Early death Mother    Hypertension Mother    Thyroid disease Mother    Anxiety disorder Mother    Hypertension Father    Heart disease Father    Breast cancer Sister 68   Breast cancer Maternal Aunt        under 36   Breast cancer Paternal Aunt    Stroke Maternal Grandmother    Heart disease Maternal Grandmother    Hypertension Maternal Grandmother    Hyperlipidemia Maternal  Grandmother    Breast cancer Maternal Grandmother     Past Surgical History:  Procedure Laterality Date   BREAST SURGERY     CARPAL TUNNEL RELEASE Right 09/24/2021   Procedure: RIGHT CARPAL TUNNEL RELEASE;  Surgeon: Leandrew Koyanagi, MD;  Location: [place];  Service: Orthopedics;  Laterality: Right;   CHOLECYSTECTOMY     GASTRIC BYPASS  2008   OOPHORECTOMY Right 2010   REDUCTION MAMMAPLASTY Bilateral    Social History   Occupational History   Occupation: Diplomatic Services operational officer  Tobacco Use   Smoking status: Never   Smokeless tobacco: Never  Vaping Use   Vaping Use: Never used  Substance and Sexual Activity   Alcohol use: No   Drug use: No   Sexual activity: Yes

## 2021-11-14 ENCOUNTER — Inpatient Hospital Stay: Payer: Managed Care, Other (non HMO)

## 2021-11-14 ENCOUNTER — Other Ambulatory Visit: Payer: Self-pay | Admitting: Hematology and Oncology

## 2021-11-14 ENCOUNTER — Inpatient Hospital Stay: Payer: Managed Care, Other (non HMO) | Attending: Hematology and Oncology

## 2021-11-14 ENCOUNTER — Other Ambulatory Visit: Payer: Self-pay

## 2021-11-14 DIAGNOSIS — E538 Deficiency of other specified B group vitamins: Secondary | ICD-10-CM

## 2021-11-14 DIAGNOSIS — D508 Other iron deficiency anemias: Secondary | ICD-10-CM

## 2021-11-14 DIAGNOSIS — Z79899 Other long term (current) drug therapy: Secondary | ICD-10-CM | POA: Diagnosis not present

## 2021-11-14 DIAGNOSIS — D509 Iron deficiency anemia, unspecified: Secondary | ICD-10-CM | POA: Diagnosis present

## 2021-11-14 LAB — CMP (CANCER CENTER ONLY)
ALT: 17 U/L (ref 0–44)
AST: 16 U/L (ref 15–41)
Albumin: 4.6 g/dL (ref 3.5–5.0)
Alkaline Phosphatase: 62 U/L (ref 38–126)
Anion gap: 11 (ref 5–15)
BUN: 6 mg/dL (ref 6–20)
CO2: 25 mmol/L (ref 22–32)
Calcium: 9.8 mg/dL (ref 8.9–10.3)
Chloride: 103 mmol/L (ref 98–111)
Creatinine: 0.62 mg/dL (ref 0.44–1.00)
GFR, Estimated: >60 mL/min (ref >60)
Glucose, Bld: 97 mg/dL (ref 70–99)
Potassium: 3.5 mmol/L (ref 3.5–5.1)
Sodium: 139 mmol/L (ref 135–145)
Total Bilirubin: 0.5 mg/dL (ref 0.3–1.2)
Total Protein: 8.5 g/dL — ABNORMAL HIGH (ref 6.5–8.1)

## 2021-11-14 LAB — CBC WITH DIFFERENTIAL (CANCER CENTER ONLY)
Abs Immature Granulocytes: 0.01 10*3/uL (ref 0.00–0.07)
Basophils Absolute: 0 10*3/uL (ref 0.0–0.1)
Basophils Relative: 0 %
Eosinophils Absolute: 0 10*3/uL (ref 0.0–0.5)
Eosinophils Relative: 0 %
HCT: 39.9 % (ref 36.0–46.0)
Hemoglobin: 12.5 g/dL (ref 12.0–15.0)
Immature Granulocytes: 0 %
Lymphocytes Relative: 33 %
Lymphs Abs: 1.5 10*3/uL (ref 0.7–4.0)
MCH: 24.5 pg — ABNORMAL LOW (ref 26.0–34.0)
MCHC: 31.3 g/dL (ref 30.0–36.0)
MCV: 78.1 fL — ABNORMAL LOW (ref 80.0–100.0)
Monocytes Absolute: 0.3 10*3/uL (ref 0.1–1.0)
Monocytes Relative: 6 %
Neutro Abs: 2.8 10*3/uL (ref 1.7–7.7)
Neutrophils Relative %: 61 %
Platelet Count: 545 10*3/uL — ABNORMAL HIGH (ref 150–400)
RBC: 5.11 MIL/uL (ref 3.87–5.11)
RDW: 16.1 % — ABNORMAL HIGH (ref 11.5–15.5)
WBC Count: 4.6 10*3/uL (ref 4.0–10.5)
nRBC: 0 % (ref 0.0–0.2)

## 2021-11-14 LAB — RETIC PANEL
Immature Retic Fract: 25.3 % — ABNORMAL HIGH (ref 2.3–15.9)
RBC.: 5.06 MIL/uL (ref 3.87–5.11)
Retic Count, Absolute: 74.4 10*3/uL (ref 19.0–186.0)
Retic Ct Pct: 1.5 % (ref 0.4–3.1)
Reticulocyte Hemoglobin: 26.6 pg — ABNORMAL LOW (ref >27.9)

## 2021-11-14 LAB — IRON AND IRON BINDING CAPACITY (CC-WL,HP ONLY)
Iron: 49 ug/dL (ref 28–170)
Saturation Ratios: 12 % (ref 10.4–31.8)
TIBC: 424 ug/dL (ref 250–450)
UIBC: 375 ug/dL (ref 148–442)

## 2021-11-14 LAB — VITAMIN B12: Vitamin B-12: 403 pg/mL (ref 180–914)

## 2021-11-14 LAB — FERRITIN: Ferritin: 181 ng/mL (ref 11–307)

## 2021-11-14 MED ORDER — CYANOCOBALAMIN 1000 MCG/ML IJ SOLN
1000.0000 ug | Freq: Once | INTRAMUSCULAR | Status: AC
  Administered 2021-11-14: 1000 ug via INTRAMUSCULAR
  Filled 2021-11-14: qty 1

## 2021-11-28 ENCOUNTER — Ambulatory Visit: Payer: 59

## 2021-11-29 ENCOUNTER — Other Ambulatory Visit: Payer: Self-pay

## 2021-11-29 ENCOUNTER — Inpatient Hospital Stay: Payer: Managed Care, Other (non HMO)

## 2021-11-29 VITALS — BP 100/64 | HR 71 | Temp 98.1°F | Resp 16

## 2021-11-29 DIAGNOSIS — E538 Deficiency of other specified B group vitamins: Secondary | ICD-10-CM

## 2021-11-29 DIAGNOSIS — D508 Other iron deficiency anemias: Secondary | ICD-10-CM

## 2021-11-29 DIAGNOSIS — D509 Iron deficiency anemia, unspecified: Secondary | ICD-10-CM | POA: Diagnosis not present

## 2021-11-29 MED ORDER — CYANOCOBALAMIN 1000 MCG/ML IJ SOLN
1000.0000 ug | Freq: Once | INTRAMUSCULAR | Status: AC
  Administered 2021-11-29: 1000 ug via INTRAMUSCULAR
  Filled 2021-11-29: qty 1

## 2021-11-29 NOTE — Patient Instructions (Signed)
Vitamin B12 and Folate Test °Why am I having this test? °Vitamin B12 and folate (folic acid) are B vitamins needed to make red blood cells and keep your nervous system healthy. Vitamin B12 is in foods such as meats, eggs, dairy products, and fish. Folate is in fruits, beans, and leafy green vegetables. Some foods, such as whole grains, bread, and cereals have vitamin B12 added to them (are fortified). °You may not have enough of these B vitamins (have a deficiency) if your diet lacks these vitamins. Low levels can also be caused by diseases or having had surgeries on your stomach or small intestine that interfere with your ability to absorb the vitamins from your food. °You may have a vitamin B12 and folate test if: °You have symptoms of vitamin B12 or folate deficiency, such as tiredness (fatigue), headache, confusion, poor balance, or tingling and numbness in your hands and feet. °You are pregnant or breastfeeding. Women who are pregnant or breastfeeding need more folate and may need to take dietary supplements. °Your red blood cell count is low (anemia). °You are an older person and have mental confusion. °You have a disease or condition that may lead to a deficiency of these B vitamins. °What is being tested? °This test measures the amount of vitamin B12 and folate in your blood. The tests for vitamin B12 and folate may be done together or separately. °What kind of sample is taken? °A blood sample is required for this test. It is usually collected by inserting a needle into a blood vessel. °How do I prepare for this test? °Follow instructions from your health care provider about eating and drinking before the test. °Tell a health care provider about: °All medicines you are taking, including vitamins, herbs, eye drops, creams, and over-the-counter medicines. °Any medical conditions you have. °Whether you are pregnant or may be pregnant. °How often you drink alcohol. °How are the results reported? °Your test  results will be reported as values that identify the amount of vitamin B12 and folate in your blood. Your health care provider will compare your results to normal ranges that were established after testing a large group of people (reference ranges). Reference ranges may vary among labs and hospitals. For this test, common reference ranges are: °Vitamin B12: 160-950 pg/mL or 118-701 pmol/L (SI units). °Folate: 5-25 ng/mL or 11-57 nmol/L (SI units). °What do the results mean? °Results within the reference range are considered normal. Vitamin B12 or folate levels that are lower than the reference range may be caused by: °Poor nutrition or eating a vegetarian or vegan diet that does not include any foods that come from animals. °Having alcoholism. °Having certain diseases that make it hard to absorb vitamin B12. These diseases include Crohn's disease, chronic pancreatitis, and cystic fibrosis. °Taking certain medicines. °Having had surgeries on your stomach or small intestine. °High levels of vitamin B12 are rare, but they may happen if you have: °Cancer. °Liver disease. °High levels of folate may happen if: °You have anemia. °You are vegetarian. °You have had a recent blood transfusion. °Talk with your health care provider about what your results mean. °Questions to ask your health care provider °Ask your health care provider, or the department that is doing the test: °When will my results be ready? °How will I get my results? °What are my treatment options? °What other tests do I need? °What are my next steps? °Summary °Vitamin B12 and folate (folic acid) are both B vitamins that are needed to make   red blood cells and to keep your nervous system healthy. °You may not have enough B vitamins in your body if you do not get enough in your diet or if you have a disease that makes it hard to absorb vitamin B12. °This test measures the amount of vitamin B12 and folate in your blood. A blood sample is required for the  test. °Talk with your health care provider about what your results mean. °This information is not intended to replace advice given to you by your health care provider. Make sure you discuss any questions you have with your health care provider. °Document Revised: 05/23/2021 Document Reviewed: 05/23/2021 °Elsevier Patient Education © 2022 [employer] Inc. ° °

## 2021-12-03 ENCOUNTER — Institutional Professional Consult (permissible substitution): Payer: Managed Care, Other (non HMO) | Admitting: Emergency Medicine

## 2021-12-09 ENCOUNTER — Telehealth: Payer: Self-pay | Admitting: Orthopaedic Surgery

## 2021-12-09 ENCOUNTER — Other Ambulatory Visit: Payer: Self-pay

## 2021-12-09 DIAGNOSIS — G5601 Carpal tunnel syndrome, right upper limb: Secondary | ICD-10-CM

## 2021-12-09 DIAGNOSIS — Z9889 Other specified postprocedural states: Secondary | ICD-10-CM

## 2021-12-09 NOTE — Telephone Encounter (Signed)
faxed

## 2021-12-09 NOTE — Telephone Encounter (Signed)
Kasey from [facility] called requesting a copy of the referral sent over for patient. Please advise.

## 2021-12-12 ENCOUNTER — Ambulatory Visit: Payer: 59

## 2021-12-13 ENCOUNTER — Inpatient Hospital Stay: Payer: Commercial Managed Care - HMO | Attending: Hematology and Oncology

## 2021-12-13 ENCOUNTER — Other Ambulatory Visit: Payer: Self-pay

## 2021-12-13 VITALS — BP 100/65 | HR 82 | Temp 98.6°F | Resp 19 | Ht 60.0 in

## 2021-12-13 DIAGNOSIS — E538 Deficiency of other specified B group vitamins: Secondary | ICD-10-CM | POA: Diagnosis present

## 2021-12-13 DIAGNOSIS — D508 Other iron deficiency anemias: Secondary | ICD-10-CM

## 2021-12-13 DIAGNOSIS — D509 Iron deficiency anemia, unspecified: Secondary | ICD-10-CM | POA: Insufficient documentation

## 2021-12-13 DIAGNOSIS — Z79899 Other long term (current) drug therapy: Secondary | ICD-10-CM | POA: Diagnosis not present

## 2021-12-13 DIAGNOSIS — Z9884 Bariatric surgery status: Secondary | ICD-10-CM | POA: Insufficient documentation

## 2021-12-13 MED ORDER — CYANOCOBALAMIN 1000 MCG/ML IJ SOLN
1000.0000 ug | Freq: Once | INTRAMUSCULAR | Status: AC
  Administered 2021-12-13: 1000 ug via INTRAMUSCULAR

## 2021-12-18 ENCOUNTER — Institutional Professional Consult (permissible substitution): Payer: Managed Care, Other (non HMO) | Admitting: Emergency Medicine

## 2021-12-19 ENCOUNTER — Telehealth: Payer: Self-pay | Admitting: Orthopaedic Surgery

## 2021-12-19 NOTE — Telephone Encounter (Signed)
Pt submitted medical release form, short term disability forms, and $25.00 cash payment to [employer]. Accepted 12/19/21 ?

## 2021-12-23 ENCOUNTER — Telehealth: Payer: Self-pay | Admitting: *Deleted

## 2021-12-23 NOTE — Telephone Encounter (Signed)
Received fax from [employer]. Bernita Raisin has been approved 12/23/21-12/23/22. Faxed approval to pharmacy and notified patient. Received a receipt of confirmation. ? ?

## 2021-12-23 NOTE — Telephone Encounter (Signed)
Completed Bernita Raisin PA on Cover [employer] Meds. Key: [id number]. Awaiting [employer] determination.  ?

## 2021-12-26 ENCOUNTER — Ambulatory Visit: Payer: 59

## 2021-12-27 ENCOUNTER — Other Ambulatory Visit: Payer: Self-pay

## 2021-12-27 ENCOUNTER — Inpatient Hospital Stay: Payer: Commercial Managed Care - HMO

## 2021-12-27 VITALS — BP 108/67 | HR 87 | Temp 98.1°F | Resp 19

## 2021-12-27 DIAGNOSIS — D509 Iron deficiency anemia, unspecified: Secondary | ICD-10-CM | POA: Diagnosis not present

## 2021-12-27 DIAGNOSIS — E538 Deficiency of other specified B group vitamins: Secondary | ICD-10-CM

## 2021-12-27 DIAGNOSIS — D508 Other iron deficiency anemias: Secondary | ICD-10-CM

## 2021-12-27 MED ORDER — CYANOCOBALAMIN 1000 MCG/ML IJ SOLN
1000.0000 ug | Freq: Once | INTRAMUSCULAR | Status: AC
  Administered 2021-12-27: 1000 ug via INTRAMUSCULAR
  Filled 2021-12-27: qty 1

## 2021-12-27 NOTE — Patient Instructions (Signed)
Vitamin B12 Deficiency ?Vitamin B12 deficiency occurs when the body does not have enough vitamin B12, which is an important vitamin. The body needs this vitamin: ?To make red blood cells. ?To make DNA. This is the genetic material inside cells. ?To help the nerves work properly so they can carry messages from the brain to the body. ?Vitamin B12 deficiency can cause various health problems, such as a low red blood cell count (anemia) or nerve damage. ?What are the causes? ?This condition may be caused by: ?Not eating enough foods that contain vitamin B12. ?Not having enough stomach acid and digestive fluids to properly absorb vitamin B12 from the food that you eat. ?Certain digestive system diseases that make it hard to absorb vitamin B12. These diseases include Crohn's disease, chronic pancreatitis, and cystic fibrosis. ?A condition in which the body does not make enough of a protein (intrinsic factor), resulting in too few red blood cells (pernicious anemia). ?Having a surgery in which part of the stomach or small intestine is removed. ?Taking certain medicines that make it hard for the body to absorb vitamin B12. These medicines include: ?Heartburn medicines (antacids and proton pump inhibitors). ?Certain antibiotic medicines. ?Some medicines that are used to treat diabetes, tuberculosis, gout, or high cholesterol. ?What increases the risk? ?The following factors may make you more likely to develop a B12 deficiency: ?Being older than age 50. ?Eating a vegetarian or vegan diet, especially while you are pregnant. ?Eating a poor diet while you are pregnant. ?Taking certain medicines. ?Having alcoholism. ?What are the signs or symptoms? ?In some cases, there are no symptoms of this condition. If the condition leads to anemia or nerve damage, various symptoms can occur, such as: ?Weakness. ?Fatigue. ?Loss of appetite. ?Weight loss. ?Numbness or tingling in your hands and feet. ?Redness and burning of the  tongue. ?Confusion or memory problems. ?Depression. ?Sensory problems, such as color blindness, ringing in the ears, or loss of taste. ?Diarrhea or constipation. ?Trouble walking. ?If anemia is severe, symptoms can include: ?Shortness of breath. ?Dizziness. ?Rapid heart rate (tachycardia). ?How is this diagnosed? ?This condition may be diagnosed with a blood test to measure the level of vitamin B12 in your blood. You may also have other tests, including: ?A group of tests that measure certain characteristics of blood cells (complete blood count, CBC). ?A blood test to measure intrinsic factor. ?A procedure where a thin tube with a camera on the end is used to look into your stomach or intestines (endoscopy). ?Other tests may be needed to discover the cause of B12 deficiency. ?How is this treated? ?Treatment for this condition depends on the cause. This condition may be treated by: ?Changing your eating and drinking habits, such as: ?Eating more foods that contain vitamin B12. ?Drinking less alcohol or no alcohol. ?Getting vitamin B12 injections. ?Taking vitamin B12 supplements. Your health care provider will tell you which dosage is best for you. ?Follow these instructions at home: ?Eating and drinking ? ?Eat lots of healthy foods that contain vitamin B12, including: ?Meats and poultry. This includes beef, pork, chicken, turkey, and organ meats, such as liver. ?Seafood. This includes clams, rainbow trout, salmon, tuna, and haddock. ?Eggs. ?Cereal and dairy products that are fortified. This means that vitamin B12 has been added to the food. Check the label on the package to see if the food is fortified. ?The items listed above may not be a complete list of recommended foods and beverages. Contact a dietitian for more information. ?General instructions ?Get any   injections that are prescribed by your health care provider. ?Take supplements only as told by your health care provider. Follow the directions carefully. ?Do  not drink alcohol if your health care provider tells you not to. In some cases, you may only be asked to limit alcohol use. ?Keep all follow-up visits as told by your health care provider. This is important. ?Contact a health care provider if: ?Your symptoms come back. ?Get help right away if you: ?Develop shortness of breath. ?Have a rapid heart rate. ?Have chest pain. ?Become dizzy or lose consciousness. ?Summary ?Vitamin B12 deficiency occurs when the body does not have enough vitamin B12. ?The main causes of vitamin B12 deficiency include dietary deficiency, digestive diseases, pernicious anemia, and having a surgery in which part of the stomach or small intestine is removed. ?In some cases, there are no symptoms of this condition. If the condition leads to anemia or nerve damage, various symptoms can occur, such as weakness, shortness of breath, and numbness. ?Treatment may include getting vitamin B12 injections or taking vitamin B12 supplements. Eat lots of healthy foods that contain vitamin B12. ?This information is not intended to replace advice given to you by your health care provider. Make sure you discuss any questions you have with your health care provider. ?Document Revised: 03/26/2021 Document Reviewed: 06/07/2018 ?Elsevier Patient Education ? 2022 Elsevier Inc. ? ?

## 2022-01-08 ENCOUNTER — Other Ambulatory Visit: Payer: Self-pay | Admitting: Physician Assistant

## 2022-01-08 DIAGNOSIS — D508 Other iron deficiency anemias: Secondary | ICD-10-CM

## 2022-01-08 DIAGNOSIS — E538 Deficiency of other specified B group vitamins: Secondary | ICD-10-CM

## 2022-01-09 ENCOUNTER — Ambulatory Visit: Payer: Self-pay

## 2022-01-09 ENCOUNTER — Inpatient Hospital Stay: Payer: Commercial Managed Care - HMO

## 2022-01-09 ENCOUNTER — Inpatient Hospital Stay: Payer: Commercial Managed Care - HMO | Admitting: Hematology and Oncology

## 2022-01-09 ENCOUNTER — Ambulatory Visit (INDEPENDENT_AMBULATORY_CARE_PROVIDER_SITE_OTHER): Payer: Managed Care, Other (non HMO)

## 2022-01-09 ENCOUNTER — Inpatient Hospital Stay (HOSPITAL_BASED_OUTPATIENT_CLINIC_OR_DEPARTMENT_OTHER): Payer: Commercial Managed Care - HMO | Admitting: Physician Assistant

## 2022-01-09 ENCOUNTER — Other Ambulatory Visit: Payer: Self-pay

## 2022-01-09 ENCOUNTER — Telehealth: Payer: Self-pay | Admitting: Orthopaedic Surgery

## 2022-01-09 ENCOUNTER — Ambulatory Visit: Payer: Managed Care, Other (non HMO) | Admitting: Orthopaedic Surgery

## 2022-01-09 VITALS — BP 108/72 | HR 71 | Temp 98.8°F | Wt 179.9 lb

## 2022-01-09 DIAGNOSIS — R599 Enlarged lymph nodes, unspecified: Secondary | ICD-10-CM | POA: Diagnosis not present

## 2022-01-09 DIAGNOSIS — G8929 Other chronic pain: Secondary | ICD-10-CM | POA: Diagnosis not present

## 2022-01-09 DIAGNOSIS — M25511 Pain in right shoulder: Secondary | ICD-10-CM

## 2022-01-09 DIAGNOSIS — M542 Cervicalgia: Secondary | ICD-10-CM

## 2022-01-09 DIAGNOSIS — D509 Iron deficiency anemia, unspecified: Secondary | ICD-10-CM | POA: Diagnosis not present

## 2022-01-09 DIAGNOSIS — E559 Vitamin D deficiency, unspecified: Secondary | ICD-10-CM

## 2022-01-09 DIAGNOSIS — E538 Deficiency of other specified B group vitamins: Secondary | ICD-10-CM

## 2022-01-09 DIAGNOSIS — M25512 Pain in left shoulder: Secondary | ICD-10-CM | POA: Diagnosis not present

## 2022-01-09 DIAGNOSIS — D508 Other iron deficiency anemias: Secondary | ICD-10-CM

## 2022-01-09 LAB — CBC WITH DIFFERENTIAL (CANCER CENTER ONLY)
Abs Immature Granulocytes: 0.01 10*3/uL (ref 0.00–0.07)
Basophils Absolute: 0 10*3/uL (ref 0.0–0.1)
Basophils Relative: 0 %
Eosinophils Absolute: 0 10*3/uL (ref 0.0–0.5)
Eosinophils Relative: 0 %
HCT: 38.7 % (ref 36.0–46.0)
Hemoglobin: 12.1 g/dL (ref 12.0–15.0)
Immature Granulocytes: 0 %
Lymphocytes Relative: 10 %
Lymphs Abs: 0.6 10*3/uL — ABNORMAL LOW (ref 0.7–4.0)
MCH: 24.2 pg — ABNORMAL LOW (ref 26.0–34.0)
MCHC: 31.3 g/dL (ref 30.0–36.0)
MCV: 77.2 fL — ABNORMAL LOW (ref 80.0–100.0)
Monocytes Absolute: 0 10*3/uL — ABNORMAL LOW (ref 0.1–1.0)
Monocytes Relative: 1 %
Neutro Abs: 5.3 10*3/uL (ref 1.7–7.7)
Neutrophils Relative %: 89 %
Platelet Count: 544 10*3/uL — ABNORMAL HIGH (ref 150–400)
RBC: 5.01 MIL/uL (ref 3.87–5.11)
RDW: 15.5 % (ref 11.5–15.5)
WBC Count: 5.9 10*3/uL (ref 4.0–10.5)
nRBC: 0 % (ref 0.0–0.2)

## 2022-01-09 LAB — RETIC PANEL
Immature Retic Fract: 17.2 % — ABNORMAL HIGH (ref 2.3–15.9)
RBC.: 4.91 MIL/uL (ref 3.87–5.11)
Retic Count, Absolute: 56 10*3/uL (ref 19.0–186.0)
Retic Ct Pct: 1.1 % (ref 0.4–3.1)
Reticulocyte Hemoglobin: 26.7 pg — ABNORMAL LOW (ref >27.9)

## 2022-01-09 LAB — CMP (CANCER CENTER ONLY)
ALT: 18 U/L (ref 0–44)
AST: 16 U/L (ref 15–41)
Albumin: 4.5 g/dL (ref 3.5–5.0)
Alkaline Phosphatase: 58 U/L (ref 38–126)
Anion gap: 7 (ref 5–15)
BUN: 5 mg/dL — ABNORMAL LOW (ref 6–20)
CO2: 25 mmol/L (ref 22–32)
Calcium: 9.7 mg/dL (ref 8.9–10.3)
Chloride: 106 mmol/L (ref 98–111)
Creatinine: 0.6 mg/dL (ref 0.44–1.00)
GFR, Estimated: >60 mL/min (ref >60)
Glucose, Bld: 117 mg/dL — ABNORMAL HIGH (ref 70–99)
Potassium: 4 mmol/L (ref 3.5–5.1)
Sodium: 138 mmol/L (ref 135–145)
Total Bilirubin: 0.5 mg/dL (ref 0.3–1.2)
Total Protein: 8.5 g/dL — ABNORMAL HIGH (ref 6.5–8.1)

## 2022-01-09 LAB — IRON AND IRON BINDING CAPACITY (CC-WL,HP ONLY)
Iron: 67 ug/dL (ref 28–170)
Saturation Ratios: 15 % (ref 10.4–31.8)
TIBC: 461 ug/dL — ABNORMAL HIGH (ref 250–450)
UIBC: 394 ug/dL (ref 148–442)

## 2022-01-09 LAB — VITAMIN B12: Vitamin B-12: 447 pg/mL (ref 180–914)

## 2022-01-09 LAB — FERRITIN: Ferritin: 123 ng/mL (ref 11–307)

## 2022-01-09 LAB — VITAMIN D 25 HYDROXY (VIT D DEFICIENCY, FRACTURES): Vit D, 25-Hydroxy: 11.15 ng/mL — ABNORMAL LOW (ref 30–100)

## 2022-01-09 MED ORDER — METHYLPREDNISOLONE ACETATE 40 MG/ML IJ SUSP
40.0000 mg | INTRAMUSCULAR | Status: AC | PRN
  Administered 2022-01-09: 40 mg via INTRA_ARTICULAR

## 2022-01-09 MED ORDER — LIDOCAINE HCL 2 % IJ SOLN
2.0000 mL | INTRAMUSCULAR | Status: AC | PRN
  Administered 2022-01-09: 2 mL

## 2022-01-09 MED ORDER — ACETAMINOPHEN-CODEINE #3 300-30 MG PO TABS: 1.0000 | ORAL_TABLET | Freq: Two times a day (BID) | ORAL | 0 refills | Status: DC | PRN

## 2022-01-09 MED ORDER — CYANOCOBALAMIN 1000 MCG/ML IJ SOLN
1000.0000 ug | Freq: Once | INTRAMUSCULAR | Status: AC
  Administered 2022-01-09: 1000 ug via INTRAMUSCULAR
  Filled 2022-01-09: qty 1

## 2022-01-09 MED ORDER — BUPIVACAINE HCL 0.25 % IJ SOLN
2.0000 mL | INTRAMUSCULAR | Status: AC | PRN
  Administered 2022-01-09: 2 mL via INTRA_ARTICULAR

## 2022-01-09 NOTE — Progress Notes (Signed)
? [facility] ?[phone]   [phone] ? ?PROGRESS NOTE ? ?Patient Care Team: ?Irena Reichmann, DO as PCP - General (Family Medicine) ?Lavina Hamman, MD as Consulting Physician (Obstetrics and Gynecology) ?Jaci Standard, MD as Consulting Physician (Hematology and Oncology) ? 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 Leonides Schanz  ?12/20-12/27/2021: IV feraheme 510mg  q 7 days x 2 doses and 2 doses of IM Vitamin b12 .  ?12/26/2020: WBC 4.3, Hgb 11.8, MCV 79.4, Plt 432 ?01/09/2022: Wbc 5.9, Hgb 12.1, MCV 77.2, Plt 544 ?Interval History:  ?Meredith Byrd 41 y.o. female with medical history significant for iron deficiency anemia 2/2 to gastric bipass surgery who presents for a follow up visit. The patient's last visit was on 09/05/2021. In the interim since the last visit she has had no major changes in her health.  ? ?On exam today Mrs. Burpee reports persistent fatigue although she continues to complete all her daily activities on her own.  She reports her appetite is stable.  She denies any nausea, vomiting or abdominal pain.  She reports occasional episodes of constipation with a bowel movement every 2 to 3 days.  She reports enlarged lymph node in the right submandibular region that she feels is getting bigger in the past year.  It is nontender but she adds that she has had night sweats for the last 2 years, currently on clonidine.  She denies fevers, chills, shortness of breath, chest pain or cough.She is had no other changes in her health at this time.  A full 10 point ROS is listed below. ? ?MEDICAL HISTORY:  ?Past Medical History:  ?Diagnosis Date  ? Allergy   ? Anemia   ? Anxiety   ?  Arthritis pain 08/20/2014  ? Back pain   ? COVID-19   ? GERD (gastroesophageal reflux disease)   ? History of stress test 02/2011 ([facility])  ? there was no evidence of ischemia, but she only went 3 1/2 minutes on the treadmill making it very difficult to get a good accurate assessment, however  ? Hx of echocardiogram   ? The echocadiogram was essentially normal with the exception of mild mitral calcification and borderline concentric LVH, which in the setting of her hypertension at this early age is something that mean her blood pressure is well controlled.   ? Hypertension   ? Iron deficiency anemia, unspecified 07/09/2014  ? Migraines   ? Palpitations   ? Swelling   ? Vitamin B12 deficiency 03/17/2016  ? ? ?SURGICAL HISTORY: ?Past Surgical History:  ?Procedure Laterality Date  ? BREAST SURGERY    ? CARPAL TUNNEL RELEASE Right 09/24/2021  ? Procedure: RIGHT CARPAL TUNNEL RELEASE;  Surgeon: 09/26/2021, MD;  Location: [facility];  Service: Orthopedics;  Laterality: Right;  ? CHOLECYSTECTOMY    ? GASTRIC BYPASS  2008  ? OOPHORECTOMY Right 2010  ? REDUCTION MAMMAPLASTY Bilateral   ? ? ?SOCIAL HISTORY: ?Social History  ? ?Socioeconomic History  ? Marital status: Married  ?  Spouse name: Damon  ? Number of children: 4  ? Years of education: Associates  ? Highest education level: Not on file  ?Occupational History  ? Occupation: 2011  ?Tobacco Use  ?  Smoking status: Never  ? Smokeless tobacco: Never  ?Vaping Use  ? Vaping Use: Never used  ?Substance and Sexual Activity  ? Alcohol use: No  ? Drug use: No  ? Sexual activity: Yes  ?Other Topics Concern  ? Not on file  ?Social History Narrative  ? Right handed.  ? Lives at home with husband and four children.  ? No caffeine use.  ? ?Social Determinants of Health  ? ?Financial Resource Strain: Not on file  ?Food Insecurity: Not on file  ?Transportation Needs: Not on file  ?Physical Activity: Not on file  ?Stress: Not on file  ?Social Connections: Not  on file  ?Intimate Partner Violence: Not on file  ? ? ?FAMILY HISTORY: ?Family History  ?Problem Relation Age of Onset  ? Asthma Mother   ? Heart disease Mother   ? Diabetes Mother   ? Early death Mother   ? Hypertension Mother   ? Thyroid disease Mother   ? Anxiety disorder Mother   ? Hypertension Father   ? Heart disease Father   ? Breast cancer Sister 54  ? Breast cancer Maternal Aunt   ?     under 65  ? Breast cancer Paternal Aunt   ? Stroke Maternal Grandmother   ? Heart disease Maternal Grandmother   ? Hypertension Maternal Grandmother   ? Hyperlipidemia Maternal Grandmother   ? Breast cancer Maternal Grandmother   ? ? ?ALLERGIES:  is allergic to doxycycline, fioricet [butalbital-apap-caffeine], inderal [propranolol], and nsaids. ? ?MEDICATIONS:  ?Current Outpatient Medications  ?Medication Sig Dispense Refill  ? acetaminophen (TYLENOL) 500 MG tablet Take 2 tablets (1,000 mg total) by mouth every 6 (six) hours as needed. 30 tablet 0  ? acetaminophen-codeine (TYLENOL #3) 300-30 MG tablet Take 1 tablet by mouth 2 (two) times daily as needed for moderate pain. 30 tablet 0  ? albuterol (VENTOLIN HFA) 108 (90 Base) MCG/ACT inhaler Inhale 1-2 puffs into the lungs every 6 (six) hours as needed for wheezing or shortness of breath. 18 g 0  ? celecoxib (CELEBREX) 200 MG capsule Take 1 capsule (200 mg total) by mouth 2 (two) times daily. 30 capsule 3  ? cholecalciferol (VITAMIN D3) 25 MCG (1000 UNIT) tablet Take 1 tablet (1,000 Units total) by mouth daily. 30 tablet 3  ? cloNIDine (CATAPRES) 0.2 MG tablet Take 0.4 mg by mouth at bedtime.    ? Fremanezumab-vfrm (AJOVY) 225 MG/1.5ML SOAJ Inject 225 mg into the skin every 30 (thirty) days. 1.68 mL 5  ? HYDROcodone-acetaminophen (NORCO) 5-325 MG tablet Take 1 tablet by mouth 3 (three) times daily as needed. 15 tablet 0  ? levocetirizine (XYZAL) 5 MG tablet Take 5 mg by mouth every evening.    ? medroxyPROGESTERone (DEPO-PROVERA) 150 MG/ML injection     ? montelukast  (SINGULAIR) 10 MG tablet Take 10 mg by mouth at bedtime.    ? mupirocin ointment (BACTROBAN) 2 % Apply 1 application topically daily. 22 g 2  ? traMADol (ULTRAM) 50 MG tablet Take 1-2 tablets (50-100 mg total) by mouth daily as needed. 20 tablet 0  ? Ubrogepant (UBRELVY) 100 MG TABS Take 1 tablet at the onset of migraine. Can repeat in 2 hours if needed. Only 2 tabs/24 hours 15 tablet 11  ? UNABLE TO FIND Med Name: allergy injections  3 times a week    ? ?Current Facility-Administered Medications  ?Medication Dose Route Frequency Provider Last Rate Last Admin  ? diclofenac Sodium (VOLTAREN) 1 % topical gel 4 g  4 g Topical QID Eldred MangesYates, Mark C, MD      ? ? ?REVIEW OF SYSTEMS:   ?Constitutional: ( - ) fevers, ( - )  chills , ( - ) night sweats ?Eyes: ( - ) blurriness of vision, ( - ) double vision, ( - ) watery eyes ?Ears, nose, mouth, throat, and face: ( - ) mucositis, ( - ) sore throat ?Respiratory: ( - ) cough, ( - ) dyspnea, ( - ) wheezes ?Cardiovascular: ( - ) palpitation, ( - ) chest discomfort, ( - ) lower extremity swelling ?Gastrointestinal:  ( - ) nausea, ( - ) heartburn, ( - ) change in bowel habits ?Skin: ( - ) abnormal skin rashes ?Lymphatics: ( - ) new lymphadenopathy, ( - ) easy bruising ?Neurological: ( - ) numbness, ( - ) tingling, ( - ) new weaknesses ?Behavioral/Psych: ( - ) mood change, ( - ) new changes  ?All other systems were reviewed with the patient and are negative. ? ?PHYSICAL EXAMINATION: ? ?Vitals:  ? 01/09/22 1346  ?BP: 108/72  ?Pulse: 71  ?Temp: 98.8 ?F (37.1 ?C)  ?SpO2: 100%  ? ? ? ?Filed Weights  ? 01/09/22 1346  ?Weight: 179 lb 14.4 oz (81.6 kg)  ? ? ?GENERAL: tired appearing middle aged [place] female. alert, no distress and comfortable ?SKIN: skin color, texture, turgor are normal, no rashes or significant lesions ?EYES: conjunctiva are pink and non-injected, sclera clear ?LYMPH: Mildly enlarged right submandibular lymph node, nontender ?LUNGS: clear to auscultation and  percussion with normal breathing effort ?HEART: regular rate & rhythm and no murmurs and no lower extremity edema ?Musculoskeletal: no cyanosis of digits and no clubbing  ?PSYCH: alert & oriented x 3, fluent speech ?

## 2022-01-09 NOTE — Telephone Encounter (Signed)
Patient called advised her Rx Tylenol  with Codeine need prior auth before the Rx can be filled. The number to contact patient is [phone]  ?

## 2022-01-09 NOTE — Progress Notes (Signed)
? ?Office Visit Note ?  ?Patient: Meredith Byrd           ?Date of Birth: Feb 14, 1981           ?MRN: [id number] ?Visit Date: 01/09/2022 ?             ?Requested by: Irena Reichmann, DO ?[address] ?[address] ?[place],  [place] [address] ?PCP: Irena Reichmann, DO ? ? ?Assessment & Plan: ?Visit Diagnoses:  ?1. Neck pain   ?2. Chronic pain of both shoulders   ? ? ?Plan: Impression is chronic bilateral shoulder pain left greater than right.  At this point, it is hard to tell what exactly is causing her pain, but I would like to try diagnostic and hopefully therapeutic subacromial cortisone injection.  We will proceed with the left, more symptomatic shoulder first.  If she does get relief she will follow-up for right shoulder injection.  If she does not get relief she will call us and let us know. ? ?Follow-Up Instructions: Return if symptoms worsen or fail to improve.  ? ?Orders:  ?Orders Placed This Encounter  ?Procedures  ? Large Joint Inj  ? XR Cervical Spine 2 or 3 views  ? XR Shoulder Right  ? XR Shoulder Left  ? ?No orders of the defined types were placed in this encounter. ? ? ? ? Procedures: ?Large Joint Inj: L subacromial bursa on 01/09/2022 8:55 AM ?Indications: pain ?Details: 22 G needle ?Medications: 2 mL lidocaine 2 %; 2 mL bupivacaine 0.25 %; 40 mg methylPREDNISolone acetate 40 MG/ML ?Outcome: tolerated well, no immediate complications ?Patient was prepped and draped in the usual sterile fashion.  ? ? ? ? ?Clinical Data: ?No additional findings. ? ? ?Subjective: ?Chief Complaint  ?Patient presents with  ? Left Shoulder - Pain  ? Right Shoulder - Pain  ? Neck - Pain  ? ? ?HPI patient is a pleasant 41 year old female who comes in today with bilateral shoulder pain left greater than right for the past several years.  She denies any injury or change in activity.  The pain appears to have worsened after she has been in physical therapy for right carpal tunnel release working on exercises.  The pain is  primarily to the proximal deltoid and radiates to the back of the neck.  Pain is worse with forward flexion and abduction.  She denies any weakness to either upper extremity.  She has been taking Tylenol without significant relief.  She denies any paresthesias. ? ?Review of Systems as detailed in HPI.  All others reviewed and are negative. ? ? ?Objective: ?Vital Signs: There were no vitals taken for this visit. ? ?Physical Exam well-developed well-nourished female no acute distress.  Alert and oriented x3. ? ?Ortho Exam bilateral shoulder exam reveals forward flexion and abduction to approximately 120 degrees.  Internal rotation to L5.  Minimally positive empty can both sides.  No focal weakness.  She does have slight pain with cervical spine extension.  She is is neurovascular intact distally. ? ?Specialty Comments:  ?No specialty comments available. ? ?Imaging: ?XR Cervical Spine 2 or 3 views ? ?Result Date: 01/09/2022 ?X-rays demonstrate straightening of the cervical spine.  No other acute or structural abnormalities ? ?XR Shoulder Left ? ?Result Date: 01/09/2022 ?No acute or structural abnormalities ? ?XR Shoulder Right ? ?Result Date: 01/09/2022 ?No acute or structural abnormalities  ? ? ?PMFS History: ?Patient Active Problem List  ? Diagnosis Date Noted  ? Chronic pain of right knee 06/06/2021  ?  Right carpal tunnel syndrome 01/23/2021  ? No-show for appointment 11/25/2020  ? 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 On Depo-Provera for contraception 05/31/2019  ? Chronic insomnia 05/17/2019  ? Lumbar facet arthropathy 12/14/2018  ? Dysmenorrhea, unspecified 05/16/2018  ? Knee pain, left 11/17/2017  ? Generalized anxiety disorder 10/04/2017  ? Morning headache 05/04/2017  ? Daytime somnolence 05/04/2017  ? Class 3 obesity with body mass index (BMI) of 45.0 to 49.9 in adult 03/28/2017  ? Chronic low back pain 01/19/2017  ? Vitamin D deficiency 01/18/2017  ? Vitamin B12 deficiency 03/17/2016  ? Beta  thalassemia trait 03/25/2015  ? Intractable migraine without aura and without status migrainosus 11/23/2014  ? Elevated sed rate 08/20/2014  ? Arthritis pain 08/20/2014  ? Iron deficiency anemia 07/09/2014  ? Symptomatic anemia 06/05/2014  ? Chest pain 05/01/2014  ? GERD (gastroesophageal reflux disease) 03/22/2014  ? S/P gastric bypass 03/22/2014  ? Insomnia 03/22/2014  ? Migraine 03/22/2014  ? Palpitations 03/22/2014  ? ?Past Medical History:  ?Diagnosis Date  ? Allergy   ? Anemia   ? Anxiety   ? Arthritis pain 08/20/2014  ? Back pain   ? COVID-19   ? GERD (gastroesophageal reflux disease)   ? History of stress test 02/2011 ([facility])  ? there was no evidence of ischemia, but she only went 3 1/2 minutes on the treadmill making it very difficult to get a good accurate assessment, however  ? Hx of echocardiogram   ? The echocadiogram was essentially normal with the exception of mild mitral calcification and borderline concentric LVH, which in the setting of her hypertension at this early age is something that mean her blood pressure is well controlled.   ? Hypertension   ? Iron deficiency anemia, unspecified 07/09/2014  ? Migraines   ? Palpitations   ? Swelling   ? Vitamin B12 deficiency 03/17/2016  ?  ?Family History  ?Problem Relation Age of Onset  ? Asthma Mother   ? Heart disease Mother   ? Diabetes Mother   ? Early death Mother   ? Hypertension Mother   ? Thyroid disease Mother   ? Anxiety disorder Mother   ? Hypertension Father   ? Heart disease Father   ? Breast cancer Sister 62  ? Breast cancer Maternal Aunt   ?     under 65  ? Breast cancer Paternal Aunt   ? Stroke Maternal Grandmother   ? Heart disease Maternal Grandmother   ? Hypertension Maternal Grandmother   ? Hyperlipidemia Maternal Grandmother   ? Breast cancer Maternal Grandmother   ?  ?Past Surgical History:  ?Procedure Laterality Date  ? BREAST SURGERY    ? CARPAL TUNNEL RELEASE Right 09/24/2021  ? Procedure: RIGHT CARPAL TUNNEL RELEASE;  Surgeon: Tarry Kos, MD;  Location: [facility];  Service: Orthopedics;  Laterality: Right;  ? CHOLECYSTECTOMY    ? GASTRIC BYPASS  2008  ? OOPHORECTOMY Right 2010  ? REDUCTION MAMMAPLASTY Bilateral   ? ?Social History  ? ?Occupational History  ? Occupation: Human resources officer  ?Tobacco Use  ? Smoking status: Never  ? Smokeless tobacco: Never  ?Vaping Use  ? Vaping Use: Never used  ?Substance and Sexual Activity  ? Alcohol use: No  ? Drug use: No  ? Sexual activity: Yes  ? ? ? ? ? ? ?

## 2022-01-12 ENCOUNTER — Encounter: Payer: Self-pay | Admitting: Hematology and Oncology

## 2022-01-12 MED ORDER — VITAMIN D 25 MCG (1000 UNIT) PO TABS: 1000.0000 [IU] | ORAL_TABLET | Freq: Every day | ORAL | 3 refills | Status: DC

## 2022-01-13 ENCOUNTER — Other Ambulatory Visit: Payer: Self-pay

## 2022-01-13 ENCOUNTER — Telehealth: Payer: Self-pay

## 2022-01-13 DIAGNOSIS — E559 Vitamin D deficiency, unspecified: Secondary | ICD-10-CM

## 2022-01-13 LAB — METHYLMALONIC ACID, SERUM: Methylmalonic Acid, Quantitative: 78 nmol/L (ref 0–378)

## 2022-01-13 MED ORDER — VITAMIN D 25 MCG (1000 UNIT) PO TABS: 1000.0000 [IU] | ORAL_TABLET | Freq: Every day | ORAL | 3 refills | Status: DC

## 2022-01-13 NOTE — Telephone Encounter (Signed)
Pt was advised of [place] of head and neck for 01/19/22 but date was not convenient for pt so she rescheduled it to 01/23/22. ? ?Pt also requested her new prescription for Vitamin D3 be changed from [facility] to [facility]  Rx re sent to [facility] ?

## 2022-01-14 NOTE — Telephone Encounter (Signed)
Kiasha Haile (Key: [id number]) ? ?[facility] is reviewing your PA request and will respond within 24 hours for Medicaid or up to 72 hours for non-Medicaid plans, based on the required timeframe determined by state or federal regulations. To check for an update later, open this request from your dashboard. ?

## 2022-01-19 ENCOUNTER — Other Ambulatory Visit: Payer: Self-pay | Admitting: Physician Assistant

## 2022-01-19 ENCOUNTER — Other Ambulatory Visit (HOSPITAL_COMMUNITY): Payer: Managed Care, Other (non HMO)

## 2022-01-20 ENCOUNTER — Other Ambulatory Visit: Payer: Self-pay | Admitting: Physician Assistant

## 2022-01-20 MED ORDER — VITAMIN D3 250 MCG (10000 UT) PO TABS: 250.0000 ug | ORAL_TABLET | Freq: Every day | ORAL | 3 refills | Status: DC

## 2022-01-23 ENCOUNTER — Ambulatory Visit (HOSPITAL_COMMUNITY)
Admission: RE | Admit: 2022-01-23 | Discharge: 2022-01-23 | Disposition: A | Discharge status: Home / Self Care | Payer: Commercial Managed Care - HMO | Source: Ambulatory Visit | Attending: Physician Assistant | Admitting: Physician Assistant

## 2022-01-23 DIAGNOSIS — R599 Enlarged lymph nodes, unspecified: Secondary | ICD-10-CM | POA: Insufficient documentation

## 2022-01-24 ENCOUNTER — Inpatient Hospital Stay: Payer: Commercial Managed Care - HMO | Attending: Hematology and Oncology

## 2022-01-24 DIAGNOSIS — E538 Deficiency of other specified B group vitamins: Secondary | ICD-10-CM | POA: Insufficient documentation

## 2022-01-24 DIAGNOSIS — D509 Iron deficiency anemia, unspecified: Secondary | ICD-10-CM | POA: Insufficient documentation

## 2022-01-26 ENCOUNTER — Encounter: Payer: Self-pay | Admitting: Hematology and Oncology

## 2022-01-26 ENCOUNTER — Institutional Professional Consult (permissible substitution): Payer: Managed Care, Other (non HMO) | Admitting: Pulmonary Disease

## 2022-01-27 ENCOUNTER — Telehealth: Payer: Self-pay

## 2022-01-27 ENCOUNTER — Other Ambulatory Visit: Payer: Self-pay | Admitting: Physician Assistant

## 2022-01-27 MED ORDER — VITAMIN D (ERGOCALCIFEROL) 1.25 MG (50000 UNIT) PO CAPS: 50000.0000 [IU] | ORAL_CAPSULE | ORAL | 3 refills | Status: DC

## 2022-01-27 NOTE — Telephone Encounter (Signed)
-----   Message from Briant Cedar, PA-C sent at 01/26/2022  4:26 PM EDT ----- ?Please notify patient that there was no abnormality detected. The right-sided thyroid nodule is unchanged ?compared to the 01/2017 examination. ?

## 2022-01-27 NOTE — Telephone Encounter (Signed)
Pt advised and verbalized understanding.

## 2022-02-02 ENCOUNTER — Encounter: Payer: Self-pay | Admitting: Hematology and Oncology

## 2022-02-07 ENCOUNTER — Encounter: Payer: Self-pay | Admitting: Hematology and Oncology

## 2022-02-07 ENCOUNTER — Inpatient Hospital Stay: Payer: Commercial Managed Care - HMO

## 2022-02-07 VITALS — BP 111/71 | HR 92 | Temp 97.7°F | Resp 17 | Ht 60.0 in

## 2022-02-07 DIAGNOSIS — E538 Deficiency of other specified B group vitamins: Secondary | ICD-10-CM | POA: Diagnosis present

## 2022-02-07 DIAGNOSIS — D508 Other iron deficiency anemias: Secondary | ICD-10-CM

## 2022-02-07 DIAGNOSIS — D509 Iron deficiency anemia, unspecified: Secondary | ICD-10-CM | POA: Diagnosis present

## 2022-02-07 MED ORDER — CYANOCOBALAMIN 1000 MCG/ML IJ SOLN
1000.0000 ug | Freq: Once | INTRAMUSCULAR | Status: AC
  Administered 2022-02-07: 1000 ug via INTRAMUSCULAR
  Filled 2022-02-07: qty 1

## 2022-02-07 NOTE — Patient Instructions (Signed)
Vitamin B12 and Folate Test Why am I having this test? Vitamin B12 and folate (folic acid) are B vitamins needed to make red blood cells and keep your nervous system healthy. Vitamin B12 is in foods such as meats, eggs, dairy products, and fish. Folate is in fruits, beans, and leafy green vegetables. Some foods, such as whole grains, bread, and cereals have vitamin B12 added to them (are fortified). You may not have enough of these B vitamins (have a deficiency) if your diet lacks these vitamins. Low levels can also be caused by diseases or having had surgeries on your stomach or small intestine that interfere with your ability to absorb the vitamins from your food. You may have a vitamin B12 and folate test if: You have symptoms of vitamin B12 or folate deficiency, such as tiredness (fatigue), headache, confusion, poor balance, or tingling and numbness in your hands and feet. You are pregnant or breastfeeding. Women who are pregnant or breastfeeding need more folate and may need to take dietary supplements. Your red blood cell count is low (anemia). You are an older person and have mental confusion. You have a disease or condition that may lead to a deficiency of these B vitamins. What is being tested? This test measures the amount of vitamin B12 and folate in your blood. The tests for vitamin B12 and folate may be done together or separately. What kind of sample is taken?  A blood sample is required for this test. It is usually collected by inserting a needle into a blood vessel. How do I prepare for this test? Follow instructions from your health care provider about eating and drinking before the test. Tell a health care provider about: All medicines you are taking, including vitamins, herbs, eye drops, creams, and over-the-counter medicines. Any medical conditions you have. Whether you are pregnant or may be pregnant. How often you drink alcohol. How are the results reported? Your test  results will be reported as values that identify the amount of vitamin B12 and folate in your blood. Your health care provider will compare your results to normal ranges that were established after testing a large group of people (reference ranges). Reference ranges may vary among labs and hospitals. For this test, common reference ranges are: Vitamin B12: 160-950 pg/mL or 118-701 pmol/L (SI units). Folate: 5-25 ng/mL or 11-57 nmol/L (SI units). What do the results mean? Results within the reference range are considered normal. Vitamin B12 or folate levels that are lower than the reference range may be caused by: Poor nutrition or eating a vegetarian or vegan diet that does not include any foods that come from animals. Having alcoholism. Having certain diseases that make it hard to absorb vitamin B12. These diseases include Crohn's disease, chronic pancreatitis, and cystic fibrosis. Taking certain medicines. Having had surgeries on your stomach or small intestine. High levels of vitamin B12 are rare, but they may happen if you have: Cancer. Liver disease. High levels of folate may happen if: You have anemia. You are vegetarian. You have had a recent blood transfusion. Talk with your health care provider about what your results mean. Questions to ask your health care provider Ask your health care provider, or the department that is doing the test: When will my results be ready? How will I get my results? What are my treatment options? What other tests do I need? What are my next steps? Summary Vitamin B12 and folate (folic acid) are both B vitamins that are needed to   make red blood cells and to keep your nervous system healthy. You may not have enough B vitamins in your body if you do not get enough in your diet or if you have a disease that makes it hard to absorb vitamin B12. This test measures the amount of vitamin B12 and folate in your blood. A blood sample is required for the  test. Talk with your health care provider about what your results mean. This information is not intended to replace advice given to you by your health care provider. Make sure you discuss any questions you have with your health care provider. Document Revised: 05/23/2021 Document Reviewed: 05/23/2021 Elsevier Patient Education  2023 Elsevier Inc. 

## 2022-02-13 ENCOUNTER — Institutional Professional Consult (permissible substitution): Payer: Managed Care, Other (non HMO) | Admitting: Pulmonary Disease

## 2022-02-19 IMAGING — MG MM DIGITAL SCREENING BILAT W/ TOMO AND CAD
8 series · 8 of 24 positions shown · non-contrast
Comparison: Previous exam(s).

CLINICAL DATA: Screening.

EXAM:
DIGITAL SCREENING BILATERAL MAMMOGRAM WITH TOMOSYNTHESIS AND CAD
TECHNIQUE: Bilateral screening digital craniocaudal and mediolateral oblique
mammograms were obtained. Bilateral screening digital breast
tomosynthesis was performed. The images were evaluated with
computer-aided detection.

[R CC synth-2D]
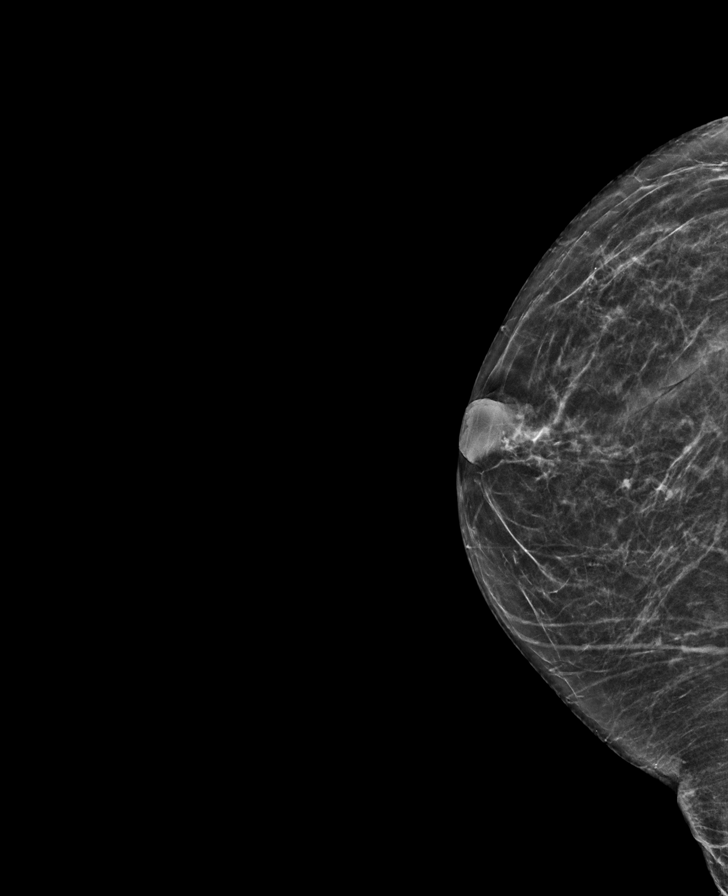

[L CC synth-2D]
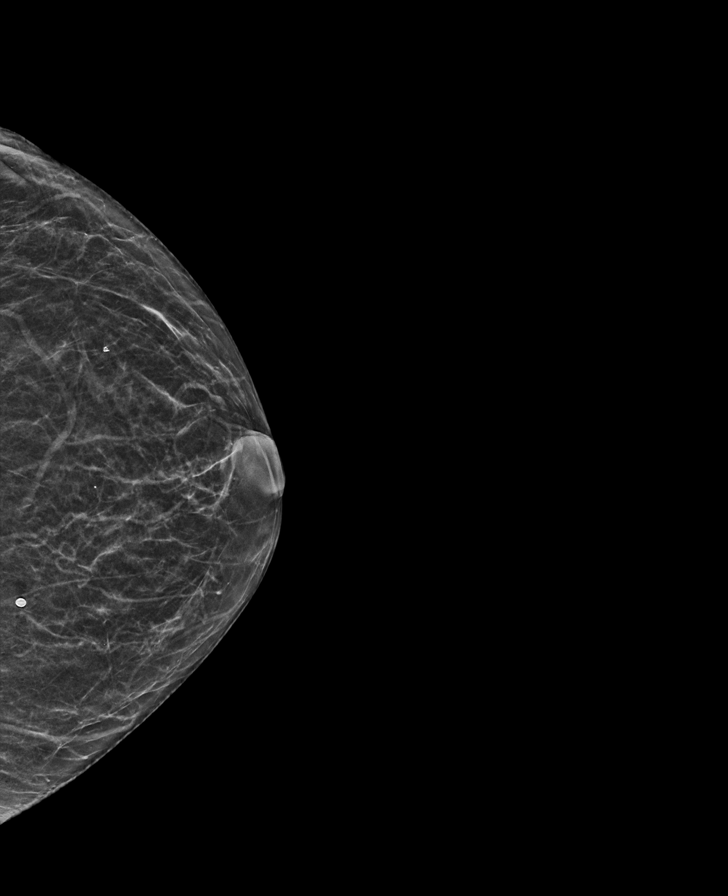

[L MLO synth-2D]
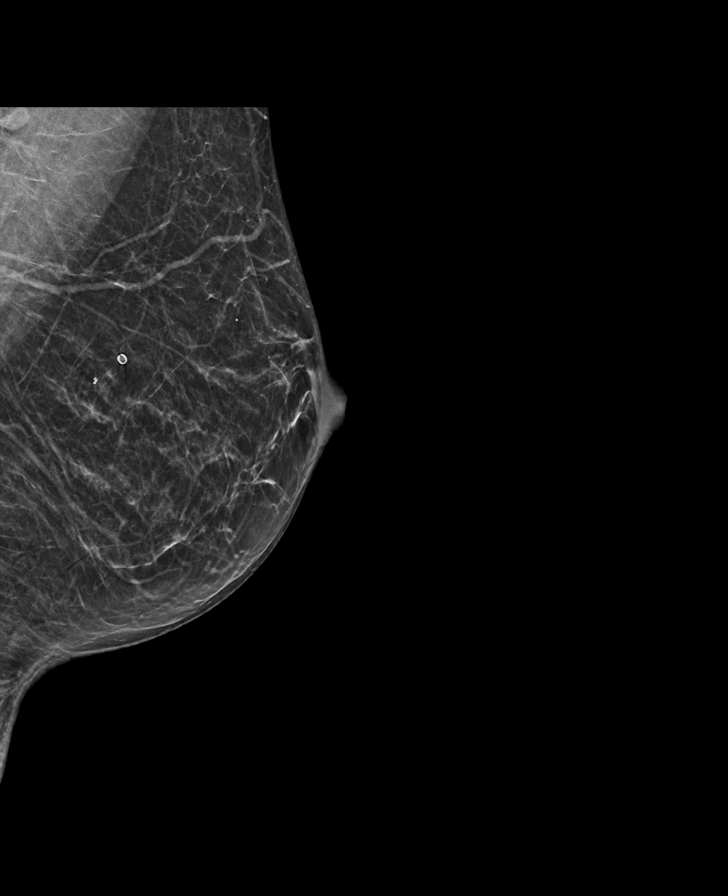

[R MLO synth-2D]
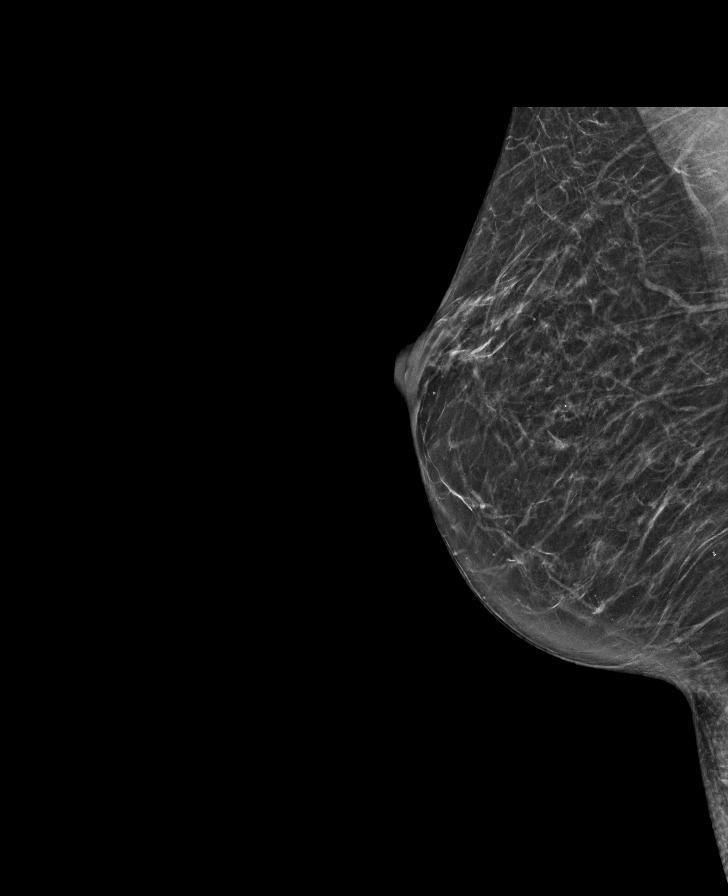

[R CC tomo · tomo slice 27/54.0]
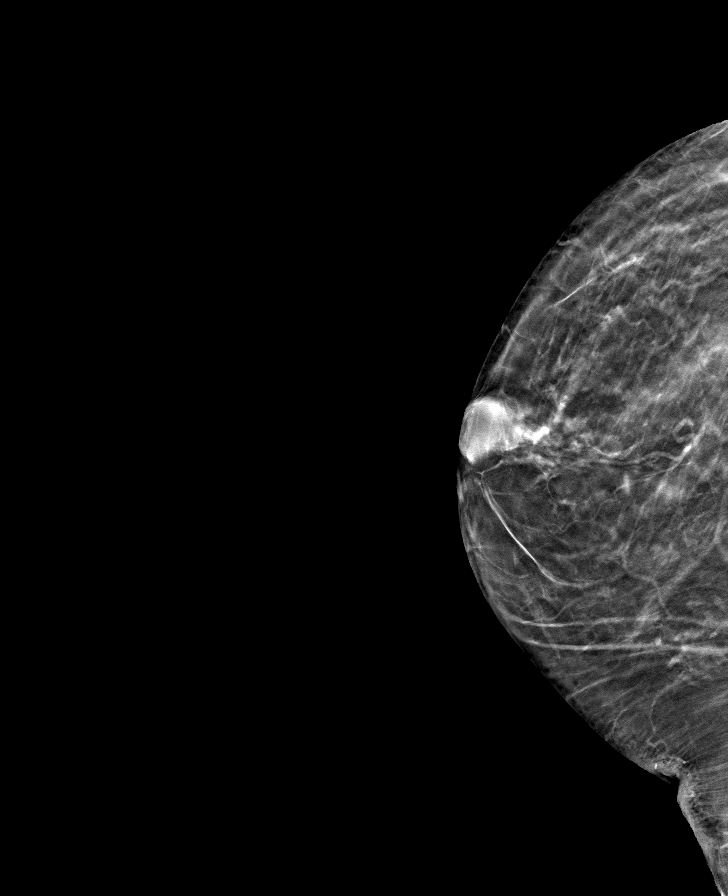

[L MLO tomo · tomo slice 30/59.0]
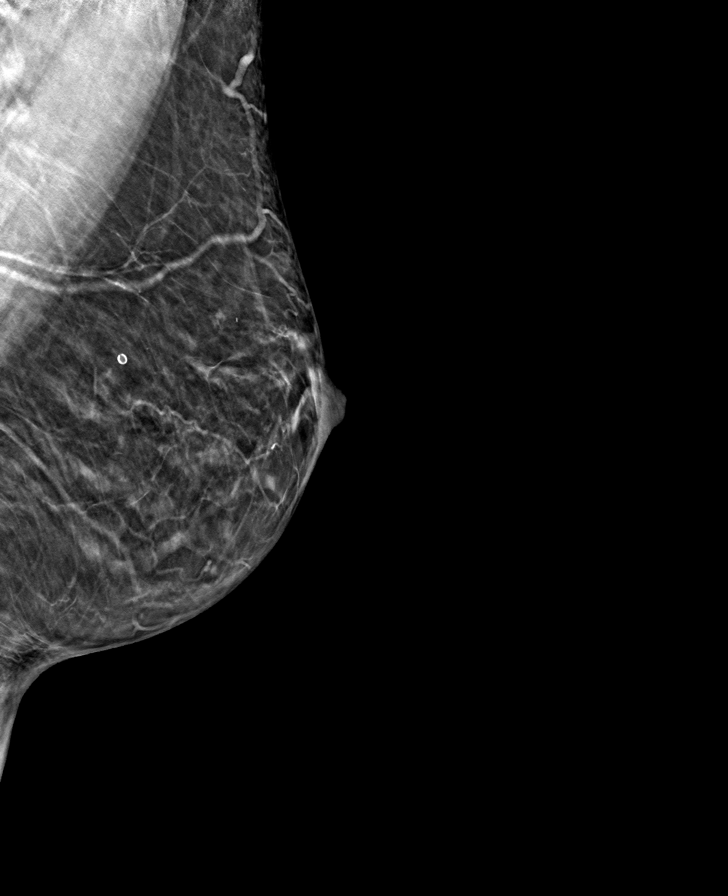

[R MLO tomo · tomo slice 31/61.0]
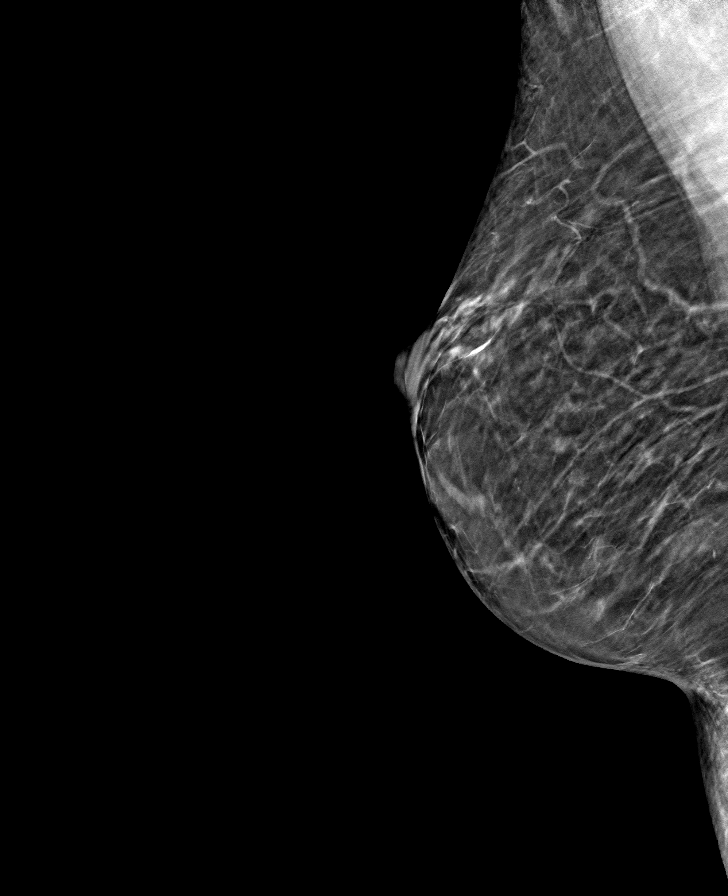

[L CC tomo · tomo slice 25/48.0]
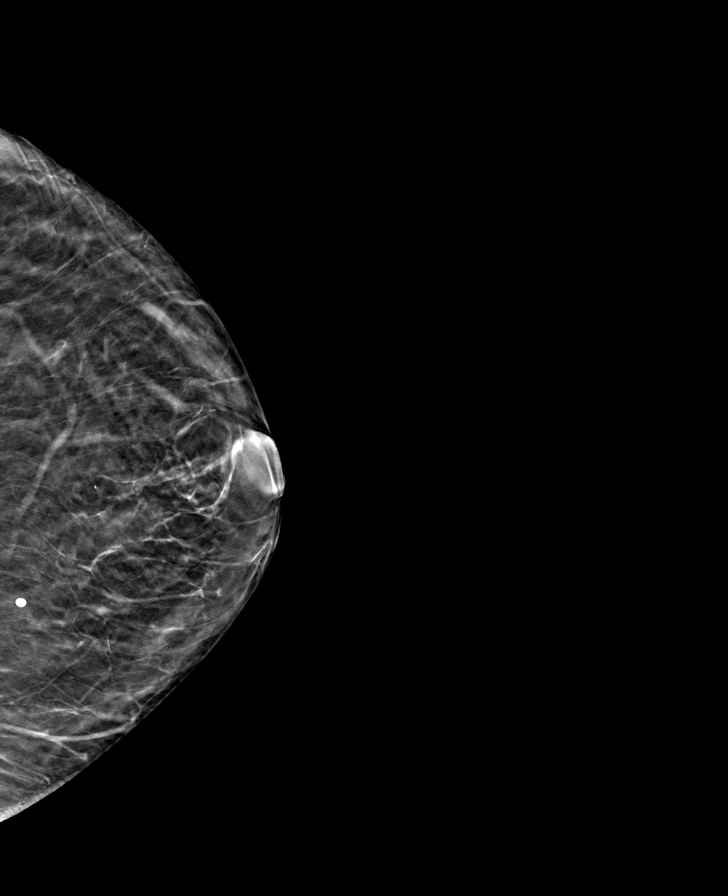

[8 of 24 positions shown; findings below may reference images not displayed]

ACR Breast Density Category b: There are scattered areas of
fibroglandular density.
FINDINGS: There are no findings suspicious for malignancy.
IMPRESSION: No mammographic evidence of malignancy. A result letter of this
screening mammogram will be mailed directly to the patient.

RECOMMENDATION:
Screening mammogram in one year. (Code:51-O-LD2)

BI-RADS CATEGORY  1: Negative.

## 2022-02-20 ENCOUNTER — Ambulatory Visit: Payer: Commercial Managed Care - HMO | Admitting: Orthopaedic Surgery

## 2022-02-20 DIAGNOSIS — G8929 Other chronic pain: Secondary | ICD-10-CM | POA: Diagnosis not present

## 2022-02-20 DIAGNOSIS — M25511 Pain in right shoulder: Secondary | ICD-10-CM | POA: Diagnosis not present

## 2022-02-20 MED ORDER — LIDOCAINE HCL 2 % IJ SOLN
2.0000 mL | INTRAMUSCULAR | Status: AC | PRN
  Administered 2022-02-20: 2 mL

## 2022-02-20 MED ORDER — METHYLPREDNISOLONE ACETATE 40 MG/ML IJ SUSP
40.0000 mg | INTRAMUSCULAR | Status: AC | PRN
  Administered 2022-02-20: 40 mg via INTRA_ARTICULAR

## 2022-02-20 MED ORDER — BUPIVACAINE HCL 0.25 % IJ SOLN
2.0000 mL | INTRAMUSCULAR | Status: AC | PRN
  Administered 2022-02-20: 2 mL via INTRA_ARTICULAR

## 2022-02-20 MED ORDER — ACETAMINOPHEN-CODEINE #3 300-30 MG PO TABS: 1.0000 | ORAL_TABLET | Freq: Two times a day (BID) | ORAL | 0 refills | Status: DC | PRN

## 2022-02-20 NOTE — Progress Notes (Signed)
? ?Office Visit Note ?  ?Patient: Meredith Byrd           ?Date of Birth: [birth year]           ?MRN: [id number] ?Visit Date: 02/20/2022 ?             ?Requested by: Irena Reichmann, DO ?[address] ?[address] ?[place],  [place] [address] ?PCP: Irena Reichmann, DO ? ? ?Assessment & Plan: ?Visit Diagnoses:  ?1. Chronic right shoulder pain   ? ? ?Plan: Impression is chronic right shoulder pain likely from overuse.  The patient did get great relief following the left shoulder subacromial injection a few months ago would like to proceed with right shoulder injection today.  She tolerated this well.  She will follow-up with [place] as needed. ? ?Follow-Up Instructions: Return if symptoms worsen or fail to improve.  ? ?Orders:  ?Orders Placed This Encounter  ?Procedures  ? Large Joint Inj: R subacromial bursa  ? ?Meds ordered this encounter  ?Medications  ? acetaminophen-codeine (TYLENOL #3) 300-30 MG tablet  ?  Sig: Take 1 tablet by mouth 2 (two) times daily as needed for moderate pain.  ?  Dispense:  30 tablet  ?  Refill:  0  ? ? ? ? Procedures: ?Large Joint Inj: R subacromial bursa on 02/20/2022 8:30 AM ?Indications: pain ?Details: 22 G needle ?Medications: 2 mL lidocaine 2 %; 2 mL bupivacaine 0.25 %; 40 mg methylPREDNISolone acetate 40 MG/ML ?Outcome: tolerated well, no immediate complications ?Patient was prepped and draped in the usual sterile fashion.  ? ? ? ? ?Clinical Data: ?No additional findings. ? ? ?Subjective: ?Chief Complaint  ?Patient presents with  ? Left Shoulder - Pain  ? ? ?HPI patient is a pleasant 41 year old female who comes in today for right shoulder injection.  I saw her a few months ago with bilateral shoulder pain which has been aggravated with doing physical therapy following her right carpal tunnel release.  We proceeded with a left shoulder subacromial injection which has significantly helped.  She is here today for right shoulder injection. ? ? ? ? ?Objective: ?Vital Signs: There were no  vitals taken for this visit. ? ? ? ?Ortho Exam unchanged right shoulder exam ? ?Specialty Comments:  ?No specialty comments available. ? ?Imaging: ?No new imaging ? ? ?PMFS History: ?Patient Active Problem List  ? Diagnosis Date Noted  ? Chronic pain of right knee 06/06/2021  ? Right carpal tunnel syndrome 01/23/2021  ? No-show for appointment 11/25/2020  ? 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 On Depo-Provera for contraception 05/31/2019  ? Chronic insomnia 05/17/2019  ? Lumbar facet arthropathy 12/14/2018  ? Dysmenorrhea, unspecified 05/16/2018  ? Knee pain, left 11/17/2017  ? Generalized anxiety disorder 10/04/2017  ? Morning headache 05/04/2017  ? Daytime somnolence 05/04/2017  ? Class 3 obesity with body mass index (BMI) of 45.0 to 49.9 in adult 03/28/2017  ? Chronic low back pain 01/19/2017  ? Vitamin D deficiency 01/18/2017  ? Vitamin B12 deficiency 03/17/2016  ? Beta thalassemia trait 03/25/2015  ? Intractable migraine without aura and without status migrainosus 11/23/2014  ? Elevated sed rate 08/20/2014  ? Arthritis pain 08/20/2014  ? Iron deficiency anemia 07/09/2014  ? Symptomatic anemia 06/05/2014  ? Chest pain 05/01/2014  ? GERD (gastroesophageal reflux disease) 03/22/2014  ? S/P gastric bypass 03/22/2014  ? Insomnia 03/22/2014  ? Migraine 03/22/2014  ? Palpitations 03/22/2014  ? ?Past Medical History:  ?Diagnosis Date  ? Allergy   ?  Anemia   ? Anxiety   ? Arthritis pain 08/20/2014  ? Back pain   ? COVID-19   ? GERD (gastroesophageal reflux disease)   ? History of stress test 02/2011 ([facility])  ? there was no evidence of ischemia, but she only went 3 1/2 minutes on the treadmill making it very difficult to get a good accurate assessment, however  ? Hx of echocardiogram   ? The echocadiogram was essentially normal with the exception of mild mitral calcification and borderline concentric LVH, which in the setting of her hypertension at this early age is something that mean her blood pressure is  well controlled.   ? Hypertension   ? Iron deficiency anemia, unspecified 07/09/2014  ? Migraines   ? Palpitations   ? Swelling   ? Vitamin B12 deficiency 03/17/2016  ?  ?Family History  ?Problem Relation Age of Onset  ? Asthma Mother   ? Heart disease Mother   ? Diabetes Mother   ? Early death Mother   ? Hypertension Mother   ? Thyroid disease Mother   ? Anxiety disorder Mother   ? Hypertension Father   ? Heart disease Father   ? Breast cancer Sister 41  ? Breast cancer Maternal Aunt   ?     under 65  ? Breast cancer Paternal Aunt   ? Stroke Maternal Grandmother   ? Heart disease Maternal Grandmother   ? Hypertension Maternal Grandmother   ? Hyperlipidemia Maternal Grandmother   ? Breast cancer Maternal Grandmother   ?  ?Past Surgical History:  ?Procedure Laterality Date  ? BREAST SURGERY    ? CARPAL TUNNEL RELEASE Right 09/24/2021  ? Procedure: RIGHT CARPAL TUNNEL RELEASE;  Surgeon: Tarry Kos, MD;  Location: [facility];  Service: Orthopedics;  Laterality: Right;  ? CHOLECYSTECTOMY    ? GASTRIC BYPASS  2008  ? OOPHORECTOMY Right 2010  ? REDUCTION MAMMAPLASTY Bilateral   ? ?Social History  ? ?Occupational History  ? Occupation: Human resources officer  ?Tobacco Use  ? Smoking status: Never  ? Smokeless tobacco: Never  ?Vaping Use  ? Vaping Use: Never used  ?Substance and Sexual Activity  ? Alcohol use: No  ? Drug use: No  ? Sexual activity: Yes  ? ? ? ? ? ? ?

## 2022-02-21 ENCOUNTER — Inpatient Hospital Stay: Payer: Commercial Managed Care - HMO | Attending: Hematology and Oncology

## 2022-02-21 VITALS — BP 119/73 | HR 91 | Temp 99.0°F | Resp 18

## 2022-02-21 DIAGNOSIS — E538 Deficiency of other specified B group vitamins: Secondary | ICD-10-CM | POA: Diagnosis not present

## 2022-02-21 DIAGNOSIS — Z79899 Other long term (current) drug therapy: Secondary | ICD-10-CM | POA: Insufficient documentation

## 2022-02-21 DIAGNOSIS — D509 Iron deficiency anemia, unspecified: Secondary | ICD-10-CM | POA: Insufficient documentation

## 2022-02-21 DIAGNOSIS — D508 Other iron deficiency anemias: Secondary | ICD-10-CM

## 2022-02-21 MED ORDER — CYANOCOBALAMIN 1000 MCG/ML IJ SOLN
1000.0000 ug | Freq: Once | INTRAMUSCULAR | Status: AC
  Administered 2022-02-21: 1000 ug via INTRAMUSCULAR
  Filled 2022-02-21: qty 1

## 2022-02-21 NOTE — Patient Instructions (Signed)
Vitamin B12 and Folate Test Why am I having this test? Vitamin B12 and folate (folic acid) are B vitamins needed to make red blood cells and keep your nervous system healthy. Vitamin B12 is in foods such as meats, eggs, dairy products, and fish. Folate is in fruits, beans, and leafy green vegetables. Some foods, such as whole grains, bread, and cereals have vitamin B12 added to them (are fortified). You may not have enough of these B vitamins (have a deficiency) if your diet lacks these vitamins. Low levels can also be caused by diseases or having had surgeries on your stomach or small intestine that interfere with your ability to absorb the vitamins from your food. You may have a vitamin B12 and folate test if: You have symptoms of vitamin B12 or folate deficiency, such as tiredness (fatigue), headache, confusion, poor balance, or tingling and numbness in your hands and feet. You are pregnant or breastfeeding. Women who are pregnant or breastfeeding need more folate and may need to take dietary supplements. Your red blood cell count is low (anemia). You are an older person and have mental confusion. You have a disease or condition that may lead to a deficiency of these B vitamins. What is being tested? This test measures the amount of vitamin B12 and folate in your blood. The tests for vitamin B12 and folate may be done together or separately. What kind of sample is taken?  A blood sample is required for this test. It is usually collected by inserting a needle into a blood vessel. How do I prepare for this test? Follow instructions from your health care provider about eating and drinking before the test. Tell a health care provider about: All medicines you are taking, including vitamins, herbs, eye drops, creams, and over-the-counter medicines. Any medical conditions you have. Whether you are pregnant or may be pregnant. How often you drink alcohol. How are the results reported? Your test  results will be reported as values that identify the amount of vitamin B12 and folate in your blood. Your health care provider will compare your results to normal ranges that were established after testing a large group of people (reference ranges). Reference ranges may vary among labs and hospitals. For this test, common reference ranges are: Vitamin B12: 160-950 pg/mL or 118-701 pmol/L (SI units). Folate: 5-25 ng/mL or 11-57 nmol/L (SI units). What do the results mean? Results within the reference range are considered normal. Vitamin B12 or folate levels that are lower than the reference range may be caused by: Poor nutrition or eating a vegetarian or vegan diet that does not include any foods that come from animals. Having alcoholism. Having certain diseases that make it hard to absorb vitamin B12. These diseases include Crohn's disease, chronic pancreatitis, and cystic fibrosis. Taking certain medicines. Having had surgeries on your stomach or small intestine. High levels of vitamin B12 are rare, but they may happen if you have: Cancer. Liver disease. High levels of folate may happen if: You have anemia. You are vegetarian. You have had a recent blood transfusion. Talk with your health care provider about what your results mean. Questions to ask your health care provider Ask your health care provider, or the department that is doing the test: When will my results be ready? How will I get my results? What are my treatment options? What other tests do I need? What are my next steps? Summary Vitamin B12 and folate (folic acid) are both B vitamins that are needed to   make red blood cells and to keep your nervous system healthy. You may not have enough B vitamins in your body if you do not get enough in your diet or if you have a disease that makes it hard to absorb vitamin B12. This test measures the amount of vitamin B12 and folate in your blood. A blood sample is required for the  test. Talk with your health care provider about what your results mean. This information is not intended to replace advice given to you by your health care provider. Make sure you discuss any questions you have with your health care provider. Document Revised: 05/23/2021 Document Reviewed: 05/23/2021 Elsevier Patient Education  2023 Elsevier Inc. 

## 2022-02-24 ENCOUNTER — Telehealth: Payer: Self-pay | Admitting: Orthopaedic Surgery

## 2022-02-24 NOTE — Telephone Encounter (Signed)
I spoke with patient, she needs paperwork for intermittent leave for her physical therapy appointments. I advised we can do that. She is going to email me the forms and I will complete and get sent back.  ?

## 2022-03-06 ENCOUNTER — Other Ambulatory Visit: Payer: 59

## 2022-03-06 ENCOUNTER — Ambulatory Visit: Payer: 59 | Admitting: Hematology and Oncology

## 2022-03-07 ENCOUNTER — Inpatient Hospital Stay: Payer: Commercial Managed Care - HMO

## 2022-03-07 VITALS — BP 119/64 | HR 66 | Temp 98.1°F

## 2022-03-07 DIAGNOSIS — E538 Deficiency of other specified B group vitamins: Secondary | ICD-10-CM

## 2022-03-07 DIAGNOSIS — D508 Other iron deficiency anemias: Secondary | ICD-10-CM

## 2022-03-07 MED ORDER — CYANOCOBALAMIN 1000 MCG/ML IJ SOLN
1000.0000 ug | Freq: Once | INTRAMUSCULAR | Status: AC
  Administered 2022-03-07: 1000 ug via INTRAMUSCULAR
  Filled 2022-03-07: qty 1

## 2022-03-07 NOTE — Patient Instructions (Signed)
Cyclophosphamide Injection Qu es este medicamento? La CICLOFOSFAMIDA es un agente quimioteraputico. Desacelera el crecimiento de clulas cancerosas. Este medicamento se [place] para tratar muchos tipos de cncer tales como linfoma, mieloma, leucemia, cncer de mama y cncer de ovarios, entre otros. Este medicamento puede ser utilizado para otros usos; si tiene alguna pregunta consulte con su proveedor de atencin mdica o con su farmacutico. MARCAS COMUNES: Cyclophosphamide, Cytoxan, Neosar Qu le debo informar a mi profesional de la salud antes de tomar este medicamento? Necesitan saber si usted presenta alguno de los siguientes problemas o situaciones: enfermedad cardiaca antecedentes de frecuencia cardiaca irregular infeccin enfermedad renal enfermedad heptica recuentos sanguneos bajos, como baja cantidad de glbulos blancos, plaquetas o glbulos rojos en hemodilisis terapia de radiacin en curso o reciente formacin de cicatrices o engrosamiento de los pulmones problemas al orinar una reaccin alrgica o inusual a la ciclofosfamida, a otros medicamentos, alimentos, colorantes o conservantes si est embarazada o buscando quedar embarazada si est amamantando a un beb Cmo debo utilizar este medicamento? Este frmaco se administra generalmente mediante inyeccin en una vena o un msculo, o mediante infusin en una vena. Un profesional de la salud especialmente capacitado lo administra en un hospital o clnica. Hable con su pediatra para informarse acerca del uso de este medicamento en nios. Puede requerir atencin especial. Sobredosis: Pngase en contacto inmediatamente con un centro toxicolgico o una sala de urgencia si usted cree que haya tomado demasiado medicamento. ATENCIN: [employer] es solo para usted. No comparta este medicamento con nadie. Qu sucede si me olvido de una dosis? Es importante no olvidar ninguna dosis. Informe a su mdico o a su profesional de la  salud si no puede asistir a Marketing executive. Qu puede interactuar con este medicamento? anfotericina B azatioprina ciertos medicamentos antivirales para VIH o hepatitis ciertos medicamentos para la presin sangunea, enfermedad cardiaca y frecuencia cardiaca irregular ciertos medicamentos que tratan o previenen cogulos sanguneos, como warfarina ciertos medicamentos adicionales para el cncer ciclosporina etanercept indometacina medicamentos para International aid/development worker los msculos antes de una ciruga medicamentos para aumentar los recuentos sanguneos metronidazol Puede ser que esta lista no menciona todas las posibles interacciones. Informe a su profesional de [employer] de [employer] productos a base de hierbas, medicamentos de [place] o suplementos nutritivos que est tomando. Si usted fuma, consume bebidas alcohlicas o si utiliza drogas ilegales, indqueselo tambin a su profesional de [employer]. Algunas sustancias pueden interactuar con su medicamento. A qu debo estar atento al usar [employer]? Se supervisar su estado de salud atentamente mientras reciba este medicamento. Usted podra necesitar realizarse [employer] de sangre mientras est usando [place]. Beba agua u otros lquidos segn lo indicado. Orine con frecuencia, incluso de noche. Algunos productos pueden contener alcohol. Pregunte a su profesional de la salud si este medicamento contiene alcohol. Asegrese de informar a todos los profesionales de la salud que usted est usando [place]. Ciertos medicamentos, como metronidazol y disulfiram, pueden causar una reaccin desagradable cuando se usan con alcohol. Esta reaccin incluye enrojecimiento, dolor de cabeza, nuseas, vmitos, sudoracin y aumento de la sed. La reaccin puede durar de 30 minutos a varias horas. No debe quedar embarazada mientras est tomando este medicamento o por 1 ao despus de dejar de usarlo. Las mujeres deben informar a su profesional de la salud si  estn buscando quedar embarazadas o si creen que podran estar embarazadas. Los hombres no deben Media planner a Careers information officer estn recibiendo este medicamento y durante 4 meses despus de dejar  de usarlo. Existe la posibilidad de que ocurran efectos secundarios graves en un beb sin nacer. Para obtener ms informacin, hable con su profesional de [employer]. No debe amamantar a un beb mientras est usando este medicamento o por 1 semana despus de dejar de usarlo. Este medicamento ha causado insuficiencia ovrica en [employer]. Este medicamento puede causar dificultades para [place]. Hable con su profesional de la salud si le preocupa su fertilidad. Este medicamento ha causado recuentos de esperma reducidos en algunos hombres. Esto puede hacer ms difcil que un hombre embarace a Nurse, mental health. Hable con su profesional de la salud si le preocupa su fertilidad. Consulte a su profesional de la salud si tiene fiebre, escalofros, dolor de garganta o cualquier otro sntoma de resfro o gripe. No se trate usted mismo. Este medicamento reduce la capacidad del cuerpo para combatir infecciones. Trate de no acercarse a personas que estn enfermas. Evite usar [employer] contienen aspirina, acetaminofeno, ibuprofeno, naproxeno o ketoprofeno, a menos que as lo indique su profesional de [employer]. Estos productos pueden ocultar la fiebre. Hable con su profesional de la salud sobre su riesgo de Database administrator. Usted puede tener mayor riesgo para ciertos tipos de cncer si [place] este medicamento. Si va a someterse a una operacin o a otro procedimiento, informe a su profesional de la salud que est [employer]. Proceda con cuidado al cepillar sus dientes, usar hilo dental o Chemical engineer palillos para los dientes, ya que podra contraer una infeccin o Geophysicist/field seismologist con mayor facilidad. Si se somete a algn tratamiento dental, informe a su dentista que est [employer]. Qu efectos secundarios puedo  tener al [employer] este medicamento? Efectos secundarios que debe informar a su mdico o a Producer, television/film/video de la salud tan pronto como sea posible: Therapist, art, tales como erupcin cutnea, comezn/picazn o urticaria, e hinchazn de la cara, los labios o la lengua problemas para respirar nuseas, vmito signos y sntomas de sangrado, tales como heces con sangre o de color negro y aspecto alquitranado; [place] de color rojo o marrn oscuro; escupir sangre o material marrn que tiene el aspecto de posos (residuos) de caf; Regulatory affairs officer rojas en la piel; sangrado o moretones inusuales en los ojos, las encas o la nariz signos y sntomas de insuficiencia cardiaca tales como frecuencia cardiaca rpida, irregular; aumento repentino de peso; hinchazn de los tobillos, pies, manos signos y sntomas de infeccin, tales como fiebre, escalofros, tos, Engineer, mining de garganta, Engineer, mining o dificultad para Geographical information systems officer signos y sntomas de lesin renal, tales como dificultad para Geographical information systems officer o cambios en la cantidad de orina signos y sntomas de lesin en el hgado, tales como orina amarilla oscura o Child psychotherapist; sensacin general de estar enfermo o sntomas gripales; heces claras; prdida del apetito; nuseas; dolor en la regin abdominal superior derecha; debilidad o cansancio inusuales; color amarillento de los ojos o la piel Efectos secundarios que generalmente no requieren atencin mdica (infrmelos a su mdico o a su profesional de la salud si persisten o si son molestos): confusin disminucin de la audicin diarrea enrojecimiento del rostro cada del cabello dolor de cabeza prdida del apetito falta de perodos menstruales signos y sntomas de baja cantidad de glbulos rojos o anemia, tales como debilidad o cansancio inusuales; sensacin de Research scientist (medical) o aturdimiento; cadas decoloracin de la piel Puede ser que esta lista no menciona todos los posibles efectos secundarios. Comunquese a su mdico por asesoramiento mdico [employer]. Usted puede informar los efectos secundarios a la FDA por  telfono al 1-800-FDA-1088. Dnde debo guardar mi medicina? Este medicamento se administra en hospitales o clnicas, y no necesitar guardarlo en su domicilio. ATENCIN: Este folleto es un resumen. Puede ser que no cubra toda la posible informacin. Si usted tiene preguntas acerca de esta medicina, consulte con su mdico, su farmacutico o su profesional de Radiographer, therapeuticla salud.  2023 Elsevier/Gold Standard (2020-02-01 00:00:00)

## 2022-03-12 ENCOUNTER — Ambulatory Visit: Payer: Managed Care, Other (non HMO) | Admitting: Adult Health

## 2022-03-12 ENCOUNTER — Ambulatory Visit: Payer: 59 | Admitting: Neurology

## 2022-03-17 ENCOUNTER — Encounter: Payer: Self-pay | Admitting: Hematology and Oncology

## 2022-03-20 ENCOUNTER — Other Ambulatory Visit: Payer: 59

## 2022-03-20 ENCOUNTER — Ambulatory Visit: Payer: Managed Care, Other (non HMO)

## 2022-03-20 ENCOUNTER — Ambulatory Visit: Payer: 59 | Admitting: Hematology and Oncology

## 2022-03-27 ENCOUNTER — Other Ambulatory Visit: Payer: Self-pay

## 2022-03-27 ENCOUNTER — Other Ambulatory Visit: Payer: Self-pay | Admitting: Hematology and Oncology

## 2022-03-27 ENCOUNTER — Inpatient Hospital Stay: Payer: Commercial Managed Care - HMO | Attending: Hematology and Oncology

## 2022-03-27 ENCOUNTER — Inpatient Hospital Stay: Payer: Commercial Managed Care - HMO

## 2022-03-27 ENCOUNTER — Inpatient Hospital Stay (HOSPITAL_BASED_OUTPATIENT_CLINIC_OR_DEPARTMENT_OTHER): Payer: Commercial Managed Care - HMO | Admitting: Hematology and Oncology

## 2022-03-27 VITALS — BP 112/68 | HR 67 | Temp 97.1°F | Resp 18 | Wt 174.5 lb

## 2022-03-27 DIAGNOSIS — Z9884 Bariatric surgery status: Secondary | ICD-10-CM | POA: Diagnosis not present

## 2022-03-27 DIAGNOSIS — Z79899 Other long term (current) drug therapy: Secondary | ICD-10-CM | POA: Diagnosis not present

## 2022-03-27 DIAGNOSIS — E538 Deficiency of other specified B group vitamins: Secondary | ICD-10-CM

## 2022-03-27 DIAGNOSIS — D508 Other iron deficiency anemias: Secondary | ICD-10-CM

## 2022-03-27 DIAGNOSIS — D509 Iron deficiency anemia, unspecified: Secondary | ICD-10-CM | POA: Insufficient documentation

## 2022-03-27 LAB — CBC WITH DIFFERENTIAL (CANCER CENTER ONLY)
Abs Immature Granulocytes: 0.01 10*3/uL (ref 0.00–0.07)
Basophils Absolute: 0 10*3/uL (ref 0.0–0.1)
Basophils Relative: 0 %
Eosinophils Absolute: 0 10*3/uL (ref 0.0–0.5)
Eosinophils Relative: 0 %
HCT: 37.5 % (ref 36.0–46.0)
Hemoglobin: 12 g/dL (ref 12.0–15.0)
Immature Granulocytes: 0 %
Lymphocytes Relative: 32 %
Lymphs Abs: 1.5 10*3/uL (ref 0.7–4.0)
MCH: 25.2 pg — ABNORMAL LOW (ref 26.0–34.0)
MCHC: 32 g/dL (ref 30.0–36.0)
MCV: 78.8 fL — ABNORMAL LOW (ref 80.0–100.0)
Monocytes Absolute: 0.3 10*3/uL (ref 0.1–1.0)
Monocytes Relative: 6 %
Neutro Abs: 2.7 10*3/uL (ref 1.7–7.7)
Neutrophils Relative %: 62 %
Platelet Count: 315 10*3/uL (ref 150–400)
RBC: 4.76 MIL/uL (ref 3.87–5.11)
RDW: 16.1 % — ABNORMAL HIGH (ref 11.5–15.5)
WBC Count: 4.5 10*3/uL (ref 4.0–10.5)
nRBC: 0 % (ref 0.0–0.2)

## 2022-03-27 LAB — CMP (CANCER CENTER ONLY)
ALT: 20 U/L (ref 0–44)
AST: 17 U/L (ref 15–41)
Albumin: 4.4 g/dL (ref 3.5–5.0)
Alkaline Phosphatase: 47 U/L (ref 38–126)
Anion gap: 6 (ref 5–15)
BUN: 8 mg/dL (ref 6–20)
CO2: 26 mmol/L (ref 22–32)
Calcium: 9.8 mg/dL (ref 8.9–10.3)
Chloride: 108 mmol/L (ref 98–111)
Creatinine: 0.74 mg/dL (ref 0.44–1.00)
GFR, Estimated: >60 mL/min (ref >60)
Glucose, Bld: 98 mg/dL (ref 70–99)
Potassium: 4.5 mmol/L (ref 3.5–5.1)
Sodium: 140 mmol/L (ref 135–145)
Total Bilirubin: 0.5 mg/dL (ref 0.3–1.2)
Total Protein: 7.7 g/dL (ref 6.5–8.1)

## 2022-03-27 LAB — RETIC PANEL
Immature Retic Fract: 13.4 % (ref 2.3–15.9)
RBC.: 4.8 MIL/uL (ref 3.87–5.11)
Retic Count, Absolute: 49.9 10*3/uL (ref 19.0–186.0)
Retic Ct Pct: 1 % (ref 0.4–3.1)
Reticulocyte Hemoglobin: 26 pg — ABNORMAL LOW (ref >27.9)

## 2022-03-27 LAB — IRON AND IRON BINDING CAPACITY (CC-WL,HP ONLY)
Iron: 92 ug/dL (ref 28–170)
Saturation Ratios: 22 % (ref 10.4–31.8)
TIBC: 426 ug/dL (ref 250–450)
UIBC: 334 ug/dL (ref 148–442)

## 2022-03-27 LAB — FERRITIN: Ferritin: 80 ng/mL (ref 11–307)

## 2022-03-27 NOTE — Progress Notes (Unsigned)
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 Leonides Schanz  12/20-12/27/2021: IV feraheme 510mg  q 7 days x 2 doses and 2 doses of IM Vitamin b12 .  12/26/2020: WBC 4.3, Hgb 11.8, MCV 79.4, Plt 432 01/09/2022: Wbc 5.9, Hgb 12.1, MCV 77.2, Plt 544  Interval History:  Meredith Byrd 41 y.o. female with medical history significant for iron deficiency anemia 2/2 to gastric bipass surgery who presents for a follow up visit. The patient's last visit was on 09/05/2021. In the interim since the last visit she has had no major changes in her health.   On exam today Meredith Byrd reports persistent fatigue although she continues to complete all her daily activities on her own.  She reports her appetite is stable.  She denies any nausea, vomiting or abdominal pain.  She reports occasional episodes of constipation with a bowel movement every 2 to 3 days.  She reports enlarged lymph node in the right submandibular region that she feels is getting bigger in the past year.  It is nontender but she adds that she has had night sweats for the last 2 years, currently on clonidine.  She denies fevers, chills, shortness of breath, chest pain or cough.She is had no other changes in her health at this time.  A full 10 point ROS is listed below.  MEDICAL HISTORY:  Past Medical History:  Diagnosis Date   Allergy    Anemia    Anxiety     Arthritis pain 08/20/2014   Back pain    COVID-19    GERD (gastroesophageal reflux disease)    History of stress test 02/2011 ([facility])   there was no evidence of ischemia, but she only went 3 1/2 minutes on the treadmill making it very difficult to get a good accurate assessment, however   Hx of echocardiogram    The echocadiogram was essentially normal with the exception of mild mitral calcification and borderline concentric LVH, which in the setting of her hypertension at this early age is something that mean her blood pressure is well controlled.    Hypertension    Iron deficiency anemia, unspecified 07/09/2014   Migraines    Palpitations    Swelling    Vitamin B12 deficiency 03/17/2016    SURGICAL HISTORY: Past Surgical History:  Procedure Laterality Date   BREAST SURGERY     CARPAL TUNNEL RELEASE Right 09/24/2021   Procedure: RIGHT CARPAL TUNNEL RELEASE;  Surgeon: 09/26/2021, MD;  Location: [facility];  Service: Orthopedics;  Laterality: Right;   CHOLECYSTECTOMY     GASTRIC BYPASS  2008   OOPHORECTOMY Right 2010   REDUCTION MAMMAPLASTY Bilateral     SOCIAL HISTORY: Social History   Socioeconomic History   Marital status: Married    Spouse name: Damon   Number of children: 4   Years of education: Associates   Highest education level: Not on file  Occupational History   Occupation: Insurance Claims  Tobacco Use  Smoking status: Never   Smokeless tobacco: Never  Vaping Use   Vaping Use: Never used  Substance and Sexual Activity   Alcohol use: No   Drug use: No   Sexual activity: Yes  Other Topics Concern   Not on file  Social History Narrative   Right handed.   Lives at home with husband and four children.   No caffeine use.   Social Determinants of Health   Financial Resource Strain: Not on file  Food Insecurity: Not on file  Transportation Needs: Not on file  Physical Activity: Not on file  Stress: Not on file  Social Connections: Not  on file  Intimate Partner Violence: Not on file    FAMILY HISTORY: Family History  Problem Relation Age of Onset   Asthma Mother    Heart disease Mother    Diabetes Mother    Early death Mother    Hypertension Mother    Thyroid disease Mother    Anxiety disorder Mother    Hypertension Father    Heart disease Father    Breast cancer Sister 67   Breast cancer Maternal Aunt        under 61   Breast cancer Paternal Aunt    Stroke Maternal Grandmother    Heart disease Maternal Grandmother    Hypertension Maternal Grandmother    Hyperlipidemia Maternal Grandmother    Breast cancer Maternal Grandmother     ALLERGIES:  is allergic to doxycycline, fioricet [butalbital-apap-caffeine], inderal [propranolol], and nsaids.  MEDICATIONS:  Current Outpatient Medications  Medication Sig Dispense Refill   acetaminophen (TYLENOL) 500 MG tablet Take 2 tablets (1,000 mg total) by mouth every 6 (six) hours as needed. 30 tablet 0   acetaminophen-codeine (TYLENOL #3) 300-30 MG tablet Take 1 tablet by mouth 2 (two) times daily as needed for moderate pain. 30 tablet 0   albuterol (VENTOLIN HFA) 108 (90 Base) MCG/ACT inhaler Inhale 1-2 puffs into the lungs every 6 (six) hours as needed for wheezing or shortness of breath. 18 g 0   celecoxib (CELEBREX) 200 MG capsule Take 1 capsule (200 mg total) by mouth 2 (two) times daily. 30 capsule 3   cloNIDine (CATAPRES) 0.2 MG tablet Take 0.4 mg by mouth at bedtime.     Fremanezumab-vfrm (AJOVY) 225 MG/1.5ML SOAJ Inject 225 mg into the skin every 30 (thirty) days. 1.68 mL 5   HYDROcodone-acetaminophen (NORCO) 5-325 MG tablet Take 1 tablet by mouth 3 (three) times daily as needed. 15 tablet 0   levocetirizine (XYZAL) 5 MG tablet Take 5 mg by mouth every evening.     medroxyPROGESTERone (DEPO-PROVERA) 150 MG/ML injection      montelukast (SINGULAIR) 10 MG tablet Take 10 mg by mouth at bedtime.     mupirocin ointment (BACTROBAN) 2 % Apply 1 application topically  daily. 22 g 2   traMADol (ULTRAM) 50 MG tablet Take 1-2 tablets (50-100 mg total) by mouth daily as needed. 20 tablet 0   Ubrogepant (UBRELVY) 100 MG TABS Take 1 tablet at the onset of migraine. Can repeat in 2 hours if needed. Only 2 tabs/24 hours 15 tablet 11   UNABLE TO FIND Med Name: allergy injections  3 times a week     Vitamin D, Ergocalciferol, (DRISDOL) 1.25 MG (50000 UNIT) CAPS capsule Take 1 capsule (50,000 Units total) by mouth every 7 (seven) days. 5 capsule 3   Current Facility-Administered Medications  Medication Dose Route Frequency Provider Last Rate Last Admin   diclofenac Sodium (VOLTAREN) 1 %  topical gel 4 g  4 g Topical QID Eldred Manges, MD        REVIEW OF SYSTEMS:   Constitutional: ( - ) fevers, ( - )  chills , ( - ) night sweats Eyes: ( - ) blurriness of vision, ( - ) double vision, ( - ) watery eyes Ears, nose, mouth, throat, and face: ( - ) mucositis, ( - ) sore throat Respiratory: ( - ) cough, ( - ) dyspnea, ( - ) wheezes Cardiovascular: ( - ) palpitation, ( - ) chest discomfort, ( - ) lower extremity swelling Gastrointestinal:  ( - ) nausea, ( - ) heartburn, ( - ) change in bowel habits Skin: ( - ) abnormal skin rashes Lymphatics: ( - ) new lymphadenopathy, ( - ) easy bruising Neurological: ( - ) numbness, ( - ) tingling, ( - ) new weaknesses Behavioral/Psych: ( - ) mood change, ( - ) new changes  All other systems were reviewed with the patient and are negative.  PHYSICAL EXAMINATION:  Vitals:   03/27/22 1014  BP: 112/68  Pulse: 67  Resp: 18  Temp: (!) 97.1 F (36.2 C)  SpO2: 100%     Filed Weights   03/27/22 1014  Weight: 174 lb 8 oz (79.2 kg)    GENERAL: tired appearing middle aged [place] American female. alert, no distress and comfortable SKIN: skin color, texture, turgor are normal, no rashes or significant lesions EYES: conjunctiva are pink and non-injected, sclera clear LYMPH: Mildly enlarged right submandibular lymph node,  nontender LUNGS: clear to auscultation and percussion with normal breathing effort HEART: regular rate & rhythm and no murmurs and no lower extremity edema Musculoskeletal: no cyanosis of digits and no clubbing  PSYCH: alert & oriented x 3, fluent speech NEURO: no focal motor/sensory deficits  LABORATORY DATA:  I have reviewed the data as listed    Latest Ref Rng & Units 03/27/2022    9:47 AM 01/09/2022    1:37 PM 11/14/2021    2:16 PM  CBC  WBC 4.0 - 10.5 K/uL 4.5  5.9  4.6   Hemoglobin 12.0 - 15.0 g/dL 20.2  54.2  70.6   Hematocrit 36.0 - 46.0 % 37.5  38.7  39.9   Platelets 150 - 400 K/uL 315  544  545        Latest Ref Rng & Units 01/09/2022    1:37 PM 11/14/2021    2:16 PM 09/05/2021    8:04 AM  CMP  Glucose 70 - 99 mg/dL 237  97  628   BUN 6 - 20 mg/dL 5  6  7    Creatinine 0.44 - 1.00 mg/dL  3.15  1.76   Sodium 135 - 145 mmol/L 138  139  141   Potassium 3.5 - 5.1 mmol/L 4.0  3.5  4.0   Chloride 98 - 111 mmol/L 106  103  108   CO2 22 - 32 mmol/L 25  25  23    Calcium 8.9 - 10.3 mg/dL 9.7  9.8  9.1   Total Protein 6.5 - 8.1 g/dL 8.5  8.5  7.2   Total Bilirubin 0.3 - 1.2 mg/dL 0.5  0.5  0.6   Alkaline Phos 38 - 126 U/L 58  62  59   AST 15 - 41 U/L 16  16  20    ALT 0 - 44 U/L 18  17  27      RADIOGRAPHIC STUDIES: I have personally reviewed the radiological images as  listed and agreed with the findings in the report. No results found.  ASSESSMENT & PLAN Meredith Byrd 41 y.o. female with medical history significant for iron deficiency anemia 2/2 to gastric bipass surgery who presents for a follow up visit.  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  --Hgb 12.0 today, stable. B12 have improved to 447, No evidence of iron deficiency.  --Currently  receiving IM B12 injection 1000 mcg q 2 weeks. Recommend to continue. --No need for IV iron at this time.  --Return to clinic in 3 months   #Fatigue, stable --Patient still has marked fatigue despite adequate repletion of iron and vitamin B12.   --normal TSH and hemoglobin A1c from 12/26/2020 --Folate levels have normalized from 03/21/2021 --There is evidence of vitamin D deficiency today so sent prescription of vitamin D supplementation.    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   #Enlarged right submandibular nodule: --Ordered US head/neck for further evaluation.   No orders of the defined types were placed in this encounter.  All questions were answered. The patient knows to call the clinic with any problems, questions or concerns.  I have spent a total of 30 minutes minutes of face-to-face and non-face-to-face time, preparing to see the patient, obtaining and/or reviewing separately obtained history, performing a medically appropriate examination, counseling and educating the patient, ordering medications/tests, documenting clinical information in the electronic health record, and care coordination.   Ulysees Barns, MD Department of Hematology/Oncology [facility] Phone: [phone] Pager: [phone] Email: [email or web address]    03/27/2022 10:36 AM

## 2022-03-28 ENCOUNTER — Inpatient Hospital Stay: Payer: Commercial Managed Care - HMO

## 2022-03-28 ENCOUNTER — Other Ambulatory Visit: Payer: Self-pay

## 2022-03-28 VITALS — BP 108/69 | HR 77 | Temp 98.1°F

## 2022-03-28 DIAGNOSIS — D508 Other iron deficiency anemias: Secondary | ICD-10-CM

## 2022-03-28 DIAGNOSIS — E538 Deficiency of other specified B group vitamins: Secondary | ICD-10-CM | POA: Diagnosis not present

## 2022-03-28 MED ORDER — CYANOCOBALAMIN 1000 MCG/ML IJ SOLN
1000.0000 ug | Freq: Once | INTRAMUSCULAR | Status: AC
  Administered 2022-03-28: 1000 ug via INTRAMUSCULAR
  Filled 2022-03-28: qty 1

## 2022-03-31 ENCOUNTER — Encounter: Payer: Self-pay | Admitting: Orthopaedic Surgery

## 2022-03-31 ENCOUNTER — Ambulatory Visit: Payer: Commercial Managed Care - HMO | Admitting: Orthopaedic Surgery

## 2022-03-31 VITALS — BP 105/74 | HR 71 | Ht 60.0 in | Wt 174.0 lb

## 2022-03-31 DIAGNOSIS — M1712 Unilateral primary osteoarthritis, left knee: Secondary | ICD-10-CM

## 2022-03-31 DIAGNOSIS — M1711 Unilateral primary osteoarthritis, right knee: Secondary | ICD-10-CM

## 2022-03-31 MED ORDER — METHYLPREDNISOLONE ACETATE 40 MG/ML IJ SUSP
40.0000 mg | INTRAMUSCULAR | Status: AC | PRN
  Administered 2022-03-31: 40 mg via INTRA_ARTICULAR

## 2022-03-31 MED ORDER — BUPIVACAINE HCL 0.5 % IJ SOLN
3.0000 mL | INTRAMUSCULAR | Status: AC | PRN
  Administered 2022-03-31: 3 mL via INTRA_ARTICULAR

## 2022-03-31 MED ORDER — LIDOCAINE HCL 1 % IJ SOLN
0.5000 mL | INTRAMUSCULAR | Status: AC | PRN
  Administered 2022-03-31: .5 mL

## 2022-03-31 MED ORDER — BUPIVACAINE HCL 0.25 % IJ SOLN
4.0000 mL | INTRAMUSCULAR | Status: AC | PRN
  Administered 2022-03-31: 4 mL via INTRA_ARTICULAR

## 2022-03-31 NOTE — Progress Notes (Signed)
Office Visit Note   Patient: Meredith Byrd           Date of Birth: 1981-05-10           MRN: [id number] Visit Date: 03/31/2022              Requested by: Irena Reichmann, DO [address] [place],  [place] [address] PCP: Irena Reichmann, DO   Assessment & Plan: Visit Diagnoses:  1. Primary osteoarthritis of left knee   2. Primary osteoarthritis of right knee     Plan: Bilateral knee injections performed.  We had discussed single knee injection but with her weight loss patient wants to proceed with both.  She can follow-up if she has ongoing problems with her knees.  Follow-Up Instructions: No follow-ups on file.   Orders:  No orders of the defined types were placed in this encounter.  No orders of the defined types were placed in this encounter.     Procedures: Large Joint Inj: R knee on 03/31/2022 8:29 AM Indications: pain and joint swelling Details: 22 G 1.5 in needle, anterolateral approach  Arthrogram: No  Medications: 40 mg methylPREDNISolone acetate 40 MG/ML; 0.5 mL lidocaine 1 %; 4 mL bupivacaine 0.25 % Outcome: tolerated well, no immediate complications Procedure, treatment alternatives, risks and benefits explained, specific risks discussed. Consent was given by the patient. Immediately prior to procedure a time out was called to verify the correct patient, procedure, equipment, support staff and site/side marked as required. Patient was prepped and draped in the usual sterile fashion.    Large Joint Inj: L knee on 03/31/2022 8:29 AM Indications: joint swelling and pain Details: 22 G 1.5 in needle, anterolateral approach  Arthrogram: No  Medications: 0.5 mL lidocaine 1 %; 3 mL bupivacaine 0.5 %; 40 mg methylPREDNISolone acetate 40 MG/ML Outcome: tolerated well, no immediate complications Procedure, treatment alternatives, risks and benefits explained, specific risks discussed. Consent was given by the patient. Immediately prior to procedure a  time out was called to verify the correct patient, procedure, equipment, support staff and site/side marked as required. Patient was prepped and draped in the usual sterile fashion.       Clinical Data: No additional findings.   Subjective: Chief Complaint  Patient presents with   Right Knee - Pain   Left Knee - Pain    HPI 41 year old female returns she is lost some weight and has been walking with some increased knee discomfort.  She had previous injection in her shoulder with good relief and now is requesting injection in her knee where she has minimal joint line narrowing and small osteophyte.  No locking or giving way no falling.  She had previous gastric bypass surgery.  Shoulder postinjection has been doing well.  Review of Systems all the systems noncontributory to HPI.   Objective: Vital Signs: BP 105/74   Pulse 71   Ht 5' (1.524 m)   Wt 174 lb (78.9 kg)   BMI 33.98 kg/m   Physical Exam Constitutional:      Appearance: She is well-developed.  HENT:     Head: Normocephalic.     Right Ear: External ear normal.     Left Ear: External ear normal. There is no impacted cerumen.  Eyes:     Pupils: Pupils are equal, round, and reactive to light.  Neck:     Thyroid: No thyromegaly.     Trachea: No tracheal deviation.  Cardiovascular:     Rate and Rhythm: Normal  rate.  Pulmonary:     Effort: Pulmonary effort is normal.  Abdominal:     Palpations: Abdomen is soft.  Musculoskeletal:     Cervical back: No rigidity.  Skin:    General: Skin is warm and dry.  Neurological:     Mental Status: She is alert and oriented to person, place, and time.  Psychiatric:        Behavior: Behavior normal.     Ortho Exam patient has normal heel-toe gait.  Mild discomfort with medial and lateral joint line palpation minimal crepitus.  Negative logroll the hips.  No cellulitis.  Anterior tib gastrocsoleus is active.  Specialty Comments:  No specialty comments  available.  Imaging: No results found.   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   On Depo-Provera for contraception 05/31/2019   Chronic insomnia 05/17/2019   Lumbar facet arthropathy 12/14/2018   Dysmenorrhea, unspecified 05/16/2018   Knee pain, left 11/17/2017   Generalized anxiety disorder 10/04/2017   Morning headache 05/04/2017   Daytime somnolence 05/04/2017   Class 3 obesity with body mass index (BMI) of 45.0 to 49.9 in adult 03/28/2017   Chronic low back pain 01/19/2017   Vitamin D deficiency 01/18/2017   Vitamin B12 deficiency 03/17/2016   Beta thalassemia trait 03/25/2015   Intractable migraine without aura and without status migrainosus 11/23/2014   Elevated sed rate 08/20/2014   Arthritis pain 08/20/2014   Iron deficiency anemia 07/09/2014   Symptomatic anemia 06/05/2014   Chest pain 05/01/2014   GERD (gastroesophageal reflux disease) 03/22/2014   S/P gastric bypass 03/22/2014   Insomnia 03/22/2014   Migraine 03/22/2014   Palpitations 03/22/2014   Past Medical History:  Diagnosis Date   Allergy    Anemia    Anxiety    Arthritis pain 08/20/2014   Back pain    COVID-19    GERD (gastroesophageal reflux disease)    History of stress test 02/2011 ([facility])   there was no evidence of ischemia, but she only went 3 1/2 minutes on the treadmill making it very difficult to get a good accurate assessment, however   Hx of echocardiogram    The echocadiogram was essentially normal with the exception of mild mitral calcification and borderline concentric LVH, which in the setting of her hypertension at this early age is something that mean her blood pressure is well controlled.     Hypertension    Iron deficiency anemia, unspecified 07/09/2014   Migraines    Palpitations    Swelling    Vitamin B12 deficiency 03/17/2016    Family History  Problem Relation Age of Onset   Asthma Mother    Heart disease Mother    Diabetes Mother    Early death Mother    Hypertension Mother    Thyroid disease Mother    Anxiety disorder Mother    Hypertension Father    Heart disease Father    Breast cancer Sister 66   Breast cancer Maternal Aunt        under 31   Breast cancer Paternal Aunt    Stroke Maternal Grandmother    Heart disease Maternal Grandmother    Hypertension Maternal Grandmother    Hyperlipidemia Maternal Grandmother  Breast cancer Maternal Grandmother     Past Surgical History:  Procedure Laterality Date   BREAST SURGERY     CARPAL TUNNEL RELEASE Right 09/24/2021   Procedure: RIGHT CARPAL TUNNEL RELEASE;  Surgeon: Tarry Kos, MD;  Location: [facility];  Service: Orthopedics;  Laterality: Right;   CHOLECYSTECTOMY     GASTRIC BYPASS  2008   OOPHORECTOMY Right 2010   REDUCTION MAMMAPLASTY Bilateral    Social History   Occupational History   Occupation: Human resources officer  Tobacco Use   Smoking status: Never   Smokeless tobacco: Never  Vaping Use   Vaping Use: Never used  Substance and Sexual Activity   Alcohol use: No   Drug use: No   Sexual activity: Yes

## 2022-04-05 ENCOUNTER — Encounter: Payer: Self-pay | Admitting: Hematology and Oncology

## 2022-04-11 ENCOUNTER — Inpatient Hospital Stay: Payer: Commercial Managed Care - HMO | Attending: Hematology and Oncology

## 2022-04-11 VITALS — BP 119/73 | HR 76 | Temp 98.6°F | Resp 16

## 2022-04-11 DIAGNOSIS — D508 Other iron deficiency anemias: Secondary | ICD-10-CM

## 2022-04-11 DIAGNOSIS — Z79899 Other long term (current) drug therapy: Secondary | ICD-10-CM | POA: Insufficient documentation

## 2022-04-11 DIAGNOSIS — D509 Iron deficiency anemia, unspecified: Secondary | ICD-10-CM | POA: Diagnosis present

## 2022-04-11 DIAGNOSIS — E538 Deficiency of other specified B group vitamins: Secondary | ICD-10-CM | POA: Insufficient documentation

## 2022-04-11 MED ORDER — CYANOCOBALAMIN 1000 MCG/ML IJ SOLN
1000.0000 ug | Freq: Once | INTRAMUSCULAR | Status: AC
  Administered 2022-04-11: 1000 ug via INTRAMUSCULAR

## 2022-04-25 ENCOUNTER — Inpatient Hospital Stay: Payer: Commercial Managed Care - HMO

## 2022-04-25 VITALS — BP 136/91 | HR 102 | Temp 98.1°F | Resp 16

## 2022-04-25 DIAGNOSIS — D508 Other iron deficiency anemias: Secondary | ICD-10-CM

## 2022-04-25 DIAGNOSIS — E538 Deficiency of other specified B group vitamins: Secondary | ICD-10-CM

## 2022-04-25 MED ORDER — CYANOCOBALAMIN 1000 MCG/ML IJ SOLN
1000.0000 ug | Freq: Once | INTRAMUSCULAR | Status: AC
  Administered 2022-04-25: 1000 ug via INTRAMUSCULAR
  Filled 2022-04-25: qty 1

## 2022-05-08 ENCOUNTER — Inpatient Hospital Stay: Payer: Commercial Managed Care - HMO

## 2022-05-08 ENCOUNTER — Other Ambulatory Visit: Payer: Commercial Managed Care - HMO

## 2022-05-09 ENCOUNTER — Inpatient Hospital Stay: Payer: Commercial Managed Care - HMO

## 2022-05-09 ENCOUNTER — Other Ambulatory Visit: Payer: Self-pay

## 2022-05-09 VITALS — BP 114/73 | HR 80 | Temp 99.0°F

## 2022-05-09 DIAGNOSIS — D508 Other iron deficiency anemias: Secondary | ICD-10-CM

## 2022-05-09 DIAGNOSIS — E538 Deficiency of other specified B group vitamins: Secondary | ICD-10-CM | POA: Diagnosis not present

## 2022-05-09 MED ORDER — CYANOCOBALAMIN 1000 MCG/ML IJ SOLN
1000.0000 ug | Freq: Once | INTRAMUSCULAR | Status: AC
  Administered 2022-05-09: 1000 ug via INTRAMUSCULAR
  Filled 2022-05-09: qty 1

## 2022-05-14 ENCOUNTER — Other Ambulatory Visit: Payer: Self-pay | Admitting: Physician Assistant

## 2022-05-18 ENCOUNTER — Encounter: Payer: Self-pay | Admitting: Hematology and Oncology

## 2022-05-18 ENCOUNTER — Telehealth: Payer: Self-pay | Admitting: Neurology

## 2022-05-18 NOTE — Telephone Encounter (Signed)
Unable to reach by phone- sent mychart msg informing pt of r/s needed for 8/14 appt. She is scheduled for 7:30 and Dr. Terrace Arabia is no longer starting that early.

## 2022-05-22 ENCOUNTER — Other Ambulatory Visit: Payer: Self-pay | Admitting: Family Medicine

## 2022-05-22 DIAGNOSIS — Z1231 Encounter for screening mammogram for malignant neoplasm of breast: Secondary | ICD-10-CM

## 2022-05-23 ENCOUNTER — Inpatient Hospital Stay: Payer: Commercial Managed Care - HMO | Attending: Hematology and Oncology

## 2022-05-23 DIAGNOSIS — E538 Deficiency of other specified B group vitamins: Secondary | ICD-10-CM | POA: Diagnosis present

## 2022-05-23 DIAGNOSIS — Z79899 Other long term (current) drug therapy: Secondary | ICD-10-CM | POA: Insufficient documentation

## 2022-05-23 DIAGNOSIS — D509 Iron deficiency anemia, unspecified: Secondary | ICD-10-CM | POA: Insufficient documentation

## 2022-05-23 DIAGNOSIS — D508 Other iron deficiency anemias: Secondary | ICD-10-CM

## 2022-05-23 MED ORDER — CYANOCOBALAMIN 1000 MCG/ML IJ SOLN
1000.0000 ug | Freq: Once | INTRAMUSCULAR | Status: AC
  Administered 2022-05-23: 1000 ug via INTRAMUSCULAR

## 2022-05-25 ENCOUNTER — Ambulatory Visit: Payer: Commercial Managed Care - HMO | Admitting: Neurology

## 2022-05-27 ENCOUNTER — Other Ambulatory Visit: Payer: Self-pay | Admitting: Physician Assistant

## 2022-05-28 ENCOUNTER — Encounter: Payer: Self-pay | Admitting: Hematology and Oncology

## 2022-06-01 ENCOUNTER — Other Ambulatory Visit: Payer: Self-pay | Admitting: Physician Assistant

## 2022-06-02 ENCOUNTER — Encounter: Payer: Self-pay | Admitting: Hematology and Oncology

## 2022-06-06 ENCOUNTER — Inpatient Hospital Stay: Payer: Commercial Managed Care - HMO

## 2022-06-06 VITALS — BP 117/71 | Temp 98.1°F | Resp 18

## 2022-06-06 DIAGNOSIS — E538 Deficiency of other specified B group vitamins: Secondary | ICD-10-CM | POA: Diagnosis not present

## 2022-06-06 DIAGNOSIS — D508 Other iron deficiency anemias: Secondary | ICD-10-CM

## 2022-06-06 MED ORDER — CYANOCOBALAMIN 1000 MCG/ML IJ SOLN
1000.0000 ug | Freq: Once | INTRAMUSCULAR | Status: AC
  Administered 2022-06-06: 1000 ug via INTRAMUSCULAR

## 2022-06-06 NOTE — Patient Instructions (Signed)
Vitamin B12 and Folate Test Why am I having this test? Vitamin B12 and folate (folic acid) are B vitamins needed to make red blood cells and keep your nervous system healthy. Vitamin B12 is in foods such as meats, eggs, dairy products, and fish. Folate is in fruits, beans, and leafy green vegetables. Some foods, such as whole grains, bread, and cereals have vitamin B12 added to them (are fortified). You may not have enough of these B vitamins (have a deficiency) if your diet lacks these vitamins. Low levels can also be caused by diseases or having had surgeries on your stomach or small intestine that interfere with your ability to absorb the vitamins from your food. You may have a vitamin B12 and folate test if: You have symptoms of vitamin B12 or folate deficiency, such as tiredness (fatigue), headache, confusion, poor balance, or tingling and numbness in your hands and feet. You are pregnant or breastfeeding. Women who are pregnant or breastfeeding need more folate and may need to take dietary supplements. Your red blood cell count is low (anemia). You are an older person and have mental confusion. You have a disease or condition that may lead to a deficiency of these B vitamins. What is being tested? This test measures the amount of vitamin B12 and folate in your blood. The tests for vitamin B12 and folate may be done together or separately. What kind of sample is taken?  A blood sample is required for this test. It is usually collected by inserting a needle into a blood vessel. How do I prepare for this test? Follow instructions from your health care provider about eating and drinking before the test. Tell a health care provider about: All medicines you are taking, including vitamins, herbs, eye drops, creams, and over-the-counter medicines. Any medical conditions you have. Whether you are pregnant or may be pregnant. How often you drink alcohol. How are the results reported? Your test  results will be reported as values that identify the amount of vitamin B12 and folate in your blood. Your health care provider will compare your results to normal ranges that were established after testing a large group of people (reference ranges). Reference ranges may vary among labs and hospitals. For this test, common reference ranges are: Vitamin B12: 160-950 pg/mL or 118-701 pmol/L (SI units). Folate: 5-25 ng/mL or 11-57 nmol/L (SI units). What do the results mean? Results within the reference range are considered normal. Vitamin B12 or folate levels that are lower than the reference range may be caused by: Poor nutrition or eating a vegetarian or vegan diet that does not include any foods that come from animals. Having alcoholism. Having certain diseases that make it hard to absorb vitamin B12. These diseases include Crohn's disease, chronic pancreatitis, and cystic fibrosis. Taking certain medicines. Having had surgeries on your stomach or small intestine. High levels of vitamin B12 are rare, but they may happen if you have: Cancer. Liver disease. High levels of folate may happen if: You have anemia. You are vegetarian. You have had a recent blood transfusion. Talk with your health care provider about what your results mean. Questions to ask your health care provider Ask your health care provider, or the department that is doing the test: When will my results be ready? How will I get my results? What are my treatment options? What other tests do I need? What are my next steps? Summary Vitamin B12 and folate (folic acid) are both B vitamins that are needed to   make red blood cells and to keep your nervous system healthy. You may not have enough B vitamins in your body if you do not get enough in your diet or if you have a disease that makes it hard to absorb vitamin B12. This test measures the amount of vitamin B12 and folate in your blood. A blood sample is required for the  test. Talk with your health care provider about what your results mean. This information is not intended to replace advice given to you by your health care provider. Make sure you discuss any questions you have with your health care provider. Document Revised: 05/23/2021 Document Reviewed: 05/23/2021 Elsevier Patient Education  2023 Elsevier Inc. 

## 2022-06-16 ENCOUNTER — Telehealth: Payer: Self-pay | Admitting: Hematology and Oncology

## 2022-06-16 ENCOUNTER — Ambulatory Visit (INDEPENDENT_AMBULATORY_CARE_PROVIDER_SITE_OTHER): Payer: Commercial Managed Care - HMO | Admitting: Clinical

## 2022-06-16 DIAGNOSIS — F419 Anxiety disorder, unspecified: Secondary | ICD-10-CM
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
## 2022-06-16 NOTE — Telephone Encounter (Signed)
Called pt 2x per 9/5 in basket, was sent to voicemail both times

## 2022-06-17 ENCOUNTER — Telehealth: Payer: Self-pay | Admitting: *Deleted

## 2022-06-17 NOTE — Telephone Encounter (Signed)
Received call from pt. She is asking why her appts with Dr. Leonides Schanz are every 3 months now. They used to be every 6 months and she prefers to come every 6 months. She did cancel her appts for 06/19/22 but is keeping her B12 injection appt on Saturday-which she does prefer.  Please advise.

## 2022-06-18 ENCOUNTER — Inpatient Hospital Stay: Payer: Commercial Managed Care - HMO

## 2022-06-18 ENCOUNTER — Inpatient Hospital Stay: Payer: Commercial Managed Care - HMO | Admitting: Hematology and Oncology

## 2022-06-19 ENCOUNTER — Ambulatory Visit: Payer: Commercial Managed Care - HMO

## 2022-06-19 ENCOUNTER — Ambulatory Visit: Payer: Commercial Managed Care - HMO | Admitting: Hematology and Oncology

## 2022-06-19 ENCOUNTER — Other Ambulatory Visit: Payer: Commercial Managed Care - HMO

## 2022-06-20 ENCOUNTER — Inpatient Hospital Stay: Payer: Commercial Managed Care - HMO | Attending: Hematology and Oncology

## 2022-06-20 VITALS — BP 112/69 | HR 64 | Temp 97.9°F | Resp 18

## 2022-06-20 DIAGNOSIS — D508 Other iron deficiency anemias: Secondary | ICD-10-CM

## 2022-06-20 DIAGNOSIS — Z79899 Other long term (current) drug therapy: Secondary | ICD-10-CM | POA: Insufficient documentation

## 2022-06-20 DIAGNOSIS — E538 Deficiency of other specified B group vitamins: Secondary | ICD-10-CM | POA: Diagnosis present

## 2022-06-20 DIAGNOSIS — D509 Iron deficiency anemia, unspecified: Secondary | ICD-10-CM | POA: Insufficient documentation

## 2022-06-20 MED ORDER — CYANOCOBALAMIN 1000 MCG/ML IJ SOLN
1000.0000 ug | Freq: Once | INTRAMUSCULAR | Status: AC
  Administered 2022-06-20: 1000 ug via INTRAMUSCULAR

## 2022-06-25 ENCOUNTER — Other Ambulatory Visit: Payer: Self-pay

## 2022-06-25 DIAGNOSIS — D508 Other iron deficiency anemias: Secondary | ICD-10-CM

## 2022-06-26 ENCOUNTER — Inpatient Hospital Stay: Payer: Commercial Managed Care - HMO

## 2022-06-26 DIAGNOSIS — D508 Other iron deficiency anemias: Secondary | ICD-10-CM

## 2022-06-26 DIAGNOSIS — E538 Deficiency of other specified B group vitamins: Secondary | ICD-10-CM | POA: Diagnosis not present

## 2022-06-26 LAB — CMP (CANCER CENTER ONLY)
ALT: 14 U/L (ref 0–44)
AST: 15 U/L (ref 15–41)
Albumin: 3.9 g/dL (ref 3.5–5.0)
Alkaline Phosphatase: 45 U/L (ref 38–126)
Anion gap: 4 — ABNORMAL LOW (ref 5–15)
BUN: 10 mg/dL (ref 6–20)
CO2: 26 mmol/L (ref 22–32)
Calcium: 8.8 mg/dL — ABNORMAL LOW (ref 8.9–10.3)
Chloride: 109 mmol/L (ref 98–111)
Creatinine: 0.57 mg/dL (ref 0.44–1.00)
GFR, Estimated: >60 mL/min (ref >60)
Glucose, Bld: 82 mg/dL (ref 70–99)
Potassium: 3.7 mmol/L (ref 3.5–5.1)
Sodium: 139 mmol/L (ref 135–145)
Total Bilirubin: 0.4 mg/dL (ref 0.3–1.2)
Total Protein: 7.2 g/dL (ref 6.5–8.1)

## 2022-06-26 LAB — CBC WITH DIFFERENTIAL (CANCER CENTER ONLY)
Abs Immature Granulocytes: 0.01 10*3/uL (ref 0.00–0.07)
Basophils Absolute: 0 10*3/uL (ref 0.0–0.1)
Basophils Relative: 0 %
Eosinophils Absolute: 0 10*3/uL (ref 0.0–0.5)
Eosinophils Relative: 1 %
HCT: 35.5 % — ABNORMAL LOW (ref 36.0–46.0)
Hemoglobin: 11.4 g/dL — ABNORMAL LOW (ref 12.0–15.0)
Immature Granulocytes: 0 %
Lymphocytes Relative: 33 %
Lymphs Abs: 1.7 10*3/uL (ref 0.7–4.0)
MCH: 25.9 pg — ABNORMAL LOW (ref 26.0–34.0)
MCHC: 32.1 g/dL (ref 30.0–36.0)
MCV: 80.5 fL (ref 80.0–100.0)
Monocytes Absolute: 0.3 10*3/uL (ref 0.1–1.0)
Monocytes Relative: 5 %
Neutro Abs: 3.1 10*3/uL (ref 1.7–7.7)
Neutrophils Relative %: 61 %
Platelet Count: 363 10*3/uL (ref 150–400)
RBC: 4.41 MIL/uL (ref 3.87–5.11)
RDW: 15.7 % — ABNORMAL HIGH (ref 11.5–15.5)
WBC Count: 5.2 10*3/uL (ref 4.0–10.5)
nRBC: 0 % (ref 0.0–0.2)

## 2022-06-26 LAB — IRON AND IRON BINDING CAPACITY (CC-WL,HP ONLY)
Iron: 37 ug/dL (ref 28–170)
Saturation Ratios: 10 % — ABNORMAL LOW (ref 10.4–31.8)
TIBC: 384 ug/dL (ref 250–450)
UIBC: 347 ug/dL

## 2022-06-26 LAB — FERRITIN: Ferritin: 40 ng/mL (ref 11–307)

## 2022-06-26 LAB — RETIC PANEL
Immature Retic Fract: 15.8 % (ref 2.3–15.9)
RBC.: 4.44 MIL/uL (ref 3.87–5.11)
Retic Count, Absolute: 62.2 10*3/uL (ref 19.0–186.0)
Retic Ct Pct: 1.4 % (ref 0.4–3.1)
Reticulocyte Hemoglobin: 25 pg — ABNORMAL LOW (ref >27.9)

## 2022-06-30 ENCOUNTER — Ambulatory Visit: Payer: Commercial Managed Care - HMO | Admitting: Orthopaedic Surgery

## 2022-06-30 ENCOUNTER — Ambulatory Visit: Payer: Self-pay

## 2022-06-30 ENCOUNTER — Ambulatory Visit (INDEPENDENT_AMBULATORY_CARE_PROVIDER_SITE_OTHER): Payer: Commercial Managed Care - HMO

## 2022-06-30 DIAGNOSIS — M79672 Pain in left foot: Secondary | ICD-10-CM | POA: Diagnosis not present

## 2022-06-30 DIAGNOSIS — M79671 Pain in right foot: Secondary | ICD-10-CM | POA: Diagnosis not present

## 2022-06-30 NOTE — Progress Notes (Signed)
Office Visit Note   Patient: Meredith Byrd           Date of Birth: 12-Dec-1980           MRN: [id number] Visit Date: 06/30/2022              Requested by: Irena Reichmann, DO [address] [place],  [place] [address] PCP: Irena Reichmann, DO   Assessment & Plan: Visit Diagnoses:  1. Bilateral foot pain     Plan: Impression is bilateral foot plantar fasciitis.  I believe her symptoms started as planter fasciitis and due likely walking with an altered gait she has developed posterior tibial tendinitis to the left ankle in addition to peroneal tendinitis to the right ankle.  I have discussed cortisone injections bracing, oral steroids in addition to plantar fasciitis stretches.  She would like to proceed with a short course of oral steroids in addition to plantar fascia stretches for now.  Have also discussed that she could buy [employer] feet to be fitted for orthotics.  She will let us know if her symptoms do not improve.  Call with concerns or questions.  Follow-Up Instructions: Return if symptoms worsen or fail to improve.   Orders:  Orders Placed This Encounter  Procedures   XR Foot Complete Left   XR Foot Complete Right   No orders of the defined types were placed in this encounter.     Procedures: No procedures performed   Clinical Data: No additional findings.   Subjective: Chief Complaint  Patient presents with   Left Foot - Pain   Right Foot - Pain    HPI patient is a pleasant 41 year old female who comes in today with bilateral foot pain right greater than left for the past year.  She denies any injury or change in activity.  She does note she is status post gastric bypass surgery and has been on a healthy journey for the past few years where she has been walking.  She has recently become full caregiver for her husband as well.  The pain she has started in the bottom of her feet and has since started to radiate to the lateral foot.  Symptoms are  worse at the end of the day after she has been sitting for a long time working from home.  She has tried wearing different shoes as well as different arch supports and using hot soaks and Tylenol without relief.  She is unable to take NSAIDs due to history of gastric bypass.  Review of Systems as detailed in HPI.  All others are negative.   Objective: Vital Signs: There were no vitals taken for this visit.  Physical Exam well-developed and well-nourished female in no acute distress.  Alert and oriented x3.  Ortho Exam bilateral foot exam: Right foot exam reveals moderate tenderness to the heel at the plantar fascia insertion.  Increased pain with resisted inversion and eversion.  Tenderness along the peroneal tendon.  Dorsiflexion to neutral.  Left foot exam: Tenderness to palpitation plantar fascia attachment at the heel.  Moderate tenderness to the posterior tibial tendon.  Dorsiflexion to neutral.  She is neurovascular intact distally.  Specialty Comments:  No specialty comments available.  Imaging: XR Foot Complete Right  Result Date: 06/30/2022 No acute or structural abnormalities  XR Foot Complete Left  Result Date: 06/30/2022 No acute or structural abnormalities    PMFS History: Patient Active Problem List   Diagnosis Date Noted   Primary osteoarthritis  of left knee 03/31/2022   Primary osteoarthritis of right knee 03/31/2022   Chronic pain of right knee 06/06/2021   Right carpal tunnel syndrome 01/23/2021   No-show for appointment 11/25/2020   Chondromalacia patellae, right knee 07/12/2020   On Depo-Provera for contraception 05/31/2019   Chronic insomnia 05/17/2019   Lumbar facet arthropathy 12/14/2018   Dysmenorrhea, unspecified 05/16/2018   Knee pain, left 11/17/2017   Generalized anxiety disorder 10/04/2017   Morning headache 05/04/2017   Daytime somnolence 05/04/2017   Class 3 obesity with body mass index (BMI) of 45.0 to 49.9 in adult 03/28/2017   Chronic low  back pain 01/19/2017   Vitamin D deficiency 01/18/2017   Vitamin B12 deficiency 03/17/2016   Beta thalassemia trait 03/25/2015   Intractable migraine without aura and without status migrainosus 11/23/2014   Elevated sed rate 08/20/2014   Arthritis pain 08/20/2014   Iron deficiency anemia 07/09/2014   Symptomatic anemia 06/05/2014   Chest pain 05/01/2014   GERD (gastroesophageal reflux disease) 03/22/2014   S/P gastric bypass 03/22/2014   Insomnia 03/22/2014   Migraine 03/22/2014   Palpitations 03/22/2014   Past Medical History:  Diagnosis Date   Allergy    Anemia    Anxiety    Arthritis pain 08/20/2014   Back pain    COVID-19    GERD (gastroesophageal reflux disease)    History of stress test 02/2011 ([facility])   there was no evidence of ischemia, but she only went 3 1/2 minutes on the treadmill making it very difficult to get a good accurate assessment, however   Hx of echocardiogram    The echocadiogram was essentially normal with the exception of mild mitral calcification and borderline concentric LVH, which in the setting of her hypertension at this early age is something that mean her blood pressure is well controlled.    Hypertension    Iron deficiency anemia, unspecified 07/09/2014   Migraines    Palpitations    Swelling    Vitamin B12 deficiency 03/17/2016    Family History  Problem Relation Age of Onset   Asthma Mother    Heart disease Mother    Diabetes Mother    Early death Mother    Hypertension Mother    Thyroid disease Mother    Anxiety disorder Mother    Hypertension Father    Heart disease Father    Breast cancer Sister 64   Breast cancer Maternal Aunt        under 44   Breast cancer Paternal Aunt    Stroke Maternal Grandmother    Heart disease Maternal Grandmother    Hypertension Maternal Grandmother    Hyperlipidemia Maternal Grandmother    Breast cancer Maternal Grandmother     Past Surgical History:  Procedure Laterality Date   BREAST SURGERY      CARPAL TUNNEL RELEASE Right 09/24/2021   Procedure: RIGHT CARPAL TUNNEL RELEASE;  Surgeon: Leandrew Koyanagi, MD;  Location: [facility];  Service: Orthopedics;  Laterality: Right;   CHOLECYSTECTOMY     GASTRIC BYPASS  2008   OOPHORECTOMY Right 2010   REDUCTION MAMMAPLASTY Bilateral    Social History   Occupational History   Occupation: [employer] Services operational officer  Tobacco Use   Smoking status: Never   Smokeless tobacco: Never  Vaping Use   Vaping Use: Never used  Substance and Sexual Activity   Alcohol use: No   Drug use: No   Sexual activity: Yes

## 2022-07-01 ENCOUNTER — Other Ambulatory Visit: Payer: Self-pay | Admitting: Physician Assistant

## 2022-07-01 ENCOUNTER — Telehealth: Payer: Self-pay | Admitting: Orthopaedic Surgery

## 2022-07-01 ENCOUNTER — Ambulatory Visit (INDEPENDENT_AMBULATORY_CARE_PROVIDER_SITE_OTHER): Payer: Commercial Managed Care - HMO | Admitting: Clinical

## 2022-07-01 DIAGNOSIS — F419 Anxiety disorder, unspecified: Secondary | ICD-10-CM

## 2022-07-01 MED ORDER — ACETAMINOPHEN-CODEINE 300-30 MG PO TABS: 1.0000 | ORAL_TABLET | Freq: Two times a day (BID) | ORAL | 0 refills | Status: DC | PRN

## 2022-07-01 MED ORDER — PREDNISONE 10 MG (21) PO TBPK: ORAL_TABLET | ORAL | 0 refills | Status: DC

## 2022-07-01 NOTE — Telephone Encounter (Signed)
sent 

## 2022-07-01 NOTE — Progress Notes (Signed)
Testing Visit Documentation    Name: Meredith Byrd   MRN: [id number]  Date of Birth: November 06, 1980     Age: 41 y.o.  Date of Visit 07/01/22    Type of Service Provided Psychological Testing  Type of Contact: in-person  Location: office Those present at Session: Chad Cordial   Session Note: [name] presented for testing session.  The following assessments were administered and/or scored: ADOS-2, Stanford-Binet 5, and DSM-5 Cross cutting symptom measure, various DSM-5 symptom specific measures.  [name] also participated in a semi-structured interview regarding symptoms of ASD and anxiety.  The following assessments were sent: SRS 2 (self and other), [place]   Examiner checked again and there is no one who would be a good reporter from [name]'s childhood so these measures were not sent.   Mental Status Exam: Appearance:  Casual and Well Groomed     Behavior: Appropriate  Motor: Restlestness  Speech/Language:  Clear and Coherent  Affect: Appropriate  Mood: normal  Thought process: normal  Thought content:   WNL  Sensory/Perceptual disturbances:   WNL  Orientation: oriented to person, place, time/date, and situation  Attention: Good  Concentration: Good  Memory: WNL  Fund of knowledge:  Good  Insight:   Good  Judgment:  Fair  Impulse Control: Good    Plan: [name] will return for feedback in October.  A report will be included in the chart once the evaluation is complete.   Time Spent:   Test Administration (Face-to-Face): 07/01/2022; 8:55 am - 12:30 pm (215 minutes)  Scoring (non-face-to-face): 07/01/2022; 12:35 pm - 1:20 pm (45 minutes)  Initial integration/Report Generation: 07/01/2022, 3:15 pm - 4:15 pm & 9:00 pm -9:30 pm, 07/02/2022, 8:25 am - 8:50 am, 07/03/2022, 10:40 am - 11:00 am (135 minutes)  To be billed once evaluation is complete on last date of service:  96136 = 1 unit  96137 = 8 units 96130 = 1 unit 96131 = 1 units  Information   Given information obtained during the intake interview, additional information was gathered from patient regarding the symptoms of ASD and anxiety. This is a portion of a more comprehensive evaluation and should not be interpreted in isolation.. Please see the completed diagnostic evaluation for more information.    Semi-Structured Self-Report Interview Based on ADI-R and SCQ:  Social communication and social interaction skills  A1: Deficits in social-emotional reciprocity, ranging, for example, from abnormal social approach and failure of normal back-and-forth conversation; to reduced sharing of interests, emotions, or affect; to failure to initiate or respond to social interactions.  Social challenges across the lifespan: all rough   Challenges with conversations: constantly taking over people  - Talk too much: don't know where to interject -sometimes when people pause think it is my turn but was not really  - Challenges with small talk: Yes - the most typical is about weather and stuff that seems stupid - talk about something meaningful but to chit-chat don't do that - never done that   Challenges with social conventions and niceties (starting and ending conversations): Yes - always say 'hi, how are you doing and that is just my go do - with face to face conversations at work would complete the check in process and patients were still talk to her and was wondering for what because they were done - job is complete so why still talking  - not going to pretend it is okay if not - will not say someone's hair is cute if it is  not   Challenges with social judgement: Yes - don't know when people are flirting - I bite my lips and would do that when younger and people would come over to me and were thinking that she was doing something - did not know why they were coming over - not doing it flirtatiously but people thought they were  - was looking around and would catch someone's eye but was not  trying to   Challenges with understanding subtle social cues: hard - unless say don't want to talk about this anymore and we need to change the subject  -do not beat around the bush - say what they want to say - people say different words to say what they mean instead of just saying what they mean and I don't do that  - kids all the time - she will tell them like it is and kids are upset   Challenges with understanding humor, being too literal: Yes - very literal, say what she means and assumes that other people are doing that well - don't get the joke or the sarcasm -   - above has always been true for her   A2: Deficits in nonverbal communicative behaviors used for social interaction, ranging, for example, from poorly integrated verbal and nonverbal communication; to abnormalities in eye contact and body language or deficits in understanding and use of gestures; to a total lack of facial expressions and nonverbal communication.  Challenges with eye contact across lifespan: Yes - cant stare too long - during interviews is the worst - as an adult the worst because supposed to look but will find something else to look at - never been able to do that   Do people seem to be able to teel how you are feeling from your facial expressions alone: always look like she is mad but she is not - gets asked 50 times a day if she is okay - she if fine and people   Do you have difficulty recognizing and responding to other's expressions: if tone does not match what is said then totally confused - husband is disabled and he says it is painful - ask him everyday how he is doing and he says he is fine but he was just moaning in pain -   - if happy and have smile know happy - if have a blank face don't know expression - don't know how confused and surprised look   Any challenges understanding or responding to nonverbal communication:   Yes   A3: Deficits in developing, maintaining, and understanding relationships,  ranging, for example, from difficulties adjusting behavior to suit various social contexts; to difficulties in sharing imaginative play or in making friends; to absence of interest in peers.  Were you interested in peers as a young child: wanted friends because knew kids played together but once they were there did not really want them there - wanted someone close by   - had people over and they would be doing something together and Sheriece would be playing in same room doing something else  - was into Barbie - did not want people to be playing with her Barbie stuff, wanted them to play with the separate Barbie stuff but in the same area - made sense to her but not everyone was happy with that arrangement- had enough for everyone to play with separately - wanted to have Barbie go to mall so she would have mall stuff and the peers would  be playing with Barbie at home - when they tried to join into her play she would turn away or eventually ask them to go back to their space  - for as far back as can remember  - kids don't like that - did not realize it at the time but realize now that it might have been why friends started trickling off - wanted someone in same room but not in personal space   - peers were looking for someone to play with and I wanted someone to play near  Growing up did you have get-together's with peers: Yes  Growing up did you have preferred peer/best friend: no, everyone was the same  - never had a best friend    Do you understanding how to adjust behavior to fit different situations: can be like a chameleon - try to watch and mimic - if going out with friends and to dinner and movie prepare so knows what she is going to eat, plan conversations they could have and what she could talk about that other people that might want to talk about  - out on wife persona with husband  - when with kids put on the mom role  - hard to know who she really is - when take all those hats off  and get to the rawness of [place] likes to dance, bounce on feet, sway side to side, does not like to start new shows until done with one like   Have you been happy with your ability to have relationships across your lifespan (including friendships and romantic relationships): okay with relationships had - prefer to be on own - when go out will be one-on-one or maybe 2 people - otherwise with bigger groups feel lost -    Restricted, repetitive patterns of behavior, interests, or activities B1: Stereotyped or repetitive motor movements, use of objects, or speech (e.g., simple motor stereotypies, lining up toys or flipping objects, echolalia, idiosyncratic phrases).    Have you ever shown: Repetitive play (lining up, sorting toys, organizing toys): when younger would organize dolls - when done would take them apart and pile legs, arms, heads, etc  Very focused on toys that spin, twirl, drop, etc.: love things that blow in the wind Something you carried with them all the time: when little had to have Barbie and cabbage patch doll with her   Interest in the parts of toys: No  Repetitive body movements (finger mannerisms, hand flapping, toe walking, spinning): Yes - pace - rub over each finger - will pull hair and put it under he noise and mouth - sway side to side - bounce of feet - do finger movements - might hand flap a bit when excited - have always done them - when little would get a sheet and put it on her head to fidget with - (feel and pull on) -because hair was not long - as a kid was always spinning and loved the playground equipment that would spin   Repetitive or stereotyped speech (e.g., starts conversations the same way, repeats others/media, used 'you' instead of "I"): No  B2: Insistence on sameness, inflexible adherence to routines, or ritualized patterns of verbal or nonverbal behavior (e.g., extreme distress at small changes, difficulties with transitions, rigid  thinking patterns, greeting rituals, need to take same route or eat same food every day).    Have you ever shown: Difficulty with transitions: Yes  - in the summer time have to get  up an hour before work and need to work self up into going to work  - cant get just into the shower - needs a 15 minute warm up to do it - need time to mentally prepare for things -  - have time blindness - think everything take 5 minutes - will say I will be there in 30 minutes but project takes an hour - if have an appointment cannot plan anything for the day outside of the appointment because it causes me anxiety that I will be late - being late is the bane of my existence - needs to be the first one there and know the lay out (where restrooms are) - just to feel safe    Hard time stopping if interrupted or activity not complete: Yes - makes me mad - I am doing this and that is it   Difficulty with changes: Yes - go to store and right now [employer] spicy chicken is safe food - have been eating for months - went to the store and they were out of it and she went into meltdown mode - eat the same food for breakfast and lunch - was on [employer] ravioli for 6 months - every day for breakfast and lunch  - now [employer] chicken sandwich  - at dinner eat chicken, rice or mashed potatoes and corn is always the vegetable - try to make things for people that would be appealing  - have in mind what plan to do for dinner so don't bend on that   - always been a favorite food for a period of time - when younger it was mashed potatoes and corn for almost every dinner -parents tried to get her to eat peas, beans, etc but wont eat that   - don't ask me to do anything last minute - not in the plan it is a no -do not like spontaneity- need to know ahead of time    Rigidity/routines/things he/she insists on: Yes - have several (see above)  Cognitive Inflexibility: Yes - no grey area - have standard operating procedures at work -  needs it and will ask if not provided - grey area is always bad - ask for a step-action chart   - all foods have to have the same ingredients- sandwich has to be uniform throughout - eats foods in a circle to have all foods on the fork at the same time - if don't have equal amounts to have some thing in every bite then meal is done - when out in public don't eat a lot because cannot eat the way she likes - very rigid in my thinking, no room for grey - don't like things with room for interpretation- no grey area - has to make sense and be logical   B3: Highly restricted, fixated interests that are abnormal in intensity or focus (e.g., strong attachment to or preoccupation with unusual objects, excessively circumscribed or perseverative interests).  Have you ever shown: Intense interests: Yes - Barbie as a kid - played with Barbie until she was 78 - had 2 dream houses and 2 cars and every Barbie imaginable - had spot in the basement and would play with it there- played with it for much longer than others - one reason that friends may have stopped coming by because by 11 was mostly just her playing Barbie alone - played with them everyday when home from school and most of the summer  Reading -  has been a passion - always have a book in her hand   Unusual interests: go down rabbit holes - will do extensive research when hearing a story that catches her attention happens about 3 times a month and lasts for 2-3 days   B4: Hyper- or hyporeactivity to sensory input or unusual interest in sensory aspects of the environment (e.g., apparent indifference to pain/temperature, adverse response to specific sounds or textures, excessive smelling or touching of objects, visual fascination with lights or movement).  Have you ever shown: Visual interests: No Visual Aversions: bright lights bother her - [employer] is huge sensory their lights are horrible - Tactile Interests: love velvet and silky - don't like  rough  Tactile Aversions: don't like hands wet, don't like people touching her when wet - don't like people touching her - can tolerate kids touching her but don't like it- don't like sticky things   Smelling/mouthing of unusual objects: don't like pungent smells - wearing a mask was the worst because don't like things touching her face - don't like anything smelly - change cat litter twice a day due to concerns with smells   Auditory interests: love music - headphones on and listen for the beat and percussion in the back - don't listen to the lyrics   Auditory Aversions: don't like clacking, chalk on a chalkboard - would get in trouble in school when teacher would write it on board because she did not want to stay in the room due to the sound - don't like loud noises except for music that she controls - driving is a nightmare because so much going on the road and don't like honking horns   Food aversions: things that are slimy or middle that is soft and outside is hard - husband made crockpot lasagna and there was crumbled sausage in there and was not the texture expecting so could not eat it - all has to have the same consistency   Unusual responses to pain: No Unusual responses to temperature: Cannot regulate temperature so always hot - have multiple fans and have temperature set very particular way and as soon as it is adjusted she notices   Sensory and foods needing to be a certain way have always been true for her    Anxiety  GAD: constantly worried about son that moved out, the state of the nation and what is going to happen next, future   Excessive worry: not constantly talking about it but on her mind  - more days when have worries than no worries   More than 6 months: off and on yes   Worry is hard to control:Yes  Physical symptoms - restlessness or on edge: No - easily fatigued: Yes  - cant concentrate: Yes  - mind goes blank: No - irritability: Yes   - muscle tension:  No - sleep disturbance: Yes    Social Anxiety:  Persistent, intense fear or anxiety about specific social situations because due to fear of being judged negatively, embarrassed or humiliated: Yes  - worry about saying the wrong thing - had to take a public speaking class for degree and hard to get through   Avoidance of anxiety-producing social situations or enduring them with intense fear or anxiety: Yes - will avoid them at all costs - if I have to do something will try to push self through it but it still brings great anxiety   Excessive anxiety that's out of proportion to the situation: Yes  Anxiety or distress that interferes with your daily living: Not now because work from home - are on camera for certain work things - have more anxiety on those days (have a meeting on camera once every 2 weeks) - otherwise try to limit social things  - like being alone and not necessarily things would do with less social anxiety     Ronnie Derby, PhD

## 2022-07-01 NOTE — Telephone Encounter (Signed)
Pt called and styates she was suppose to have tylenol #3 and prednisone called in.    Pt states that the pharmacy she wants is [facility] on the corner on [address].   Please call when sent in   Cb [phone]

## 2022-07-03 ENCOUNTER — Ambulatory Visit
Admission: RE | Admit: 2022-07-03 | Discharge: 2022-07-03 | Disposition: A | Discharge status: Home / Self Care | Payer: Commercial Managed Care - HMO | Source: Ambulatory Visit | Attending: Family Medicine | Admitting: Family Medicine

## 2022-07-03 DIAGNOSIS — Z1231 Encounter for screening mammogram for malignant neoplasm of breast: Secondary | ICD-10-CM

## 2022-07-04 ENCOUNTER — Inpatient Hospital Stay: Payer: Commercial Managed Care - HMO

## 2022-07-04 VITALS — BP 133/80 | HR 97 | Temp 97.9°F | Resp 18 | Ht 60.0 in

## 2022-07-04 DIAGNOSIS — D508 Other iron deficiency anemias: Secondary | ICD-10-CM

## 2022-07-04 DIAGNOSIS — E538 Deficiency of other specified B group vitamins: Secondary | ICD-10-CM

## 2022-07-04 MED ORDER — CYANOCOBALAMIN 1000 MCG/ML IJ SOLN
1000.0000 ug | Freq: Once | INTRAMUSCULAR | Status: AC
  Administered 2022-07-04: 1000 ug via INTRAMUSCULAR

## 2022-07-18 ENCOUNTER — Inpatient Hospital Stay: Payer: Commercial Managed Care - HMO | Attending: Hematology and Oncology

## 2022-07-18 VITALS — BP 110/64 | HR 83 | Temp 97.9°F | Resp 18

## 2022-07-18 DIAGNOSIS — E538 Deficiency of other specified B group vitamins: Secondary | ICD-10-CM | POA: Insufficient documentation

## 2022-07-18 DIAGNOSIS — D508 Other iron deficiency anemias: Secondary | ICD-10-CM

## 2022-07-18 MED ORDER — CYANOCOBALAMIN 1000 MCG/ML IJ SOLN
1000.0000 ug | Freq: Once | INTRAMUSCULAR | Status: AC
  Administered 2022-07-18: 1000 ug via INTRAMUSCULAR

## 2022-07-20 ENCOUNTER — Ambulatory Visit (HOSPITAL_COMMUNITY)
Admission: RE | Admit: 2022-07-20 | Discharge: 2022-07-20 | Disposition: A | Discharge status: Home / Self Care | Payer: Commercial Managed Care - HMO | Source: Ambulatory Visit | Attending: Internal Medicine | Admitting: Internal Medicine

## 2022-07-20 ENCOUNTER — Encounter (HOSPITAL_COMMUNITY): Payer: Self-pay

## 2022-07-20 ENCOUNTER — Ambulatory Visit (INDEPENDENT_AMBULATORY_CARE_PROVIDER_SITE_OTHER): Payer: Commercial Managed Care - HMO

## 2022-07-20 VITALS — BP 117/65 | HR 70 | Temp 98.7°F | Resp 18

## 2022-07-20 DIAGNOSIS — J069 Acute upper respiratory infection, unspecified: Secondary | ICD-10-CM | POA: Insufficient documentation

## 2022-07-20 DIAGNOSIS — R059 Cough, unspecified: Secondary | ICD-10-CM | POA: Insufficient documentation

## 2022-07-20 DIAGNOSIS — Z1152 Encounter for screening for COVID-19: Secondary | ICD-10-CM | POA: Diagnosis not present

## 2022-07-20 MED ORDER — BENZONATATE 100 MG PO CAPS: 100.0000 mg | ORAL_CAPSULE | Freq: Three times a day (TID) | ORAL | 0 refills | Status: DC | PRN

## 2022-07-20 NOTE — ED Provider Notes (Signed)
[facility]    CSN: [id number] Arrival date & time: 07/20/22  1255      History   Chief Complaint Chief Complaint  Patient presents with   appt 1    HPI Meredith Byrd is a 41 y.o. female comes to the urgent care with 3 to 4-day history of cough, nasal congestion ear fullness and left ear pain.  Patient's symptoms started since Thursday or Friday and has been persistent.  Patient was not forthcoming with given history because she says that she is told to people already.  She appears to be irritated by direct questioning about his symptoms.  She complains of pain on the left side of her breastbone with cough.  It is unclear to me if the patient has fever or not.  I am not able to obtain a history of nasal itching or throat itching.  No shortness of breath.[name]   HPI  Past Medical History:  Diagnosis Date   Allergy    Anemia    Anxiety    Arthritis pain 08/20/2014   Back pain    COVID-19    GERD (gastroesophageal reflux disease)    History of stress test 02/2011 ([facility])   there was no evidence of ischemia, but she only went 3 1/2 minutes on the treadmill making it very difficult to get a good accurate assessment, however   Hx of echocardiogram    The echocadiogram was essentially normal with the exception of mild mitral calcification and borderline concentric LVH, which in the setting of her hypertension at this early age is something that mean her blood pressure is well controlled.    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   On Depo-Provera  for contraception 05/31/2019   Chronic insomnia 05/17/2019   Lumbar facet arthropathy 12/14/2018   Dysmenorrhea, unspecified 05/16/2018   Knee pain, left 11/17/2017   Generalized anxiety disorder 10/04/2017   Morning headache 05/04/2017   Daytime somnolence 05/04/2017   Class 3 obesity with body mass index (BMI) of 45.0 to 49.9 in adult 03/28/2017   Chronic low back pain 01/19/2017   Vitamin D deficiency 01/18/2017   Vitamin B12 deficiency 03/17/2016   Beta thalassemia trait 03/25/2015   Intractable migraine without aura and without status migrainosus 11/23/2014   Elevated sed rate 08/20/2014   Arthritis pain 08/20/2014   Iron deficiency anemia 07/09/2014   Symptomatic anemia 06/05/2014   Chest pain 05/01/2014   GERD (gastroesophageal reflux disease) 03/22/2014   S/P gastric bypass 03/22/2014   Insomnia 03/22/2014   Migraine 03/22/2014   Palpitations 03/22/2014    Past Surgical History:  Procedure Laterality Date   BREAST SURGERY     CARPAL TUNNEL RELEASE Right 09/24/2021   Procedure: RIGHT CARPAL TUNNEL RELEASE;  Surgeon: Leandrew Koyanagi, MD;  Location: [facility];  Service: Orthopedics;  Laterality: Right;   CHOLECYSTECTOMY     GASTRIC BYPASS  2008   OOPHORECTOMY Right 2010   REDUCTION MAMMAPLASTY Bilateral     OB History     Gravida  4  Para  4   Term  4   Preterm      AB      Living         SAB      IAB      Ectopic      Multiple      Live Births               Home Medications    Prior to Admission medications   Medication Sig Start Date End Date Taking? Authorizing Provider  acetaminophen (TYLENOL) 500 MG tablet Take 2 tablets (1,000 mg total) by mouth every 6 (six) hours as needed. 10/31/18   Wieters, Hallie C, PA-C  acetaminophen-codeine (TYLENOL #3) 300-30 MG tablet Take 1 tablet by mouth 2 (two) times daily as needed for moderate pain. 07/01/22   Cristie Hem, PA-C  albuterol (VENTOLIN HFA) 108 (90 Base) MCG/ACT  inhaler Inhale 1-2 puffs into the lungs every 6 (six) hours as needed for wheezing or shortness of breath. 06/26/20   Particia Nearing, PA-C  benzonatate (TESSALON) 100 MG capsule Take 1 capsule (100 mg total) by mouth 3 (three) times daily as needed for cough. 07/20/22   LampteyBritta Mccreedy, MD  cloNIDine (CATAPRES) 0.2 MG tablet Take 0.4 mg by mouth at bedtime. 05/20/20   [provider]  Fremanezumab-vfrm (AJOVY) 225 MG/1.5ML SOAJ Inject 225 mg into the skin every 30 (thirty) days. 09/12/21   Butch Penny, NP  levocetirizine (XYZAL) 5 MG tablet Take 5 mg by mouth every evening.    [provider]  medroxyPROGESTERone (DEPO-PROVERA) 150 MG/ML injection     [provider]  montelukast (SINGULAIR) 10 MG tablet Take 10 mg by mouth at bedtime.    [provider]  Ubrogepant (UBRELVY) 100 MG TABS Take 1 tablet at the onset of migraine. Can repeat in 2 hours if needed. Only 2 tabs/24 hours 09/12/21   Butch Penny, NP  UNABLE TO FIND Med Name: allergy injections  3 times a week    [provider]  Vitamin D, Ergocalciferol, (DRISDOL) 1.25 MG (50000 UNIT) CAPS capsule TAKE 1 CAPSULE BY MOUTH EVERY 7 DAYS 05/14/22   Briant Cedar, PA-C    Family History Family History  Problem Relation Age of Onset   Asthma Mother    Heart disease Mother    Diabetes Mother    Early death Mother    Hypertension Mother    Thyroid disease Mother    Anxiety disorder Mother    Hypertension Father    Heart disease Father    Breast cancer Sister 64   Breast cancer Maternal Aunt        under 53   Breast cancer Paternal Aunt    Stroke Maternal Grandmother    Heart disease Maternal Grandmother    Hypertension Maternal Grandmother    Hyperlipidemia Maternal Grandmother    Breast cancer Maternal Grandmother     Social History Social History   Tobacco Use   Smoking status: Never   Smokeless tobacco: Never  Vaping Use   Vaping Use: Never used  Substance Use  Topics   Alcohol use: No   Drug use: No     Allergies   Doxycycline, Fioricet [butalbital-apap-caffeine], Inderal [propranolol], and Nsaids   Review of Systems Review of Systems  Unable to perform ROS: Other     Physical Exam Triage Vital Signs ED Triage Vitals  Enc Vitals Group     BP 07/20/22 1310 117/65  Pulse Rate 07/20/22 1310 70     Resp 07/20/22 1310 18     Temp 07/20/22 1310 98.7 F (37.1 C)     Temp Source 07/20/22 1310 Oral     SpO2 07/20/22 1310 100 %     Weight --      Height --      Head Circumference --      Peak Flow --      Pain Score 07/20/22 1308 0     Pain Loc --      Pain Edu? --      Excl. in GC? --    No data found.  Updated Vital Signs BP 117/65 (BP Location: Left Arm)   Pulse 70   Temp 98.7 F (37.1 C) (Oral)   Resp 18   SpO2 100%   Visual Acuity Right Eye Distance:   Left Eye Distance:   Bilateral Distance:    Right Eye Near:   Left Eye Near:    Bilateral Near:     Physical Exam Vitals and nursing note reviewed.  Constitutional:      General: She is not in acute distress.    Appearance: She is not ill-appearing.  HENT:     Right Ear: Tympanic membrane normal.     Left Ear: Tympanic membrane normal.     Mouth/Throat:     Mouth: Mucous membranes are moist.     Pharynx: No oropharyngeal exudate or posterior oropharyngeal erythema.  Eyes:     Extraocular Movements: Extraocular movements intact.     Pupils: Pupils are equal, round, and reactive to light.  Musculoskeletal:     Cervical back: Normal range of motion and neck supple.  Skin:    General: Skin is warm.  Neurological:     Mental Status: She is alert.      UC Treatments / Results  Labs (all labs ordered are listed, but only abnormal results are displayed) Labs Reviewed  SARS CORONAVIRUS 2 (TAT 6-24 HRS)    EKG   Radiology DG Chest 2 View  Result Date: 07/20/2022 CLINICAL DATA:  Cough, nasal congestion EXAM: CHEST - 2 VIEW COMPARISON:  06/26/2020  FINDINGS: Heart and mediastinal contours are within normal limits. No focal opacities or effusions. No acute bony abnormality. Nodular densities projecting over both lower lungs felt to represent nipple shadows. IMPRESSION: No active cardiopulmonary disease. Electronically Signed   By: Charlett Nose M.D.   On: 07/20/2022 13:51    Procedures Procedures (including critical care time)  Medications Ordered in UC Medications - No data to display  Initial Impression / Assessment and Plan / UC Course  I have reviewed the triage vital signs and the nursing notes.  Pertinent labs & imaging results that were available during my care of the patient were reviewed by me and considered in my medical decision making (see chart for details).     1.  Viral URI with cough: COVID-19 PCR test has been sent Patient requested a chest x-ray.  Chest x-ray was normal. Humidifier and vapor rub use recommended We will call patient with recommendations if COVID is positive Tessalon Perles as needed for cough Patient appeared unhappy with the visit. Return precautions given Final Clinical Impressions(s) / UC Diagnoses   Final diagnoses:  Viral URI with cough     Discharge Instructions      Humidiifer and vapor rub use will help with nasal congestion. Increase oral fluid intake We will call you with recommendation if labs are abnormal  Return to urgent care if symptoms worsen.   ED Prescriptions     Medication Sig Dispense Auth. Provider   benzonatate (TESSALON) 100 MG capsule  (Status: Discontinued) Take 1 capsule (100 mg total) by mouth 3 (three) times daily as needed for cough. 21 capsule Deiondre Harrower, Britta Mccreedy, MD   benzonatate (TESSALON) 100 MG capsule Take 1 capsule (100 mg total) by mouth 3 (three) times daily as needed for cough. 21 capsule Lee-Ann Gal, Britta Mccreedy, MD      PDMP not reviewed this encounter.   Merrilee Jansky, MD 07/20/22 1416

## 2022-07-20 NOTE — ED Triage Notes (Signed)
Pt reports since Thursday or Friday having cough, congestion, ear fullness and left ear little painful. Taking allergy medications, nasal spray and other OTC meds without relief.

## 2022-07-20 NOTE — Discharge Instructions (Signed)
Humidiifer and vapor rub use will help with nasal congestion. Increase oral fluid intake We will call you with recommendation if labs are abnormal Return to urgent care if symptoms worsen.

## 2022-07-20 NOTE — ED Notes (Addendum)
Blank note entered in error.

## 2022-07-21 LAB — SARS CORONAVIRUS 2 (TAT 6-24 HRS): SARS Coronavirus 2: NEGATIVE

## 2022-07-25 ENCOUNTER — Ambulatory Visit: Payer: Commercial Managed Care - HMO

## 2022-07-28 ENCOUNTER — Ambulatory Visit (INDEPENDENT_AMBULATORY_CARE_PROVIDER_SITE_OTHER): Payer: Commercial Managed Care - HMO | Admitting: Clinical

## 2022-07-28 DIAGNOSIS — F840 Autistic disorder: Secondary | ICD-10-CM

## 2022-07-28 DIAGNOSIS — F411 Generalized anxiety disorder: Secondary | ICD-10-CM | POA: Diagnosis not present

## 2022-07-28 NOTE — Progress Notes (Signed)
Testing Visit Documentation    Name: Meredith Byrd    MRN: [id number]  Date of Birth: Nov 21, 1980     Age: 41 y.o.  Date of Visit 07/28/22    Type of Service Provided Psychological Testing (Feedback session) Type of Contact: in-person  Location: office  Visit Information: Meredith Byrd presented for the results of the evaluation. No new concerns were reported since last visit. Results of the assessment were reviewed and interpreted for Meredith Byrd, including information that supported a diagnosis of ASD and GAD. Recommendations were provided. A full written report will be completed and shared with Meredith Byrd. Please see the completed report for more detailed information regarding background information, testing results and interpretation.   Plan: Evaluation complete - appropriate referrals and recommendations for next steps made.    Time Spent as part of the current visit:  Additional integration/Report Generation: 07/19/2022, 4:00 pm- 4:50 pm, 07/24/2022, 2:30 pm -3:30 pm, 07/27/2022, 6:00 pm - 6:30 pm  (140 minutes)  Time spent in Interactive Feedback Session: 07/28/2022, 10:58 am - 12:06 pm (68 minutes)   Please see the notes from dates of services 07/01/2022 for additional documentation of times spent and units that are to be billed  Total billing (including from the current session and prior dates of service listed above) is as follows:  Total spent in Test Administration and Scoring across all testing visits: 260 minutes  Units billed: 96136 = 1 unit  96137 = 8 units  Total time spend in Testing Evaluation Services including, but not limited to, the integrative feedback session and integration/report generation: 343 minutes  Units billed: 96130 = 1 unit 96131 = 5 units     Zara Chess, PhD

## 2022-08-01 ENCOUNTER — Inpatient Hospital Stay: Payer: Commercial Managed Care - HMO

## 2022-08-01 VITALS — BP 100/63 | HR 67 | Temp 97.5°F

## 2022-08-01 DIAGNOSIS — E538 Deficiency of other specified B group vitamins: Secondary | ICD-10-CM | POA: Diagnosis not present

## 2022-08-01 DIAGNOSIS — D508 Other iron deficiency anemias: Secondary | ICD-10-CM

## 2022-08-01 MED ORDER — CYANOCOBALAMIN 1000 MCG/ML IJ SOLN
1000.0000 ug | Freq: Once | INTRAMUSCULAR | Status: AC
  Administered 2022-08-01: 1000 ug via INTRAMUSCULAR
  Filled 2022-08-01: qty 1

## 2022-08-01 NOTE — Patient Instructions (Signed)
Vitamin B12 Deficiency Vitamin B12 deficiency occurs when the body does not have enough of this important vitamin. The body needs this vitamin: To make red blood cells. To make DNA. This is the genetic material inside cells. To help the nerves work properly so they can carry messages from the brain to the body. Vitamin B12 deficiency can cause health problems, such as not having enough red blood cells in the blood (anemia). This can lead to nerve damage if untreated. What are the causes? This condition may be caused by: Not eating enough foods that contain vitamin B12. Not having enough stomach acid and digestive fluids to properly absorb vitamin B12 from the food that you eat. Having certain diseases that make it hard to absorb vitamin B12. These diseases include Crohn's disease, chronic pancreatitis, and cystic fibrosis. An autoimmune disorder in which the body does not make enough of a protein (intrinsic factor) within the stomach, resulting in not enough absorption of vitamin B12. Having a surgery in which part of the stomach or small intestine is removed. Taking certain medicines that make it hard for the body to absorb vitamin B12. These include: Heartburn medicines, such as antacids and proton pump inhibitors. Some medicines that are used to treat diabetes. What increases the risk? The following factors may make you more likely to develop a vitamin B12 deficiency: Being an older adult. Eating a vegetarian or vegan diet that does not include any foods that come from animals. Eating a poor diet while you are pregnant. Taking certain medicines. Having alcoholism. What are the signs or symptoms? In some cases, there are no symptoms of this condition. If the condition leads to anemia or nerve damage, various symptoms may occur, such as: Weakness. Tiredness (fatigue). Loss of appetite. Numbness or tingling in your hands and feet. Redness and burning of the tongue. Depression,  confusion, or memory problems. Trouble walking. If anemia is severe, symptoms can include: Shortness of breath. Dizziness. Rapid heart rate. How is this diagnosed? This condition may be diagnosed with a blood test to measure the level of vitamin B12 in your blood. You may also have other tests, including: A group of tests that measure certain characteristics of blood cells (complete blood count, CBC). A blood test to measure intrinsic factor. A procedure where a thin tube with a camera on the end is used to look into your stomach or intestines (endoscopy). Other tests may be needed to discover the cause of the deficiency. How is this treated? Treatment for this condition depends on the cause. This condition may be treated by: Changing your eating and drinking habits, such as: Eating more foods that contain vitamin B12. Drinking less alcohol or no alcohol. Getting vitamin B12 injections. Taking vitamin B12 supplements by mouth (orally). Your health care provider will tell you which dose is best for you. Follow these instructions at home: Eating and drinking  Include foods in your diet that come from animals and contain a lot of vitamin B12. These include: Meats and poultry. This includes beef, pork, chicken, turkey, and organ meats, such as liver. Seafood. This includes clams, rainbow trout, salmon, tuna, and haddock. Eggs. Dairy foods such as milk, yogurt, and cheese. Eat foods that have vitamin B12 added to them (are fortified), such as ready-to-eat breakfast cereals. Check the label on the package to see if a food is fortified. The items listed above may not be a complete list of foods and beverages you can eat and drink. Contact a dietitian for   more information. Alcohol use Do not drink alcohol if: Your health care provider tells you not to drink. You are pregnant, may be pregnant, or are planning to become pregnant. If you drink alcohol: Limit how much you have to: 0-1 drink a  day for women. 0-2 drinks a day for men. Know how much alcohol is in your drink. In the [place] one drink equals one 12 oz bottle of beer (355 mL), one 5 oz glass of wine (148 mL), or one 1 oz glass of hard liquor (44 mL). General instructions Get vitamin B12 injections if told to by your health care provider. Take supplements only as told by your health care provider. Follow the directions carefully. Keep all follow-up visits. This is important. Contact a health care provider if: Your symptoms come back. Your symptoms get worse or do not improve with treatment. Get help right away: You develop shortness of breath. You have a rapid heart rate. You have chest pain. You become dizzy or you faint. These symptoms may be an emergency. Get help right away. Call 911. Do not wait to see if the symptoms will go away. Do not drive yourself to the hospital. Summary Vitamin B12 deficiency occurs when the body does not have enough of this important vitamin. Common causes include not eating enough foods that contain vitamin B12, not being able to absorb vitamin B12 from the food that you eat, having a surgery in which part of the stomach or small intestine is removed, or taking certain medicines. Eat foods that have vitamin B12 in them. Treatment may include making a change in the way you eat and drink, getting vitamin B12 injections, or taking vitamin B12 supplements. This information is not intended to replace advice given to you by your health care provider. Make sure you discuss any questions you have with your health care provider. Document Revised: 05/23/2021 Document Reviewed: 05/23/2021 Elsevier Patient Education  2023 [employer]  

## 2022-08-06 NOTE — Progress Notes (Signed)
Addendum to feedback note:   Post Service Work and report completion occurred on 08/02/2022, 11:00 am - 12:00 pm & 1:45 pm - 4:45 pm (240 minutes)   Sent to the front desk to be shared with the [facility] on 08/06/2022     Zara Chess, PhD

## 2022-08-06 NOTE — Progress Notes (Signed)
____________________________________________________________________________________   CONFIDENTIAL PSYCHOLOGICAL ASSESSMENT1The assessment results are confidential.  This report is not to be copied in whole, or in part, nor discussed without the consent of the parent/guardian or the individual (if 18 years or older).  As children grow and mature, after several years some of the assessment results may become less valid, at which time they are best regarded as useful background information.  Name: Meredith Byrd     MRN: [id number]  Date of Birth: 20-Jan-1981      Age at Assessment Visit: 41 years  Dates of Evaluation: 06/16/2022, 07/01/2022, 07/28/2022 Date of Report:  07/31/2022 Psychologist:  Zara Chess, PhD     Psychology License # [id number], Health Services Provider Certification: HSP-P   Reason for Evaluation Meredith Byrd was seen for an evaluation at [facility] due to concerns about her social skills, restricted and repetitive behaviors, and anxiety. In general, Meredith Byrd reported that she has felt "off forever" and that she is "not like everyone else". She began questioning whether she may have something like autism spectrum disorder after learning more about ASD.  Relevant Background Information The following background information was obtained from interviews with Meredith Byrd, a review of some available/provided records, and written information from Meredith Byrd's therapist Meredith Byrd, Meredith Byrd. [facility]). The accuracy of the background information is contingent upon the reliability of the responses provided as well as the validity of the information contained in previous records.  Pregnancy and Birth Information Medication during pregnancy: Unknown                                          Exposure to substances or potentially harmful events in utero: None reported. Complications during pregnancy/delivery: Pregnancy was complicated by Grave's disease. Length of pregnancy:  Full-term                                                                       Delivery method: Vaginal         Birth weight: Unknown Complications post-delivery:  No complications immediately after birth were reported by Meredith Byrd, but she indicated that 3 weeks after birth she became dehydrated and was admitted to the hospital for a week.    Developmental Milestones Age of first developmental/behavioral concern: Meredith Byrd indicated that she has shown some notable behaviors starting in early childhood. For example, Meredith Byrd has been told that for the first 2 years of her life she would not walk on grass. She had an extended interest in playing with dolls (i.e., she played with dolls until she was 41 years old). This interest impacted her relationships, as starting around age 41 Meredith Byrd began losing friends because peers her age were no longer interested in playing with dolls. 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 In addition, when she was in school, she would "study and study" but did not seem able to retain information.  Socially, although she spent time with peers as a child, looking back she realized that she has never independently made a friend. Rather, many of her friendships were "forced" due to shared activities (e.g., church) or facilitated by her parents. Meredith Byrd has also  always had difficulty speaking to groups and tends to be more successful with one-on-one conversations. For example, during a one-on-one conversation Meredith Byrd knows that when there is a pause it is her turn to talk, but when engaged in conversation with more than 2 people she becomes "lost and confused" and often will "just sit there" because she does not know when it is her turn to talk.     Current/Past Speech/Language concerns: As noted above, Meredith Byrd reported that she has difficulty talking with groups of people and struggles with trying to translate her thoughts into words. As part of her  associate degree, for example, she took a public speaking course that went "horribly" because of her anxiety related to public speaking.                Age of first words: Within Normal Limits (WNL) Age of first 2-3-word phrases: WNL    Age of full sentences: WNL Age of walking without assistance: WNL Any loss of previous attained skills: No             Medical History: Medical or psychiatric concerns or diagnoses: AD/HD, anxiety, depression, migraines, and IBS.    Significant accidents, hospitalizations, surgeries, or infections: As noted above, when Meredith Byrd was 77 weeks old she was hospitalized for a week for dehydration. She has also had several surgeries including a breast reduction, gallbladder removal, and gastric bypass.                                  Allergies: Meredith Byrd is allergic to pollen, dander, and dust mites. She receives allergy shots to address these allergies.                                                                                                                                   Currently taking any medication: Montelukast, levocetirizine, Flonase, and clonidine.   Current/past eating/feeding concerns: Meredith Byrd reported that she has always been a picky eater. However, because she was a "people pleaser" and grew up being told that she had to eat everything that was on her plate she will eat foods that she does not really like. Within the last 3 years, she has been more aware of preferences and has been focusing on eating what is "safe for me".                                                                                   Current/past sleeping concerns: Meredith Byrd reported that she has had insomnia "forever". As a child she  was reportedly administered mild shocks as part of a treatment for bedwetting, which she indicated has had a long-term impact on her sleep. She has tried various sleep medications over the years with limited success.  Currently, on a "good night"  she gets 3 to 4 hours of sleep.                                                                                                 Hygiene concerns/changes: Meredith Byrd has problems with transitions which can cause challenges with the completion of hygiene activities. For example, she must play music and prepare herself before getting into the shower. Sundai also does not like the feeling of the toothbrush, does not like washing her face, and struggles to wash her hair.                                    Psychiatric History Current/past exposure to traumas and/or significant stressors (e.g., abuse, witness to violence, fires, significant car accidents):  Clarie reported a history of exposure to trauma.   Current/past aggressive behavior: No; rather Risa reported that she is shy and introverted. Current/past significant behavioral concerns (e.g., stealing, fire setting): No            Current/past hearing/seeing things not there or expressing unusual beliefs/ideas: No                                                                            Current/past mood concerns (depressed or unusually elevated moods): Shaynah reported that she began experiencing symptoms of depression in childhood but was not diagnosed with depression until about 2 years ago. Her current symptoms of depression are "manageable." Philamena indicated that her symptoms of depression improved as her children became older and she was able to address concerns from her childhood in the context of therapy. Maitlyn does not experience elevated moods.                             Current/past anxiety concerns (separation, social, general): Briell reported that she experiences anxiety regularly. Although anxiety is not "constant", she has more days where she experiences anxiety than days that she does not. Currently, worries include driving, the future, things going on in the world, and concerns about her son that recently moved  out. She described that she has experienced anxiety on and off for at least 6 months. When anxious, Megin can feel easily fatigued, has increased difficulty concentrating, is more irritable, and has difficulty sleeping. Once she becomes worried about something it is difficult for her to control her anxiety.  Gracia also indicated that she has a number of social anxieties. For example, she does not like  being in crowds and indicated that much of her "angst" is related to having to be out in public. She also has difficulty talking in front of groups and worries about "saying the wrong thing". Julieanna described that she tends to avoid social situations "at all costs". If an activity is unavoidable, she tries to "push through it" despite her high level of anxiety. She indicated that her level of anxiety seems excessive for the situation. Currently, however, social anxiety does not have an impact on Chloeann's daily living because she works from home and therefore only occasionally encounters anxiety provoking work situations (e.g., she has an on Camera operator once every 2 weeks or so). She also limits her engagement in other social activities. Notably, however, Megin reported that there were not necessarily things she would do if she had less social anxiety because she generally prefers to be alone.   Current/past obsessions (bothersome recurrent and persistent thoughts) or compulsions: No; However, Briyah reported that she picks at her skin and cuticles and bites her nails and lips.    Concerns regarding attention/focus/impulsivity: Amelia received an AD/HD diagnosis last year, which helped her to better understand why certain tasks are challenging for her. For example, she has become more aware of why she tends to forget things or miss appointments (which is something that she struggled with throughout her life). She is not currently taking medications targeting symptoms of ADHD.       Current/past social concerns and/or restricted or repetitive behaviors: Titania reported that she has challenges with conversations and building friendships. She wants friends but is unsure of how to maintain these types of relationships. In addition, after completing research about friendships she began questioning if she has ever truly made a friend independently. For example, even when she was in college her mother approached other college students to introduce Karesha and asked them to befriend Everest. Currently, Netra has a friend, but she has noticed that this individual usually only reaches out when she needs something. When Clarisse has reached out to her friend, she has not always received a positive response. In social settings, she can listen and pay attention but will not jump in and participate. Regarding restricted and repetitive behaviors, Jackqulyn reported that she has had sensory issues and difficulties with changes or transitions. In addition, she has observed that for her everything is either "right or wrong" with "nothing in between." She can be very literal and prefers routines that she wants to stick to.    After learning more about ASD, Nazanin began questioning if she may have something like this. She has felt "different" for her entire life, and recalls being aware of these differences as far back as elementary school. For example, in childhood she had difficulty maintaining friendships, was bullied, never "fit in", and has been unable to find her "tribe".    Current/past suicidal/homicidal ideation: No                Current/past substance use/abuse: No           Current/past legal involvement or issues: No              Past Interventions Current/past services/interventions: Celesta has been working with a therapist since 2020.  Outpatient Providers: Meredith Byrd, MAEd. [place] [facility] History of Psych Hospitalization: No  Work, Youth worker, and Assessment History          Current school attendance: Zeniya is not currently in school. She has an associate degree in medical office administration.   Attended public or private schools: public       Ever repeated a grade: No Records of prior testing: No      Academic Concerns: [employer]dom[employer] Lamy was a struggle for Jiovanna. She "hated" reading aloud in class because she was always nervous, struggled with large words, and was picked on by her peers.  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    Family and Social History                                                                                               Language(s) spoken in the home/primary language: English  With whom does the individual reside: Chayla lives with her husband and 3 children                                                                        Medical/psychiatric concerns in immediate and/or extended family history: AD/HD, depression, cancer, high blood pressure, diabetes, heart disease, hypertension, and neurodivergence     Consultations and/or information from outside sources: Ky's therapist provided a brief letter regarding her work with Debbora Dus. According to the letter, they have worked together for two years. Her therapist has observed that Brigantine shows deficits in social reciprocity and nonverbal communication, as well as some repetitive behaviors, insistence on sameness, and inflexible adherence to routines. Her therapist noted that she supported Devora in seeking further evaluation for the autism spectrum.  Tamia also completed several autism spectrum screenings online (including the RAADS-R) and shared the results with the examiner. Results of these questionnaires all indicated that there is evidence or  increased likelihood that Toriana is on the autism spectrum.   Relationship status: married Parental status: Bellamie has 4 children. Support systems: "don't really have one."  Financial stress: No  Stressors in last 6 months: No   Recreation/Hobbies: Chardonay love to read mysteries and color. She likes watching movies, though she will watch the same movie repeatedly.  Strengths: Jo reported that she loves her children and want to see them succeed. She is good at her job; although she disliked math as a child, she is "fine" with the math that she uses for her job.  Assessment Procedures:  Interviews with Latice Review of Provided Records Stanford Binet Intelligence Scale - Fifth Edition Autism Diagnostic Observation Schedule - Second Edition (ADOS-2), Module 4 Social Responsiveness Scale - 2 Generalized Anxiety Disorder score (GAD-7) Patient Health Questionnaire (PHQ-9) The DSM-5-TR Level 1 Cross-Cutting Symptom Measure and follow up measures.  Behavioral Observations:   Cassi presented for the in person evaluation visit. She seemed slightly nervous at the outset of testing and was observed to engage in some repetitive body movements such as swaying her torso from side-to-side. She also repeatedly ran her hands along her hair. Her effort seemed generally appropriate during cognitive testing. However, there were also times when she appeared confused or unsure about tasks. She also sometimes asked for items to be repeated or needed clarification about whether items were timed. Socially, Dana's eye contact was noted to be limited during cognitive testing, though she did offer information about herself. She seemed appropriately engaged in social communication testing.  Overall, it is believed that the below results are a generally valid estimate of Endia's current functioning. However, due to the above noted concerns (e.g., slight nervousness), it is also possible  that scores are an underestimate of Laurynn's abilities. As such, overall results can be interpreted with a slight degree of caution.  Assessment Results and Interpretation: Stanford Binet Intelligence Scale - Fifth Edition The Stanford-Binet Intelligence Scale - Fifth Edition is an individually administered assessment of intelligence and cognitive ability. It includes measures of both verbal and nonverbal ability. The Full-Scale IQ is derived from 10 subtests and is generally considered a standard measure of global intellectual functioning. IQ scores are standard scores, which have an average in the population of 100 and a standard deviation of 15. This means that 68% of the population has scores that fall between 85 and 115. Subtest scores have a mean of 10 and a standard deviation of 3. A percentile rank (PR) is provided for scores to show Anja's standing relative to other same-age peers. The scores obtained on the Stanford-Binet reflect Maisee's true abilities combined with some degree of measurement error. Her true score is more accurately represented by a confidence interval (CI), which is a range of scores within which her true score is likely to fall. It is common for individuals to exhibit score differences across areas of performance. Scores can also be influenced by motivation, attention, interests, and opportunities for learning. All scores may be slightly higher or lower if Marayah were tested again on a different day. It is therefore important to view these test scores as a snapshot of Nyrie's current level of intellectual functioning.   Lourene's Full Scale IQ was low average (FSIQ = 88, CI = 84-92, 21%).  The Verbal IQ score includes subtests that assess verbal reasoning, ability to detect verbal absurdities, vocabulary, verbal quantitative reasoning, verbal visual spatial processing, and verbal working memory. The Nonverbal IQ includes subtests such as matrices, procedural  knowledge, nonverbal quantitative reasoning, nonverbal visual-spatial reasoning, and nonverbal working memory. Given some variability in scores included in the Nonverbal IQ domain, this score can be interpreted with a bit of caution. Lizett's Verbal IQ was average while her Nonverbal IQ was within the low average range.   The Stanford-Binet also provides information about an individual's abilities in five cognitive domains: Fluid Reasoning, Knowledge, Quantitative Reasoning, Visual-Spatial Processing, and Working Memory. The Fluid Reasoning scale measures an individual's ability to solve verbal and nonverbal problems using inductive or deductive reasoning. Subtests include skills such as classifying objects into groups and detecting and explaining absurdities from pictures or information. The Knowledge factor assesses an individual's fund of information (acquired from home or school). These subtests include measures of an individual's vocabulary and procedural knowledge. The Quantitative Reasoning domain assesses an individual's verbal and nonverbal ability to apply mathematical problem solving. The Visual-Spatial  Processing domain measures an individual's ability to see patters and relationships. Subscales on this domain may include recreating a two-dimensional visual pattern with movable pieces. The Working Memory domain assesses an individual's ability to temporarily store and transform or sort information in memory.  Domain scores generally ranged from low average to average.   Standard Score (Confidence Interval) Percentile Descriptive Range  Full Scale IQ 88 (84-92) 21 Low Average  Nonverbal IQ 87 (82-94) 19 Low Average  Verbal IQ 90 (84-96) 25 Average  Fluid Reasoning 85 (79-95) 16 Low Average  Knowledge 89 (82-98) 23 Low Average  Quantitative Reasoning 105 (97-113) 63 Average  Visual-Spatial Processing 82 (75-91) 12 Low Average  Working Memory 86 (79-95) 18 Low Average                                               Nonverbal Subtests Verbal Subtests  Fluid Reasoning 7 8  Knowledge 8 8  Quantitative Reasoning 12 10  Visual-Spatial Processing 7 7  Working Memory 6 9   Autism Diagnostic Observation Schedule, Second Edition (ADOS-2) The ADOS-2 is a direct observation assessment for autism spectrum disorder. There are five modules in the schedule and the appropriate module is chosen according to the age and expressive, functional language level of the individual being tested. Each module consists of standardized presses designed to promote interaction and provide opportunities for the individual to exhibit typical social behavior. An algorithm is used to sum specific item codes. On the Module 4, which requires fluent speech and is designed for older adolescents/adults, the original algorithm includes a threshold for the Communication and Social Interaction domains, as well as for the combined Communication and Social Interaction sections that must be met for a classification of autism spectrum disorder. However, according to a subsequently published article, an updated algorithm was created for the Module 4 of the ADOS. Specifically, per the article: "Clinically, the Module 4 revisions yield scores that provide a more accurate summary of ASD symptoms, with an algorithm that is more closely aligned with DSM-5 criteria than the original algorithm. It also affords good sensitivity and improved specificity compared to the original Module 4 algorithm. Although it is always recommended that the ADOS be used as one source of information in a diagnostic battery, good specificity is particularly important in the assessment of adults, for whom parents are not always available to provide the comprehensive developmental history that is often helpful in making differential diagnoses." ([place], C. (2014). The Autism Diagnostic Observations Schedule, Module 4: Revised Algorithm and Standardized Severity  Scores. Journal of Autism and Developmental Disorders; 44(8), [phone].). As such, in addition to be being scored on the original algorithm, scores on the revised algorithm were also obtained and are discussed below.   During the current evaluation on the ADOS-2, Harmonii demonstrated weaknesses and inconsistencies in her social communication skills with several restricted and repetitive behaviors. Zoha's overall performance during the ADOS-2 was consistent with an autism spectrum disorder diagnosis, as her scores met ADOS-2 criteria for classification of autism/autism spectrum on both the original and revised algorithms. Please see the Diagnostic Criteria for Autism Spectrum Disorder section of this report for more information regarding Ameliya's behaviors during the ADOS-2.   Social Responsiveness Scale, Second Edition (SRS-2) The SRS-2 is a measure that identifies social impairment associated with autism spectrum disorders, quantifies its severity, and  differentiates it from that which occurs in other disorders. In additional to an overall score, the SRS-2 includes 5 treatment subscales (social awareness, social cognition, social communication, social motivation, and restricted interests and repetitive behaviors) and two subscales which are aligned with the DSM-5 criteria for autism spectrum (Social Communication and Interaction, and Restricted Interests and Repetitive Behavior). The SRS-2 was completed by Shaundrea's and one of her adult children.    On the self-report SRS-2, Sharada's overall score was in the severe range. A score in this range indicates deficiencies in reciprocal social behavior and lead to severe interference with everyday social interactions. Such scores are strongly associated with clinical diagnosis of an autism spectrum disorder. All of the subtests were also significantly elevated.   On the SRS-2 completed by Mackenna's family member, her overall score was in the  moderate range.  A score in this range indicates deficiencies in reciprocal social behavior and lead to substantial interference with everyday social interactions. Such scores are typical for individuals with autism spectrum disorders. Subtest scores were mildly to moderately elevated.   Generalized Anxiety Disorder score (GAD-7) The GAD-7 is a self-report questionnaire used to assess symptoms associated with stress and anxiety. Scores range from no/minimal symptoms of anxiety to severe symptoms of anxiety. Scores on the profile completed by Apurva fell in the mild range indicating that Meshia was experiencing a mild level of anxiety.  Patient Health Questionnaire (PHQ-9) The PHQ-9 is a self-report questionnaire used for screening, diagnosing, and monitoring for symptoms of depression. Scores range from minimal to no symptoms of depression to severe symptoms of depression. Scores on the profile completed by Fatime fell in the mild range indicating that Ashtyn was experiencing a mild level of depressive symptoms.  The DSM-5-TR Level 1 Cross-Cutting Symptom Measure The DSM-5-TR Level 1 Cross-Cutting Symptom Measure is a measure that assesses mental health domains that are important across psychiatric diagnoses. The adult version of the measure consists of 23 questions that provides brief screening across 13 psychiatric domains (depression, anger, mania, anxiety, somatic symptoms, suicidal ideation, psychosis, sleep problems, memory, repetitive thoughts and behaviors, dissociation, personality functioning, and substance use). Each item asks individuals to rate how much (or how often) they have been bothered by the specific symptom during the past 2 weeks. Each item on the measure is rated on a 5-point scale (0=none or not at all; 1=slight or rare, less than a day or two; 2=mild or several days; 3=moderate or more than half the days; and 4=severe or nearly every day), and domain where at least one  item is rated at least mild (i.e., 2) can suggest areas of concern. For substance use, suicidal ideation, and psychosis, a rating of slight (i.e., 1) suggests that additional inquiry and follow-up may be beneficial. On this measure, Anthony's responses warranted follow up in the following areas: anger, mania, anxiety, somatic symptoms, sleep, memory, and personality functioning.    DSM-5 Anger Adult The DSM-5-TR Level 2--Anger--Adult measure is a 5-item version of the PROMIS Anger Short Form that assesses the pure domain of anger in adults. Scores are converted to T-scores which fall into categories ranging from "none to slight" to "severe". On this measure, the Cataleah's overall score fell in the "none to slight" category.   DSM-5 Mania Adult Measure The Mania measure is the [facility] Self-Rating Mania Scale. It is a 5-item self-rating scale designed to assess the presence and/or severity of manic symptoms. Each item is rated on a 5-point scale with higher overall scores indicating  greater severity of manic symptoms. A score of 6 or higher indicates increased probability of manic or hypomanic symptoms and may indicated the need for further assessment, monitoring, and/or treatment. Aviance's scores suggest that she is experiencing possibly elevated symptoms and ongoing monitoring is therefore recommended.  DSM-5 Anxiety Adult Measure The Anxiety Measure assess anxiety in individuals 78 or older.  Each item is rated on a 5-point scale with higher overall scores indicating a greater severity of anxiety. Alonni's scores suggest that she is experiencing minimal elevations in anxiety.  DSM-5 Somatic Symptoms The Somatic Symptoms questionnaire is an adaptation of the 15 item Patient Health Questionnaire Physical Symptoms that assess somatic symptoms. Each item is on a 3 point scale, with total scores indicating severity of somatic symptoms. Robbin's scores suggest that she is experiencing a low level  of somatic symptoms.  DSM-5 Sleep Disturbance Adult Measure The Sleep Disturbance Short Form assess sleep disturbance in individuals 18 years and older. Each item is rated on a 5-point scale with higher overall scores indicating a greater severity of sleep disturbance. Bobby's scores suggest that she is experiencing a moderate level of sleep disturbance.   Diagnostic Criteria for Autism Spectrum Disorder from the DSM-5  The Diagnostic and Statistical Manual of Mental Disorders, Fifth Edition (DSM-5) is the handbook currently used by health care professionals in the [place] and much of the world as a guide to diagnosis. The DSM-5 contains descriptions, symptoms, and other criteria for diagnosis.  Below are some of the primary DSM-5 criteria for Autism Spectrum Disorder (ASD). Presence of sufficient evidence is determined according to clinical judgment, which is informed by interviews and assessment with the individual, his or her family, and (when necessary), other people who are familiar with Odena's behavior.   This section addresses persistent deficits in social communication and social interaction across multiple contexts and is manifested by sub criteria A1 through A3, all of which must be met either currently or by history for a diagnosis to be provided. For each of the below criteria (A1-A3) the Evidence column indicates whether there is evidence that the criteria are met. Possible responses include No, Minimal, Some, and Yes. The overall A criteria is considered met if areas A1-A3 are all marked Yes. When the A criteria is met, the recommended level of support is indicated on a scale of 1-3: Level 1 (Requiring Support), Level 2 (Requiring Substantial Support), or Level 3 (Requiring Very Substantial Support).    Evidence?   A1: Challenges with social-emotional reciprocity? Yes  From Interviews with Kaydance Bowie reported that she has had a number of social skill challenges,  including several challenges with conversations. For instance, she may talk over people during conversations because she does not know when she is supposed to talk. She also has difficulty with small talk and finds this type of conversation "stupid". She enjoys "meaningful" conversations but has never been interested in Copy. For example, in a previous job she had to interact with people to complete check-in and was often confused about why people continued to try to talk to her after the check-in process had been completed.    Hollynn also reported that she has difficulty with some social conventions and niceties, as well as difficulty with social judgment and recognizing subtle social cues. For example, she is unlikely to recognize that someone is uncomfortable with a topic of conversation unless they explicitly tell her that they do not want to talk about the subject or ask to change  the subject.    Tiernan described that she has always been a straightforward person and has some difficulties with humor. Her tendency to be straightforward and not "beat around the bush" has resulted in some conflict with her children. Saavi also can have difficulty recognizing sarcasm and tends to be "very literal". For example, she has difficulty when others use "different words" "instead of just saying what they mean".   Observations from the ADOS-2 During the ADOS-2, although Dejane was responsive to conversational bids her responses could be a bit limited. She regularly offered information about herself but rarely asked the examiner about her thoughts or experiences even when it would have been expected and/or appropriate. During the ADOS-2, Neylan also indicated that she has had difficulty with some of her previous jobs because she had to deal with the public. For example, she has difficulty with small talk and although she has no difficulty sitting in silence with another person, she is aware that  others expect that she will fill the silence.    Wallace inconsistently shared enjoyment with the examiner and had some difficulty communicating about her own emotional experiences.  For example, when asked to describe feeling happy Peter indicated that she could not describe the feeling but did act out what her body does when she is excited.    Though her social responses were generally appropriate, her social overtures could be a bit reduced. The overall interaction was sometimes comfortable but not sustained.    Positively, Orlando did comment on the emotions of others, such as characters in stories.    A2: Challenges with nonverbal communicative behaviors used for social interaction? Yes  From Interviews with Ariyel Jeangilles reported that she has always had some difficulties with eye contact and has never been able to maintain eye contact with others for very long. For example, Lillias has had significant difficulty during interviews because although she is aware that she is supposed to look at the interviewer, she tends to look elsewhere.     Nao reported that others seem to have difficulty interpreting her facial expressions as she gets asked "50 times a day" if she is okay because her resting face apparently seems angry to others. Others may also become upset with her expressions because she does not have good control of the faces she makes. She can recognize other's obvious emotional expressions but has difficulty recognizing some emotions (e.g., confusion or surprise). Sofhia also feels "totally confused" when someone's tone does not match what they are saying. Katey has difficulty with other forms of nonverbal communication as well.   Observations from the ADOS-2 During the ADOS-2, Ilo inconsistently used eye contact to begin, regulate, and terminate social interactions. Although Rodneshia occasionally checked in with the examiner using eye contact or briefly  maintained eye contact with the examiner, in general Earnie's eye contact was reduced and there were a number of occasions when her eye contact was absent when expected.    Although Nuala seemed to make a range of facial expressions she inconsistently directed those to the examiner for communicative purposes.    Positively, she used a variety of gestures including descriptive and emphatic gestures.    A3: Challenges with developing, maintaining, and understanding relationships? Yes  From Interviews with Aries Kasa reported that in childhood she wanted friends because she wanted someone to be nearby when she was playing, but she was not very interested in playing with peers. Specifically, Synda wanted her friends to be in the same  room playing with similar toys but preferred parallel play. For example, when playing Staves with friends, Yariana would be pretending that her Jacquelyne Balint was going to the mall and would have her friends pretend that their Margot Chimes were at home. Carol reported that this type of play made sense to her, but it seemed that "not everyone was happy with that arrangement". If one of her friends tried to join in with her play or insert themselves into what she was pretending she would turn away from them and eventually ask them to go back to their space if they did not withdraw. Hani reported playing this way for as long as she can remember. Although she did not realize it at the time, as an adult she has identified this as one possible reason why "friends started trickling off" as she aged. She further realized that she had been looking for someone to play near while other children were looking for someone to play with.    Conita has continued to have some difficulty with social interactions. For example, during the ADOS-2 Jazline reported that she continues to enjoy "parallel play" with others (e.g., she enjoys sitting near her husband while they engage in  different activities, though he seems to like this less than she does).     In childhood, although she had get-togethers with peers, she never really had a best friend. Across her childhood and young adulthood, her friendships were facilitated by her parent. For example, her mother helped her to make her first friend at college by going across the hall, introducing [place] and asking the peer to be friends with Deyana.     As noted above, she has a friend now, but Carlin has observed that her friend reaches out mostly when she needs something and when Minnie has reached out the response has not always been positive. Currently, she tends not to be invited places by her friends and in general prefers to be on her own. When she does spend time with others, she prefers that the group size is limited to 1-2 other people, because she ends up feeling lost in larger groups.    During social interactions, Tericka will try to be like a "chameleon" (e.g., she will watch and mimic what people around her are doing). She also prepares for outings ahead of time. For example, if she were meeting with others at a [place] will try to decide what to order before she gets there and will plan out possible conversations. She reported that because she tries to present differently to different people, she has difficulty knowing who she really is.   Observations from the [place] demonstrated reduced insight into typical social relationships.      Level of Support: Requiring Support (Level 1)    This section addresses restricted, repetitive patterns of behavior, interests, or activities. To meet this criterion, Yaffa must have characteristics in at least two of the four sub criteria either currently or by history, which address repetitive behaviors and speech, rigidity, intense interests, and sensory interests/aversions. For each of the below sub criteria (B1-B4) the Evidence column indicates  whether there is evidence that the criteria are met. Criteria are considered met in this area when at least 2 out of the 4 sub criteria (B1-B4) are marked Yes.  If the overall B criteria is met, the recommended level of support is indicated on a scale of 1-3: Level 1 (Requiring Support), Level 2 (Requiring Substantial Support), or Level 3 (Requiring  Very Substantial Support).    Evidence?   B1: Stereotyped or repetitive motor movements, use of objects, or speech? Yes  From Interviews with Laquasia When she was younger, Anesia always had to have a Barbie and/or a cabbage patch doll with her. She would also organize her dolls. When she was done playing with them, she would sometimes take them apart and then create piles of their pieces (e.g., a pile of legs, pile of arms, pile of heads, etc.).      England reported that she engages in a number of different repetitive body movements including pacing, some finger movements, swaying side-to-side, bouncing, and occasionally hand flapping when she is excited. She has always engaged in these behaviors. In childhood, she also regularly would spin (e.g., she would seek out the spinning playground equipment). During the ADOS-2 when asked about behaviors that she may engage in that might bother others, Falecia reported that she will pace and twirl, as well as watching the same movie for 3 to 6 months.   Observations from the ADOS-2 During the ADOS-2, Shardai's speech could be somewhat formal, and she used some unexpected phrases. For example, when talking about work she used the phrase "had to people" to describe difficulty she had interacting with customers.    Across the visit, Brigit showed some repetitive body movements including regularly swaying her torso from side-to-side when sitting.    B2: Insistence on sameness, inflexible adherence to routines, or ritualized patterns of verbal or nonverbal behavior? Yes  From Interviews with Silva Aamodt  has difficulty with transition and described often needing a 15-minute warm up to mentally prepare for the next activity. For example, she cannot just get into the shower but needs 15 minutes to make the transition into the shower. She is very bothered if she is interrupted before she completes an activity.    If she has an appointment in the afternoon, Deidrea cannot plan anything else during the day because she worries that she will be late, describing being late as "the bane of my existence". When going somewhere new she needs to be the first person there and learn the layout of the building (e.g., location of the restrooms).    Yuritzy also has difficulty with changes. For example, she will pick a "favorite food" that she will eat for breakfast and lunch for months at a time. For example, her current preferred food is [employer]. She recently went to the store to purchase more of this item and went into "meltdown mode" when she was told that the store was out. Before this, she ate [employer] ravioli for breakfast and lunch for 6 months. She has always had particular foods that she ate repeatedly. For example, when she was younger her preferred foods were mashed potatoes and corn, and she ate these foods for almost every dinner. Her parents tried to get her to expand her food preferences but were unsuccessful. Currently, if she is making dinner she will make chicken, rice or mashed potatoes, and corn as the vegetable.    Brandelyn also reported that when she is eating the bites need to be uniform throughout. For example, if there are 3 foods on her plate, she will put a little bit of each food on her fork before eating it. Once one of the foods has been entirely eaten her "meal is over", even if she still has the other 2 foods on her plate. Emile indicated that this is one of the  reasons why she does not eat in public very often (i.e., others notice how she is eating so she  cannot eat the way she wants to).    Remmy also reported that she dislikes spontaneity and being asked to do something at the last minute. She needs to know ahead of time about activities, so if it is "not in the plan, it is a no".    Khalila reported that she shows cognitive inflexibility, can be very rigid in her thinking, and does not like when there is room for interpretation. She wants everything to make sense and be logical. At work she wants to "follow the standard operating procedures" and struggles with any grey areas. If she is not provided with step-by-step plans, she will request a "step action chart".    Observations from the ADOS-2 During the ADOS-2, Revonda reported that one of the things that she likes about work is that she receives clear directives. She also described herself as an "all or nothing person".    B3: Highly restricted, fixated interests that are abnormal in intensity or focus? Yes  From Interviews with Sarajane As noted above, Brieana has a history of being very interested in [place].  She played with Barbies every day after school and for much of the summer. She continued to play with Barbies until she was 55, much longer than other children her age. She indicated that by the age 47, she was often alone playing with Barbies and identified this extended interest in [place] as one of the factors that may have resulted in friends no longer spending time with her.     She also described reading as a long-term passion for her.    About 3 times a month something will capture Pauleen's attention, and she may end up doing extensive research about this topic for 2 to 3 days.    B4: Hyper- or hyporeactivity to sensory input or unusual interest in sensory aspects of the environment? Yes  From Interviews with Sandrine Bloodsworth reported a number of sensory differences that have been present across her life span. For example, Alys currently will pull on and feel her  hair and rub her hair between her nose and mouth. When she was younger, Tiena did not have long hair so she would cover her head with sheets or towels so she could fidget with, pull on, or feel these objects on her head.    Hamdi reported that bright lights are very bothersome to her and described the lights at [facility] as "horrible". She will seek out soft textures like velvet. She is bothered by the feeling of her hands being wet and people touching her when their hands are wet. She reported that she generally dislikes people touching her; for example, although she tolerates her children touching her it is not her preference. She also described that wearing masks was "the worst" because she dislikes things touching her face.    Kaleb described that she is bothered by smells. For example, at home she changes the cat litter twice a day due to her aversion to smells.    In terms of sounds, Cohen loves music, but she listens to it mostly for the percussion in the background. She is bothered by a number of sounds including the sound of nails tapping together and chalk on a chalkboard. She recalled getting into trouble in school because she had difficulty staying in the classroom while her teacher was writing on the board.  Levonne is bothered by foods that are slimy and unexpected textures in her foods (e.g., her husband made a new type of lasagna that had crumbled sausage in it, and when she encountered the unexpected texture of sausage she could not continue eating). She also dislikes foods that do not have the same consistency throughout (for example, foods that are hard on the outside but soft on the inside).    She is also very sensitive to temperature and indicated that she always feels hot.  She is very particular about the temperature of her home and notices as soon as there is an adjustment. Specifically, Caeleigh's most preferred temperature is 68 degrees, but her spouse seems to want  to keep the house warmer.   Observations from the ADOS-2 During the Pringle showed interest in the sensory aspects of materials and herself. For example, she commented on the smell of some foam puzzle pieces.  She also regularly felt the texture of her hair.    During the ADOS-2, when talking about behaviors that annoyed her, Rainee listed several sensory behaviors including the sound of people chewing, swallowing, and drinking.  She also described that there is a particular eye mask that she wears "97% of the time at home" because she likes the pressure that it provides on her head.  When talking about teeth brushing, she indicated that she does not brush her teeth every day because the feeling of the bristles bothers her, so if her teeth or gums feel sensitive, she will skip brushing her teeth.    Level of Support: Requiring Support - Requiring Substantial Support (Level 1-2)    Taken together, Lyly meets DSM-5 criteria for autism spectrum disorder in the areas of social communication and interaction skills, as well as in restricted and repetitive interests and behaviors. Further, symptoms are causing clinically significant impairment (DSM-5 criteria D) and are not better explained by another disorder (DSM-5 criteria E). Finally, although Jilene's presentation is a bit complicated due to the lack of someone who knew her in early childhood that could report on her behavior, Jordyn was able to provide a numerous example of behaviors suggestive of an autism spectrum disorder that were present in childhood (DSM-5 criteria C).     Summary and Diagnostic Impressions:  Alsace was seen for an evaluation at [facility] due to concerns about social skills, restricted and repetitive behaviors, and anxiety. In general, Neelie reported that she has felt "off forever" and "not like everyone else".  She began questioning whether she may have something like ASD after  completing some research about autism spectrum. Relevant background history includes that when Reeya was 79 weeks old she was hospitalized for a week for dehydration. Previous diagnoses have included AD/HD, anxiety, and depression. Jourdin also reported a history of exposure to trauma.  During the current evaluation, Lupie completed cognitive testing. Specifically, the Stanford-Binet 5 was used to assess Natalyia's cognitive functioning in a range of domains. It is important to note that cognitive scores can be influenced by a variety of factors. Therefore, scores are best interpreted as a snapshots of the individual's current cognitive functioning. Overall, Verleen's scores were mostly within the low average range, with domain scores ranging from low average to average.  When considering an autism spectrum diagnosis, several areas are taken into account, including an individual's social communication behavior, the presence of restricted/repetitive behaviors, reported developmental history, and current developmental functioning. As part of the current evaluation the ADOS-2 was completed with Manahil.  During the ADOS-2, Hera demonstrated weaknesses and inconsistencies in her social communication skills with several restricted and repetitive behaviors. Liadan's overall performance during the ADOS-2 was consistent with an autism spectrum disorder diagnosis, as her scores met ADOS-2 criteria for classification of autism spectrum/autism on both the original and revised algorithms. Self-reported developmental history and current behaviors were also consistent with a diagnosis of autism spectrum disorder, including a history of and current weaknesses in social communication skills, as well as several of restricted and repetitive behaviors. In addition, Wadie's score on the self-report SRS-2 was in the range which is strongly associated with a diagnosis of autism spectrum disorder. On the SRS-2  completed by Evie's family member, the score was in the range considered typical for someone with an autism spectrum disorder. Finally, according to written information from her therapist, Munachimso shows several behaviors suggestive of an autism spectrum disorder diagnosis including difficulties with social reciprocity and nonverbal communication skills, inflexible adherence to routines, and repetitive behaviors. Therefore, based on the integration of the results from the current diagnostic assessment, self-reported developmental history, and behavioral observations, Thelda meets diagnostic criteria for Autism Spectrum Disorder (F84.0). More specifically, Leonila meets criteria for the diagnosis of autism spectrum disorder, requiring support (level 1) in the area of social communication and interaction, and requiring support to requiring substantial support (level 1-2) in the area of restricted and repetitive behaviors and interests, without evidence of intellectual or language impairment. Please note, these specifiers and levels of support refer only to the impact of Eyva's autism spectrum disorder diagnosis, and do not consider any difficulties that Tara may experience from her other diagnoses. In addition, please note that these levels of support and specifiers are based on currently observed skills and behaviors and should be reevaluated as Barbi ages to ensure they appropriately describe her challenges and the support they require.   In addition to social communication challenges, Vinette reportedly has a prior diagnosis of AD/HD as well as some difficulties with mood and anxiety. Specifically, Sinia reported that she began experiencing symptoms of depression in childhood and was diagnosed with depression two years ago.  She seems to have benefited from therapy and described her current level of symptoms as "manageable". Consistent with this report, scores on the PHQ-9 suggested  that Ciin is experiencing a mild level of depression at this time. Results of the current evaluation, however, raised slight concerns about the possibility of mood variability, and as such her overall mood should continue to be monitored. Regarding anxiety, Sandrine worries about a number of different events or activities, experiences anxiety regularly, and experiences a number of consequences of worry including difficulty sleeping and irritability.  Although scores on the GAD-7 and DSM-5 anxiety measures fell in the minimal to mild range, given results of the evaluation, her self-reported behaviors, and observations during the evaluation, at this time it appears that she meets criteria for diagnosis of generalized anxiety disorder (F41.1, with features of social anxiety). Results also suggest that she is experiencing sleep challenges. As such, Eduardo would likely benefit from working with her therapist to continue to address sleep and anxiety concerns, and to continue to address and monitor her mood.   Overall, Tina would benefit from additional supports and interventions targeting her skills in several areas.  As such, recommendations for intervention services and ongoing supports were provided.  A detailed review of recommendations is listed below.  Recommendations:  The following recommendations are based on findings from the present evaluation and include a variety  of recommendations including some from various scoring programs.  If certain services are already being obtained based on a previous recommendation, please continue with those services:  It is recommended that Fujiko's share the results of this evaluation with their primary care physician. It would also be helpful to discuss sleep challenges.  Darlina would benefit from continuing in individual mental health therapy aimed at decreasing anxiety, improving sleep, and monitoring and addressing any ongoing mood challenges.  This person can also help her to increase her social awareness, understanding of friendships and interpretation of social situations, as well as help her to increase her coping and problem-solving skills. Some potential therapeutic approaches to consider include Dialectical Behavioral Therapy (DBT) and Cognitive Behavioral Therapy (CBT).  Laiylah may also benefit from developing additional strategies for coping with stress or uncomfortable feelings.  Jahniya will also likely benefit from explicit instruction on how to problem solve and how to cope with her emotions.  CBT-I has been shown to be effective in helping to address sleep challenges.   Justina should continue working with their prescriber for medication management.   Chequita may benefit from a speech and language consultation in order to improve her conversational and pragmatic skills and increase her ability to communicate effectively with others.   Princetta may also benefit from an occupational therapy consult to address sensory concerns.    Naveena may benefit from increasing her understanding of social relationships and her insight into peer interactions. This may be accomplished through therapy targeted at improving her social skills and understanding of social relationships, and can be implemented in individual therapy, a group setting, or through work with a Copywriter, advertising.   Kamdyn's may want to consider working with an audiologist to complete an Auditory Processing evaluation.   Due to Alyia's diagnosis of autism spectrum disorder, it may be beneficial to look into state services.    If work challenges become apparent, Alda may be interested in looking into Investment banker, corporate (VR) Services. More information about Vocational Rehabilitation Services can be found at InternationalWords.com.cy  It is recommended that Latajah consider seeking support through participation in a  support group.    Adults with learning and/or developmental disabilities may benefit from learning more about work accommodations and about how to talk to employers about accommodations. More information about workplace accommodations and the Americans with Disabilities Act can be found at the [employer] (DeathPrevention.it). They can also be contacted by phone at [phone]. Their website also has a form letter for requesting workplace accommodations, and can be found at SouthExposed.es.cfm as well as a Comptroller of workplace accommodations.  Individuals with ADHD and/or autism spectrum often have difficulties with executive functioning. Executive functions include a wide range of self-regulation processes that connect, prioritize and integrate other cognitive processes. Tascha may benefit from working with a therapist to help strengthen her executive functions. A book that may also be helpful for Jayanna Smart but Scattered Guide to Success: How to Use Your Brain's Executive Skills to Keep Up, [employer], and Get Organized at Work and at Home by [place].   Janus may also benefit from working with an Insurance account manager that can help address any areas of weakness and help set them up for success. For example, [employer] (www.chadd.org) maintains a [employer] that includes a number of AD/HD coaches.   Brynn also expressed interest in potentially going back to school at some point. Should she enroll in college, it is  recommended that she seek accommodations at the post-secondary level to increase her chances of success. For example, it will be important to gather information about the resources available for students through the [place]. Some of the accommodations that should be examined include, but are not limited to: priority enrollment for small rather than large classes,  teachers who provide external structure and organization, classes in which grades are based on multiple aspects of student performance, testing in a quiet environment and use of a note taker or access to resources to help with getting notes from class.  Other supports that may help Alexiana to be successful include repetition of to be learned material and working with a tutor for any subjects that she finds particularly challenging. Making use of supports like audio text may also be helpful.   The [place] Program as a variety of services for families and individuals with autism spectrum disorder.  For more information about programs can be found at HandicappingSports.nl.   The Autism Society of [place] has information about resources in [place]. More information can be found at https://www.autismsociety-[place].org/  Thank you for the opportunity to be involved in Salah's care.  If you have further questions regarding the results of this assessment, please contact me at [phone].      Zara Chess   __________________________________, PhD Zara Chess, PhD Psychology License # [id number], Health Services Provider Certification: HSP-P    Zara Chess, PhD

## 2022-08-15 ENCOUNTER — Inpatient Hospital Stay: Payer: Commercial Managed Care - HMO | Attending: Hematology and Oncology

## 2022-08-15 VITALS — BP 113/64 | HR 60 | Temp 98.3°F

## 2022-08-15 DIAGNOSIS — E538 Deficiency of other specified B group vitamins: Secondary | ICD-10-CM | POA: Diagnosis not present

## 2022-08-15 DIAGNOSIS — Z79899 Other long term (current) drug therapy: Secondary | ICD-10-CM | POA: Diagnosis not present

## 2022-08-15 DIAGNOSIS — D508 Other iron deficiency anemias: Secondary | ICD-10-CM

## 2022-08-15 MED ORDER — CYANOCOBALAMIN 1000 MCG/ML IJ SOLN
1000.0000 ug | Freq: Once | INTRAMUSCULAR | Status: AC
  Administered 2022-08-15: 1000 ug via INTRAMUSCULAR
  Filled 2022-08-15: qty 1

## 2022-08-20 ENCOUNTER — Telehealth: Payer: Self-pay | Admitting: Clinical

## 2022-08-20 NOTE — Telephone Encounter (Signed)
Returned call to [place] and LM on voicemail to obtain more information about the referrals needed. Requested a call back,  Ronnie Derby, PhD

## 2022-08-22 ENCOUNTER — Ambulatory Visit: Payer: Commercial Managed Care - HMO

## 2022-08-26 NOTE — Telephone Encounter (Signed)
Spoke to [employer], - she needs a referral to audiology (an auditory processing evaluation was recommended based on the results of the evaluation). Explained that we will get the process going.   Ronnie Derby, PhD

## 2022-08-29 ENCOUNTER — Inpatient Hospital Stay: Payer: Commercial Managed Care - HMO

## 2022-08-29 VITALS — BP 117/68 | HR 66 | Temp 97.8°F | Resp 16

## 2022-08-29 DIAGNOSIS — E538 Deficiency of other specified B group vitamins: Secondary | ICD-10-CM | POA: Diagnosis not present

## 2022-08-29 DIAGNOSIS — D508 Other iron deficiency anemias: Secondary | ICD-10-CM

## 2022-08-29 MED ORDER — CYANOCOBALAMIN 1000 MCG/ML IJ SOLN
1000.0000 ug | Freq: Once | INTRAMUSCULAR | Status: AC
  Administered 2022-08-29: 1000 ug via INTRAMUSCULAR

## 2022-09-01 ENCOUNTER — Encounter: Payer: Self-pay | Admitting: Neurology

## 2022-09-01 ENCOUNTER — Ambulatory Visit: Payer: Commercial Managed Care - HMO | Admitting: Neurology

## 2022-09-01 VITALS — BP 126/89 | HR 78 | Ht 60.0 in | Wt 183.5 lb

## 2022-09-01 DIAGNOSIS — F840 Autistic disorder: Secondary | ICD-10-CM | POA: Insufficient documentation

## 2022-09-01 DIAGNOSIS — G43019 Migraine without aura, intractable, without status migrainosus: Secondary | ICD-10-CM

## 2022-09-01 MED ORDER — VERAPAMIL HCL ER 120 MG PO TBCR: 120.0000 mg | EXTENDED_RELEASE_TABLET | Freq: Every day | ORAL | 11 refills | Status: AC

## 2022-09-01 MED ORDER — EMGALITY 120 MG/ML ~~LOC~~ SOSY: PREFILLED_SYRINGE | SUBCUTANEOUS | 6 refills | Status: AC

## 2022-09-01 MED ORDER — UBRELVY 100 MG PO TABS: ORAL_TABLET | ORAL | 11 refills | Status: AC

## 2022-09-01 NOTE — Progress Notes (Signed)
Chief Complaint  Patient presents with   Follow-up    Rm 12. Alone. Has been unable to use Ubrelvy due to insurance cost, requesting samples. States she had 9 migraine days per month on Ubrelvy. Without Meredith Byrd she is having 15 or more migraine days per month.      ASSESSMENT AND PLAN  Meredith Byrd is a 41 y.o. female    Chronic migraine headaches Recent diagnosis of autism spectrum disorder  Emgality as preventive medication   Ubrelvy as needed  Verapamil CR 120 mg as preventive medication  Return To Clinic With NP In 6 Months   DIAGNOSTIC DATA (LABS, IMAGING, TESTING) - I reviewed patient records, labs, notes, testing and imaging myself where available.   MEDICAL HISTORY: Meredith Byrd is 42 years old right-handed African-American female, is referred by her primary care physician nurse practitioner Everardo Beals for evaluation of migraine headaches.Initial evaluation was in February 2016.   She had long-standing history of migraine, since elementary school, her typical migraine are lateralized severe pounding headache with associated light noise sensitivity, nauseous, can last for few days, even after few weeks. 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   She now complains of increased headache frequency, since 2014, she is having 3-4 typical migraine headaches each week, each episode last for a few days, in January, she has one severe episode of migraine lasting for 2 weeks. She was not able to identify triggers, she has tried triptan treatment in the past, Imitrex, cause heart racing, tightness, Maxalt, similar side effect, without helping her headache much.   Over the past 18 months, she was given Stadol nasal spray for headaches, she gets 3 bottles of  10 mg Stadol nasal spray each month, this was prescribed by her previous primary care physician [facility], but always has recurrent headaches shortly afterwards.   UPDATE March 24th 2016: She reported only temporary partial relief with previous nerve block in November 23 2014, she had tried Zomig nasal spray 10 times, without helping her headaches, Previously Preventive medications, Topamax works for her headaches, but she is trying to conceiving again, does not want to take too much medications, I have went over other possibilities including Inderal, nortriptyline, all belongs to category C, I also advised her avoiding triptan, and Stadol treatment during pregnancy,   She had 4 children, no significant headache during her previous pregnancy   UPDATE Feb 09 2017: She continue complains of frequent migraines about 10-15 days each month, sometimes preceded by visual distortion, usually short lasting, but sometimes she woke up with severe migraine headache, last visit was in March 2016, over the past 2 years, she was managed by her primary care physician, per patient, she was given prescription of Stadol, tramadol, but we were not able to find her refill information from [facility].   She states she has tried and failed multiple preventative medications, Topamax,cause too much memory issues, almost lost her job,   For abortive treatment she has tried Maxalt, Zomig, sumatriptan, none of above works for her, some of the medication even made it more intense.   UPDATE Oct 28 2017: She continue have 15-20 migraine headaches days each month, she has tried different preventive medications, Topamax, nortriptyline, Inderal, without helping her headaches,  She has suboptimal response to triptan treatment, imitrex, zomig, relpax, maxalt. taking tramadol 50 mg 2 tablets as needed for migraine,   We started CGRP antagonist Emglaity today,   UPDATE Feb 28 2018  She has started  [employer] since January 2019, she reproted decreased severity of migraine headaches from 15/10 to 9/10, she still has 17 migraine headaches days each month,   I have offered her options of adding on preventive medications, she does not want to try Effexor, has tried calcium channel blocker, Botox injection   She owns balance to headache wellness center, does not want to go to [facility] or [place] for second opinion,    UPDATE May 17 2019: She continues to have frequent headaches, 12-15 each month, lateralized severe pounding headache was associated light, noise sensitivity, nauseous, previously triptan was not helpful, in addition, she complains of has tried trazodone, nortriptyline,temazepam, Ambien not helping her sleep, she had sleep study in November 2018, there was no evidence of obstructive sleep apnea, previous Emgality was helping her migraine some, she was taking tramadol as needed, wants to try to [place] this time     UPDATE Sep 01 2022: I reviewed the [facility] behavioral evaluation by Dr. Barnetta Chapel in October 2023, she meets the diagnostic criteria for autism spectrum disorder in the family of social communication and interaction skill as well as ajovy restricted and repetitive interest in behavior, although symptoms are causing clinically significant impairment in mildly better explained by another disorder,  This suggestion is autism spectrum disorder requiring support (level 1) in the area of social communication and interaction, and requiring support to require substantial support (level 1-2) in the area of restricted and repetitive behaviors and interests, without evidence of intellectual or language impairment.  In addition to social communication challenges, she also reports a previous diagnosis of ADHD as well as some difficulties with mood and anxiety, specifically she reports she began to experience symptoms of depression in childhood, formally diagnosed with depression about 2  years ago, she seems to have benefited from therapy and describes her current level of symptoms as manageable,  Recommendation is she would benefit individual mental health therapy aimed at decreasing anxiety, improving sleep, monitoring and addressing any ongoing challenges, she can be also helped to increase her social awareness, understanding of the friendship and the interpretation of social situation, increase her coping and problem-solving skills, potential therapeutic approach will consider behavioral therapy and cognitive behavior therapy,  Laboratory evaluation in October 2023: Hemoglobin of 11.4, normal CMP with exception of slight decreased calcium 8.8, iron panel showed ferritin of 48, saturation ratio of low normal range, vitamin D level was 11.2, B12 was decreased 176,    PHYSICAL EXAM:   Vitals:   09/01/22 1507  BP: 126/89  Pulse: 78  Weight: 183 lb 8 oz (83.2 kg)  Height: 5' (1.524 m)     Body mass index is 35.84 kg/m.  PHYSICAL EXAMNIATION:  Gen: NAD, conversant, well nourised, well groomed                     Cardiovascular: Regular rate rhythm, no peripheral edema, warm, nontender. Eyes: Conjunctivae clear without exudates or hemorrhage Neck: Supple, no carotid bruits. Pulmonary: Clear to auscultation bilaterally   NEUROLOGICAL EXAM:  MENTAL STATUS: Speech/cognition: Awake, alert, oriented to history taking and casual conversation CRANIAL NERVES: CN II: Visual fields are full to confrontation. Pupils are round equal and briskly reactive to light. CN III, IV, VI: extraocular movement are normal.  No ptosis. CN V: Facial sensation is intact to light touch CN VII: Face is symmetric with normal eye closure  CN VIII: Hearing is normal to causal conversation. CN IX, X: Phonation is normal. CN XI: Head turning and shoulder shrug are intact  MOTOR: There is no pronator drift of out-stretched arms. Muscle bulk and tone are normal. Muscle strength is  normal.  REFLEXES: Reflexes are 2+ and symmetric at the biceps, triceps, knees, and ankles. Plantar responses are flexor.  SENSORY: Intact to light touch, pinprick and vibratory sensation are intact in fingers and toes.  COORDINATION: There is no trunk or limb dysmetria noted.  GAIT/STANCE: Posture is normal. Gait is steady   REVIEW OF SYSTEMS:  Full 14 system review of systems performed and notable only for as above All other review of systems were negative.   ALLERGIES: Allergies  Allergen Reactions   Doxycycline Nausea And Vomiting   Fioricet [Butalbital-Apap-Caffeine] Other (See Comments)    GI upset   Inderal [Propranolol] Nausea Only    Dropped BP too low   Nsaids Nausea Only    HOME MEDICATIONS: Current Outpatient Medications  Medication Sig Dispense Refill   acetaminophen (TYLENOL) 500 MG tablet Take 2 tablets (1,000 mg total) by mouth every 6 (six) hours as needed. 30 tablet 0   acetaminophen-codeine (TYLENOL #3) 300-30 MG tablet Take 1 tablet by mouth 2 (two) times daily as needed for moderate pain. 30 tablet 0   albuterol (VENTOLIN HFA) 108 (90 Base) MCG/ACT inhaler Inhale 1-2 puffs into the lungs every 6 (six) hours as needed for wheezing or shortness of breath. 18 g 0   cloNIDine (CATAPRES) 0.2 MG tablet Take 0.4 mg by mouth at bedtime.     Cyanocobalamin (B-12 COMPLIANCE INJECTION) 1000 MCG/ML KIT Inject as directed.     Fremanezumab-vfrm (AJOVY) 225 MG/1.5ML SOAJ Inject 225 mg into the skin every 30 (thirty) days. 1.68 mL 5   medroxyPROGESTERone (DEPO-PROVERA) 150 MG/ML injection      montelukast (SINGULAIR) 10 MG tablet Take 10 mg by mouth at bedtime.     UNABLE TO FIND Med Name: allergy injections  3 times a week     Vitamin D, Ergocalciferol, (DRISDOL) 1.25 MG (50000 UNIT) CAPS capsule TAKE 1 CAPSULE BY MOUTH EVERY 7 DAYS 12 capsule 0   Ubrogepant (UBRELVY) 100 MG TABS Take 1 tablet at the onset of migraine. Can repeat in 2 hours if needed. Only 2 tabs/24  hours (Patient not taking: Reported on 09/01/2022) 15 tablet 11   No current facility-administered medications for this visit.    PAST MEDICAL HISTORY: Past Medical History:  Diagnosis Date   Allergy    Anemia    Anxiety    Arthritis pain 08/20/2014   Back pain    COVID-19    GERD (gastroesophageal reflux disease)    History of stress test 02/2011 ([facility])   there was no evidence of ischemia, but she only went 3 1/2 minutes on the treadmill making it very difficult to get a good accurate assessment, however   Hx of echocardiogram    The echocadiogram was essentially normal with the exception of mild mitral calcification and borderline concentric LVH, which in the setting of her hypertension at this early age is something that mean her blood pressure is well controlled.    Hypertension    Iron deficiency anemia, unspecified 07/09/2014   Migraines    Palpitations    Swelling    Vitamin B12 deficiency 03/17/2016    PAST  SURGICAL HISTORY: Past Surgical History:  Procedure Laterality Date   BREAST SURGERY     CARPAL TUNNEL RELEASE Right 09/24/2021   Procedure: RIGHT CARPAL TUNNEL RELEASE;  Surgeon: Leandrew Koyanagi, MD;  Location: [facility];  Service: Orthopedics;  Laterality: Right;   CHOLECYSTECTOMY     GASTRIC BYPASS  2008   OOPHORECTOMY Right 2010   REDUCTION MAMMAPLASTY Bilateral     FAMILY HISTORY: Family History  Problem Relation Age of Onset   Asthma Mother    Heart disease Mother    Diabetes Mother    Early death Mother    Hypertension Mother    Thyroid disease Mother    Anxiety disorder Mother    Hypertension Father    Heart disease Father    Breast cancer Sister 37   Breast cancer Maternal Aunt        under 16   Breast cancer Paternal Aunt    Stroke Maternal Grandmother    Heart disease Maternal Grandmother    Hypertension Maternal Grandmother    Hyperlipidemia Maternal Grandmother    Breast cancer Maternal Grandmother     SOCIAL  HISTORY: Social History   Socioeconomic History   Marital status: Married    Spouse name: Damon   Number of children: 4   Years of education: Associates   Highest education level: Not on file  Occupational History   Occupation: Insurance Claims  Tobacco Use   Smoking status: Never   Smokeless tobacco: Never  Vaping Use   Vaping Use: Never used  Substance and Sexual Activity   Alcohol use: No   Drug use: No   Sexual activity: Yes  Other Topics Concern   Not on file  Social History Narrative   Right handed.   Lives at home with husband and four children.   No caffeine use.   Social Determinants of Health   Financial Resource Strain: Not on file  Food Insecurity: Not on file  Transportation Needs: Not on file  Physical Activity: Not on file  Stress: Not on file  Social Connections: Not on file  Intimate Partner Violence: Not on file   Total time spent reviewing the chart, obtaining history, examined patient, ordering tests, documentation, consultations and family, care coordination was  19 minutes   Marcial Pacas, M.D. Ph.D.  [facility] [address], [place], [place] [address] Ph: [phone] Fax: [phone]  CC:  Janie Morning, [place],  [place] [address]  Janie Morning, DO

## 2022-09-12 ENCOUNTER — Inpatient Hospital Stay: Payer: Commercial Managed Care - HMO | Attending: Hematology and Oncology

## 2022-09-12 VITALS — BP 112/68 | HR 79 | Temp 99.7°F | Resp 17

## 2022-09-12 DIAGNOSIS — Z79899 Other long term (current) drug therapy: Secondary | ICD-10-CM | POA: Diagnosis not present

## 2022-09-12 DIAGNOSIS — D508 Other iron deficiency anemias: Secondary | ICD-10-CM

## 2022-09-12 DIAGNOSIS — E538 Deficiency of other specified B group vitamins: Secondary | ICD-10-CM | POA: Diagnosis not present

## 2022-09-12 DIAGNOSIS — Z9884 Bariatric surgery status: Secondary | ICD-10-CM | POA: Insufficient documentation

## 2022-09-12 DIAGNOSIS — D509 Iron deficiency anemia, unspecified: Secondary | ICD-10-CM | POA: Diagnosis not present

## 2022-09-12 MED ORDER — CYANOCOBALAMIN 1000 MCG/ML IJ SOLN
1000.0000 ug | Freq: Once | INTRAMUSCULAR | Status: AC
  Administered 2022-09-12: 1000 ug via INTRAMUSCULAR
  Filled 2022-09-12: qty 1

## 2022-09-12 NOTE — Patient Instructions (Signed)
Vitamin B12 Deficiency Vitamin B12 deficiency occurs when the body does not have enough of this important vitamin. The body needs this vitamin: To make red blood cells. To make DNA. This is the genetic material inside cells. To help the nerves work properly so they can carry messages from the brain to the body. Vitamin B12 deficiency can cause health problems, such as not having enough red blood cells in the blood (anemia). This can lead to nerve damage if untreated. What are the causes? This condition may be caused by: Not eating enough foods that contain vitamin B12. Not having enough stomach acid and digestive fluids to properly absorb vitamin B12 from the food that you eat. Having certain diseases that make it hard to absorb vitamin B12. These diseases include Crohn's disease, chronic pancreatitis, and cystic fibrosis. An autoimmune disorder in which the body does not make enough of a protein (intrinsic factor) within the stomach, resulting in not enough absorption of vitamin B12. Having a surgery in which part of the stomach or small intestine is removed. Taking certain medicines that make it hard for the body to absorb vitamin B12. These include: Heartburn medicines, such as antacids and proton pump inhibitors. Some medicines that are used to treat diabetes. What increases the risk? The following factors may make you more likely to develop a vitamin B12 deficiency: Being an older adult. Eating a vegetarian or vegan diet that does not include any foods that come from animals. Eating a poor diet while you are pregnant. Taking certain medicines. Having alcoholism. What are the signs or symptoms? In some cases, there are no symptoms of this condition. If the condition leads to anemia or nerve damage, various symptoms may occur, such as: Weakness. Tiredness (fatigue). Loss of appetite. Numbness or tingling in your hands and feet. Redness and burning of the tongue. Depression,  confusion, or memory problems. Trouble walking. If anemia is severe, symptoms can include: Shortness of breath. Dizziness. Rapid heart rate. How is this diagnosed? This condition may be diagnosed with a blood test to measure the level of vitamin B12 in your blood. You may also have other tests, including: A group of tests that measure certain characteristics of blood cells (complete blood count, CBC). A blood test to measure intrinsic factor. A procedure where a thin tube with a camera on the end is used to look into your stomach or intestines (endoscopy). Other tests may be needed to discover the cause of the deficiency. How is this treated? Treatment for this condition depends on the cause. This condition may be treated by: Changing your eating and drinking habits, such as: Eating more foods that contain vitamin B12. Drinking less alcohol or no alcohol. Getting vitamin B12 injections. Taking vitamin B12 supplements by mouth (orally). Your health care provider will tell you which dose is best for you. Follow these instructions at home: Eating and drinking  Include foods in your diet that come from animals and contain a lot of vitamin B12. These include: Meats and poultry. This includes beef, pork, chicken, turkey, and organ meats, such as liver. Seafood. This includes clams, rainbow trout, salmon, tuna, and haddock. Eggs. Dairy foods such as milk, yogurt, and cheese. Eat foods that have vitamin B12 added to them (are fortified), such as ready-to-eat breakfast cereals. Check the label on the package to see if a food is fortified. The items listed above may not be a complete list of foods and beverages you can eat and drink. Contact a dietitian for   more information. Alcohol use Do not drink alcohol if: Your health care provider tells you not to drink. You are pregnant, may be pregnant, or are planning to become pregnant. If you drink alcohol: Limit how much you have to: 0-1 drink a  day for women. 0-2 drinks a day for men. Know how much alcohol is in your drink. In the [place] one drink equals one 12 oz bottle of beer (355 mL), one 5 oz glass of wine (148 mL), or one 1 oz glass of hard liquor (44 mL). General instructions Get vitamin B12 injections if told to by your health care provider. Take supplements only as told by your health care provider. Follow the directions carefully. Keep all follow-up visits. This is important. Contact a health care provider if: Your symptoms come back. Your symptoms get worse or do not improve with treatment. Get help right away: You develop shortness of breath. You have a rapid heart rate. You have chest pain. You become dizzy or you faint. These symptoms may be an emergency. Get help right away. Call 911. Do not wait to see if the symptoms will go away. Do not drive yourself to the hospital. Summary Vitamin B12 deficiency occurs when the body does not have enough of this important vitamin. Common causes include not eating enough foods that contain vitamin B12, not being able to absorb vitamin B12 from the food that you eat, having a surgery in which part of the stomach or small intestine is removed, or taking certain medicines. Eat foods that have vitamin B12 in them. Treatment may include making a change in the way you eat and drink, getting vitamin B12 injections, or taking vitamin B12 supplements. This information is not intended to replace advice given to you by your health care provider. Make sure you discuss any questions you have with your health care provider. Document Revised: 05/23/2021 Document Reviewed: 05/23/2021 Elsevier Patient Education  2023 [employer]  

## 2022-09-12 NOTE — Addendum Note (Signed)
Addended by: Dimitri Ped on: 09/12/2022 08:15 AM   Modules accepted: Orders

## 2022-09-17 ENCOUNTER — Ambulatory Visit: Payer: Commercial Managed Care - HMO | Admitting: Audiologist

## 2022-09-22 ENCOUNTER — Ambulatory Visit: Payer: Commercial Managed Care - HMO | Attending: Audiologist | Admitting: Audiologist

## 2022-09-22 DIAGNOSIS — H903 Sensorineural hearing loss, bilateral: Secondary | ICD-10-CM | POA: Insufficient documentation

## 2022-09-22 NOTE — Procedures (Signed)
Outpatient Audiology and [facility] [address] [place], [place]  [address] [phone]  AUDIOLOGICAL  EVALUATION  NAME: Meredith Byrd     DOB:   [birth year]      MRN: [id number]                                                                                     DATE: 09/22/2022     REFERENT: Irena Reichmann, DO STATUS: Outpatient DIAGNOSIS: Sensorineural Hearing Loss Bilateral     History: Meredith Byrd was seen for an audiological evaluation. Meredith Byrd was referred for an auditory processing disorder evaluation by Irena Reichmann, MD. Meredith Byrd recently had an evaluation with Dr. Thomasena Edis and was determined to be on the autism spectrum.  Meredith Byrd is receiving a hearing evaluation due to concerns for auditory processing disorder. Meredith Byrd has difficulty hearing any time someone is not facing her. She understands all directions better when written down. Meredith Byrd has significant difficulty hearing in noise.  She struggles to regulate the volume of her own voice. Meredith Byrd has never received a hearing screening. She says she is always told her ear canals are clear and eardrums look healthy. This difficulty hearing began gradually. No pain or pressure reported in either ear. Tinnitus denied for both ears.  No other relevant case history reported.   Evaluation:  Otoscopy showed a clear view of the tympanic membranes, bilaterally Tympanometry results were consistent with normal middle ear function, bilaterally  Audiometric testing was completed using conventional audiometry with insert and high frequency supraural transducer. Speech Recognition Thresholds were  30dB in the right ear and 35dB in the left ear. Word Recognition was performed 40dB SL, scored 92% in the right ear and 100% in the left ear. Pure tone thresholds show mild to moderate hearing loss rising to normal by 8kHz in each ear. Hearing loss is symmetric.  Thresholds consistent across transducer.  QuickSIN (speech  in noise test) performed bilaterally showing moderate difficulty in noise with SNR of 11.5dB.  DPAOEs present in right ear 1.5-8kHz at all twelve frequencies. Could not calibrate in left ear.   Results:  The test results were reviewed with Meredith Byrd. Meredith Byrd has a mild low sensorineural frequency hearing loss in each ear. It is unclear when this hearing loss started. Meredith Byrd says she has felt like she cannot hear well for many years. The low frequency hearing loss will make speech sound muffled and unclear unless someone is face to face in quiet. Meredith Byrd will need to monitor the hearing loss for progression.   Recommendations: Amplification trial recommended for both ears. Hearing aids can be purchased from a variety of locations. See provided list for locations in the [place] area. Also provided Meredith Byrd with a list of over the counter aids. 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 Example headset is: [employer] Amplified USB Headset Captions for all training material required to maximize understanding.  All instructions to be provided in written format when possible. Meredith Byrd cannot be expected to hear all instructions when given auditory only.  Repeat evaluation recommended in six months to monitor hearing loss progression.   43 minutes spent  testing and counseling on results.   Alfonse Alpers  Audiologist, Au.D., CCC-A 09/22/2022  1:59 PM  Cc: Janie Morning, DO

## 2022-09-27 ENCOUNTER — Other Ambulatory Visit: Payer: Self-pay | Admitting: Hematology and Oncology

## 2022-09-27 DIAGNOSIS — D508 Other iron deficiency anemias: Secondary | ICD-10-CM

## 2022-09-28 ENCOUNTER — Inpatient Hospital Stay: Payer: Commercial Managed Care - HMO

## 2022-09-28 ENCOUNTER — Inpatient Hospital Stay (HOSPITAL_BASED_OUTPATIENT_CLINIC_OR_DEPARTMENT_OTHER): Payer: Commercial Managed Care - HMO | Admitting: Hematology and Oncology

## 2022-09-28 ENCOUNTER — Other Ambulatory Visit: Payer: Self-pay

## 2022-09-28 VITALS — BP 120/67 | HR 69 | Temp 98.2°F | Resp 14 | Wt 188.1 lb

## 2022-09-28 DIAGNOSIS — E538 Deficiency of other specified B group vitamins: Secondary | ICD-10-CM

## 2022-09-28 DIAGNOSIS — D508 Other iron deficiency anemias: Secondary | ICD-10-CM

## 2022-09-28 DIAGNOSIS — D563 Thalassemia minor: Secondary | ICD-10-CM

## 2022-09-28 LAB — VITAMIN B12: Vitamin B-12: 437 pg/mL (ref 180–914)

## 2022-09-28 LAB — CBC WITH DIFFERENTIAL (CANCER CENTER ONLY)
Abs Immature Granulocytes: 0.02 10*3/uL (ref 0.00–0.07)
Basophils Absolute: 0 10*3/uL (ref 0.0–0.1)
Basophils Relative: 0 %
Eosinophils Absolute: 0.1 10*3/uL (ref 0.0–0.5)
Eosinophils Relative: 1 %
HCT: 35.5 % — ABNORMAL LOW (ref 36.0–46.0)
Hemoglobin: 11.2 g/dL — ABNORMAL LOW (ref 12.0–15.0)
Immature Granulocytes: 0 %
Lymphocytes Relative: 40 %
Lymphs Abs: 1.9 10*3/uL (ref 0.7–4.0)
MCH: 25 pg — ABNORMAL LOW (ref 26.0–34.0)
MCHC: 31.5 g/dL (ref 30.0–36.0)
MCV: 79.2 fL — ABNORMAL LOW (ref 80.0–100.0)
Monocytes Absolute: 0.2 10*3/uL (ref 0.1–1.0)
Monocytes Relative: 5 %
Neutro Abs: 2.5 10*3/uL (ref 1.7–7.7)
Neutrophils Relative %: 54 %
Platelet Count: 365 10*3/uL (ref 150–400)
RBC: 4.48 MIL/uL (ref 3.87–5.11)
RDW: 15.3 % (ref 11.5–15.5)
WBC Count: 4.7 10*3/uL (ref 4.0–10.5)
nRBC: 0 % (ref 0.0–0.2)

## 2022-09-28 LAB — CMP (CANCER CENTER ONLY)
ALT: 17 U/L (ref 0–44)
AST: 13 U/L — ABNORMAL LOW (ref 15–41)
Albumin: 4.1 g/dL (ref 3.5–5.0)
Alkaline Phosphatase: 47 U/L (ref 38–126)
Anion gap: 5 (ref 5–15)
BUN: 11 mg/dL (ref 6–20)
CO2: 28 mmol/L (ref 22–32)
Calcium: 9.4 mg/dL (ref 8.9–10.3)
Chloride: 107 mmol/L (ref 98–111)
Creatinine: 0.81 mg/dL (ref 0.44–1.00)
GFR, Estimated: >60 mL/min (ref >60)
Glucose, Bld: 77 mg/dL (ref 70–99)
Potassium: 3.7 mmol/L (ref 3.5–5.1)
Sodium: 140 mmol/L (ref 135–145)
Total Bilirubin: 0.4 mg/dL (ref 0.3–1.2)
Total Protein: 6.8 g/dL (ref 6.5–8.1)

## 2022-09-28 LAB — RETIC PANEL
Immature Retic Fract: 16.1 % — ABNORMAL HIGH (ref 2.3–15.9)
RBC.: 4.45 MIL/uL (ref 3.87–5.11)
Retic Count, Absolute: 55.2 10*3/uL (ref 19.0–186.0)
Retic Ct Pct: 1.2 % (ref 0.4–3.1)
Reticulocyte Hemoglobin: 25.3 pg — ABNORMAL LOW (ref >27.9)

## 2022-09-28 LAB — IRON AND IRON BINDING CAPACITY (CC-WL,HP ONLY)
Iron: 41 ug/dL (ref 28–170)
Saturation Ratios: 10 % — ABNORMAL LOW (ref 10.4–31.8)
TIBC: 417 ug/dL (ref 250–450)
UIBC: 376 ug/dL (ref 148–442)

## 2022-09-28 LAB — FERRITIN: Ferritin: 55 ng/mL (ref 11–307)

## 2022-09-28 MED ORDER — CYANOCOBALAMIN 1000 MCG/ML IJ SOLN
1000.0000 ug | Freq: Once | INTRAMUSCULAR | Status: AC
  Administered 2022-09-28: 1000 ug via INTRAMUSCULAR
  Filled 2022-09-28: qty 1

## 2022-09-28 NOTE — Progress Notes (Signed)
[place] [phone]   [phone]  PROGRESS NOTE  Patient Care Team: Janie Morning, DO as PCP - General (Family Medicine) Meisinger, Sherren Mocha, MD as Consulting Physician (Obstetrics and Gynecology) Orson Slick, MD as Consulting Physician (Hematology and Oncology)  Hematological/Oncological History # Iron Deficiency Anemia 2/2 to Gastric Bipass Surgery 10/08/2009: WBC 5.8, Hgb 10.2, MCV 73.1, Plt 458 03/25/2015: WBC 7.0, Hgb 12.5, MCV 77.2, Plt 419 03/17/2016: WBC 3.8, Hgb 10.9, Plt 366, MCV 77.1 05/28/2020: WBC 4.1, Hgb 10.3, MCV 75.2, Plt 517 09/23/2020: establish care with Dr. Lorenso Courier  12/20-12/27/2021: IV feraheme 559m q 7 days x 2 doses and 2 doses of IM Vitamin b12 10060m.  12/26/2020: WBC 4.3, Hgb 11.8, MCV 79.4, Plt 432 01/09/2022: Wbc 5.9, Hgb 12.1, MCV 77.2, Plt 544 09/28/2022: WBC 4.7, Hgb 11.2, MCV 79.2, Plt 365  Interval History:  Meredith Byrd [age].o. female with medical history significant for iron deficiency anemia 2/2 to gastric bipass surgery who presents for a follow up visit. The patient's last visit was on 03/27/2022. In the interim since the last visit she has had no major changes in her health.   On exam today [name] reports she has been "about the same" in the interim since her last visit.  She has had no major changes in her health.  She notes her energy levels are on the lower side and are currently about a 4 out of 10.  She reports she continues getting the vitamin B12 shots every 2 weeks and is going well.  She is not having any side effects, soreness, or issues with the shots.  She reports that she has been getting Depo shots which have been preventing menstrual bleeding.  She is not having any bleeding elsewhere.  Such as nosebleeds, gum bleeding, or dark stools.  She reports that nothing else has been out of the ordinary.  She notes she is eating well and had no major dietary changes.  She notes that if she  could she would [facility].  She denies fevers, chills, shortness of breath, chest pain or cough.She is had no other changes in her health at this time.  A full 10 point ROS is listed below.  MEDICAL HISTORY:  Past Medical History:  Diagnosis Date   Allergy    Anemia    Anxiety    Arthritis pain 08/20/2014   Back pain    COVID-19    GERD (gastroesophageal reflux disease)    History of stress test 02/2011 ([facility])   there was no evidence of ischemia, but she only went 3 1/2 minutes on the treadmill making it very difficult to get a good accurate assessment, however   Hx of echocardiogram    The echocadiogram was essentially normal with the exception of mild mitral calcification and borderline concentric LVH, which in the setting of her hypertension at this early age is something that mean her blood pressure is well controlled.    Hypertension    Iron deficiency anemia, unspecified 07/09/2014   Migraines    Palpitations    Swelling    Vitamin B12 deficiency 03/17/2016    SURGICAL HISTORY: Past Surgical History:  Procedure Laterality Date   BREAST SURGERY     CARPAL TUNNEL RELEASE Right 09/24/2021   Procedure: RIGHT CARPAL TUNNEL RELEASE;  Surgeon: XuLeandrew KoyanagiMD;  [place] [facility]opedics;  Laterality: Right;  CHOLECYSTECTOMY     GASTRIC BYPASS  2008   OOPHORECTOMY Right 2010   REDUCTION MAMMAPLASTY Bilateral     SOCIAL HISTORY: Social History   Socioeconomic History   Marital status: Married    Spouse name: Damon   Number of children: 4   Years of education: Associates   Highest education level: Not on file  Occupational History   Occupation: Insurance Claims  Tobacco Use   Smoking status: Never   Smokeless tobacco: Never  Vaping Use   Vaping Use: Never used  Substance and Sexual Activity   Alcohol use: No   Drug use: No   Sexual activity: Yes  Other Topics Concern    Not on file  Social History Narrative   Right handed.   Lives at home with husband and four children.   No caffeine use.   Social Determinants of Health   Financial Resource Strain: Not on file  Food Insecurity: Not on file  Transportation Needs: Not on file  Physical Activity: Not on file  Stress: Not on file  Social Connections: Not on file  Intimate Partner Violence: Not on file    FAMILY HISTORY: Family History  Problem Relation Age of Onset   Asthma Mother    Heart disease Mother    Diabetes Mother    Early death Mother    Hypertension Mother    Thyroid disease Mother    Anxiety disorder Mother    Hypertension Father    Heart disease Father    Breast cancer Sister 87   Breast cancer Maternal Aunt        under 75   Breast cancer Paternal Aunt    Stroke Maternal Grandmother    Heart disease Maternal Grandmother    Hypertension Maternal Grandmother    Hyperlipidemia Maternal Grandmother    Breast cancer Maternal Grandmother     ALLERGIES:  is allergic to doxycycline, fioricet [butalbital-apap-caffeine], inderal [propranolol], and nsaids.  MEDICATIONS:  Current Outpatient Medications  Medication Sig Dispense Refill   acetaminophen (TYLENOL) 500 MG tablet Take 2 tablets (1,000 mg total) by mouth every 6 (six) hours as needed. 30 tablet 0   acetaminophen-codeine (TYLENOL #3) 300-30 MG tablet Take 1 tablet by mouth 2 (two) times daily as needed for moderate pain. 30 tablet 0   albuterol (VENTOLIN HFA) 108 (90 Base) MCG/ACT inhaler Inhale 1-2 puffs into the lungs every 6 (six) hours as needed for wheezing or shortness of breath. 18 g 0   cloNIDine (CATAPRES) 0.2 MG tablet Take 0.4 mg by mouth at bedtime.     Cyanocobalamin (B-12 COMPLIANCE INJECTION) 1000 MCG/ML KIT Inject as directed.     Galcanezumab-gnlm (EMGALITY) 120 MG/ML SOSY 240 mg subQ (2 consecutive 120-mg doses) once as a loading dose, followed by 120 mg once monthly 3 mL 6   medroxyPROGESTERone  (DEPO-PROVERA) 150 MG/ML injection      montelukast (SINGULAIR) 10 MG tablet Take 10 mg by mouth at bedtime.     Ubrogepant (UBRELVY) 100 MG TABS Take 1 tablet at the onset of migraine. Can repeat in 2 hours if needed. Only 2 tabs/24 hours 12 tablet 11   UNABLE TO FIND Med Name: allergy injections  3 times a week     verapamil (CALAN-SR) 120 MG CR tablet Take 1 tablet (120 mg total) by mouth at bedtime. 30 tablet 11   Vitamin D, Ergocalciferol, (DRISDOL) 1.25 MG (50000 UNIT) CAPS capsule TAKE 1 CAPSULE BY MOUTH EVERY 7 DAYS 12 capsule 0  No current facility-administered medications for this visit.    REVIEW OF SYSTEMS:   Constitutional: ( - ) fevers, ( - )  chills , ( - ) night sweats Eyes: ( - ) blurriness of vision, ( - ) double vision, ( - ) watery eyes Ears, nose, mouth, throat, and face: ( - ) mucositis, ( - ) sore throat Respiratory: ( - ) cough, ( - ) dyspnea, ( - ) wheezes Cardiovascular: ( - ) palpitation, ( - ) chest discomfort, ( - ) lower extremity swelling Gastrointestinal:  ( - ) nausea, ( - ) heartburn, ( - ) change in bowel habits Skin: ( - ) abnormal skin rashes Lymphatics: ( - ) new lymphadenopathy, ( - ) easy bruising Neurological: ( - ) numbness, ( - ) tingling, ( - ) new weaknesses Behavioral/Psych: ( - ) mood change, ( - ) new changes  All other systems were reviewed with the patient and are negative.  PHYSICAL EXAMINATION:  Vitals:   09/28/22 1509  BP: 120/67  Pulse: 69  Resp: 14  Temp: 98.2 F (36.8 C)     Filed Weights   09/28/22 1509  Weight: 188 lb 1.6 oz (85.3 kg)    GENERAL: tired appearing middle aged [place] American female. alert, no distress and comfortable SKIN: skin color, texture, turgor are normal, no rashes or significant lesions EYES: conjunctiva are pink and non-injected, sclera clear LYMPH: Mildly enlarged right submandibular lymph node, nontender LUNGS: clear to auscultation and percussion with normal breathing effort HEART:  regular rate & rhythm and no murmurs and no lower extremity edema Musculoskeletal: no cyanosis of digits and no clubbing  PSYCH: alert & oriented x 3, fluent speech NEURO: no focal motor/sensory deficits  LABORATORY DATA:  I have reviewed the data as listed    Latest Ref Rng & Units 09/28/2022    2:39 PM 06/26/2022    3:47 PM 03/27/2022    9:47 AM  CBC  WBC 4.0 - 10.5 K/uL 4.7  5.2  4.5   Hemoglobin 12.0 - 15.0 g/dL 11.2  11.4  12.0   Hematocrit 36.0 - 46.0 % 35.5  35.5  37.5   Platelets 150 - 400 K/uL 365  363  315        Latest Ref Rng & Units 09/28/2022    2:39 PM 06/26/2022    3:47 PM 03/27/2022    9:47 AM  CMP  Glucose 70 - 99 mg/dL 77  82  98   BUN 6 - 20 mg/dL _0 Creatinine 0.44 - 1.00 mg/dL 0.81  0.57  0.74   Sodium 135 - 145 mmol/L 140  139  140   Potassium 3.5 - 5.1 mmol/L 3.7  3.7  4.5   Chloride 98 - 111 mmol/L 107  109  108   CO2 22 - 32 mmol/L _1 Calcium 8.9 - 10.3 mg/dL 9.4  8.8  9.8   Total Protein 6.5 - 8.1 g/dL 6.8  7.2  7.7   Total Bilirubin 0.3 - 1.2 mg/dL 0.4  0.4  0.5   Alkaline Phos 38 - 126 U/L 47  45  47   AST 15 - 41 U/L _2 ALT 0 - 44 U/L _3 RADIOGRAPHIC STUDIES: I have personally reviewed the radiological images as listed and agreed with the findings in the report. No results found.  ASSESSMENT &  PLAN Marrie M Karbowski 41 y.o. female with medical history significant for iron deficiency anemia 2/2 to gastric bipass surgery who presents for a follow up visit.  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  -- B12 had improved to 447 at last visit, nutritional labs pending today.  --Currently receiving IM B12 injection 1000 mcg q 2 weeks. Recommend to continue. -- labs today show white  blood cell 4.7, hemoglobin 11.2, MCV 79.2, and platelets of 365 --will assess need for IV iron based on current labs.  Iron sat 10%, suspect with have low ferritin today. --Return to clinic in 3 months   #Fatigue, stable --Patient still has marked fatigue despite adequate repletion of iron and vitamin B12.   --normal TSH and hemoglobin A1c from 12/26/2020 --Folate levels have normalized from 03/21/2021 --There is evidence of vitamin D deficiency today so sent prescription of vitamin D supplementation.    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   No orders of the defined types were placed in this encounter.  All questions were answered. The patient knows to call the clinic with any problems, questions or concerns.  I have spent a total of 30 minutes minutes of face-to-face and non-face-to-face time, preparing to see the patient, obtaining and/or reviewing separately obtained history, performing a medically appropriate examination, counseling and educating the patient, ordering medications/tests, documenting clinical information in the electronic health record, and care coordination.   Ledell Peoples, MD Department of Hematology/Oncology [facility] Phone: [phone] Pager: [phone] Email: [email or web address]    09/28/2022 5:40 PM

## 2022-10-01 LAB — METHYLMALONIC ACID, SERUM: Methylmalonic Acid, Quantitative: 118 nmol/L (ref 0–378)

## 2022-10-17 ENCOUNTER — Inpatient Hospital Stay: Payer: 59 | Attending: Hematology and Oncology

## 2022-10-17 ENCOUNTER — Other Ambulatory Visit: Payer: Self-pay

## 2022-10-17 VITALS — BP 122/64 | HR 77 | Temp 97.9°F | Resp 16

## 2022-10-17 DIAGNOSIS — Z79899 Other long term (current) drug therapy: Secondary | ICD-10-CM | POA: Diagnosis not present

## 2022-10-17 DIAGNOSIS — E538 Deficiency of other specified B group vitamins: Secondary | ICD-10-CM | POA: Insufficient documentation

## 2022-10-17 DIAGNOSIS — D508 Other iron deficiency anemias: Secondary | ICD-10-CM

## 2022-10-17 MED ORDER — CYANOCOBALAMIN 1000 MCG/ML IJ SOLN
1000.0000 ug | Freq: Once | INTRAMUSCULAR | Status: AC
  Administered 2022-10-17: 1000 ug via INTRAMUSCULAR
  Filled 2022-10-17: qty 1

## 2022-10-17 NOTE — Patient Instructions (Signed)
Vitamin B12 Deficiency Vitamin B12 deficiency occurs when the body does not have enough of this important vitamin. The body needs this vitamin: To make red blood cells. To make DNA. This is the genetic material inside cells. To help the nerves work properly so they can carry messages from the brain to the body. Vitamin B12 deficiency can cause health problems, such as not having enough red blood cells in the blood (anemia). This can lead to nerve damage if untreated. What are the causes? This condition may be caused by: Not eating enough foods that contain vitamin B12. Not having enough stomach acid and digestive fluids to properly absorb vitamin B12 from the food that you eat. Having certain diseases that make it hard to absorb vitamin B12. These diseases include Crohn's disease, chronic pancreatitis, and cystic fibrosis. An autoimmune disorder in which the body does not make enough of a protein (intrinsic factor) within the stomach, resulting in not enough absorption of vitamin B12. Having a surgery in which part of the stomach or small intestine is removed. Taking certain medicines that make it hard for the body to absorb vitamin B12. These include: Heartburn medicines, such as antacids and proton pump inhibitors. Some medicines that are used to treat diabetes. What increases the risk? The following factors may make you more likely to develop a vitamin B12 deficiency: Being an older adult. Eating a vegetarian or vegan diet that does not include any foods that come from animals. Eating a poor diet while you are pregnant. Taking certain medicines. Having alcoholism. What are the signs or symptoms? In some cases, there are no symptoms of this condition. If the condition leads to anemia or nerve damage, various symptoms may occur, such as: Weakness. Tiredness (fatigue). Loss of appetite. Numbness or tingling in your hands and feet. Redness and burning of the tongue. Depression,  confusion, or memory problems. Trouble walking. If anemia is severe, symptoms can include: Shortness of breath. Dizziness. Rapid heart rate. How is this diagnosed? This condition may be diagnosed with a blood test to measure the level of vitamin B12 in your blood. You may also have other tests, including: A group of tests that measure certain characteristics of blood cells (complete blood count, CBC). A blood test to measure intrinsic factor. A procedure where a thin tube with a camera on the end is used to look into your stomach or intestines (endoscopy). Other tests may be needed to discover the cause of the deficiency. How is this treated? Treatment for this condition depends on the cause. This condition may be treated by: Changing your eating and drinking habits, such as: Eating more foods that contain vitamin B12. Drinking less alcohol or no alcohol. Getting vitamin B12 injections. Taking vitamin B12 supplements by mouth (orally). Your health care provider will tell you which dose is best for you. Follow these instructions at home: Eating and drinking  Include foods in your diet that come from animals and contain a lot of vitamin B12. These include: Meats and poultry. This includes beef, pork, chicken, turkey, and organ meats, such as liver. Seafood. This includes clams, rainbow trout, salmon, tuna, and haddock. Eggs. Dairy foods such as milk, yogurt, and cheese. Eat foods that have vitamin B12 added to them (are fortified), such as ready-to-eat breakfast cereals. Check the label on the package to see if a food is fortified. The items listed above may not be a complete list of foods and beverages you can eat and drink. Contact a dietitian for   more information. Alcohol use Do not drink alcohol if: Your health care provider tells you not to drink. You are pregnant, may be pregnant, or are planning to become pregnant. If you drink alcohol: Limit how much you have to: 0-1 drink a  day for women. 0-2 drinks a day for men. Know how much alcohol is in your drink. In the [place] one drink equals one 12 oz bottle of beer (355 mL), one 5 oz glass of wine (148 mL), or one 1 oz glass of hard liquor (44 mL). General instructions Get vitamin B12 injections if told to by your health care provider. Take supplements only as told by your health care provider. Follow the directions carefully. Keep all follow-up visits. This is important. Contact a health care provider if: Your symptoms come back. Your symptoms get worse or do not improve with treatment. Get help right away: You develop shortness of breath. You have a rapid heart rate. You have chest pain. You become dizzy or you faint. These symptoms may be an emergency. Get help right away. Call 911. Do not wait to see if the symptoms will go away. Do not drive yourself to the hospital. Summary Vitamin B12 deficiency occurs when the body does not have enough of this important vitamin. Common causes include not eating enough foods that contain vitamin B12, not being able to absorb vitamin B12 from the food that you eat, having a surgery in which part of the stomach or small intestine is removed, or taking certain medicines. Eat foods that have vitamin B12 in them. Treatment may include making a change in the way you eat and drink, getting vitamin B12 injections, or taking vitamin B12 supplements. This information is not intended to replace advice given to you by your health care provider. Make sure you discuss any questions you have with your health care provider. Document Revised: 05/23/2021 Document Reviewed: 05/23/2021 Elsevier Patient Education  2023 [employer]  

## 2022-10-24 ENCOUNTER — Ambulatory Visit: Payer: Commercial Managed Care - HMO

## 2022-10-25 ENCOUNTER — Encounter: Payer: Self-pay | Admitting: Hematology and Oncology

## 2022-10-28 ENCOUNTER — Telehealth: Payer: Self-pay

## 2022-10-28 NOTE — Telephone Encounter (Signed)
Called Mrs. Liew about her injection appointments and advised her that due to her work schedule, we would move her injections to 1:30pm. She is also being seen on 12/25/22 by Dr. Lorenso Courier so I rescheduled her B12 to after the appointment with him so she would not have to come in on 12/26/22.  Gardiner Rhyme, RN

## 2022-10-30 ENCOUNTER — Encounter: Payer: Self-pay | Admitting: Hematology and Oncology

## 2022-10-31 ENCOUNTER — Inpatient Hospital Stay: Payer: 59

## 2022-10-31 VITALS — BP 117/77 | HR 61 | Temp 98.8°F | Resp 14

## 2022-10-31 DIAGNOSIS — E538 Deficiency of other specified B group vitamins: Secondary | ICD-10-CM | POA: Diagnosis not present

## 2022-10-31 DIAGNOSIS — D508 Other iron deficiency anemias: Secondary | ICD-10-CM

## 2022-10-31 MED ORDER — CYANOCOBALAMIN 1000 MCG/ML IJ SOLN
1000.0000 ug | Freq: Once | INTRAMUSCULAR | Status: AC
  Administered 2022-10-31: 1000 ug via INTRAMUSCULAR

## 2022-11-14 ENCOUNTER — Inpatient Hospital Stay: Payer: 59 | Attending: Hematology and Oncology

## 2022-11-14 VITALS — BP 112/73 | HR 74 | Temp 98.2°F | Resp 16

## 2022-11-14 DIAGNOSIS — Z79899 Other long term (current) drug therapy: Secondary | ICD-10-CM | POA: Insufficient documentation

## 2022-11-14 DIAGNOSIS — E538 Deficiency of other specified B group vitamins: Secondary | ICD-10-CM | POA: Insufficient documentation

## 2022-11-14 DIAGNOSIS — D508 Other iron deficiency anemias: Secondary | ICD-10-CM

## 2022-11-14 MED ORDER — CYANOCOBALAMIN 1000 MCG/ML IJ SOLN
1000.0000 ug | Freq: Once | INTRAMUSCULAR | Status: AC
  Administered 2022-11-14: 1000 ug via INTRAMUSCULAR

## 2022-11-21 ENCOUNTER — Ambulatory Visit: Payer: Commercial Managed Care - HMO

## 2022-11-28 ENCOUNTER — Inpatient Hospital Stay: Payer: 59

## 2022-11-28 VITALS — BP 138/62 | HR 74 | Temp 99.7°F | Resp 16

## 2022-11-28 DIAGNOSIS — D508 Other iron deficiency anemias: Secondary | ICD-10-CM

## 2022-11-28 DIAGNOSIS — E538 Deficiency of other specified B group vitamins: Secondary | ICD-10-CM

## 2022-11-28 MED ORDER — CYANOCOBALAMIN 1000 MCG/ML IJ SOLN
1000.0000 ug | Freq: Once | INTRAMUSCULAR | Status: AC
  Administered 2022-11-28: 1000 ug via INTRAMUSCULAR
  Filled 2022-11-28: qty 1

## 2022-11-28 NOTE — Patient Instructions (Signed)

## 2022-12-12 ENCOUNTER — Inpatient Hospital Stay: Payer: 59 | Attending: Hematology and Oncology

## 2022-12-12 VITALS — BP 124/73 | HR 84 | Temp 99.7°F | Resp 17

## 2022-12-12 DIAGNOSIS — D509 Iron deficiency anemia, unspecified: Secondary | ICD-10-CM | POA: Diagnosis not present

## 2022-12-12 DIAGNOSIS — D508 Other iron deficiency anemias: Secondary | ICD-10-CM

## 2022-12-12 DIAGNOSIS — Z9884 Bariatric surgery status: Secondary | ICD-10-CM | POA: Insufficient documentation

## 2022-12-12 DIAGNOSIS — D563 Thalassemia minor: Secondary | ICD-10-CM | POA: Insufficient documentation

## 2022-12-12 DIAGNOSIS — E538 Deficiency of other specified B group vitamins: Secondary | ICD-10-CM

## 2022-12-12 DIAGNOSIS — E559 Vitamin D deficiency, unspecified: Secondary | ICD-10-CM | POA: Insufficient documentation

## 2022-12-12 DIAGNOSIS — Z79899 Other long term (current) drug therapy: Secondary | ICD-10-CM | POA: Diagnosis not present

## 2022-12-12 MED ORDER — CYANOCOBALAMIN 1000 MCG/ML IJ SOLN
1000.0000 ug | Freq: Once | INTRAMUSCULAR | Status: AC
  Administered 2022-12-12: 1000 ug via INTRAMUSCULAR
  Filled 2022-12-12: qty 1

## 2022-12-18 ENCOUNTER — Ambulatory Visit: Payer: Commercial Managed Care - HMO

## 2022-12-18 ENCOUNTER — Other Ambulatory Visit: Payer: Commercial Managed Care - HMO

## 2022-12-25 ENCOUNTER — Other Ambulatory Visit: Payer: Self-pay | Admitting: Hematology and Oncology

## 2022-12-25 ENCOUNTER — Other Ambulatory Visit: Payer: Self-pay

## 2022-12-25 ENCOUNTER — Inpatient Hospital Stay (HOSPITAL_BASED_OUTPATIENT_CLINIC_OR_DEPARTMENT_OTHER): Payer: 59 | Admitting: Hematology and Oncology

## 2022-12-25 ENCOUNTER — Inpatient Hospital Stay: Payer: 59

## 2022-12-25 ENCOUNTER — Encounter: Payer: Self-pay | Admitting: Hematology and Oncology

## 2022-12-25 VITALS — BP 103/74 | HR 70 | Temp 97.7°F | Resp 15 | Wt 194.0 lb

## 2022-12-25 DIAGNOSIS — D563 Thalassemia minor: Secondary | ICD-10-CM | POA: Diagnosis not present

## 2022-12-25 DIAGNOSIS — E538 Deficiency of other specified B group vitamins: Secondary | ICD-10-CM | POA: Diagnosis not present

## 2022-12-25 DIAGNOSIS — D508 Other iron deficiency anemias: Secondary | ICD-10-CM

## 2022-12-25 LAB — IRON AND IRON BINDING CAPACITY (CC-WL,HP ONLY)
Iron: 45 ug/dL (ref 28–170)
Saturation Ratios: 11 % (ref 10.4–31.8)
TIBC: 405 ug/dL (ref 250–450)
UIBC: 360 ug/dL (ref 148–442)

## 2022-12-25 LAB — FERRITIN: Ferritin: 62 ng/mL (ref 11–307)

## 2022-12-25 LAB — CBC WITH DIFFERENTIAL (CANCER CENTER ONLY)
Abs Immature Granulocytes: 0.01 10*3/uL (ref 0.00–0.07)
Basophils Absolute: 0 10*3/uL (ref 0.0–0.1)
Basophils Relative: 0 %
Eosinophils Absolute: 0 10*3/uL (ref 0.0–0.5)
Eosinophils Relative: 1 %
HCT: 35.4 % — ABNORMAL LOW (ref 36.0–46.0)
Hemoglobin: 11.2 g/dL — ABNORMAL LOW (ref 12.0–15.0)
Immature Granulocytes: 0 %
Lymphocytes Relative: 33 %
Lymphs Abs: 1.5 10*3/uL (ref 0.7–4.0)
MCH: 25.1 pg — ABNORMAL LOW (ref 26.0–34.0)
MCHC: 31.6 g/dL (ref 30.0–36.0)
MCV: 79.2 fL — ABNORMAL LOW (ref 80.0–100.0)
Monocytes Absolute: 0.2 10*3/uL (ref 0.1–1.0)
Monocytes Relative: 5 %
Neutro Abs: 2.7 10*3/uL (ref 1.7–7.7)
Neutrophils Relative %: 61 %
Platelet Count: 454 10*3/uL — ABNORMAL HIGH (ref 150–400)
RBC: 4.47 MIL/uL (ref 3.87–5.11)
RDW: 15.4 % (ref 11.5–15.5)
WBC Count: 4.5 10*3/uL (ref 4.0–10.5)
nRBC: 0 % (ref 0.0–0.2)

## 2022-12-25 LAB — CMP (CANCER CENTER ONLY)
ALT: 14 U/L (ref 0–44)
AST: 12 U/L — ABNORMAL LOW (ref 15–41)
Albumin: 4.2 g/dL (ref 3.5–5.0)
Alkaline Phosphatase: 57 U/L (ref 38–126)
Anion gap: 4 — ABNORMAL LOW (ref 5–15)
BUN: 8 mg/dL (ref 6–20)
CO2: 28 mmol/L (ref 22–32)
Calcium: 9.1 mg/dL (ref 8.9–10.3)
Chloride: 108 mmol/L (ref 98–111)
Creatinine: 0.69 mg/dL (ref 0.44–1.00)
GFR, Estimated: >60 mL/min (ref >60)
Glucose, Bld: 88 mg/dL (ref 70–99)
Potassium: 3.6 mmol/L (ref 3.5–5.1)
Sodium: 140 mmol/L (ref 135–145)
Total Bilirubin: 0.5 mg/dL (ref 0.3–1.2)
Total Protein: 7.5 g/dL (ref 6.5–8.1)

## 2022-12-25 LAB — RETIC PANEL
Immature Retic Fract: 16.6 % — ABNORMAL HIGH (ref 2.3–15.9)
RBC.: 4.42 MIL/uL (ref 3.87–5.11)
Retic Count, Absolute: 66.3 10*3/uL (ref 19.0–186.0)
Retic Ct Pct: 1.5 % (ref 0.4–3.1)
Reticulocyte Hemoglobin: 25.5 pg — ABNORMAL LOW (ref >27.9)

## 2022-12-25 LAB — VITAMIN B12: Vitamin B-12: 502 pg/mL (ref 180–914)

## 2022-12-25 NOTE — Progress Notes (Signed)
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 Lorenso Courier  12/20-12/27/2021: IV feraheme 510mg  q 7 days x 2 doses and 2 doses of IM Vitamin b12 1067mcg.  12/26/2020: WBC 4.3, Hgb 11.8, MCV 79.4, Plt 432 01/09/2022: Wbc 5.9, Hgb 12.1, MCV 77.2, Plt 544 09/28/2022: WBC 4.7, Hgb 11.2, MCV 79.2, Plt 365 12/25/2022: WBC 4.5, Hgb 11.2, MCV 79.2, Plt 454   Interval History:  Meredith Byrd 42 y.o. female with medical history significant for iron deficiency anemia 2/2 to gastric bipass surgery who presents for a follow up visit. The patient's last visit was on 09/28/2022. In the interim since the last visit she has had no major changes in her health.   On exam today Meredith Byrd reports she feels about the same she did during her last visit.  She notes her energy is only about a 4 out of 10.  She notes that she has been working with her PCP to try to find a source of the fatigue.  She notes she is not having any issues with bleeding, bruising, or dark stools.  She notes that she is not having any lightheadedness, dizziness, shortness of breath.  Her appetite is good and she is not having any trouble with nausea, vomiting, or diarrhea.  Overall she is at her baseline level of health and is willing and able to proceed with vitamin B12 shots at this time.  She denies fevers, chills, shortness of breath, chest pain or cough. She is  had no other changes in her health at this time.  A full 10 point ROS is listed below.  MEDICAL HISTORY:  Past Medical History:  Diagnosis Date   Allergy    Anemia    Anxiety    Arthritis pain 08/20/2014   Back pain    COVID-19    GERD (gastroesophageal reflux disease)    History of stress test 02/2011 ([facility])   there was no evidence of ischemia, but she only went 3 1/2 minutes on the treadmill making it very difficult to get a good accurate assessment, however   Hx of echocardiogram    The echocadiogram was essentially normal with the exception of mild mitral calcification and borderline concentric LVH, which in the setting of her hypertension at this early age is something that mean her blood pressure is well controlled.    Hypertension    Iron deficiency anemia, unspecified 07/09/2014   Migraines    Palpitations    Swelling    Vitamin B12 deficiency 03/17/2016    SURGICAL HISTORY: Past Surgical History:  Procedure Laterality Date   BREAST SURGERY     CARPAL TUNNEL RELEASE Right 09/24/2021   Procedure: RIGHT CARPAL TUNNEL RELEASE;  Surgeon: Leandrew Koyanagi, MD;  Location: [facility];  Service: Orthopedics;  Laterality: Right;   CHOLECYSTECTOMY     GASTRIC BYPASS  2008   OOPHORECTOMY Right 2010   REDUCTION MAMMAPLASTY Bilateral     SOCIAL HISTORY: Social History  Socioeconomic History   Marital status: Married    Spouse name: Damon   Number of children: 4   Years of education: Associates   Highest education level: Not on file  Occupational History   Occupation: Insurance Claims  Tobacco Use   Smoking status: Never   Smokeless tobacco: Never  Vaping Use   Vaping Use: Never used  Substance and Sexual Activity   Alcohol use: No   Drug use: No   Sexual activity: Yes  Other Topics Concern   Not on file  Social History Narrative   Right handed.   Lives at home with husband and four children.   No caffeine use.   Social Determinants of Health    Financial Resource Strain: Not on file  Food Insecurity: Not on file  Transportation Needs: Not on file  Physical Activity: Not on file  Stress: Not on file  Social Connections: Not on file  Intimate Partner Violence: Not on file    FAMILY HISTORY: Family History  Problem Relation Age of Onset   Asthma Mother    Heart disease Mother    Diabetes Mother    Early death Mother    Hypertension Mother    Thyroid disease Mother    Anxiety disorder Mother    Hypertension Father    Heart disease Father    Breast cancer Sister 44   Breast cancer Maternal Aunt        under 32   Breast cancer Paternal Aunt    Stroke Maternal Grandmother    Heart disease Maternal Grandmother    Hypertension Maternal Grandmother    Hyperlipidemia Maternal Grandmother    Breast cancer Maternal Grandmother     ALLERGIES:  is allergic to doxycycline, fioricet [butalbital-apap-caffeine], inderal [propranolol], and nsaids.  MEDICATIONS:  Current Outpatient Medications  Medication Sig Dispense Refill   acetaminophen (TYLENOL) 500 MG tablet Take 2 tablets (1,000 mg total) by mouth every 6 (six) hours as needed. 30 tablet 0   acetaminophen-codeine (TYLENOL #3) 300-30 MG tablet Take 1 tablet by mouth 2 (two) times daily as needed for moderate pain. 30 tablet 0   albuterol (VENTOLIN HFA) 108 (90 Base) MCG/ACT inhaler Inhale 1-2 puffs into the lungs every 6 (six) hours as needed for wheezing or shortness of breath. 18 g 0   cloNIDine (CATAPRES) 0.2 MG tablet Take 0.4 mg by mouth at bedtime.     Cyanocobalamin (B-12 COMPLIANCE INJECTION) 1000 MCG/ML KIT Inject as directed.     Galcanezumab-gnlm (EMGALITY) 120 MG/ML SOSY 240 mg subQ (2 consecutive 120-mg doses) once as a loading dose, followed by 120 mg once monthly 3 mL 6   medroxyPROGESTERone (DEPO-PROVERA) 150 MG/ML injection      montelukast (SINGULAIR) 10 MG tablet Take 10 mg by mouth at bedtime.     Ubrogepant (UBRELVY) 100 MG TABS Take 1 tablet at the  onset of migraine. Can repeat in 2 hours if needed. Only 2 tabs/24 hours 12 tablet 11   UNABLE TO FIND Med Name: allergy injections  3 times a week     verapamil (CALAN-SR) 120 MG CR tablet Take 1 tablet (120 mg total) by mouth at bedtime. 30 tablet 11   Vitamin D, Ergocalciferol, (DRISDOL) 1.25 MG (50000 UNIT) CAPS capsule TAKE 1 CAPSULE BY MOUTH EVERY 7 DAYS 12 capsule 0   No current facility-administered medications for this visit.    REVIEW OF SYSTEMS:   Constitutional: ( - ) fevers, ( - )  chills , ( - )  night sweats Eyes: ( - ) blurriness of vision, ( - ) double vision, ( - ) watery eyes Ears, nose, mouth, throat, and face: ( - ) mucositis, ( - ) sore throat Respiratory: ( - ) cough, ( - ) dyspnea, ( - ) wheezes Cardiovascular: ( - ) palpitation, ( - ) chest discomfort, ( - ) lower extremity swelling Gastrointestinal:  ( - ) nausea, ( - ) heartburn, ( - ) change in bowel habits Skin: ( - ) abnormal skin rashes Lymphatics: ( - ) new lymphadenopathy, ( - ) easy bruising Neurological: ( - ) numbness, ( - ) tingling, ( - ) new weaknesses Behavioral/Psych: ( - ) mood change, ( - ) new changes  All other systems were reviewed with the patient and are negative.  PHYSICAL EXAMINATION:  Vitals:   12/25/22 1519  BP: 103/74  Pulse: 70  Resp: 15  Temp: 97.7 F (36.5 C)     Filed Weights   12/25/22 1519  Weight: 194 lb (88 kg)    GENERAL: tired appearing middle aged [place] American female. alert, no distress and comfortable SKIN: skin color, texture, turgor are normal, no rashes or significant lesions EYES: conjunctiva are pink and non-injected, sclera clear LYMPH: Mildly enlarged right submandibular lymph node, nontender LUNGS: clear to auscultation and percussion with normal breathing effort HEART: regular rate & rhythm and no murmurs and no lower extremity edema Musculoskeletal: no cyanosis of digits and no clubbing  PSYCH: alert & oriented x 3, fluent speech NEURO: no  focal motor/sensory deficits  LABORATORY DATA:  I have reviewed the data as listed    Latest Ref Rng & Units 12/25/2022    2:38 PM 09/28/2022    2:39 PM 06/26/2022    3:47 PM  CBC  WBC 4.0 - 10.5 K/uL 4.5  4.7  5.2   Hemoglobin 12.0 - 15.0 g/dL 11.2  11.2  11.4   Hematocrit 36.0 - 46.0 % 35.4  35.5  35.5   Platelets 150 - 400 K/uL 454  365  363        Latest Ref Rng & Units 12/25/2022    2:38 PM 09/28/2022    2:39 PM 06/26/2022    3:47 PM  CMP  Glucose 70 - 99 mg/dL 88  77  82   BUN 6 - 20 mg/dL 8  11  10    Creatinine 0.44 - 1.00 mg/dL 0.69  0.81  0.57   Sodium 135 - 145 mmol/L 140  140  139   Potassium 3.5 - 5.1 mmol/L 3.6  3.7  3.7   Chloride 98 - 111 mmol/L 108  107  109   CO2 22 - 32 mmol/L 28  28  26    Calcium 8.9 - 10.3 mg/dL 9.1  9.4  8.8   Total Protein 6.5 - 8.1 g/dL 7.5  6.8  7.2   Total Bilirubin 0.3 - 1.2 mg/dL 0.5  0.4  0.4   Alkaline Phos 38 - 126 U/L 57  47  45   AST 15 - 41 U/L 12  13  15    ALT 0 - 44 U/L 14  17  14      RADIOGRAPHIC STUDIES: I have personally reviewed the radiological images as listed and agreed with the findings in the report. No results found.  ASSESSMENT & PLAN Meredith Byrd 42 y.o. female with medical history significant for iron deficiency anemia 2/2 to gastric bipass surgery who presents for a follow up visit.  # Iron  Deficiency Anemia 2/2 to Gastric Bipass Surgery #Vitamin B12 Deficiency 2/2 to Gastric Bipass Surgery --Findings are most consistent with iron deficiency anemia/Vitamin B12 deficiency secondary to poor absorption in the setting of gastric bypass surgery.  -- B12 had improved to 502 at last visit, nutritional labs pending today.  --Currently receiving IM B12 injection 1000 mcg q 2 weeks. Recommend to continue. -- labs today show WBC 4.5, Hgb 11.2, MCV 79.2, Plt 454  --will assess need for IV iron based on current labs.  --Return to clinic in 3 months   #Fatigue, stable --Patient still has marked fatigue  despite adequate repletion of iron and vitamin B12.   --normal TSH and hemoglobin A1c from 12/26/2020 --Folate levels have normalized from 03/21/2021 --There is evidence of vitamin D deficiency today so sent prescription of vitamin D supplementation.    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   No orders of the defined types were placed in this encounter.  All questions were answered. The patient knows to call the clinic with any problems, questions or concerns.  I have spent a total of 30 minutes minutes of face-to-face and non-face-to-face time, preparing to see the patient, obtaining and/or reviewing separately obtained history, performing a medically appropriate examination, counseling and educating the patient, ordering medications/tests, documenting clinical information in the electronic health record, and care coordination.   Ledell Peoples, MD Department of Hematology/Oncology [place] [facility] Phone: [phone] Pager: [phone] Email: [email or web address]    12/27/2022 1:19 PM

## 2022-12-26 ENCOUNTER — Inpatient Hospital Stay: Payer: 59

## 2022-12-26 VITALS — BP 107/83 | HR 96 | Temp 97.7°F | Resp 14

## 2022-12-26 DIAGNOSIS — D508 Other iron deficiency anemias: Secondary | ICD-10-CM

## 2022-12-26 DIAGNOSIS — E538 Deficiency of other specified B group vitamins: Secondary | ICD-10-CM

## 2022-12-26 MED ORDER — CYANOCOBALAMIN 1000 MCG/ML IJ SOLN
1000.0000 ug | Freq: Once | INTRAMUSCULAR | Status: AC
  Administered 2022-12-26: 1000 ug via INTRAMUSCULAR
  Filled 2022-12-26: qty 1

## 2022-12-26 NOTE — Patient Instructions (Signed)
Vitamin B12 Deficiency Vitamin B12 deficiency occurs when the body does not have enough of this important vitamin. The body needs this vitamin: To make red blood cells. To make DNA. This is the genetic material inside cells. To help the nerves work properly so they can carry messages from the brain to the body. Vitamin B12 deficiency can cause health problems, such as not having enough red blood cells in the blood (anemia). This can lead to nerve damage if untreated. What are the causes? This condition may be caused by: Not eating enough foods that contain vitamin B12. Not having enough stomach acid and digestive fluids to properly absorb vitamin B12 from the food that you eat. Having certain diseases that make it hard to absorb vitamin B12. These diseases include Crohn's disease, chronic pancreatitis, and cystic fibrosis. An autoimmune disorder in which the body does not make enough of a protein (intrinsic factor) within the stomach, resulting in not enough absorption of vitamin B12. Having a surgery in which part of the stomach or small intestine is removed. Taking certain medicines that make it hard for the body to absorb vitamin B12. These include: Heartburn medicines, such as antacids and proton pump inhibitors. Some medicines that are used to treat diabetes. What increases the risk? The following factors may make you more likely to develop a vitamin B12 deficiency: Being an older adult. Eating a vegetarian or vegan diet that does not include any foods that come from animals. Eating a poor diet while you are pregnant. Taking certain medicines. Having alcoholism. What are the signs or symptoms? In some cases, there are no symptoms of this condition. If the condition leads to anemia or nerve damage, various symptoms may occur, such as: Weakness. Tiredness (fatigue). Loss of appetite. Numbness or tingling in your hands and feet. Redness and burning of the tongue. Depression,  confusion, or memory problems. Trouble walking. If anemia is severe, symptoms can include: Shortness of breath. Dizziness. Rapid heart rate. How is this diagnosed? This condition may be diagnosed with a blood test to measure the level of vitamin B12 in your blood. You may also have other tests, including: A group of tests that measure certain characteristics of blood cells (complete blood count, CBC). A blood test to measure intrinsic factor. A procedure where a thin tube with a camera on the end is used to look into your stomach or intestines (endoscopy). Other tests may be needed to discover the cause of the deficiency. How is this treated? Treatment for this condition depends on the cause. This condition may be treated by: Changing your eating and drinking habits, such as: Eating more foods that contain vitamin B12. Drinking less alcohol or no alcohol. Getting vitamin B12 injections. Taking vitamin B12 supplements by mouth (orally). Your health care provider will tell you which dose is best for you. Follow these instructions at home: Eating and drinking  Include foods in your diet that come from animals and contain a lot of vitamin B12. These include: Meats and poultry. This includes beef, pork, chicken, turkey, and organ meats, such as liver. Seafood. This includes clams, rainbow trout, salmon, tuna, and haddock. Eggs. Dairy foods such as milk, yogurt, and cheese. Eat foods that have vitamin B12 added to them (are fortified), such as ready-to-eat breakfast cereals. Check the label on the package to see if a food is fortified. The items listed above may not be a complete list of foods and beverages you can eat and drink. Contact a dietitian for   more information. Alcohol use Do not drink alcohol if: Your health care provider tells you not to drink. You are pregnant, may be pregnant, or are planning to become pregnant. If you drink alcohol: Limit how much you have to: 0-1 drink a  day for women. 0-2 drinks a day for men. Know how much alcohol is in your drink. In the [place] one drink equals one 12 oz bottle of beer (355 mL), one 5 oz glass of wine (148 mL), or one 1 oz glass of hard liquor (44 mL). General instructions Get vitamin B12 injections if told to by your health care provider. Take supplements only as told by your health care provider. Follow the directions carefully. Keep all follow-up visits. This is important. Contact a health care provider if: Your symptoms come back. Your symptoms get worse or do not improve with treatment. Get help right away: You develop shortness of breath. You have a rapid heart rate. You have chest pain. You become dizzy or you faint. These symptoms may be an emergency. Get help right away. Call 911. Do not wait to see if the symptoms will go away. Do not drive yourself to the hospital. Summary Vitamin B12 deficiency occurs when the body does not have enough of this important vitamin. Common causes include not eating enough foods that contain vitamin B12, not being able to absorb vitamin B12 from the food that you eat, having a surgery in which part of the stomach or small intestine is removed, or taking certain medicines. Eat foods that have vitamin B12 in them. Treatment may include making a change in the way you eat and drink, getting vitamin B12 injections, or taking vitamin B12 supplements. This information is not intended to replace advice given to you by your health care provider. Make sure you discuss any questions you have with your health care provider. Document Revised: 05/23/2021 Document Reviewed: 05/23/2021 Elsevier Patient Education  2023 [employer]  

## 2022-12-27 ENCOUNTER — Encounter: Payer: Self-pay | Admitting: Hematology and Oncology

## 2023-01-09 ENCOUNTER — Inpatient Hospital Stay: Payer: 59

## 2023-01-09 VITALS — BP 121/70 | HR 79 | Temp 99.1°F | Resp 16

## 2023-01-09 DIAGNOSIS — D508 Other iron deficiency anemias: Secondary | ICD-10-CM

## 2023-01-09 DIAGNOSIS — E538 Deficiency of other specified B group vitamins: Secondary | ICD-10-CM

## 2023-01-09 MED ORDER — CYANOCOBALAMIN 1000 MCG/ML IJ SOLN
1000.0000 ug | Freq: Once | INTRAMUSCULAR | Status: AC
  Administered 2023-01-09: 1000 ug via INTRAMUSCULAR

## 2023-01-23 ENCOUNTER — Inpatient Hospital Stay: Payer: 59 | Attending: Hematology and Oncology

## 2023-01-23 VITALS — BP 128/70 | HR 76 | Temp 98.6°F | Resp 20

## 2023-01-23 DIAGNOSIS — D508 Other iron deficiency anemias: Secondary | ICD-10-CM

## 2023-01-23 DIAGNOSIS — E538 Deficiency of other specified B group vitamins: Secondary | ICD-10-CM | POA: Insufficient documentation

## 2023-01-23 MED ORDER — CYANOCOBALAMIN 1000 MCG/ML IJ SOLN
1000.0000 ug | Freq: Once | INTRAMUSCULAR | Status: AC
  Administered 2023-01-23: 1000 ug via INTRAMUSCULAR

## 2023-02-06 ENCOUNTER — Inpatient Hospital Stay: Payer: 59

## 2023-02-06 VITALS — BP 99/55 | HR 72 | Temp 97.9°F | Resp 18

## 2023-02-06 DIAGNOSIS — E538 Deficiency of other specified B group vitamins: Secondary | ICD-10-CM | POA: Diagnosis not present

## 2023-02-06 DIAGNOSIS — D508 Other iron deficiency anemias: Secondary | ICD-10-CM

## 2023-02-06 MED ORDER — CYANOCOBALAMIN 1000 MCG/ML IJ SOLN
1000.0000 ug | Freq: Once | INTRAMUSCULAR | Status: AC
  Administered 2023-02-06: 1000 ug via INTRAMUSCULAR

## 2023-02-20 ENCOUNTER — Inpatient Hospital Stay: Payer: 59 | Attending: Hematology and Oncology

## 2023-02-20 VITALS — BP 114/70 | HR 71 | Temp 98.9°F | Resp 15

## 2023-02-20 DIAGNOSIS — Z79899 Other long term (current) drug therapy: Secondary | ICD-10-CM | POA: Insufficient documentation

## 2023-02-20 DIAGNOSIS — E538 Deficiency of other specified B group vitamins: Secondary | ICD-10-CM | POA: Diagnosis not present

## 2023-02-20 DIAGNOSIS — D508 Other iron deficiency anemias: Secondary | ICD-10-CM

## 2023-02-20 MED ORDER — CYANOCOBALAMIN 1000 MCG/ML IJ SOLN
1000.0000 ug | Freq: Once | INTRAMUSCULAR | Status: AC
  Administered 2023-02-20: 1000 ug via INTRAMUSCULAR

## 2023-03-03 ENCOUNTER — Encounter: Payer: Self-pay | Admitting: Hematology and Oncology

## 2023-03-06 ENCOUNTER — Inpatient Hospital Stay: Payer: 59

## 2023-03-06 VITALS — BP 115/80 | HR 88 | Temp 97.8°F | Resp 19

## 2023-03-06 DIAGNOSIS — D508 Other iron deficiency anemias: Secondary | ICD-10-CM

## 2023-03-06 DIAGNOSIS — E538 Deficiency of other specified B group vitamins: Secondary | ICD-10-CM

## 2023-03-06 MED ORDER — CYANOCOBALAMIN 1000 MCG/ML IJ SOLN
1000.0000 ug | Freq: Once | INTRAMUSCULAR | Status: AC
  Administered 2023-03-06: 1000 ug via INTRAMUSCULAR

## 2023-03-15 ENCOUNTER — Other Ambulatory Visit (HOSPITAL_COMMUNITY): Payer: Self-pay

## 2023-03-15 ENCOUNTER — Telehealth: Payer: Self-pay

## 2023-03-15 ENCOUNTER — Encounter: Payer: Self-pay | Admitting: Hematology and Oncology

## 2023-03-15 ENCOUNTER — Telehealth: Payer: Self-pay | Admitting: Neurology

## 2023-03-15 NOTE — Telephone Encounter (Signed)
Please call patient and let her know that I will forward her request to our pa team to complete.

## 2023-03-15 NOTE — Telephone Encounter (Signed)
Pt stated she needs PA on Ubrogepant (UBRELVY) 100 MG TABS.  Stated she was told by [facility] they have been sending fax since first of the month.

## 2023-03-15 NOTE — Telephone Encounter (Signed)
Pharmacy Patient Advocate Encounter   Received notification from [employer] that prior authorization for Ubrelvy 100MG  tablets is required/requested.   PA submitted to [facility] via CoverMyMeds Key or (Medicaid) confirmation # [id number] Status is pending

## 2023-03-16 ENCOUNTER — Other Ambulatory Visit (HOSPITAL_COMMUNITY): Payer: Self-pay

## 2023-03-16 NOTE — Telephone Encounter (Addendum)
A new telephone call encounter has been made for this PA Request-please see telephone note dated ...03/15/2023. 

## 2023-03-16 NOTE — Telephone Encounter (Signed)
Pharmacy Patient Advocate Encounter  Prior Authorization for Ubrelvy 100MG  tablets has been APPROVED by [facility] from 03/15/2023 to 03/14/2024.   PA # PA Case ID #: [id number]  Copay is $0 per [facility] test claim.

## 2023-03-18 ENCOUNTER — Encounter: Payer: Self-pay | Admitting: Adult Health

## 2023-03-18 ENCOUNTER — Ambulatory Visit: Payer: 59 | Admitting: Adult Health

## 2023-03-20 ENCOUNTER — Inpatient Hospital Stay: Payer: 59 | Attending: Hematology and Oncology

## 2023-03-20 ENCOUNTER — Telehealth: Payer: Self-pay

## 2023-03-20 DIAGNOSIS — E538 Deficiency of other specified B group vitamins: Secondary | ICD-10-CM | POA: Insufficient documentation

## 2023-03-20 DIAGNOSIS — Z9884 Bariatric surgery status: Secondary | ICD-10-CM | POA: Insufficient documentation

## 2023-03-20 DIAGNOSIS — D508 Other iron deficiency anemias: Secondary | ICD-10-CM | POA: Insufficient documentation

## 2023-03-20 NOTE — Telephone Encounter (Signed)
Attempted to call pt in regards to inj appt she Meredith Byrd/NS. LVM for call back to r/s.

## 2023-03-23 ENCOUNTER — Encounter: Payer: Self-pay | Admitting: Hematology and Oncology

## 2023-03-24 ENCOUNTER — Other Ambulatory Visit: Payer: Self-pay | Admitting: Physician Assistant

## 2023-03-24 DIAGNOSIS — D508 Other iron deficiency anemias: Secondary | ICD-10-CM

## 2023-03-24 DIAGNOSIS — E538 Deficiency of other specified B group vitamins: Secondary | ICD-10-CM

## 2023-03-26 ENCOUNTER — Other Ambulatory Visit: Payer: Self-pay

## 2023-03-26 ENCOUNTER — Inpatient Hospital Stay (HOSPITAL_BASED_OUTPATIENT_CLINIC_OR_DEPARTMENT_OTHER): Payer: 59 | Admitting: Physician Assistant

## 2023-03-26 ENCOUNTER — Inpatient Hospital Stay: Payer: 59

## 2023-03-26 ENCOUNTER — Inpatient Hospital Stay: Payer: 59 | Admitting: Hematology and Oncology

## 2023-03-26 VITALS — BP 118/88 | HR 67 | Temp 99.9°F | Resp 18 | Wt 192.6 lb

## 2023-03-26 VITALS — BP 119/60 | HR 83 | Resp 17

## 2023-03-26 DIAGNOSIS — D508 Other iron deficiency anemias: Secondary | ICD-10-CM

## 2023-03-26 DIAGNOSIS — E538 Deficiency of other specified B group vitamins: Secondary | ICD-10-CM | POA: Diagnosis present

## 2023-03-26 DIAGNOSIS — Z9884 Bariatric surgery status: Secondary | ICD-10-CM | POA: Diagnosis not present

## 2023-03-26 LAB — CBC WITH DIFFERENTIAL (CANCER CENTER ONLY)
Abs Immature Granulocytes: 0.01 10*3/uL (ref 0.00–0.07)
Basophils Absolute: 0 10*3/uL (ref 0.0–0.1)
Basophils Relative: 0 %
Eosinophils Absolute: 0 10*3/uL (ref 0.0–0.5)
Eosinophils Relative: 0 %
HCT: 36.4 % (ref 36.0–46.0)
Hemoglobin: 11.2 g/dL — ABNORMAL LOW (ref 12.0–15.0)
Immature Granulocytes: 0 %
Lymphocytes Relative: 33 %
Lymphs Abs: 1.5 10*3/uL (ref 0.7–4.0)
MCH: 24.6 pg — ABNORMAL LOW (ref 26.0–34.0)
MCHC: 30.8 g/dL (ref 30.0–36.0)
MCV: 79.8 fL — ABNORMAL LOW (ref 80.0–100.0)
Monocytes Absolute: 0.3 10*3/uL (ref 0.1–1.0)
Monocytes Relative: 6 %
Neutro Abs: 2.7 10*3/uL (ref 1.7–7.7)
Neutrophils Relative %: 61 %
Platelet Count: 471 10*3/uL — ABNORMAL HIGH (ref 150–400)
RBC: 4.56 MIL/uL (ref 3.87–5.11)
RDW: 16 % — ABNORMAL HIGH (ref 11.5–15.5)
WBC Count: 4.5 10*3/uL (ref 4.0–10.5)
nRBC: 0 % (ref 0.0–0.2)

## 2023-03-26 LAB — CMP (CANCER CENTER ONLY)
ALT: 15 U/L (ref 0–44)
AST: 13 U/L — ABNORMAL LOW (ref 15–41)
Albumin: 4 g/dL (ref 3.5–5.0)
Alkaline Phosphatase: 57 U/L (ref 38–126)
Anion gap: 8 (ref 5–15)
BUN: 9 mg/dL (ref 6–20)
CO2: 26 mmol/L (ref 22–32)
Calcium: 9.7 mg/dL (ref 8.9–10.3)
Chloride: 107 mmol/L (ref 98–111)
Creatinine: 0.56 mg/dL (ref 0.44–1.00)
GFR, Estimated: >60 mL/min (ref >60)
Glucose, Bld: 107 mg/dL — ABNORMAL HIGH (ref 70–99)
Potassium: 3.9 mmol/L (ref 3.5–5.1)
Sodium: 141 mmol/L (ref 135–145)
Total Bilirubin: 0.4 mg/dL (ref 0.3–1.2)
Total Protein: 7.3 g/dL (ref 6.5–8.1)

## 2023-03-26 LAB — IRON AND IRON BINDING CAPACITY (CC-WL,HP ONLY)
Iron: 56 ug/dL (ref 28–170)
Saturation Ratios: 13 % (ref 10.4–31.8)
TIBC: 430 ug/dL (ref 250–450)
UIBC: 374 ug/dL (ref 148–442)

## 2023-03-26 LAB — VITAMIN B12: Vitamin B-12: 557 pg/mL (ref 180–914)

## 2023-03-26 LAB — FERRITIN: Ferritin: 55 ng/mL (ref 11–307)

## 2023-03-26 MED ORDER — CYANOCOBALAMIN 1000 MCG/ML IJ SOLN
1000.0000 ug | Freq: Once | INTRAMUSCULAR | Status: AC
  Administered 2023-03-26: 1000 ug via INTRAMUSCULAR
  Filled 2023-03-26: qty 1

## 2023-03-26 NOTE — Progress Notes (Signed)
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 Leonides Schanz  12/20-12/27/2021: IV feraheme 510mg  q 7 days x 2 doses and 2 doses of IM Vitamin b12 .  12/26/2020: WBC 4.3, Hgb 11.8, MCV 79.4, Plt 432 01/09/2022: Wbc 5.9, Hgb 12.1, MCV 77.2, Plt 544 09/28/2022: WBC 4.7, Hgb 11.2, MCV 79.2, Plt 365 12/25/2022: WBC 4.5, Hgb 11.2, MCV 79.2, Plt 454  03/26/2023: WBC 4.5, Hgb 11.2, MCV 79.8, Plt 471  Interval History:  Meredith Byrd 42 y.o. female with medical history significant for iron deficiency anemia 2/2 to gastric bipass surgery who presents for a follow up visit. The patient's last visit was on 12/25/2022. In the interim since the last visit she has had no major changes in her health.   On exam today Mrs. Clopper reports her energy levels are overall stable. She does have persistent fatigue but is able to complete her ADLs on her own. She reports her weight and appetite are overall stable. She denies nausea, vomiting or abdominal pain. Her bowel habits are unchanged without recurrent episodes of diarrhea or constipation. She continues to have bruising but denies any signs of active bleeding. he denies fevers, chills, shortness of breath, chest pain or cough. She is had no other changes in her health at this time.  A full 10 point ROS is listed below.  MEDICAL HISTORY:  Past Medical History:  Diagnosis Date    Allergy    Anemia    Anxiety    Arthritis pain 08/20/2014   Back pain    COVID-19    GERD (gastroesophageal reflux disease)    History of stress test 02/2011 ([facility])   there was no evidence of ischemia, but she only went 3 1/2 minutes on the treadmill making it very difficult to get a good accurate assessment, however   Hx of echocardiogram    The echocadiogram was essentially normal with the exception of mild mitral calcification and borderline concentric LVH, which in the setting of her hypertension at this early age is something that mean her blood pressure is well controlled.    Hypertension    Iron deficiency anemia, unspecified 07/09/2014   Migraines    Palpitations    Swelling    Vitamin B12 deficiency 03/17/2016    SURGICAL HISTORY: Past Surgical History:  Procedure Laterality Date   BREAST SURGERY     CARPAL TUNNEL RELEASE Right 09/24/2021   Procedure: RIGHT CARPAL TUNNEL RELEASE;  Surgeon: Tarry Kos, MD;  Location: [facility];  Service: Orthopedics;  Laterality: Right;   CHOLECYSTECTOMY     GASTRIC BYPASS  2008   OOPHORECTOMY Right 2010   REDUCTION MAMMAPLASTY Bilateral     SOCIAL HISTORY: Social History   Socioeconomic History   Marital status: Married    Spouse name: Damon   Number of children: 4   Years of education: Associates   Highest education level: Not on file  Occupational History   Occupation: Human resources officer  Tobacco  Use   Smoking status: Never   Smokeless tobacco: Never  Vaping Use   Vaping Use: Never used  Substance and Sexual Activity   Alcohol use: No   Drug use: No   Sexual activity: Yes  Other Topics Concern   Not on file  Social History Narrative   Right handed.   Lives at home with husband and four children.   No caffeine use.   Social Determinants of Health   Financial Resource Strain: Not on file  Food Insecurity: Not on file  Transportation Needs: Not on file  Physical Activity: Not on file  Stress: Not  on file  Social Connections: Not on file  Intimate Partner Violence: Not on file    FAMILY HISTORY: Family History  Problem Relation Age of Onset   Asthma Mother    Heart disease Mother    Diabetes Mother    Early death Mother    Hypertension Mother    Thyroid disease Mother    Anxiety disorder Mother    Hypertension Father    Heart disease Father    Breast cancer Sister 66   Breast cancer Maternal Aunt        under 56   Breast cancer Paternal Aunt    Stroke Maternal Grandmother    Heart disease Maternal Grandmother    Hypertension Maternal Grandmother    Hyperlipidemia Maternal Grandmother    Breast cancer Maternal Grandmother     ALLERGIES:  is allergic to doxycycline, fioricet [butalbital-apap-caffeine], inderal [propranolol], and nsaids.  MEDICATIONS:  Current Outpatient Medications  Medication Sig Dispense Refill   acetaminophen (TYLENOL) 500 MG tablet Take 2 tablets (1,000 mg total) by mouth every 6 (six) hours as needed. 30 tablet 0   acetaminophen-codeine (TYLENOL #3) 300-30 MG tablet Take 1 tablet by mouth 2 (two) times daily as needed for moderate pain. 30 tablet 0   albuterol (VENTOLIN HFA) 108 (90 Base) MCG/ACT inhaler Inhale 1-2 puffs into the lungs every 6 (six) hours as needed for wheezing or shortness of breath. 18 g 0   cloNIDine (CATAPRES) 0.2 MG tablet Take 0.4 mg by mouth at bedtime.     Cyanocobalamin (B-12 COMPLIANCE INJECTION) 1000 MCG/ML KIT Inject as directed.     Galcanezumab-gnlm (EMGALITY) 120 MG/ML SOSY 240 mg subQ (2 consecutive 120-mg doses) once as a loading dose, followed by 120 mg once monthly 3 mL 6   medroxyPROGESTERone (DEPO-PROVERA) 150 MG/ML injection      montelukast (SINGULAIR) 10 MG tablet Take 10 mg by mouth at bedtime.     Ubrogepant (UBRELVY) 100 MG TABS Take 1 tablet at the onset of migraine. Can repeat in 2 hours if needed. Only 2 tabs/24 hours 12 tablet 11   UNABLE TO FIND Med Name: allergy injections  3 times a week      verapamil (CALAN-SR) 120 MG CR tablet Take 1 tablet (120 mg total) by mouth at bedtime. 30 tablet 11   Vitamin D, Ergocalciferol, (DRISDOL) 1.25 MG (50000 UNIT) CAPS capsule TAKE 1 CAPSULE BY MOUTH EVERY 7 DAYS 12 capsule 0   No current facility-administered medications for this visit.    REVIEW OF SYSTEMS:   Constitutional: ( - ) fevers, ( - )  chills , ( - ) night sweats Eyes: ( - ) blurriness of vision, ( - ) double vision, ( - ) watery eyes Ears, nose, mouth, throat, and face: ( - ) mucositis, ( - ) sore throat Respiratory: ( - ) cough, ( - ) dyspnea, ( - )  wheezes Cardiovascular: ( - ) palpitation, ( - ) chest discomfort, ( - ) lower extremity swelling Gastrointestinal:  ( - ) nausea, ( - ) heartburn, ( - ) change in bowel habits Skin: ( - ) abnormal skin rashes Lymphatics: ( - ) new lymphadenopathy, ( - ) easy bruising Neurological: ( - ) numbness, ( - ) tingling, ( - ) new weaknesses Behavioral/Psych: ( - ) mood change, ( - ) new changes  All other systems were reviewed with the patient and are negative.  PHYSICAL EXAMINATION:  Vitals:   03/26/23 1334  BP: 118/88  Pulse: 67  Resp: 18  Temp: 99.9 F (37.7 C)  SpO2: 100%     Filed Weights   03/26/23 1334  Weight: 192 lb 9 oz (87.3 kg)    GENERAL: tired appearing middle aged [place] American female. alert, no distress and comfortable SKIN: skin color, texture, turgor are normal, no rashes or significant lesions EYES: conjunctiva are pink and non-injected, sclera clear LUNGS: clear to auscultation and percussion with normal breathing effort HEART: regular rate & rhythm and no murmurs and no lower extremity edema Musculoskeletal: no cyanosis of digits and no clubbing  PSYCH: alert & oriented x 3, fluent speech NEURO: no focal motor/sensory deficits  LABORATORY DATA:  I have reviewed the data as listed    Latest Ref Rng & Units 03/26/2023    1:05 PM 12/25/2022    2:38 PM 09/28/2022    2:39 PM  CBC  WBC 4.0 - 10.5  K/uL 4.5  4.5  4.7   Hemoglobin 12.0 - 15.0 g/dL 78.2  95.6  21.3   Hematocrit 36.0 - 46.0 % 36.4  35.4  35.5   Platelets 150 - 400 K/uL 471  454  365        Latest Ref Rng & Units 03/26/2023    1:05 PM 12/25/2022    2:38 PM 09/28/2022    2:39 PM  CMP  Glucose 70 - 99 mg/dL 086  88  77   BUN 6 - 20 mg/dL 9  8  11    Creatinine 0.44 - 1.00 mg/dL 5.78  4.69  6.29   Sodium 135 - 145 mmol/L 141  140  140   Potassium 3.5 - 5.1 mmol/L 3.9  3.6  3.7   Chloride 98 - 111 mmol/L 107  108  107   CO2 22 - 32 mmol/L 26  28  28    Calcium 8.9 - 10.3 mg/dL 9.7  9.1  9.4   Total Protein 6.5 - 8.1 g/dL 7.3  7.5  6.8   Total Bilirubin 0.3 - 1.2 mg/dL 0.4  0.5  0.4   Alkaline Phos 38 - 126 U/L 57  57  47   AST 15 - 41 U/L 13  12  13    ALT 0 - 44 U/L 15  14  17      RADIOGRAPHIC STUDIES: I have personally reviewed the radiological images as listed and agreed with the findings in the report. No results found.  ASSESSMENT & PLAN Tara M Dewilde 42 y.o. female with medical history significant for iron deficiency anemia 2/2 to gastric bipass surgery who presents for a follow up visit.  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  -- labs today show WBC 4.5, Hgb 11.2, MCV 79.8, Plt 471.  Iron panel shows ferritin 55, iron 56, saturation 13%, Vitamin B12 557. surgery.  --Currently receiving IM B12 injection 1000 mcg q 2 weeks. Recommend to continue. --No need for IV iron infusions at this time.  --Return to clinic in 3 months   #Fatigue, stable --Patient still has marked fatigue despite adequate repletion of iron and vitamin B12.   --normal TSH and hemoglobin A1c from 12/26/2020 --Folate levels have normalized from 03/21/2021 --H/O of vitamin D deficiency so currently takes vitamin D  supplementation.    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   No orders of the defined types were placed in this encounter.  All questions were answered. The patient knows to call the clinic with any problems, questions or concerns.  I have spent a total of 30 minutes minutes of face-to-face and non-face-to-face time, preparing to see the patient, performing a medically appropriate examination, counseling and educating the patient,  documenting clinical information in the electronic health record, and care coordination.   Georga Kaufmann PA-C Dept of Hematology and Oncology [facility] Phone: [phone]     03/26/2023 1:46 PM

## 2023-03-30 ENCOUNTER — Telehealth: Payer: Self-pay

## 2023-03-30 NOTE — Telephone Encounter (Signed)
-----   Message from Briant Cedar, PA-C sent at 03/26/2023  9:44 PM EDT ----- Please notify patient that iron and B12 levels are normal. No need for additional IV iron. Continue with B12 injections.

## 2023-03-30 NOTE — Telephone Encounter (Signed)
Pt advised with VU and agreed to plan. 

## 2023-04-02 ENCOUNTER — Other Ambulatory Visit: Payer: Self-pay | Admitting: Family Medicine

## 2023-04-02 DIAGNOSIS — Z1231 Encounter for screening mammogram for malignant neoplasm of breast: Secondary | ICD-10-CM

## 2023-04-10 ENCOUNTER — Inpatient Hospital Stay: Payer: 59

## 2023-04-10 VITALS — BP 118/68 | HR 81 | Temp 98.1°F | Resp 20

## 2023-04-10 DIAGNOSIS — D508 Other iron deficiency anemias: Secondary | ICD-10-CM

## 2023-04-10 DIAGNOSIS — E538 Deficiency of other specified B group vitamins: Secondary | ICD-10-CM

## 2023-04-10 MED ORDER — CYANOCOBALAMIN 1000 MCG/ML IJ SOLN
1000.0000 ug | Freq: Once | INTRAMUSCULAR | Status: AC
  Administered 2023-04-10: 1000 ug via INTRAMUSCULAR
  Filled 2023-04-10: qty 1

## 2023-04-16 ENCOUNTER — Ambulatory Visit (INDEPENDENT_AMBULATORY_CARE_PROVIDER_SITE_OTHER): Payer: 59 | Admitting: Orthopaedic Surgery

## 2023-04-16 ENCOUNTER — Other Ambulatory Visit (INDEPENDENT_AMBULATORY_CARE_PROVIDER_SITE_OTHER): Payer: 59

## 2023-04-16 ENCOUNTER — Encounter: Payer: Self-pay | Admitting: Orthopaedic Surgery

## 2023-04-16 DIAGNOSIS — M25511 Pain in right shoulder: Secondary | ICD-10-CM | POA: Diagnosis not present

## 2023-04-16 DIAGNOSIS — M65311 Trigger thumb, right thumb: Secondary | ICD-10-CM | POA: Diagnosis not present

## 2023-04-16 DIAGNOSIS — G8929 Other chronic pain: Secondary | ICD-10-CM | POA: Diagnosis not present

## 2023-04-16 MED ORDER — METHYLPREDNISOLONE ACETATE 40 MG/ML IJ SUSP
40.00 mg | INTRAMUSCULAR | Status: AC | PRN
Start: 2023-04-16 — End: 2023-04-16
  Administered 2023-04-16: 40 mg via INTRA_ARTICULAR

## 2023-04-16 MED ORDER — LIDOCAINE HCL 1 % IJ SOLN
0.30 mL | INTRAMUSCULAR | Status: AC | PRN
Start: 2023-04-16 — End: 2023-04-16
  Administered 2023-04-16: .3 mL

## 2023-04-16 MED ORDER — LIDOCAINE HCL 1 % IJ SOLN
3.0000 mL | INTRAMUSCULAR | Status: AC | PRN
Start: 2023-04-16 — End: 2023-04-16
  Administered 2023-04-16: 3 mL

## 2023-04-16 MED ORDER — METHYLPREDNISOLONE ACETATE 40 MG/ML IJ SUSP
13.33 mg | INTRAMUSCULAR | Status: AC | PRN
Start: 2023-04-16 — End: 2023-04-16
  Administered 2023-04-16: 13.33 mg

## 2023-04-16 MED ORDER — BUPIVACAINE HCL 0.5 % IJ SOLN
0.3300 mL | INTRAMUSCULAR | Status: AC | PRN
Start: 2023-04-16 — End: 2023-04-16
  Administered 2023-04-16: .33 mL

## 2023-04-16 MED ORDER — BUPIVACAINE HCL 0.5 % IJ SOLN
3.00 mL | INTRAMUSCULAR | Status: AC | PRN
Start: 2023-04-16 — End: 2023-04-16
  Administered 2023-04-16: 3 mL via INTRA_ARTICULAR

## 2023-04-16 NOTE — Progress Notes (Signed)
Office Visit Note   Patient: Meredith Byrd           Date of Birth: 1981/08/17           MRN: [id number] Visit Date: 04/16/2023              Requested by: Irena Reichmann, DO [address] [place],  [place] [address] PCP: Irena Reichmann, DO   Assessment & Plan: Visit Diagnoses:  1. Chronic right shoulder pain   2. Trigger thumb, right thumb     Plan: Impression is recurrent right shoulder pain and right trigger thumb.  Regards to the shoulder, she had great relief from previous cortisone injection.  We will go ahead and repeat the subacromial injection today.  We have also provided her with a gym exercise program.  In regards to the thumb, we have discussed cortisone injection and night splinting for which she is agreeable to.  Follow-up with [place] as needed.  Follow-Up Instructions: Return if symptoms worsen or fail to improve.   Orders:  Orders Placed This Encounter  Procedures   XR Shoulder Right   No orders of the defined types were placed in this encounter.     Procedures: Large Joint Inj: R subacromial bursa on 04/16/2023 5:10 PM Indications: pain Details: 22 G needle  Arthrogram: No  Medications: 3 mL lidocaine 1 %; 3 mL bupivacaine 0.5 %; 40 mg methylPREDNISolone acetate 40 MG/ML Outcome: tolerated well, no immediate complications Consent was given by the patient. Patient was prepped and draped in the usual sterile fashion.    Hand/UE Inj: R thumb A1 for trigger finger on 04/16/2023 5:10 PM Indications: pain Details: 25 G needle Medications: 0.3 mL lidocaine 1 %; 0.33 mL bupivacaine 0.5 %; 13.33 mg methylPREDNISolone acetate 40 MG/ML Outcome: tolerated well, no immediate complications Consent was given by the patient. Patient was prepped and draped in the usual sterile fashion.       Clinical Data: No additional findings.   Subjective: Chief Complaint  Patient presents with   Right Shoulder - Pain   Right Hand - Pain    HPI patient  is a pleasant 42 year old female who comes in today with recurrent right shoulder pain.  She was seen in our office little over a year ago her right shoulder subacromial space was injected with cortisone.  She had great relief until about 2 to 3 months ago.  She denies any new injury or change in activity.  The pain she has is primarily to the proximal deltoid.  Certain activities such as washing her hair seems to aggravate her symptoms.  She has been taking Tylenol without significant relief.  Other issue she brings up today is right thumb pain and triggering.  This is been ongoing for years but has worsened over the past 4 months.  She is right-handed and does a lot of typing.  She is not a diabetic.  She notes that her symptoms are worse first thing in the morning but she does have pain and swelling throughout the day.  No previous trigger thumb injection.  No history of any other trigger fingers.  Review of Systems as detailed in HPI.  All others reviewed and are negative.   Objective: Vital Signs: There were no vitals taken for this visit.  Physical Exam well-developed well-nourished female no acute distress.  Alert and oriented x 3.  Ortho Exam right shoulder exam reveals near full forward flexion although this is with pain past 90 degrees.  External rotation to about 70 degrees.  She can internally rotate to her back pocket.  She does have a positive empty can test.  Negative speeds and negative O'Brien's.  She has full strength throughout.  She is neurovascular intact distally.  Right thumb exam reveals pain as well as palpable nodule to the A1 pulley.  No reproducible triggering.  Specialty Comments:  No specialty comments available.  Imaging: XR Shoulder Right  Result Date: 04/16/2023 X-rays demonstrate mild degenerative changes to the [facility] joint.    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   On Depo-Provera for contraception 05/31/2019   Chronic insomnia 05/17/2019   Lumbar facet arthropathy 12/14/2018   Dysmenorrhea, unspecified 05/16/2018   Backache 01/26/2018   Knee pain, left 11/17/2017   Generalized anxiety disorder 10/04/2017   Morning headache 05/04/2017   Daytime somnolence 05/04/2017   Class 3 obesity with body mass index (BMI) of 45.0 to 49.9 in adult 03/28/2017   Chronic low back pain 01/19/2017   Vitamin D deficiency 01/18/2017   Vitamin B12 deficiency 03/17/2016   Beta thalassemia trait 03/25/2015   Intractable migraine without aura and without status migrainosus 11/23/2014   Elevated sed rate 08/20/2014   Arthritis pain 08/20/2014   Iron deficiency anemia 07/09/2014   Symptomatic anemia 06/05/2014   Chest pain 05/01/2014   GERD (gastroesophageal reflux disease) 03/22/2014   S/P gastric bypass 03/22/2014   Insomnia 03/22/2014   Migraine 03/22/2014   Palpitations 03/22/2014   Past Medical History:  Diagnosis Date   Allergy    Anemia    Anxiety    Arthritis pain 08/20/2014   Back pain    COVID-19    GERD (gastroesophageal reflux disease)    History of stress test 02/2011 ([facility])   there was no evidence of ischemia, but she only went 3 1/2 minutes on the treadmill making it very difficult to get a good accurate assessment, however   Hx of echocardiogram    The echocadiogram was essentially normal with the exception of mild mitral calcification and borderline concentric LVH, which in the setting of her hypertension at this early age is something that mean her blood pressure is well controlled.    Hypertension    Iron deficiency anemia, unspecified 07/09/2014   Migraines    Palpitations    Swelling    Vitamin B12 deficiency 03/17/2016     Family History  Problem Relation Age of Onset   Asthma Mother    Heart disease Mother    Diabetes Mother    Early death Mother    Hypertension Mother    Thyroid disease Mother    Anxiety disorder Mother    Hypertension Father    Heart disease Father    Breast cancer Sister 70   Breast cancer Maternal Aunt        under 70   Breast cancer Paternal Aunt    Stroke Maternal Grandmother    Heart disease Maternal Grandmother    Hypertension Maternal Grandmother    Hyperlipidemia Maternal Grandmother    Breast cancer Maternal Grandmother     Past Surgical History:  Procedure Laterality Date   BREAST SURGERY     CARPAL TUNNEL RELEASE  Right 09/24/2021   Procedure: RIGHT CARPAL TUNNEL RELEASE;  Surgeon: Tarry Kos, MD;  Location: [facility];  Service: Orthopedics;  Laterality: Right;   CHOLECYSTECTOMY     GASTRIC BYPASS  2008   OOPHORECTOMY Right 2010   REDUCTION MAMMAPLASTY Bilateral    Social History   Occupational History   Occupation: Human resources officer  Tobacco Use   Smoking status: Never   Smokeless tobacco: Never  Vaping Use   Vaping Use: Never used  Substance and Sexual Activity   Alcohol use: No   Drug use: No   Sexual activity: Yes

## 2023-04-24 ENCOUNTER — Inpatient Hospital Stay: Payer: 59 | Attending: Hematology and Oncology

## 2023-04-24 VITALS — BP 116/71 | HR 85 | Temp 99.1°F | Resp 20

## 2023-04-24 DIAGNOSIS — Z79899 Other long term (current) drug therapy: Secondary | ICD-10-CM | POA: Diagnosis not present

## 2023-04-24 DIAGNOSIS — E538 Deficiency of other specified B group vitamins: Secondary | ICD-10-CM | POA: Diagnosis present

## 2023-04-24 DIAGNOSIS — D508 Other iron deficiency anemias: Secondary | ICD-10-CM | POA: Insufficient documentation

## 2023-04-24 MED ORDER — CYANOCOBALAMIN 1000 MCG/ML IJ SOLN
1000.0000 ug | Freq: Once | INTRAMUSCULAR | Status: AC
  Administered 2023-04-24: 1000 ug via INTRAMUSCULAR
  Filled 2023-04-24: qty 1

## 2023-05-08 ENCOUNTER — Inpatient Hospital Stay: Payer: 59

## 2023-05-18 ENCOUNTER — Ambulatory Visit: Payer: 59 | Admitting: Physician Assistant

## 2023-05-22 ENCOUNTER — Inpatient Hospital Stay: Payer: 59 | Attending: Hematology and Oncology

## 2023-05-22 VITALS — BP 135/92 | HR 79 | Temp 98.4°F

## 2023-05-22 DIAGNOSIS — D508 Other iron deficiency anemias: Secondary | ICD-10-CM

## 2023-05-22 DIAGNOSIS — Z9884 Bariatric surgery status: Secondary | ICD-10-CM | POA: Insufficient documentation

## 2023-05-22 DIAGNOSIS — E538 Deficiency of other specified B group vitamins: Secondary | ICD-10-CM | POA: Diagnosis present

## 2023-05-22 MED ORDER — CYANOCOBALAMIN 1000 MCG/ML IJ SOLN
1000.0000 ug | Freq: Once | INTRAMUSCULAR | Status: AC
  Administered 2023-05-22: 1000 ug via INTRAMUSCULAR

## 2023-06-05 ENCOUNTER — Inpatient Hospital Stay: Payer: 59

## 2023-06-05 ENCOUNTER — Other Ambulatory Visit: Payer: Self-pay

## 2023-06-05 VITALS — BP 116/73 | HR 65 | Temp 98.2°F | Resp 13

## 2023-06-05 DIAGNOSIS — E538 Deficiency of other specified B group vitamins: Secondary | ICD-10-CM | POA: Diagnosis not present

## 2023-06-05 DIAGNOSIS — D508 Other iron deficiency anemias: Secondary | ICD-10-CM

## 2023-06-05 MED ORDER — CYANOCOBALAMIN 1000 MCG/ML IJ SOLN
1000.0000 ug | Freq: Once | INTRAMUSCULAR | Status: AC
  Administered 2023-06-05: 1000 ug via INTRAMUSCULAR

## 2023-06-19 ENCOUNTER — Inpatient Hospital Stay: Payer: 59 | Attending: Hematology and Oncology

## 2023-06-19 DIAGNOSIS — D563 Thalassemia minor: Secondary | ICD-10-CM | POA: Insufficient documentation

## 2023-06-19 DIAGNOSIS — Z79899 Other long term (current) drug therapy: Secondary | ICD-10-CM | POA: Insufficient documentation

## 2023-06-19 DIAGNOSIS — Z9884 Bariatric surgery status: Secondary | ICD-10-CM | POA: Insufficient documentation

## 2023-06-19 DIAGNOSIS — D509 Iron deficiency anemia, unspecified: Secondary | ICD-10-CM | POA: Insufficient documentation

## 2023-06-19 DIAGNOSIS — E538 Deficiency of other specified B group vitamins: Secondary | ICD-10-CM | POA: Insufficient documentation

## 2023-07-01 ENCOUNTER — Other Ambulatory Visit: Payer: Self-pay | Admitting: Hematology and Oncology

## 2023-07-01 ENCOUNTER — Inpatient Hospital Stay (HOSPITAL_BASED_OUTPATIENT_CLINIC_OR_DEPARTMENT_OTHER): Payer: 59 | Admitting: Hematology and Oncology

## 2023-07-01 ENCOUNTER — Inpatient Hospital Stay: Payer: 59

## 2023-07-01 VITALS — BP 115/71 | HR 61 | Temp 97.4°F | Resp 13 | Wt 194.5 lb

## 2023-07-01 DIAGNOSIS — E538 Deficiency of other specified B group vitamins: Secondary | ICD-10-CM | POA: Diagnosis not present

## 2023-07-01 DIAGNOSIS — D508 Other iron deficiency anemias: Secondary | ICD-10-CM

## 2023-07-01 DIAGNOSIS — D509 Iron deficiency anemia, unspecified: Secondary | ICD-10-CM | POA: Diagnosis present

## 2023-07-01 DIAGNOSIS — Z9884 Bariatric surgery status: Secondary | ICD-10-CM | POA: Diagnosis not present

## 2023-07-01 DIAGNOSIS — D563 Thalassemia minor: Secondary | ICD-10-CM | POA: Diagnosis not present

## 2023-07-01 DIAGNOSIS — Z79899 Other long term (current) drug therapy: Secondary | ICD-10-CM | POA: Diagnosis not present

## 2023-07-01 LAB — IRON AND IRON BINDING CAPACITY (CC-WL,HP ONLY)
Iron: 63 ug/dL (ref 28–170)
Saturation Ratios: 15 % (ref 10.4–31.8)
TIBC: 423 ug/dL (ref 250–450)
UIBC: 360 ug/dL (ref 148–442)

## 2023-07-01 LAB — CBC WITH DIFFERENTIAL (CANCER CENTER ONLY)
Abs Immature Granulocytes: 0.01 10*3/uL (ref 0.00–0.07)
Basophils Absolute: 0 10*3/uL (ref 0.0–0.1)
Basophils Relative: 1 %
Eosinophils Absolute: 0.1 10*3/uL (ref 0.0–0.5)
Eosinophils Relative: 1 %
HCT: 34.8 % — ABNORMAL LOW (ref 36.0–46.0)
Hemoglobin: 10.8 g/dL — ABNORMAL LOW (ref 12.0–15.0)
Immature Granulocytes: 0 %
Lymphocytes Relative: 33 %
Lymphs Abs: 1.4 10*3/uL (ref 0.7–4.0)
MCH: 25 pg — ABNORMAL LOW (ref 26.0–34.0)
MCHC: 31 g/dL (ref 30.0–36.0)
MCV: 80.6 fL (ref 80.0–100.0)
Monocytes Absolute: 0.2 10*3/uL (ref 0.1–1.0)
Monocytes Relative: 5 %
Neutro Abs: 2.7 10*3/uL (ref 1.7–7.7)
Neutrophils Relative %: 60 %
Platelet Count: 388 10*3/uL (ref 150–400)
RBC: 4.32 MIL/uL (ref 3.87–5.11)
RDW: 15.5 % (ref 11.5–15.5)
WBC Count: 4.4 10*3/uL (ref 4.0–10.5)
nRBC: 0 % (ref 0.0–0.2)

## 2023-07-01 LAB — CMP (CANCER CENTER ONLY)
ALT: 26 U/L (ref 0–44)
AST: 22 U/L (ref 15–41)
Albumin: 3.9 g/dL (ref 3.5–5.0)
Alkaline Phosphatase: 57 U/L (ref 38–126)
Anion gap: 4 — ABNORMAL LOW (ref 5–15)
BUN: 9 mg/dL (ref 6–20)
CO2: 28 mmol/L (ref 22–32)
Calcium: 9 mg/dL (ref 8.9–10.3)
Chloride: 109 mmol/L (ref 98–111)
Creatinine: 0.71 mg/dL (ref 0.44–1.00)
GFR, Estimated: >60 mL/min (ref >60)
Glucose, Bld: 84 mg/dL (ref 70–99)
Potassium: 4 mmol/L (ref 3.5–5.1)
Sodium: 141 mmol/L (ref 135–145)
Total Bilirubin: 0.4 mg/dL (ref 0.3–1.2)
Total Protein: 7.1 g/dL (ref 6.5–8.1)

## 2023-07-01 LAB — RETIC PANEL
Immature Retic Fract: 13.5 % (ref 2.3–15.9)
RBC.: 4.28 MIL/uL (ref 3.87–5.11)
Retic Count, Absolute: 51.8 10*3/uL (ref 19.0–186.0)
Retic Ct Pct: 1.2 % (ref 0.4–3.1)
Reticulocyte Hemoglobin: 25.5 pg — ABNORMAL LOW (ref >27.9)

## 2023-07-01 LAB — FERRITIN: Ferritin: 26 ng/mL (ref 11–307)

## 2023-07-01 LAB — VITAMIN B12: Vitamin B-12: 676 pg/mL (ref 180–914)

## 2023-07-01 MED ORDER — CYANOCOBALAMIN 1000 MCG/ML IJ SOLN
1000.0000 ug | Freq: Once | INTRAMUSCULAR | Status: AC
  Administered 2023-07-01: 1000 ug via INTRAMUSCULAR
  Filled 2023-07-01: qty 1

## 2023-07-01 NOTE — Progress Notes (Signed)
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 Leonides Schanz  12/20-12/27/2021: IV feraheme 510mg  q 7 days x 2 doses and 2 doses of IM Vitamin b12 .  12/26/2020: WBC 4.3, Hgb 11.8, MCV 79.4, Plt 432 01/09/2022: Wbc 5.9, Hgb 12.1, MCV 77.2, Plt 544 09/28/2022: WBC 4.7, Hgb 11.2, MCV 79.2, Plt 365 12/25/2022: WBC 4.5, Hgb 11.2, MCV 79.2, Plt 454  03/26/2023: WBC 4.5, Hgb 11.2, MCV 79.8, Plt 471  Interval History:  Meredith Byrd 42 y.o. female with medical history significant for iron deficiency anemia 2/2 to gastric bipass surgery who presents for a follow up visit. The patient's last visit was on 03/26/2023. In the interim since the last visit she has had no major changes in her health.   On exam today Meredith Byrd reports her energy levels have been okay in the interim since her last visit.  She reports that she has not been taking any iron pills.  She reports that she has not had any bleeding but does bruise easily.  She is doing her best to try to eat green leafy vegetables but does not eat red meat.  She prefers chicken and fish.  She has seen no blood in her stool.  She denies any lightheadedness, dizziness, shortness of breath.  He has been working with an exercise program.  She notes that she has to receive IV iron therapy she would prefer to have it administered at the [facility] rather than at [facility].. He denies fevers, chills, shortness of breath, chest pain or cough. She is had no other changes in her health at this time.  A full 10 point ROS is listed below.  MEDICAL HISTORY:  Past Medical History:  Diagnosis Date   Allergy    Anemia    Anxiety    Arthritis pain 08/20/2014   Back pain    COVID-19    GERD (gastroesophageal reflux disease)    History of stress test 02/2011 ([facility])   there was no evidence of ischemia, but she only went 3 1/2 minutes on the treadmill making it very difficult to get a good accurate assessment, however   Hx of echocardiogram    The echocadiogram was essentially normal with the exception of mild mitral calcification and borderline concentric LVH, which in the setting of her hypertension at this early age is something that mean her blood pressure is well controlled.    Hypertension    Iron deficiency anemia, unspecified 07/09/2014   Migraines    Palpitations    Swelling    Vitamin B12 deficiency 03/17/2016    SURGICAL HISTORY: Past Surgical History:  Procedure Laterality Date   BREAST SURGERY     CARPAL TUNNEL RELEASE Right 09/24/2021   Procedure: RIGHT CARPAL TUNNEL RELEASE;  Surgeon: Tarry Kos, MD;  Location: [facility];  Service: Orthopedics;  Laterality: Right;   CHOLECYSTECTOMY     GASTRIC BYPASS  2008   OOPHORECTOMY  Right 2010   REDUCTION MAMMAPLASTY Bilateral     SOCIAL HISTORY: Social History   Socioeconomic History   Marital status: Married    Spouse name: Damon   Number of children: 4   Years of education: Associates   Highest education level: Not on file  Occupational History   Occupation: Insurance Claims  Tobacco Use   Smoking status: Never   Smokeless tobacco: Never  Vaping Use   Vaping status: Never Used  Substance and Sexual Activity   Alcohol use: No   Drug use: No   Sexual activity: Yes  Other Topics Concern   Not on file  Social History Narrative   Right handed.   Lives at home  with husband and four children.   No caffeine use.   Social Determinants of Health   Financial Resource Strain: Not on file  Food Insecurity: Not on file  Transportation Needs: Not on file  Physical Activity: Not on file  Stress: Not on file  Social Connections: Not on file  Intimate Partner Violence: Not on file    FAMILY HISTORY: Family History  Problem Relation Age of Onset   Asthma Mother    Heart disease Mother    Diabetes Mother    Early death Mother    Hypertension Mother    Thyroid disease Mother    Anxiety disorder Mother    Hypertension Father    Heart disease Father    Breast cancer Sister 49   Breast cancer Maternal Aunt        under 81   Breast cancer Paternal Aunt    Stroke Maternal Grandmother    Heart disease Maternal Grandmother    Hypertension Maternal Grandmother    Hyperlipidemia Maternal Grandmother    Breast cancer Maternal Grandmother     ALLERGIES:  is allergic to doxycycline, fioricet [butalbital-apap-caffeine], inderal [propranolol], and nsaids.  MEDICATIONS:  Current Outpatient Medications  Medication Sig Dispense Refill   acetaminophen (TYLENOL) 500 MG tablet Take 2 tablets (1,000 mg total) by mouth every 6 (six) hours as needed. 30 tablet 0   acetaminophen-codeine (TYLENOL #3) 300-30 MG tablet Take 1 tablet by mouth 2 (two) times daily as needed for moderate pain. 30 tablet 0   albuterol (VENTOLIN HFA) 108 (90 Base) MCG/ACT inhaler Inhale 1-2 puffs into the lungs every 6 (six) hours as needed for wheezing or shortness of breath. 18 g 0   cloNIDine (CATAPRES) 0.2 MG tablet Take 0.4 mg by mouth at bedtime.     Cyanocobalamin (B-12 COMPLIANCE INJECTION) 1000 MCG/ML KIT Inject as directed.     Galcanezumab-gnlm (EMGALITY) 120 MG/ML SOSY 240 mg subQ (2 consecutive 120-mg doses) once as a loading dose, followed by 120 mg once monthly 3 mL 6   medroxyPROGESTERone (DEPO-PROVERA) 150 MG/ML injection      montelukast (SINGULAIR) 10 MG tablet Take 10  mg by mouth at bedtime.     Ubrogepant (UBRELVY) 100 MG TABS Take 1 tablet at the onset of migraine. Can repeat in 2 hours if needed. Only 2 tabs/24 hours 12 tablet 11   UNABLE TO FIND Med Name: allergy injections  3 times a week     verapamil (CALAN-SR) 120 MG CR tablet Take 1 tablet (120 mg total) by mouth at bedtime. 30 tablet 11   Vitamin D, Ergocalciferol, (DRISDOL) 1.25 MG (50000 UNIT) CAPS capsule TAKE 1 CAPSULE BY MOUTH EVERY 7 DAYS 12 capsule 0   No current facility-administered medications for this visit.    REVIEW OF  SYSTEMS:   Constitutional: ( - ) fevers, ( - )  chills , ( - ) night sweats Eyes: ( - ) blurriness of vision, ( - ) double vision, ( - ) watery eyes Ears, nose, mouth, throat, and face: ( - ) mucositis, ( - ) sore throat Respiratory: ( - ) cough, ( - ) dyspnea, ( - ) wheezes Cardiovascular: ( - ) palpitation, ( - ) chest discomfort, ( - ) lower extremity swelling Gastrointestinal:  ( - ) nausea, ( - ) heartburn, ( - ) change in bowel habits Skin: ( - ) abnormal skin rashes Lymphatics: ( - ) new lymphadenopathy, ( - ) easy bruising Neurological: ( - ) numbness, ( - ) tingling, ( - ) new weaknesses Behavioral/Psych: ( - ) mood change, ( - ) new changes  All other systems were reviewed with the patient and are negative.  PHYSICAL EXAMINATION:  Vitals:   07/01/23 1034 07/01/23 1046  BP: 115/71 115/71  Pulse: 61 61  Resp: 13 13  Temp: (!) 97.4 F (36.3 C) (!) 97.4 F (36.3 C)  SpO2: 100% 100%      Filed Weights   07/01/23 1034 07/01/23 1046  Weight: 194 lb 8 oz (88.2 kg) 194 lb 8 oz (88.2 kg)     GENERAL: tired appearing middle aged [place] American female. alert, no distress and comfortable SKIN: skin color, texture, turgor are normal, no rashes or significant lesions EYES: conjunctiva are pink and non-injected, sclera clear LUNGS: clear to auscultation and percussion with normal breathing effort HEART: regular rate & rhythm and no murmurs and no  lower extremity edema Musculoskeletal: no cyanosis of digits and no clubbing  PSYCH: alert & oriented x 3, fluent speech NEURO: no focal motor/sensory deficits  LABORATORY DATA:  I have reviewed the data as listed    Latest Ref Rng & Units 07/01/2023    9:29 AM 03/26/2023    1:05 PM 12/25/2022    2:38 PM  CBC  WBC 4.0 - 10.5 K/uL 4.4  4.5  4.5   Hemoglobin 12.0 - 15.0 g/dL 57.8  46.9  62.9   Hematocrit 36.0 - 46.0 % 34.8  36.4  35.4   Platelets 150 - 400 K/uL 388  471  454        Latest Ref Rng & Units 07/01/2023    9:29 AM 03/26/2023    1:05 PM 12/25/2022    2:38 PM  CMP  Glucose 70 - 99 mg/dL 84  528  88   BUN 6 - 20 mg/dL 9  9  8    Creatinine 0.44 - 1.00 mg/dL 4.13  2.44  0.10   Sodium 135 - 145 mmol/L 141  141  140   Potassium 3.5 - 5.1 mmol/L 4.0  3.9  3.6   Chloride 98 - 111 mmol/L 109  107  108   CO2 22 - 32 mmol/L 28  26  28    Calcium 8.9 - 10.3 mg/dL 9.0  9.7  9.1   Total Protein 6.5 - 8.1 g/dL 7.1  7.3  7.5   Total Bilirubin 0.3 - 1.2 mg/dL 0.4  0.4  0.5   Alkaline Phos 38 - 126 U/L 57  57  57   AST 15 - 41 U/L 22  13  12    ALT 0 - 44 U/L 26  15  14      RADIOGRAPHIC STUDIES: No results found.  ASSESSMENT & PLAN Krysti M Reede 42 y.o. female with medical history significant  for iron deficiency anemia 2/2 to gastric bipass surgery who presents for a follow up visit.  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  -- labs today show WBC 4.4, hemoglobin 10.8, MCV 80.6, and platelets of 388.  Vitamin B12 is 676 and ferritin is 26 with iron sat of 15% --Currently receiving IM B12 injection 1000 mcg q 2 weeks. Recommend to continue. -- Due to low ferritin and iron saturation with inability to tolerate p.o. iron therapy will plan to proceed with IV  iron. --Return to clinic in 3 months with interval vitamin B12 injections every 2 weeks.  #Fatigue, stable --Patient still has marked fatigue despite adequate repletion of iron and vitamin B12.   --normal TSH and hemoglobin A1c from 12/26/2020 --Folate levels have normalized from 03/21/2021 --H/O of vitamin D deficiency so currently takes vitamin D supplementation.    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   No orders of the defined types were placed in this encounter.  All questions were answered. The patient knows to call the clinic with any problems, questions or concerns.  I have spent a total of 30 minutes minutes of face-to-face and non-face-to-face time, preparing to see the patient, performing a medically appropriate examination, counseling and educating the patient,  documenting clinical information in the electronic health record, and care coordination.   Ulysees Barns, MD Department of Hematology/Oncology [facility] Phone: [phone] Pager: [phone] Email: [email or web address]   07/04/2023 6:24 PM

## 2023-07-04 ENCOUNTER — Encounter: Payer: Self-pay | Admitting: Hematology and Oncology

## 2023-07-05 ENCOUNTER — Telehealth: Payer: Self-pay | Admitting: Hematology and Oncology

## 2023-07-05 LAB — METHYLMALONIC ACID, SERUM: Methylmalonic Acid, Quantitative: 101 nmol/L (ref 0–378)

## 2023-07-09 ENCOUNTER — Ambulatory Visit: Payer: 59

## 2023-07-09 ENCOUNTER — Ambulatory Visit
Admission: RE | Admit: 2023-07-09 | Discharge: 2023-07-09 | Disposition: A | Discharge status: Home / Self Care | Payer: 59 | Source: Ambulatory Visit | Attending: Family Medicine | Admitting: Family Medicine

## 2023-07-09 DIAGNOSIS — Z1231 Encounter for screening mammogram for malignant neoplasm of breast: Secondary | ICD-10-CM

## 2023-07-09 MED FILL — Ferumoxytol Inj 510 MG/17ML (30 MG/ML) (Elemental Fe): INTRAVENOUS | Qty: 17 | Status: AC

## 2023-07-10 ENCOUNTER — Inpatient Hospital Stay: Payer: 59

## 2023-07-10 VITALS — BP 118/52 | HR 60 | Temp 98.7°F | Resp 17

## 2023-07-10 DIAGNOSIS — D509 Iron deficiency anemia, unspecified: Secondary | ICD-10-CM | POA: Diagnosis not present

## 2023-07-10 DIAGNOSIS — E538 Deficiency of other specified B group vitamins: Secondary | ICD-10-CM

## 2023-07-10 DIAGNOSIS — D508 Other iron deficiency anemias: Secondary | ICD-10-CM

## 2023-07-10 MED ORDER — SODIUM CHLORIDE 0.9 % IV SOLN
510.0000 mg | Freq: Once | INTRAVENOUS | Status: AC
  Administered 2023-07-10: 510 mg via INTRAVENOUS
  Filled 2023-07-10: qty 510

## 2023-07-10 NOTE — Progress Notes (Signed)
Tolerated infusion well, observed for 30 minutes. VSS, discharged alert and ambulatory.

## 2023-07-16 ENCOUNTER — Encounter: Payer: Self-pay | Admitting: Hematology and Oncology

## 2023-07-16 MED FILL — Ferumoxytol Inj 510 MG/17ML (30 MG/ML) (Elemental Fe): INTRAVENOUS | Qty: 17 | Status: AC

## 2023-07-16 NOTE — Addendum Note (Signed)
Addended by: Domenic Schwab on: 07/16/2023 11:23 AM   Modules accepted: Orders

## 2023-07-17 ENCOUNTER — Inpatient Hospital Stay: Payer: 59

## 2023-07-17 ENCOUNTER — Ambulatory Visit: Payer: 59

## 2023-07-17 ENCOUNTER — Inpatient Hospital Stay: Payer: 59 | Attending: Hematology and Oncology

## 2023-07-17 VITALS — BP 115/74 | HR 61 | Temp 97.7°F | Resp 16

## 2023-07-17 DIAGNOSIS — E538 Deficiency of other specified B group vitamins: Secondary | ICD-10-CM | POA: Insufficient documentation

## 2023-07-17 DIAGNOSIS — D509 Iron deficiency anemia, unspecified: Secondary | ICD-10-CM | POA: Insufficient documentation

## 2023-07-17 DIAGNOSIS — D508 Other iron deficiency anemias: Secondary | ICD-10-CM

## 2023-07-17 MED ORDER — SODIUM CHLORIDE 0.9 % IV SOLN: Freq: Once | INTRAVENOUS | Status: AC

## 2023-07-17 MED ORDER — CYANOCOBALAMIN 1000 MCG/ML IJ SOLN
1000.0000 ug | Freq: Once | INTRAMUSCULAR | Status: AC
  Administered 2023-07-17: 1000 ug via INTRAMUSCULAR
  Filled 2023-07-17: qty 1

## 2023-07-17 MED ORDER — SODIUM CHLORIDE 0.9 % IV SOLN
510.0000 mg | Freq: Once | INTRAVENOUS | Status: AC
  Administered 2023-07-17: 510 mg via INTRAVENOUS
  Filled 2023-07-17: qty 510

## 2023-07-17 NOTE — Patient Instructions (Signed)
 Ferumoxytol Injection What is this medication? FERUMOXYTOL (FER ue MOX i tol) treats low levels of iron in your body (iron deficiency anemia). Iron is a mineral that plays an important role in making red blood cells, which carry oxygen from your lungs to the rest of your body. This medicine may be used for other purposes; ask your health care provider or pharmacist if you have questions. COMMON BRAND NAME(S): Feraheme What should I tell my care team before I take this medication? They need to know if you have any of these conditions: Anemia not caused by low iron levels High levels of iron in the blood Magnetic resonance imaging (MRI) test scheduled An unusual or allergic reaction to iron, other medications, foods, dyes, or preservatives Pregnant or trying to get pregnant Breastfeeding How should I use this medication? This medication is injected into a vein. It is given by your care team in a hospital or clinic setting. Talk to your care team the use of this medication in children. Special care may be needed. Overdosage: If you think you have taken too much of this medicine contact a poison control center or emergency room at once. NOTE: This medicine is only for you. Do not share this medicine with others. What if I miss a dose? It is important not to miss your dose. Call your care team if you are unable to keep an appointment. What may interact with this medication? Other iron products This list may not describe all possible interactions. Give your health care provider a list of all the medicines, herbs, non-prescription drugs, or dietary supplements you use. Also tell them if you smoke, drink alcohol, or use illegal drugs. Some items may interact with your medicine. What should I watch for while using this medication? Visit your care team regularly. Tell your care team if your symptoms do not start to get better or if they get worse. You may need blood work done while you are taking this  medication. You may need to follow a special diet. Talk to your care team. Foods that contain iron include: whole grains/cereals, dried fruits, beans, or peas, leafy green vegetables, and organ meats (liver, kidney). What side effects may I notice from receiving this medication? Side effects that you should report to your care team as soon as possible: Allergic reactions--skin rash, itching, hives, swelling of the face, lips, tongue, or throat Low blood pressure--dizziness, feeling faint or lightheaded, blurry vision Shortness of breath Side effects that usually do not require medical attention (report to your care team if they continue or are bothersome): Flushing Headache Joint pain Muscle pain Nausea Pain, redness, or irritation at injection site This list may not describe all possible side effects. Call your doctor for medical advice about side effects. You may report side effects to FDA at 1-800-FDA-1088. Where should I keep my medication? This medication is given in a hospital or clinic. It will not be stored at home. NOTE: This sheet is a summary. It may not cover all possible information. If you have questions about this medicine, talk to your doctor, pharmacist, or health care provider.  2024 Elsevier/Gold Standard (2023-03-05 00:00:00)

## 2023-07-17 NOTE — Progress Notes (Signed)
Pt observed for 30 minutes post Feraheme infusion. Pt tolerated Tx well w/out incident. VSS at discharge.  Ambulatory to lobby.

## 2023-07-20 ENCOUNTER — Ambulatory Visit (INDEPENDENT_AMBULATORY_CARE_PROVIDER_SITE_OTHER): Payer: 59 | Admitting: Orthopaedic Surgery

## 2023-07-20 ENCOUNTER — Other Ambulatory Visit (INDEPENDENT_AMBULATORY_CARE_PROVIDER_SITE_OTHER): Payer: 59

## 2023-07-20 DIAGNOSIS — M25561 Pain in right knee: Secondary | ICD-10-CM

## 2023-07-20 MED ORDER — METHYLPREDNISOLONE ACETATE 40 MG/ML IJ SUSP
40.0000 mg | INTRAMUSCULAR | Status: AC | PRN
Start: 2023-07-20 — End: 2023-07-20
  Administered 2023-07-20: 40 mg via INTRA_ARTICULAR

## 2023-07-20 MED ORDER — BUPIVACAINE HCL 0.5 % IJ SOLN
2.0000 mL | INTRAMUSCULAR | Status: AC | PRN
Start: 2023-07-20 — End: 2023-07-20
  Administered 2023-07-20: 2 mL via INTRA_ARTICULAR

## 2023-07-20 MED ORDER — LIDOCAINE HCL 1 % IJ SOLN
2.0000 mL | INTRAMUSCULAR | Status: AC | PRN
Start: 2023-07-20 — End: 2023-07-20
  Administered 2023-07-20: 2 mL

## 2023-07-20 NOTE — Progress Notes (Signed)
Office Visit Note   Patient: Meredith Byrd           Date of Birth: Mar 05, 1981           MRN: [id number] Visit Date: 07/20/2023              Requested by: Irena Reichmann, DO [address] [place],  [place] [address] PCP: Irena Reichmann, DO   Assessment & Plan: Visit Diagnoses:  1. Right knee pain, unspecified chronicity     Plan: Impression is 42 year old female with right knee osteoarthritis.  Cortisone injection repeated today.  She tolerates well.  Will see her back as needed.  Follow-Up Instructions: No follow-ups on file.   Orders:  Orders Placed This Encounter  Procedures  . Large Joint Inj: R knee  . XR KNEE 3 VIEW RIGHT   No orders of the defined types were placed in this encounter.     Procedures: Large Joint Inj: R knee on 07/20/2023 4:35 PM Indications: pain Details: 22 G needle  Arthrogram: No  Medications: 40 mg methylPREDNISolone acetate 40 MG/ML; 2 mL lidocaine 1 %; 2 mL bupivacaine 0.5 % Consent was given by the patient. Patient was prepped and draped in the usual sterile fashion.      Clinical Data: No additional findings.   Subjective: Chief Complaint  Patient presents with  . Right Knee - Pain    HPI Meredith Byrd returns today for recurrent right knee pain.  She is requesting cortisone injection.  Her last one was over a year ago. Review of Systems  Constitutional: Negative.   HENT: Negative.    Eyes: Negative.   Respiratory: Negative.    Cardiovascular: Negative.   Endocrine: Negative.   Musculoskeletal: Negative.   Neurological: Negative.   Hematological: Negative.   Psychiatric/Behavioral: Negative.    All other systems reviewed and are negative.    Objective: Vital Signs: There were no vitals taken for this visit.  Physical Exam Vitals and nursing note reviewed.  Constitutional:      Appearance: She is well-developed.  HENT:     Head: Normocephalic and atraumatic.  Pulmonary:     Effort: Pulmonary  effort is normal.  Abdominal:     Palpations: Abdomen is soft.  Musculoskeletal:     Cervical back: Neck supple.  Skin:    General: Skin is warm.     Capillary Refill: Capillary refill takes less than 2 seconds.  Neurological:     Mental Status: She is alert and oriented to person, place, and time.  Psychiatric:        Behavior: Behavior normal.        Thought Content: Thought content normal.        Judgment: Judgment normal.    Ortho Exam Exam of the right knee is unchanged. Specialty Comments:  No specialty comments available.  Imaging: XR KNEE 3 VIEW RIGHT  Result Date: 07/20/2023 X-rays of the right knee showed no significant progression of arthritis.    PMFS History: Patient Active Problem List   Diagnosis Date Noted  . Autism 09/01/2022  . Primary osteoarthritis of left knee 03/31/2022  . Primary osteoarthritis of right knee 03/31/2022  . Chronic pain of right knee 06/06/2021  . Right carpal tunnel syndrome 01/23/2021  . No-show for appointment 11/25/2020  . 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 On Depo-Provera for contraception 05/31/2019  . Chronic insomnia 05/17/2019  . Lumbar facet arthropathy 12/14/2018  . Dysmenorrhea, unspecified 05/16/2018  . Backache 01/26/2018  .  Knee pain, left 11/17/2017  . Generalized anxiety disorder 10/04/2017  . Morning headache 05/04/2017  . Daytime somnolence 05/04/2017  . Class 3 obesity with body mass index (BMI) of 45.0 to 49.9 in adult 03/28/2017  . Chronic low back pain 01/19/2017  . Vitamin D deficiency 01/18/2017  . Vitamin B12 deficiency 03/17/2016  . Beta thalassemia trait 03/25/2015  . Intractable migraine without aura and without status migrainosus 11/23/2014  . Elevated sed rate 08/20/2014  . Arthritis pain 08/20/2014  . Iron deficiency anemia 07/09/2014  . Symptomatic anemia 06/05/2014  . Chest pain 05/01/2014  . GERD (gastroesophageal reflux disease) 03/22/2014  . S/P gastric bypass 03/22/2014   . Insomnia 03/22/2014  . Migraine 03/22/2014  . Palpitations 03/22/2014   Past Medical History:  Diagnosis Date  . Allergy   . Anemia   . Anxiety   . Arthritis pain 08/20/2014  . Back pain   . COVID-19   . GERD (gastroesophageal reflux disease)   . History of stress test 02/2011 ([facility])   there was no evidence of ischemia, but she only went 3 1/2 minutes on the treadmill making it very difficult to get a good accurate assessment, however  . Hx of echocardiogram    The echocadiogram was essentially normal with the exception of mild mitral calcification and borderline concentric LVH, which in the setting of her hypertension at this early age is something that mean her blood pressure is well controlled.   . Hypertension   . Iron deficiency anemia, unspecified 07/09/2014  . Migraines   . Palpitations   . Swelling   . Vitamin B12 deficiency 03/17/2016    Family History  Problem Relation Age of Onset  . Asthma Mother   . Heart disease Mother   . Diabetes Mother   . Early death Mother   . Hypertension Mother   . Thyroid disease Mother   . Anxiety disorder Mother   . Hypertension Father   . Heart disease Father   . Breast cancer Sister 6  . Breast cancer Maternal Aunt        under 65  . Breast cancer Paternal Aunt   . Stroke Maternal Grandmother   . Heart disease Maternal Grandmother   . Hypertension Maternal Grandmother   . Hyperlipidemia Maternal Grandmother   . Breast cancer Maternal Grandmother     Past Surgical History:  Procedure Laterality Date  . BREAST SURGERY    . CARPAL TUNNEL RELEASE Right 09/24/2021   Procedure: RIGHT CARPAL TUNNEL RELEASE;  Surgeon: Tarry Kos, MD;  Location: [facility];  Service: Orthopedics;  Laterality: Right;  . CHOLECYSTECTOMY    . GASTRIC BYPASS  2008  . OOPHORECTOMY Right 2010  . REDUCTION MAMMAPLASTY Bilateral    Social History   Occupational History  . Occupation: Human resources officer  Tobacco Use  . Smoking  status: Never  . Smokeless tobacco: Never  Vaping Use  . Vaping status: Never Used  Substance and Sexual Activity  . Alcohol use: No  . Drug use: No  . Sexual activity: Yes

## 2023-07-31 ENCOUNTER — Inpatient Hospital Stay: Payer: 59

## 2023-07-31 VITALS — BP 123/83 | HR 93 | Temp 97.7°F | Resp 18

## 2023-07-31 DIAGNOSIS — D508 Other iron deficiency anemias: Secondary | ICD-10-CM

## 2023-07-31 DIAGNOSIS — E538 Deficiency of other specified B group vitamins: Secondary | ICD-10-CM

## 2023-07-31 DIAGNOSIS — D509 Iron deficiency anemia, unspecified: Secondary | ICD-10-CM | POA: Diagnosis not present

## 2023-07-31 MED ORDER — CYANOCOBALAMIN 1000 MCG/ML IJ SOLN
1000.0000 ug | Freq: Once | INTRAMUSCULAR | Status: AC
  Administered 2023-07-31: 1000 ug via INTRAMUSCULAR

## 2023-07-31 NOTE — Patient Instructions (Signed)
 Vitamin B12 Injection What is this medication? Vitamin B12 (VAHY tuh min B12) prevents and treats low vitamin B12 levels in your body. It is used in people who do not get enough vitamin B12 from their diet or when their digestive tract does not absorb enough. Vitamin B12 plays an important role in maintaining the health of your nervous system and red blood cells. This medicine may be used for other purposes; ask your health care provider or pharmacist if you have questions. COMMON BRAND NAME(S): B-12 Compliance Kit, B-12 Injection Kit, Cyomin, Dodex, LA-12, Nutri-Twelve, Physicians EZ Use B-12, Primabalt, Vitamin Deficiency Injectable System - B12 What should I tell my care team before I take this medication? They need to know if you have any of these conditions: Kidney disease Leber's disease Megaloblastic anemia An unusual or allergic reaction to cyanocobalamin, cobalt, other medications, foods, dyes, or preservatives Pregnant or trying to get pregnant Breast-feeding How should I use this medication? This medication is injected into a muscle or deeply under the skin. It is usually given in a clinic or care team's office. However, your care team may teach you how to inject yourself. Follow all instructions. Talk to your care team about the use of this medication in children. Special care may be needed. Overdosage: If you think you have taken too much of this medicine contact a poison control center or emergency room at once. NOTE: This medicine is only for you. Do not share this medicine with others. What if I miss a dose? If you are given your dose at a clinic or care team's office, call to reschedule your appointment. If you give your own injections, and you miss a dose, take it as soon as you can. If it is almost time for your next dose, take only that dose. Do not take double or extra doses. What may interact with this medication? Alcohol Colchicine This list may not describe all possible  interactions. Give your health care provider a list of all the medicines, herbs, non-prescription drugs, or dietary supplements you use. Also tell them if you smoke, drink alcohol, or use illegal drugs. Some items may interact with your medicine. What should I watch for while using this medication? Visit your care team regularly. You may need blood work done while you are taking this medication. You may need to follow a special diet. Talk to your care team. Limit your alcohol intake and avoid smoking to get the best benefit. What side effects may I notice from receiving this medication? Side effects that you should report to your care team as soon as possible: Allergic reactions--skin rash, itching, hives, swelling of the face, lips, tongue, or throat Swelling of the ankles, hands, or feet Trouble breathing Side effects that usually do not require medical attention (report to your care team if they continue or are bothersome): Diarrhea This list may not describe all possible side effects. Call your doctor for medical advice about side effects. You may report side effects to FDA at 1-800-FDA-1088. Where should I keep my medication? Keep out of the reach of children. Store at room temperature between 15 and 30 degrees C (59 and 85 degrees F). Protect from light. Throw away any unused medication after the expiration date. NOTE: This sheet is a summary. It may not cover all possible information. If you have questions about this medicine, talk to your doctor, pharmacist, or health care provider.  2024 Elsevier/Gold Standard (2021-06-10 00:00:00)

## 2023-08-02 ENCOUNTER — Telehealth: Payer: Self-pay | Admitting: Orthopaedic Surgery

## 2023-08-02 ENCOUNTER — Other Ambulatory Visit: Payer: Self-pay | Admitting: Physician Assistant

## 2023-08-02 MED ORDER — TRAMADOL HCL 50 MG PO TABS
50.0000 mg | ORAL_TABLET | Freq: Every day | ORAL | 0 refills | Status: AC | PRN
Start: 2023-08-02 — End: ?

## 2023-08-02 NOTE — Telephone Encounter (Signed)
Patient called asked if she can get something for pain. Patient said her Thumb on her left hand has a trigger finger. Patient asked if she could get an out of work note. Patient asked if she could get something called into her pharmacy for pain. The number to contact patient is  [phone]

## 2023-08-02 NOTE — Telephone Encounter (Signed)
Notified patient.

## 2023-08-02 NOTE — Telephone Encounter (Signed)
Sent in tramadol.  May not help trigger finger. I would suggest coming back in for that.  Cannot give out of work note for trigger finger

## 2023-08-06 ENCOUNTER — Ambulatory Visit: Payer: 59 | Admitting: Physician Assistant

## 2023-08-14 ENCOUNTER — Inpatient Hospital Stay: Payer: 59 | Attending: Hematology and Oncology

## 2023-08-14 VITALS — BP 112/67 | HR 72 | Temp 97.2°F | Resp 20

## 2023-08-14 DIAGNOSIS — D508 Other iron deficiency anemias: Secondary | ICD-10-CM

## 2023-08-14 DIAGNOSIS — D509 Iron deficiency anemia, unspecified: Secondary | ICD-10-CM | POA: Diagnosis present

## 2023-08-14 DIAGNOSIS — E538 Deficiency of other specified B group vitamins: Secondary | ICD-10-CM | POA: Diagnosis present

## 2023-08-14 MED ORDER — CYANOCOBALAMIN 1000 MCG/ML IJ SOLN
1000.0000 ug | Freq: Once | INTRAMUSCULAR | Status: AC
  Administered 2023-08-14: 1000 ug via INTRAMUSCULAR

## 2023-08-17 ENCOUNTER — Ambulatory Visit (INDEPENDENT_AMBULATORY_CARE_PROVIDER_SITE_OTHER): Payer: 59 | Admitting: Orthopaedic Surgery

## 2023-08-17 DIAGNOSIS — M65311 Trigger thumb, right thumb: Secondary | ICD-10-CM | POA: Diagnosis not present

## 2023-08-17 NOTE — Progress Notes (Signed)
Office Visit Note   Patient: Meredith Byrd           Date of Birth: January 02, 1981           MRN: [id number] Visit Date: 08/17/2023              Requested by: Irena Reichmann, DO [address] [place],  [place] [address] PCP: Irena Reichmann, DO   Assessment & Plan: Visit Diagnoses:  1. Trigger thumb, right thumb     Plan: Impression is right trigger thumb.,  We have discussed various treatment options to include repeat trigger thumb injection versus surgical intervention.  She would like to proceed with surgery at this time.  Risk, benefits and possible complications reviewed.  Rehab recovery time discussed.  All questions were answered.  Eunice Blase will call the patient to schedule surgery.  Follow-Up Instructions: Return for post-op.   Orders:  No orders of the defined types were placed in this encounter.  No orders of the defined types were placed in this encounter.     Procedures: No procedures performed   Clinical Data: No additional findings.   Subjective: Chief Complaint  Patient presents with   Right Hand - Pain    Right thumb trigger finger    HPI patient is a pleasant 42 year old female who comes in today with recurrent right trigger thumb.  She has had this for close to a year at this point.  She was seen in our office in July where trigger thumb injection was performed.  She had good relief but unfortunately only lasted a few weeks.  Pain and triggering have both returned.  Review of Systems as detailed in HPI.  All others reviewed and are negative.   Objective: Vital Signs: There were no vitals taken for this visit.  Physical Exam well-developed well-nourished female no acute distress.  Alert and oriented x 3.  Ortho Exam right thumb exam: Moderate tenderness palpable nodule at the A1 pulley.  She does have reproducible triggering.  She is neurovascularly intact distally.  Specialty Comments:  No specialty comments  available.  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   On Depo-Provera for contraception 05/31/2019   Chronic insomnia 05/17/2019   Lumbar facet arthropathy 12/14/2018   Dysmenorrhea, unspecified 05/16/2018   Backache 01/26/2018   Knee pain, left 11/17/2017   Generalized anxiety disorder 10/04/2017   Morning headache 05/04/2017   Daytime somnolence 05/04/2017   Class 3 obesity with body mass index (BMI) of 45.0 to 49.9 in adult 03/28/2017   Chronic low back pain 01/19/2017   Vitamin D deficiency 01/18/2017   Vitamin B12 deficiency 03/17/2016   Beta thalassemia trait 03/25/2015   Intractable migraine without aura and without status migrainosus 11/23/2014   Elevated sed rate 08/20/2014   Arthritis pain 08/20/2014   Iron deficiency anemia 07/09/2014   Symptomatic anemia 06/05/2014   Chest pain 05/01/2014   GERD (gastroesophageal reflux disease) 03/22/2014   S/P gastric bypass 03/22/2014   Insomnia 03/22/2014   Migraine 03/22/2014   Palpitations 03/22/2014   Past Medical History:  Diagnosis Date   Allergy    Anemia    Anxiety  Arthritis pain 08/20/2014   Back pain    COVID-19    GERD (gastroesophageal reflux disease)    History of stress test 02/2011 ([facility])   there was no evidence of ischemia, but she only went 3 1/2 minutes on the treadmill making it very difficult to get a good accurate assessment, however   Hx of echocardiogram    The echocadiogram was essentially normal with the exception of mild mitral calcification and borderline concentric LVH, which in the setting of her hypertension at this early age is something that mean her  blood pressure is well controlled.    Hypertension    Iron deficiency anemia, unspecified 07/09/2014   Migraines    Palpitations    Swelling    Vitamin B12 deficiency 03/17/2016    Family History  Problem Relation Age of Onset   Asthma Mother    Heart disease Mother    Diabetes Mother    Early death Mother    Hypertension Mother    Thyroid disease Mother    Anxiety disorder Mother    Hypertension Father    Heart disease Father    Breast cancer Sister 21   Breast cancer Maternal Aunt        under 77   Breast cancer Paternal Aunt    Stroke Maternal Grandmother    Heart disease Maternal Grandmother    Hypertension Maternal Grandmother    Hyperlipidemia Maternal Grandmother    Breast cancer Maternal Grandmother     Past Surgical History:  Procedure Laterality Date   BREAST SURGERY     CARPAL TUNNEL RELEASE Right 09/24/2021   Procedure: RIGHT CARPAL TUNNEL RELEASE;  Surgeon: Tarry Kos, MD;  Location: [facility];  Service: Orthopedics;  Laterality: Right;   CHOLECYSTECTOMY     GASTRIC BYPASS  2008   OOPHORECTOMY Right 2010   REDUCTION MAMMAPLASTY Bilateral    Social History   Occupational History   Occupation: Human resources officer  Tobacco Use   Smoking status: Never   Smokeless tobacco: Never  Vaping Use   Vaping status: Never Used  Substance and Sexual Activity   Alcohol use: No   Drug use: No   Sexual activity: Yes

## 2023-08-19 ENCOUNTER — Telehealth: Payer: Self-pay | Admitting: Orthopaedic Surgery

## 2023-08-19 NOTE — Telephone Encounter (Signed)
Patient called asked how long is she expected to be out of work after surgery? 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  Patient said she is trying to have this information for her employer. The number to contact patient is [phone]

## 2023-08-19 NOTE — Telephone Encounter (Signed)
3 weeks

## 2023-08-19 NOTE — Telephone Encounter (Signed)
sure

## 2023-08-19 NOTE — Telephone Encounter (Signed)
Called patient. She states that she would need 4 weeks out of work. Please advise.

## 2023-08-20 NOTE — Telephone Encounter (Signed)
Spoke with patient. She is ready to schedule surgery.

## 2023-08-24 ENCOUNTER — Ambulatory Visit: Payer: 59 | Admitting: Physician Assistant

## 2023-08-28 ENCOUNTER — Inpatient Hospital Stay: Payer: 59

## 2023-09-11 ENCOUNTER — Inpatient Hospital Stay: Payer: 59

## 2023-09-11 ENCOUNTER — Encounter: Payer: Self-pay | Admitting: Hematology and Oncology

## 2023-09-11 VITALS — BP 113/70 | HR 80 | Temp 97.9°F | Resp 20

## 2023-09-11 DIAGNOSIS — E538 Deficiency of other specified B group vitamins: Secondary | ICD-10-CM

## 2023-09-11 DIAGNOSIS — D508 Other iron deficiency anemias: Secondary | ICD-10-CM

## 2023-09-11 MED ORDER — CYANOCOBALAMIN 1000 MCG/ML IJ SOLN
1000.0000 ug | Freq: Once | INTRAMUSCULAR | Status: AC
  Administered 2023-09-11: 1000 ug via INTRAMUSCULAR
  Filled 2023-09-11: qty 1

## 2023-09-11 NOTE — Telephone Encounter (Signed)
At 1015 today I left a message letting patient know that she is our last patient today at 1230 and that all other patients will be done prior to that so I advised her to be here before or at 1230 since we will leave then by 1245. She tends to come various times and not the appt time so I wanted to be sure to give her advanced notice of early closure today due to patient census.

## 2023-09-20 NOTE — Progress Notes (Unsigned)
Office Visit Note   Patient: Meredith Byrd           Date of Birth: [birth year]           MRN: [id number] Visit Date: 09/21/2023              Requested by: Irena Reichmann, DO [address] [place],  [place] [address] PCP: Irena Reichmann, DO   Assessment & Plan: Visit Diagnoses:  1. Chronic pain of right knee     Plan: ***  Follow-Up Instructions: No follow-ups on file.   Orders:  No orders of the defined types were placed in this encounter.  No orders of the defined types were placed in this encounter.     Procedures: No procedures performed   Clinical Data: No additional findings.   Subjective: No chief complaint on file.   HPI  Review of Systems  Constitutional: Negative.   HENT: Negative.    Eyes: Negative.   Respiratory: Negative.    Cardiovascular: Negative.   Endocrine: Negative.   Musculoskeletal: Negative.   Neurological: Negative.   Hematological: Negative.   Psychiatric/Behavioral: Negative.    All other systems reviewed and are negative.   Objective: Vital Signs: There were no vitals taken for this visit.  Physical Exam Vitals and nursing note reviewed.  Constitutional:      Appearance: She is well-developed.  HENT:     Head: Normocephalic and atraumatic.  Pulmonary:     Effort: Pulmonary effort is normal.  Abdominal:     Palpations: Abdomen is soft.  Musculoskeletal:     Cervical back: Neck supple.  Skin:    General: Skin is warm.     Capillary Refill: Capillary refill takes less than 2 seconds.  Neurological:     Mental Status: She is alert and oriented to person, place, and time.  Psychiatric:        Behavior: Behavior normal.        Thought Content: Thought content normal.        Judgment: Judgment normal.   Ortho Exam  Specialty Comments:  No specialty comments available.  Imaging: No results found.   PMFS History: Patient Active Problem List   Diagnosis Date Noted  . Autism 09/01/2022  .  Primary osteoarthritis of left knee 03/31/2022  . Primary osteoarthritis of right knee 03/31/2022  . Chronic pain of right knee 06/06/2021  . Right carpal tunnel syndrome 01/23/2021  . No-show for appointment 11/25/2020  . 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 On Depo-Provera for contraception 05/31/2019  . Chronic insomnia 05/17/2019  . Lumbar facet arthropathy 12/14/2018  . Dysmenorrhea, unspecified 05/16/2018  . Backache 01/26/2018  . Knee pain, left 11/17/2017  . Generalized anxiety disorder 10/04/2017  . Morning headache 05/04/2017  . Daytime somnolence 05/04/2017  . Class 3 obesity with body mass index (BMI) of 45.0 to 49.9 in adult 03/28/2017  . Chronic low back pain 01/19/2017  . Vitamin D deficiency 01/18/2017  . Vitamin B12 deficiency 03/17/2016  . Beta thalassemia trait 03/25/2015  . Intractable migraine without aura and without status migrainosus 11/23/2014  . Elevated sed rate 08/20/2014  . Arthritis pain 08/20/2014  . Iron deficiency anemia 07/09/2014  . Symptomatic anemia 06/05/2014  . Chest pain 05/01/2014  . GERD (gastroesophageal reflux disease) 03/22/2014  . S/P gastric bypass 03/22/2014  . Insomnia 03/22/2014  . Migraine 03/22/2014  . Palpitations 03/22/2014   Past Medical History:  Diagnosis Date  . Allergy   . Anemia   .  Anxiety   . Arthritis pain 08/20/2014  . Back pain   . COVID-19   . GERD (gastroesophageal reflux disease)   . History of stress test 02/2011 ([facility])   there was no evidence of ischemia, but she only went 3 1/2 minutes on the treadmill making it very difficult to get a good accurate assessment, however  . Hx of echocardiogram    The echocadiogram was essentially normal with the exception of mild mitral calcification and borderline concentric LVH, which in the setting of her hypertension at this early age is something that mean her blood pressure is well controlled.   . Hypertension   . Iron deficiency anemia, unspecified  07/09/2014  . Migraines   . Palpitations   . Swelling   . Vitamin B12 deficiency 03/17/2016    Family History  Problem Relation Age of Onset  . Asthma Mother   . Heart disease Mother   . Diabetes Mother   . Early death Mother   . Hypertension Mother   . Thyroid disease Mother   . Anxiety disorder Mother   . Hypertension Father   . Heart disease Father   . Breast cancer Sister 64  . Breast cancer Maternal Aunt        under 65  . Breast cancer Paternal Aunt   . Stroke Maternal Grandmother   . Heart disease Maternal Grandmother   . Hypertension Maternal Grandmother   . Hyperlipidemia Maternal Grandmother   . Breast cancer Maternal Grandmother     Past Surgical History:  Procedure Laterality Date  . BREAST SURGERY    . CARPAL TUNNEL RELEASE Right 09/24/2021   Procedure: RIGHT CARPAL TUNNEL RELEASE;  Surgeon: Tarry Kos, MD;  Location: [facility];  Service: Orthopedics;  Laterality: Right;  . CHOLECYSTECTOMY    . GASTRIC BYPASS  2008  . OOPHORECTOMY Right 2010  . REDUCTION MAMMAPLASTY Bilateral    Social History   Occupational History  . Occupation: Human resources officer  Tobacco Use  . Smoking status: Never  . Smokeless tobacco: Never  Vaping Use  . Vaping status: Never Used  Substance and Sexual Activity  . Alcohol use: No  . Drug use: No  . Sexual activity: Yes

## 2023-09-21 ENCOUNTER — Ambulatory Visit: Payer: 59 | Admitting: Orthopaedic Surgery

## 2023-09-21 DIAGNOSIS — G8929 Other chronic pain: Secondary | ICD-10-CM

## 2023-09-25 ENCOUNTER — Telehealth: Payer: Self-pay

## 2023-09-25 ENCOUNTER — Inpatient Hospital Stay: Payer: 59 | Attending: Hematology and Oncology

## 2023-09-25 DIAGNOSIS — E538 Deficiency of other specified B group vitamins: Secondary | ICD-10-CM | POA: Insufficient documentation

## 2023-09-25 DIAGNOSIS — D509 Iron deficiency anemia, unspecified: Secondary | ICD-10-CM | POA: Insufficient documentation

## 2023-09-25 NOTE — Telephone Encounter (Signed)
Attempted to call patient on primary number regarding today's appointment. No answer. Left voicemail notifying the patient that we would still be able to accommodate her B12 injection, if she is able to make to the clinic prior to 2 PM. If patient is unable to make it, patient notified to call the office on Monday to reschedule the appointment. Clinic number provided should she need to callback.

## 2023-10-01 ENCOUNTER — Telehealth: Payer: Self-pay | Admitting: Orthopaedic Surgery

## 2023-10-01 NOTE — Telephone Encounter (Signed)
Patient is requesting oow x 1 month. Please advise if okay. Thank you!

## 2023-10-03 NOTE — Telephone Encounter (Signed)
What kind of work does she do?

## 2023-10-04 ENCOUNTER — Ambulatory Visit: Payer: 59

## 2023-10-04 ENCOUNTER — Inpatient Hospital Stay: Payer: 59

## 2023-10-04 ENCOUNTER — Other Ambulatory Visit: Payer: Self-pay | Admitting: Hematology and Oncology

## 2023-10-04 ENCOUNTER — Inpatient Hospital Stay (HOSPITAL_BASED_OUTPATIENT_CLINIC_OR_DEPARTMENT_OTHER): Payer: 59 | Admitting: Hematology and Oncology

## 2023-10-04 VITALS — BP 125/76 | HR 66 | Temp 97.9°F | Resp 15 | Wt 187.3 lb

## 2023-10-04 DIAGNOSIS — E538 Deficiency of other specified B group vitamins: Secondary | ICD-10-CM | POA: Diagnosis present

## 2023-10-04 DIAGNOSIS — D508 Other iron deficiency anemias: Secondary | ICD-10-CM

## 2023-10-04 DIAGNOSIS — D563 Thalassemia minor: Secondary | ICD-10-CM

## 2023-10-04 DIAGNOSIS — D509 Iron deficiency anemia, unspecified: Secondary | ICD-10-CM | POA: Diagnosis present

## 2023-10-04 LAB — CMP (CANCER CENTER ONLY)
ALT: 59 U/L — ABNORMAL HIGH (ref 0–44)
AST: 36 U/L (ref 15–41)
Albumin: 4.3 g/dL (ref 3.5–5.0)
Alkaline Phosphatase: 66 U/L (ref 38–126)
Anion gap: 8 (ref 5–15)
BUN: 6 mg/dL (ref 6–20)
CO2: 25 mmol/L (ref 22–32)
Calcium: 9.4 mg/dL (ref 8.9–10.3)
Chloride: 106 mmol/L (ref 98–111)
Creatinine: 0.58 mg/dL (ref 0.44–1.00)
GFR, Estimated: >60 mL/min (ref >60)
Glucose, Bld: 98 mg/dL (ref 70–99)
Potassium: 3.6 mmol/L (ref 3.5–5.1)
Sodium: 139 mmol/L (ref 135–145)
Total Bilirubin: 0.4 mg/dL (ref <1.2)
Total Protein: 7.2 g/dL (ref 6.5–8.1)

## 2023-10-04 LAB — RETIC PANEL
Immature Retic Fract: 14.8 % (ref 2.3–15.9)
RBC.: 4.76 MIL/uL (ref 3.87–5.11)
Retic Count, Absolute: 67.6 10*3/uL (ref 19.0–186.0)
Retic Ct Pct: 1.4 % (ref 0.4–3.1)
Reticulocyte Hemoglobin: 23.9 pg — ABNORMAL LOW (ref >27.9)

## 2023-10-04 LAB — CBC WITH DIFFERENTIAL (CANCER CENTER ONLY)
Abs Immature Granulocytes: 0.01 10*3/uL (ref 0.00–0.07)
Basophils Absolute: 0 10*3/uL (ref 0.0–0.1)
Basophils Relative: 0 %
Eosinophils Absolute: 0.1 10*3/uL (ref 0.0–0.5)
Eosinophils Relative: 1 %
HCT: 36.8 % (ref 36.0–46.0)
Hemoglobin: 12.4 g/dL (ref 12.0–15.0)
Immature Granulocytes: 0 %
Lymphocytes Relative: 28 %
Lymphs Abs: 1.7 10*3/uL (ref 0.7–4.0)
MCH: 26.4 pg (ref 26.0–34.0)
MCHC: 33.7 g/dL (ref 30.0–36.0)
MCV: 78.5 fL — ABNORMAL LOW (ref 80.0–100.0)
Monocytes Absolute: 0.3 10*3/uL (ref 0.1–1.0)
Monocytes Relative: 5 %
Neutro Abs: 4 10*3/uL (ref 1.7–7.7)
Neutrophils Relative %: 66 %
Platelet Count: 425 10*3/uL — ABNORMAL HIGH (ref 150–400)
RBC: 4.69 MIL/uL (ref 3.87–5.11)
RDW: 15.9 % — ABNORMAL HIGH (ref 11.5–15.5)
WBC Count: 6.1 10*3/uL (ref 4.0–10.5)
nRBC: 0 % (ref 0.0–0.2)

## 2023-10-04 LAB — IRON AND IRON BINDING CAPACITY (CC-WL,HP ONLY)
Iron: 45 ug/dL (ref 28–170)
Saturation Ratios: 13 % (ref 10.4–31.8)
TIBC: 360 ug/dL (ref 250–450)
UIBC: 315 ug/dL (ref 148–442)

## 2023-10-04 LAB — FERRITIN: Ferritin: 247 ng/mL (ref 11–307)

## 2023-10-04 LAB — VITAMIN B12: Vitamin B-12: 602 pg/mL (ref 180–914)

## 2023-10-04 MED ORDER — CYANOCOBALAMIN 1000 MCG/ML IJ SOLN
1000.0000 ug | Freq: Once | INTRAMUSCULAR | Status: AC
Start: 2023-10-04 — End: 2023-10-04
  Administered 2023-10-04: 1000 ug via INTRAMUSCULAR
  Filled 2023-10-04: qty 1

## 2023-10-04 NOTE — Telephone Encounter (Signed)
Noted for [employer]. 

## 2023-10-04 NOTE — Progress Notes (Unsigned)
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 Leonides Schanz  12/20-12/27/2021: IV feraheme 510mg  q 7 days x 2 doses and 2 doses of IM Vitamin b12 .  12/26/2020: WBC 4.3, Hgb 11.8, MCV 79.4, Plt 432 01/09/2022: Wbc 5.9, Hgb 12.1, MCV 77.2, Plt 544 09/28/2022: WBC 4.7, Hgb 11.2, MCV 79.2, Plt 365 12/25/2022: WBC 4.5, Hgb 11.2, MCV 79.2, Plt 454  03/26/2023: WBC 4.5, Hgb 11.2, MCV 79.8, Plt 471  Interval History:  Meredith Byrd 42 y.o. female with medical history significant for iron deficiency anemia 2/2 to gastric bipass surgery who presents for a follow up visit. The patient's last visit was on 07/01/2023. In the interim since the last visit she has had no major changes in her health.   On exam today Meredith Byrd reports she will be staying local for the holidays.  She reports that her energy levels are about the same in the room since her last visit.  She reports she received 2 doses of IV iron in September.  She reports that she has not been having any bleeding "to the best of her knowledge".  She denies any dark bowel movements or bright red blood in the urine or stool.  She reports her appetite levels have been good and she is not having any lightheadedness, dizziness, shortness of breath.  She denies any runny nose, sore throat, or cough.  She reports that she has not had any recent hospitalizations or emergency  room visits.  Her weight is down about 7 pounds since September and she is unsure how this is occurring.  She notes that she would like to continue receiving her vitamin B12 injections on Saturdays and would like to get 1 today.  We were able to have this arranged.  She denies fevers, chills, shortness of breath, chest pain or cough. She is had no other changes in her health at this time.  A full 10 point ROS is listed below.  MEDICAL HISTORY:  Past Medical History:  Diagnosis Date   Allergy    Anemia    Anxiety    Arthritis pain 08/20/2014   Back pain    COVID-19    GERD (gastroesophageal reflux disease)    History of stress test 02/2011 ([facility])   there was no evidence of ischemia, but she only went 3 1/2 minutes on the treadmill making it very difficult to get a good accurate assessment, however   Hx of echocardiogram    The echocadiogram was essentially normal with the exception of mild mitral calcification and borderline concentric LVH, which in the setting of her hypertension at this early age is something that mean her blood pressure is well controlled.    Hypertension    Iron deficiency anemia, unspecified 07/09/2014   Migraines    Palpitations    Swelling    Vitamin B12 deficiency 03/17/2016    SURGICAL HISTORY: Past Surgical History:  Procedure Laterality Date   BREAST SURGERY  CARPAL TUNNEL RELEASE Right 09/24/2021   Procedure: RIGHT CARPAL TUNNEL RELEASE;  Surgeon: Tarry Kos, MD;  Location: [facility];  Service: Orthopedics;  Laterality: Right;   CHOLECYSTECTOMY     GASTRIC BYPASS  2008   OOPHORECTOMY Right 2010   REDUCTION MAMMAPLASTY Bilateral     SOCIAL HISTORY: Social History   Socioeconomic History   Marital status: Married    Spouse name: Damon   Number of children: 4   Years of education: Associates   Highest education level: Not on file  Occupational History   Occupation: Insurance Claims  Tobacco Use   Smoking status: Never    Smokeless tobacco: Never  Vaping Use   Vaping status: Never Used  Substance and Sexual Activity   Alcohol use: No   Drug use: No   Sexual activity: Yes  Other Topics Concern   Not on file  Social History Narrative   Right handed.   Lives at home with husband and four children.   No caffeine use.   Social Drivers of Corporate investment banker Strain: Not on file  Food Insecurity: Not on file  Transportation Needs: Not on file  Physical Activity: Not on file  Stress: Not on file  Social Connections: Not on file  Intimate Partner Violence: Not on file    FAMILY HISTORY: Family History  Problem Relation Age of Onset   Asthma Mother    Heart disease Mother    Diabetes Mother    Early death Mother    Hypertension Mother    Thyroid disease Mother    Anxiety disorder Mother    Hypertension Father    Heart disease Father    Breast cancer Sister 29   Breast cancer Maternal Aunt        under 35   Breast cancer Paternal Aunt    Stroke Maternal Grandmother    Heart disease Maternal Grandmother    Hypertension Maternal Grandmother    Hyperlipidemia Maternal Grandmother    Breast cancer Maternal Grandmother     ALLERGIES:  is allergic to doxycycline, fioricet [butalbital-apap-caffeine], inderal [propranolol], and nsaids.  MEDICATIONS:  Current Outpatient Medications  Medication Sig Dispense Refill   acetaminophen (TYLENOL) 500 MG tablet Take 2 tablets (1,000 mg total) by mouth every 6 (six) hours as needed. 30 tablet 0   acetaminophen-codeine (TYLENOL #3) 300-30 MG tablet Take 1 tablet by mouth 2 (two) times daily as needed for moderate pain. 30 tablet 0   albuterol (VENTOLIN HFA) 108 (90 Base) MCG/ACT inhaler Inhale 1-2 puffs into the lungs every 6 (six) hours as needed for wheezing or shortness of breath. 18 g 0   cloNIDine (CATAPRES) 0.2 MG tablet Take 0.4 mg by mouth at bedtime.     Cyanocobalamin (B-12 COMPLIANCE INJECTION) 1000 MCG/ML KIT Inject as directed.      Galcanezumab-gnlm (EMGALITY) 120 MG/ML SOSY 240 mg subQ (2 consecutive 120-mg doses) once as a loading dose, followed by 120 mg once monthly 3 mL 6   medroxyPROGESTERone (DEPO-PROVERA) 150 MG/ML injection      montelukast (SINGULAIR) 10 MG tablet Take 10 mg by mouth at bedtime.     traMADol (ULTRAM) 50 MG tablet Take 1 tablet (50 mg total) by mouth daily as needed. 20 tablet 0   Ubrogepant (UBRELVY) 100 MG TABS Take 1 tablet at the onset of migraine. Can repeat in 2 hours if needed. Only 2 tabs/24 hours 12 tablet 11   UNABLE TO FIND Med Name: allergy injections  3 times a week     verapamil (CALAN-SR) 120 MG CR tablet Take 1 tablet (120 mg total) by mouth at bedtime. 30 tablet 11   Vitamin D, Ergocalciferol, (DRISDOL) 1.25 MG (50000 UNIT) CAPS capsule TAKE 1 CAPSULE BY MOUTH EVERY 7 DAYS 12 capsule 0   No current facility-administered medications for this visit.    REVIEW OF SYSTEMS:   Constitutional: ( - ) fevers, ( - )  chills , ( - ) night sweats Eyes: ( - ) blurriness of vision, ( - ) double vision, ( - ) watery eyes Ears, nose, mouth, throat, and face: ( - ) mucositis, ( - ) sore throat Respiratory: ( - ) cough, ( - ) dyspnea, ( - ) wheezes Cardiovascular: ( - ) palpitation, ( - ) chest discomfort, ( - ) lower extremity swelling Gastrointestinal:  ( - ) nausea, ( - ) heartburn, ( - ) change in bowel habits Skin: ( - ) abnormal skin rashes Lymphatics: ( - ) new lymphadenopathy, ( - ) easy bruising Neurological: ( - ) numbness, ( - ) tingling, ( - ) new weaknesses Behavioral/Psych: ( - ) mood change, ( - ) new changes  All other systems were reviewed with the patient and are negative.  PHYSICAL EXAMINATION:  Vitals:   10/04/23 1446  BP: 125/76  Pulse: 66  Resp: 15  Temp: 97.9 F (36.6 C)  SpO2: 100%      Filed Weights   10/04/23 1446  Weight: 187 lb 4.8 oz (85 kg)     GENERAL: tired appearing middle aged [place] American female. alert, no distress and  comfortable SKIN: skin color, texture, turgor are normal, no rashes or significant lesions EYES: conjunctiva are pink and non-injected, sclera clear LUNGS: clear to auscultation and percussion with normal breathing effort HEART: regular rate & rhythm and no murmurs and no lower extremity edema Musculoskeletal: no cyanosis of digits and no clubbing  PSYCH: alert & oriented x 3, fluent speech NEURO: no focal motor/sensory deficits  LABORATORY DATA:  I have reviewed the data as listed    Latest Ref Rng & Units 10/04/2023    2:24 PM 07/01/2023    9:29 AM 03/26/2023    1:05 PM  CBC  WBC 4.0 - 10.5 K/uL 6.1  4.4  4.5   Hemoglobin 12.0 - 15.0 g/dL 78.2  95.6  21.3   Hematocrit 36.0 - 46.0 % 36.8  34.8  36.4   Platelets 150 - 400 K/uL 425  388  471        Latest Ref Rng & Units 10/04/2023    2:24 PM 07/01/2023    9:29 AM 03/26/2023    1:05 PM  CMP  Glucose 70 - 99 mg/dL 98  84  086   BUN 6 - 20 mg/dL 6  9  9    Creatinine 0.44 - 1.00 mg/dL 5.78  4.69  6.29   Sodium 135 - 145 mmol/L 139  141  141   Potassium 3.5 - 5.1 mmol/L 3.6  4.0  3.9   Chloride 98 - 111 mmol/L 106  109  107   CO2 22 - 32 mmol/L 25  28  26    Calcium 8.9 - 10.3 mg/dL 9.4  9.0  9.7   Total Protein 6.5 - 8.1 g/dL 7.2  7.1  7.3   Total Bilirubin <1.2 mg/dL 0.4  0.4  0.4   Alkaline Phos 38 - 126 U/L 66  57  57   AST 15 -  41 U/L 36  22  13   ALT 0 - 44 U/L 59  26  15     RADIOGRAPHIC STUDIES: No results found.  ASSESSMENT & PLAN Meredith Byrd 42 y.o. female with medical history significant for iron deficiency anemia 2/2 to gastric bipass surgery who presents for a follow up visit.  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  -- labs today show WBC 6.1, hemoglobin 12.4, MCV 78.5, platelets  425.  Vitamin B12 is 602 and ferritin is 247 with iron sat of 13% --Currently receiving IM B12 injection 1000 mcg q 2 weeks. Recommend to continue. -- Due to low ferritin and iron saturation with inability to tolerate p.o. iron therapy will plan to proceed with IV iron. --Return to clinic in 3 months with interval vitamin B12 injections every 2 weeks.  #Fatigue, stable --Patient still has marked fatigue despite adequate repletion of iron and vitamin B12.   --normal TSH and hemoglobin A1c from 12/26/2020 --Folate levels have normalized from 03/21/2021 --H/O of vitamin D deficiency so currently takes vitamin D supplementation.    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   No orders of the defined types were placed in this encounter.  All questions were answered. The patient knows to call the clinic with any problems, questions or concerns.  I have spent a total of 30 minutes minutes of face-to-face and non-face-to-face time, preparing to see the patient, performing a medically appropriate examination, counseling and educating the patient,  documenting clinical information in the electronic health record, and care coordination.   Ulysees Barns, MD Department of Hematology/Oncology [facility] at [facility] Phone: [phone] Pager: [phone] Email: [email or web address]   10/05/2023 12:49 PM

## 2023-10-05 ENCOUNTER — Encounter: Payer: Self-pay | Admitting: Hematology and Oncology

## 2023-10-06 LAB — METHYLMALONIC ACID, SERUM: Methylmalonic Acid, Quantitative: 98 nmol/L (ref 0–378)

## 2023-10-07 ENCOUNTER — Other Ambulatory Visit: Payer: Self-pay | Admitting: Orthopaedic Surgery

## 2023-10-07 DIAGNOSIS — M65311 Trigger thumb, right thumb: Secondary | ICD-10-CM | POA: Diagnosis not present

## 2023-10-07 MED ORDER — HYDROCODONE-ACETAMINOPHEN 5-325 MG PO TABS: 1.0000 | ORAL_TABLET | Freq: Two times a day (BID) | ORAL | 0 refills | Status: DC | PRN

## 2023-10-07 MED ORDER — ONDANSETRON HCL 4 MG PO TABS: 4.0000 mg | ORAL_TABLET | Freq: Three times a day (TID) | ORAL | 0 refills | Status: AC | PRN

## 2023-10-08 ENCOUNTER — Telehealth: Payer: Self-pay | Admitting: *Deleted

## 2023-10-08 NOTE — Telephone Encounter (Signed)
-----   Message from Ulysees Barns IV sent at 10/05/2023 12:50 PM EST ----- Please let Meredith Byrd know that her iron levels are within normal limits and she does not currently require any iron therapy.  We will continue her vitamin B12 shots every 2 weeks on Saturdays. ----- Message ----- From: Interface, Lab In [place] Sent: 10/04/2023   2:40 PM EST To: Jaci Standard, MD

## 2023-10-08 NOTE — Telephone Encounter (Signed)
TCT patient regarding recent lab results. Spoke with her. Advised  that her iron levels are within normal limits and she does not currently require any iron therapy. We will continue her vitamin B12 shots every 2 weeks on Saturdays. Pt voiced understanding. Her injection appts are scheduled out through the end of March 2025

## 2023-10-10 ENCOUNTER — Other Ambulatory Visit: Payer: Self-pay | Admitting: Neurology

## 2023-10-19 ENCOUNTER — Encounter: Payer: 59 | Admitting: Physician Assistant

## 2023-10-20 ENCOUNTER — Telehealth: Payer: Self-pay | Admitting: Hematology and Oncology

## 2023-10-22 ENCOUNTER — Encounter: Payer: 59 | Admitting: Physician Assistant

## 2023-10-23 ENCOUNTER — Inpatient Hospital Stay: Payer: 59

## 2023-10-25 ENCOUNTER — Inpatient Hospital Stay: Payer: 59 | Attending: Hematology and Oncology

## 2023-10-25 NOTE — Progress Notes (Deleted)
 Post-Op Visit Note   Patient: Meredith Byrd           Date of Birth: 1981/04/20           MRN: [id number] Visit Date: 10/26/2023 PCP: Gerome Brunet, DO   Assessment & Plan:  Chief Complaint: No chief complaint on file.  Visit Diagnoses:  1. Trigger thumb, right thumb     Plan: ***  Follow-Up Instructions: No follow-ups on file.   Orders:  No orders of the defined types were placed in this encounter.  No orders of the defined types were placed in this encounter.   Imaging: No results found.  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   On Depo-Provera  for contraception 05/31/2019   Chronic insomnia 05/17/2019   Lumbar facet arthropathy 12/14/2018   Dysmenorrhea, unspecified 05/16/2018   Backache 01/26/2018   Knee pain, left 11/17/2017   Generalized anxiety disorder 10/04/2017   Morning headache 05/04/2017   Daytime somnolence 05/04/2017   Class 3 obesity with body mass index (BMI) of 45.0 to 49.9 in adult 03/28/2017   Chronic low back pain 01/19/2017   Vitamin D  deficiency 01/18/2017   Vitamin B12 deficiency 03/17/2016   Beta thalassemia trait 03/25/2015   Intractable migraine without aura and without status migrainosus 11/23/2014   Elevated sed rate 08/20/2014   Arthritis pain 08/20/2014   Iron deficiency anemia 07/09/2014   Symptomatic anemia 06/05/2014   Chest pain 05/01/2014   GERD (gastroesophageal reflux disease) 03/22/2014   S/P gastric bypass 03/22/2014   Insomnia 03/22/2014   Migraine 03/22/2014   Palpitations 03/22/2014   Past Medical History:  Diagnosis Date   Allergy    Anemia    Anxiety    Arthritis pain 08/20/2014    Back pain    COVID-19    GERD (gastroesophageal reflux disease)    History of stress test 02/2011 ([facility])   there was no evidence of ischemia, but she only went 3 1/2 minutes on the treadmill making it very difficult to get a good accurate assessment, however   Hx of echocardiogram    The echocadiogram was essentially normal with the exception of mild mitral calcification and borderline concentric LVH, which in the setting of her hypertension at this early age is something that mean her blood pressure is well controlled.    Hypertension    Iron deficiency anemia, unspecified 07/09/2014   Migraines    Palpitations    Swelling    Vitamin B12 deficiency 03/17/2016    Family History  Problem Relation Age of Onset   Asthma Mother    Heart disease Mother    Diabetes Mother    Early death Mother    Hypertension Mother    Thyroid  disease Mother    Anxiety disorder Mother    Hypertension Father    Heart disease Father    Breast cancer Sister 39   Breast cancer Maternal Aunt        under 39   Breast cancer Paternal Aunt    Stroke Maternal Grandmother    Heart disease Maternal Grandmother    Hypertension Maternal Grandmother    Hyperlipidemia Maternal Grandmother    Breast cancer Maternal Grandmother  Past Surgical History:  Procedure Laterality Date   BREAST SURGERY     CARPAL TUNNEL RELEASE Right 09/24/2021   Procedure: RIGHT CARPAL TUNNEL RELEASE;  Surgeon: Jerri Kay HERO, MD;  Location: [facility];  Service: Orthopedics;  Laterality: Right;   CHOLECYSTECTOMY     GASTRIC BYPASS  2008   OOPHORECTOMY Right 2010   REDUCTION MAMMAPLASTY Bilateral    Social History   Occupational History   Occupation: Human Resources Officer  Tobacco Use   Smoking status: Never   Smokeless tobacco: Never  Vaping Use   Vaping status: Never Used  Substance and Sexual Activity   Alcohol use: No   Drug use: No   Sexual activity: Yes

## 2023-10-26 ENCOUNTER — Ambulatory Visit (INDEPENDENT_AMBULATORY_CARE_PROVIDER_SITE_OTHER): Payer: 59 | Admitting: Orthopaedic Surgery

## 2023-10-26 ENCOUNTER — Telehealth: Payer: Self-pay | Admitting: Hematology and Oncology

## 2023-10-26 ENCOUNTER — Ambulatory Visit: Payer: 59 | Admitting: Orthopaedic Surgery

## 2023-10-26 DIAGNOSIS — M65311 Trigger thumb, right thumb: Secondary | ICD-10-CM

## 2023-10-26 DIAGNOSIS — M1711 Unilateral primary osteoarthritis, right knee: Secondary | ICD-10-CM

## 2023-10-26 MED ORDER — BUPIVACAINE HCL 0.5 % IJ SOLN
2.0000 mL | INTRAMUSCULAR | Status: AC | PRN
  Administered 2023-10-26: 2 mL via INTRA_ARTICULAR

## 2023-10-26 MED ORDER — METHYLPREDNISOLONE ACETATE 40 MG/ML IJ SUSP
40.0000 mg | INTRAMUSCULAR | Status: AC | PRN
  Administered 2023-10-26: 40 mg via INTRA_ARTICULAR

## 2023-10-26 MED ORDER — LIDOCAINE HCL 1 % IJ SOLN
2.0000 mL | INTRAMUSCULAR | Status: AC | PRN
  Administered 2023-10-26: 2 mL

## 2023-10-26 NOTE — Progress Notes (Signed)
 Office Visit Note   Patient: Meredith Byrd           Date of Birth: April 25, 1981           MRN: [id number] Visit Date: 10/26/2023              Requested by: Gerome Brunet, DO [address] [place],  [place] [address] PCP: Gerome Brunet, DO   Assessment & Plan: Visit Diagnoses:  1. Trigger thumb, right thumb   2. Primary osteoarthritis of right knee     Plan: For the right knee cortisone injection was repeated.  For the thumb we remove the sutures and we will send her to hand therapy for a few sessions.  Follow-Up Instructions: No follow-ups on file.   Orders:  Orders Placed This Encounter  Procedures   Large Joint Inj: R knee   Ambulatory referral to Occupational Therapy   No orders of the defined types were placed in this encounter.     Procedures: Large Joint Inj: R knee on 10/26/2023 2:38 PM Indications: pain Details: 22 G needle  Arthrogram: No  Medications: 40 mg methylPREDNISolone  acetate 40 MG/ML; 2 mL lidocaine  1 %; 2 mL bupivacaine  0.5 % Consent was given by the patient. Patient was prepped and draped in the usual sterile fashion.       Clinical Data: No additional findings.   Subjective: Chief Complaint  Patient presents with   Right Knee - Pain   Right Hand - Pain    HPI Patient is 3 weeks status post right trigger thumb release.  She is also here for follow-up for right knee pain.  Requesting another cortisone injection. Review of Systems   Objective: Vital Signs: There were no vitals taken for this visit.  Physical Exam  Ortho Exam Examination of the right knee is unchanged from prior visit. Examination of the right thumb shows healed surgical incision.  No signs of infection. Specialty Comments:  No specialty comments available.  Imaging: No results found.   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   On Depo-Provera  for contraception 05/31/2019   Chronic insomnia 05/17/2019   Lumbar facet arthropathy 12/14/2018   Dysmenorrhea, unspecified 05/16/2018   Backache 01/26/2018   Knee pain, left 11/17/2017   Generalized anxiety disorder 10/04/2017   Morning headache 05/04/2017   Daytime somnolence 05/04/2017   Class 3 obesity with body mass index (BMI) of 45.0 to 49.9 in adult 03/28/2017   Chronic low back pain 01/19/2017   Vitamin D  deficiency 01/18/2017   Vitamin B12 deficiency 03/17/2016   Beta thalassemia trait 03/25/2015   Intractable migraine without aura and without status migrainosus 11/23/2014   Elevated sed rate 08/20/2014   Arthritis pain 08/20/2014   Iron deficiency anemia 07/09/2014   Symptomatic anemia 06/05/2014   Chest pain 05/01/2014   GERD (gastroesophageal reflux disease) 03/22/2014   S/P gastric bypass 03/22/2014   Insomnia 03/22/2014   Migraine 03/22/2014   Palpitations 03/22/2014   Past Medical History:  Diagnosis Date   Allergy    Anemia    Anxiety    Arthritis pain 08/20/2014   Back pain    COVID-19    GERD (gastroesophageal reflux  disease)    History of stress test 02/2011 ([facility])   there was no evidence of ischemia, but she only went 3 1/2 minutes on the treadmill making it very difficult to get a good accurate assessment, however   Hx of echocardiogram    The echocadiogram was essentially normal with the exception of mild mitral calcification and borderline concentric LVH, which in the setting of her hypertension at this early age is something that mean her blood pressure is well controlled.    Hypertension    Iron deficiency anemia, unspecified 07/09/2014   Migraines    Palpitations    Swelling    Vitamin B12 deficiency 03/17/2016     Family History  Problem Relation Age of Onset   Asthma Mother    Heart disease Mother    Diabetes Mother    Early death Mother    Hypertension Mother    Thyroid  disease Mother    Anxiety disorder Mother    Hypertension Father    Heart disease Father    Breast cancer Sister 12   Breast cancer Maternal Aunt        under 53   Breast cancer Paternal Aunt    Stroke Maternal Grandmother    Heart disease Maternal Grandmother    Hypertension Maternal Grandmother    Hyperlipidemia Maternal Grandmother    Breast cancer Maternal Grandmother     Past Surgical History:  Procedure Laterality Date   BREAST SURGERY     CARPAL TUNNEL RELEASE Right 09/24/2021   Procedure: RIGHT CARPAL TUNNEL RELEASE;  Surgeon: Jerri Kay HERO, MD;  Location: [facility];  Service: Orthopedics;  Laterality: Right;   CHOLECYSTECTOMY     GASTRIC BYPASS  2008   OOPHORECTOMY Right 2010   REDUCTION MAMMAPLASTY Bilateral    Social History   Occupational History   Occupation: Human Resources Officer  Tobacco Use   Smoking status: Never   Smokeless tobacco: Never  Vaping Use   Vaping status: Never Used  Substance and Sexual Activity   Alcohol use: No   Drug use: No   Sexual activity: Yes

## 2023-10-28 ENCOUNTER — Inpatient Hospital Stay: Payer: 59

## 2023-10-30 ENCOUNTER — Inpatient Hospital Stay: Payer: 59

## 2023-10-31 ENCOUNTER — Other Ambulatory Visit: Payer: Self-pay | Admitting: Orthopaedic Surgery

## 2023-11-01 ENCOUNTER — Telehealth: Payer: Self-pay | Admitting: Orthopaedic Surgery

## 2023-11-01 ENCOUNTER — Other Ambulatory Visit: Payer: Self-pay | Admitting: Physician Assistant

## 2023-11-01 MED ORDER — TRAMADOL HCL 50 MG PO TABS: 50.0000 mg | ORAL_TABLET | Freq: Three times a day (TID) | ORAL | 0 refills | Status: AC | PRN

## 2023-11-01 NOTE — Telephone Encounter (Signed)
Not sure which medications she wants, but assume norco?  cannot refill that so I sent in tramadol

## 2023-11-01 NOTE — Telephone Encounter (Signed)
Patient called. She would like a refill on her medications called in.

## 2023-11-01 NOTE — Telephone Encounter (Signed)
Called and notified patient.

## 2023-11-06 ENCOUNTER — Inpatient Hospital Stay: Payer: 59

## 2023-11-12 ENCOUNTER — Other Ambulatory Visit: Payer: Self-pay

## 2023-11-12 ENCOUNTER — Telehealth: Payer: Self-pay | Admitting: Orthopaedic Surgery

## 2023-11-12 DIAGNOSIS — M65311 Trigger thumb, right thumb: Secondary | ICD-10-CM

## 2023-11-12 NOTE — Telephone Encounter (Signed)
Referral has been placed for [facility].

## 2023-11-12 NOTE — Telephone Encounter (Signed)
Patient called asked if she can have (PT) at [facility] on [address]. Patient said she called and spoke with someone 11/08/2023 and left this request for Dr. Roda Shutters.  Patient said she had her Physical Therapy at [facility] the last time she had surgery.  The number to contact patient is [phone]

## 2023-11-13 ENCOUNTER — Inpatient Hospital Stay: Payer: 59 | Attending: Hematology and Oncology

## 2023-11-13 VITALS — BP 101/77 | HR 60 | Temp 97.9°F | Resp 17

## 2023-11-13 DIAGNOSIS — D508 Other iron deficiency anemias: Secondary | ICD-10-CM

## 2023-11-13 DIAGNOSIS — Z79899 Other long term (current) drug therapy: Secondary | ICD-10-CM | POA: Insufficient documentation

## 2023-11-13 DIAGNOSIS — D509 Iron deficiency anemia, unspecified: Secondary | ICD-10-CM | POA: Diagnosis present

## 2023-11-13 DIAGNOSIS — E538 Deficiency of other specified B group vitamins: Secondary | ICD-10-CM | POA: Diagnosis present

## 2023-11-13 MED ORDER — CYANOCOBALAMIN 1000 MCG/ML IJ SOLN
1000.0000 ug | Freq: Once | INTRAMUSCULAR | Status: AC
Start: 2023-11-13 — End: 2023-11-13
  Administered 2023-11-13: 1000 ug via INTRAMUSCULAR
  Filled 2023-11-13: qty 1

## 2023-11-16 ENCOUNTER — Other Ambulatory Visit: Payer: Self-pay

## 2023-11-16 ENCOUNTER — Telehealth: Payer: Self-pay

## 2023-11-16 DIAGNOSIS — M65311 Trigger thumb, right thumb: Secondary | ICD-10-CM

## 2023-11-16 NOTE — Telephone Encounter (Signed)
[facility] calling stating they can't or don't have OT there for her thumb but they can do PT on her thumb but need new order faxed to [phone]

## 2023-11-16 NOTE — Telephone Encounter (Signed)
New order has been entered. 

## 2023-11-20 ENCOUNTER — Inpatient Hospital Stay: Payer: 59

## 2023-11-27 ENCOUNTER — Inpatient Hospital Stay: Payer: 59

## 2023-12-04 ENCOUNTER — Inpatient Hospital Stay: Payer: 59

## 2023-12-07 ENCOUNTER — Other Ambulatory Visit (INDEPENDENT_AMBULATORY_CARE_PROVIDER_SITE_OTHER): Payer: 59

## 2023-12-07 ENCOUNTER — Encounter: Payer: Self-pay | Admitting: Hematology and Oncology

## 2023-12-07 ENCOUNTER — Ambulatory Visit (INDEPENDENT_AMBULATORY_CARE_PROVIDER_SITE_OTHER): Payer: 59 | Admitting: Orthopaedic Surgery

## 2023-12-07 ENCOUNTER — Encounter: Payer: Self-pay | Admitting: Orthopaedic Surgery

## 2023-12-07 VITALS — BP 107/63 | HR 79 | Ht 59.0 in | Wt 183.0 lb

## 2023-12-07 DIAGNOSIS — M545 Low back pain, unspecified: Secondary | ICD-10-CM | POA: Diagnosis not present

## 2023-12-07 DIAGNOSIS — G8929 Other chronic pain: Secondary | ICD-10-CM

## 2023-12-07 NOTE — Progress Notes (Unsigned)
 Office Visit Note   Patient: Meredith Byrd           Date of Birth: 10-01-1981           MRN: [id number] Visit Date: 12/07/2023              Requested by: Irena Reichmann, DO [address] [place],  [place] [address] PCP: Irena Reichmann, DO   Assessment & Plan: Visit Diagnoses:  1. Chronic bilateral low back pain, unspecified whether sciatica present     Plan: Patient is already going to therapy for her knee will add some lumbar treatments at [facility] low back pain with right buttocks pain evaluation and treat.  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  If she is still having problems after 6 weeks of therapy then lumbar MRI scan can be ordered.  Follow-Up Instructions: No follow-ups on file.   Orders:  Orders Placed This Encounter  Procedures   XR Lumbar Spine 2-3 Views   No orders of the defined types were placed in this encounter.     Procedures: No procedures performed   Clinical Data: No additional findings.   Subjective: Chief Complaint  Patient presents with   Lower Back - Pain    HPI 43 year old female with low back pain greater than a year.  She states it radiates a little bit to the right side pain worse with sitting or standing.  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  She has had previous facet injections in the past she states it worked for 6 months.  She states she had some sciatica in the past but currently does not have those symptoms.  She has had a gastric bypass procedure and cannot take anti-inflammatories.  She is currently going to therapy at [facility] for her knee on [address]  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  No recent lumbar imaging studies.  No fever chills no bowel bladder associated symptoms.  Review of Systems past infections were  lumbar facet arthropathy knee arthritis carpal tunnel syndrome previous gastric bypass procedure history of palpitations and migraines.  All systems noncontributory to HPI.   Objective: Vital Signs: BP 107/63   Pulse 79   Ht 4\' 11"  (1.499 m)   Wt 183 lb (83 kg)   BMI 36.96 kg/m   Physical Exam Constitutional:      Appearance: She is well-developed.  HENT:     Head: Normocephalic.     Right Ear: External ear normal.     Left Ear: External ear normal. There is no impacted cerumen.  Eyes:     Pupils: Pupils are equal, round, and reactive to light.  Neck:     Thyroid: No thyromegaly.     Trachea: No tracheal deviation.  Cardiovascular:     Rate and Rhythm: Normal rate.  Pulmonary:     Effort: Pulmonary effort is normal.  Abdominal:     Palpations: Abdomen is soft.  Musculoskeletal:     Cervical back: No rigidity.  Skin:    General: Skin is warm and dry.  Neurological:     Mental Status: She is alert and oriented to person, place, and time.  Psychiatric:        Behavior: Behavior normal.     Ortho Exam negative  straight leg raising reflexes are intact negative logroll hips.  Specialty Comments:  No specialty comments available.  Imaging: No results found.   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   On Depo-Provera for contraception 05/31/2019   Chronic insomnia 05/17/2019   Lumbar facet arthropathy 12/14/2018   Dysmenorrhea, unspecified 05/16/2018   Backache 01/26/2018   Knee pain, left 11/17/2017   Generalized anxiety disorder 10/04/2017   Morning headache 05/04/2017   Daytime somnolence 05/04/2017   Class 3 obesity with body mass index (BMI) of  45.0 to 49.9 in adult 03/28/2017   Chronic low back pain 01/19/2017   Vitamin D deficiency 01/18/2017   Vitamin B12 deficiency 03/17/2016   Beta thalassemia trait 03/25/2015   Intractable migraine without aura and without status migrainosus 11/23/2014   Elevated sed rate 08/20/2014   Arthritis pain 08/20/2014   Iron deficiency anemia 07/09/2014   Symptomatic anemia 06/05/2014   Chest pain 05/01/2014   GERD (gastroesophageal reflux disease) 03/22/2014   S/P gastric bypass 03/22/2014   Insomnia 03/22/2014   Migraine 03/22/2014   Palpitations 03/22/2014   Past Medical History:  Diagnosis Date   Allergy    Anemia    Anxiety    Arthritis pain 08/20/2014   Back pain    COVID-19    GERD (gastroesophageal reflux disease)    History of stress test 02/2011 ([facility])   there was no evidence of ischemia, but she only went 3 1/2 minutes on the treadmill making it very difficult to get a good accurate assessment, however   Hx of echocardiogram    The echocadiogram was essentially normal with the exception of mild mitral calcification and borderline concentric LVH, which in the setting of her hypertension at this early age is something that mean her blood pressure is well controlled.    Hypertension    Iron deficiency anemia, unspecified 07/09/2014   Migraines    Palpitations    Swelling    Vitamin B12 deficiency 03/17/2016    Family History  Problem Relation Age of Onset   Asthma Mother    Heart disease Mother    Diabetes Mother    Early death Mother    Hypertension Mother    Thyroid disease Mother    Anxiety disorder Mother    Hypertension Father    Heart disease Father    Breast cancer Sister 54   Breast cancer Maternal Aunt        under 83   Breast cancer Paternal Aunt    Stroke Maternal Grandmother    Heart disease Maternal Grandmother    Hypertension Maternal Grandmother    Hyperlipidemia Maternal Grandmother    Breast cancer Maternal Grandmother     Past Surgical History:   Procedure Laterality Date   BREAST SURGERY     CARPAL TUNNEL RELEASE Right 09/24/2021   Procedure: RIGHT CARPAL TUNNEL RELEASE;  Surgeon: Tarry Kos, MD;  Location: [facility];  Service: Orthopedics;  Laterality: Right;   CHOLECYSTECTOMY     GASTRIC BYPASS  2008   OOPHORECTOMY Right 2010   REDUCTION MAMMAPLASTY Bilateral    Social History   Occupational History   Occupation: Human resources officer  Tobacco Use  Smoking status: Never   Smokeless tobacco: Never  Vaping Use   Vaping status: Never Used  Substance and Sexual Activity   Alcohol use: No   Drug use: No   Sexual activity: Yes

## 2023-12-11 ENCOUNTER — Inpatient Hospital Stay: Payer: 59 | Attending: Hematology and Oncology

## 2023-12-11 DIAGNOSIS — E538 Deficiency of other specified B group vitamins: Secondary | ICD-10-CM | POA: Insufficient documentation

## 2023-12-11 DIAGNOSIS — Z9884 Bariatric surgery status: Secondary | ICD-10-CM | POA: Insufficient documentation

## 2023-12-11 DIAGNOSIS — Z79899 Other long term (current) drug therapy: Secondary | ICD-10-CM | POA: Insufficient documentation

## 2023-12-11 DIAGNOSIS — D563 Thalassemia minor: Secondary | ICD-10-CM | POA: Insufficient documentation

## 2023-12-11 DIAGNOSIS — D509 Iron deficiency anemia, unspecified: Secondary | ICD-10-CM | POA: Insufficient documentation

## 2023-12-18 ENCOUNTER — Inpatient Hospital Stay: Payer: 59

## 2023-12-25 ENCOUNTER — Inpatient Hospital Stay: Payer: 59

## 2023-12-25 VITALS — BP 116/79 | HR 64 | Temp 97.0°F | Resp 16

## 2023-12-25 DIAGNOSIS — E538 Deficiency of other specified B group vitamins: Secondary | ICD-10-CM | POA: Diagnosis present

## 2023-12-25 DIAGNOSIS — D509 Iron deficiency anemia, unspecified: Secondary | ICD-10-CM | POA: Diagnosis present

## 2023-12-25 DIAGNOSIS — D563 Thalassemia minor: Secondary | ICD-10-CM | POA: Diagnosis not present

## 2023-12-25 DIAGNOSIS — Z9884 Bariatric surgery status: Secondary | ICD-10-CM | POA: Diagnosis not present

## 2023-12-25 DIAGNOSIS — D508 Other iron deficiency anemias: Secondary | ICD-10-CM

## 2023-12-25 DIAGNOSIS — Z79899 Other long term (current) drug therapy: Secondary | ICD-10-CM | POA: Diagnosis not present

## 2023-12-25 MED ORDER — CYANOCOBALAMIN 1000 MCG/ML IJ SOLN
1000.0000 ug | Freq: Once | INTRAMUSCULAR | Status: AC
  Administered 2023-12-25: 1000 ug via INTRAMUSCULAR
  Filled 2023-12-25: qty 1

## 2023-12-29 ENCOUNTER — Telehealth: Payer: Self-pay | Admitting: Orthopaedic Surgery

## 2023-12-29 ENCOUNTER — Encounter: Payer: Self-pay | Admitting: Orthopaedic Surgery

## 2023-12-29 NOTE — Telephone Encounter (Signed)
 Note entered. My Chart message sent as well. Will message patient back there.

## 2023-12-29 NOTE — Telephone Encounter (Signed)
 Pt states the work accomodation letter she was given needs to be updates. Pt needs for the the letter to have a end date or say permanent.  Pt request a call back .

## 2024-01-03 ENCOUNTER — Inpatient Hospital Stay: Payer: 59

## 2024-01-03 ENCOUNTER — Inpatient Hospital Stay: Payer: 59 | Admitting: Hematology and Oncology

## 2024-01-10 ENCOUNTER — Other Ambulatory Visit: Payer: Self-pay | Admitting: Hematology and Oncology

## 2024-01-10 ENCOUNTER — Inpatient Hospital Stay: Payer: 59

## 2024-01-10 ENCOUNTER — Inpatient Hospital Stay (HOSPITAL_BASED_OUTPATIENT_CLINIC_OR_DEPARTMENT_OTHER): Payer: 59 | Admitting: Hematology and Oncology

## 2024-01-10 VITALS — BP 115/62 | HR 64 | Temp 98.2°F | Resp 13 | Wt 192.3 lb

## 2024-01-10 DIAGNOSIS — D508 Other iron deficiency anemias: Secondary | ICD-10-CM

## 2024-01-10 DIAGNOSIS — E538 Deficiency of other specified B group vitamins: Secondary | ICD-10-CM

## 2024-01-10 DIAGNOSIS — D563 Thalassemia minor: Secondary | ICD-10-CM | POA: Diagnosis not present

## 2024-01-10 DIAGNOSIS — D509 Iron deficiency anemia, unspecified: Secondary | ICD-10-CM | POA: Diagnosis not present

## 2024-01-10 LAB — IRON AND IRON BINDING CAPACITY (CC-WL,HP ONLY)
Iron: 52 ug/dL (ref 28–170)
Saturation Ratios: 14 % (ref 10.4–31.8)
TIBC: 368 ug/dL (ref 250–450)
UIBC: 316 ug/dL (ref 148–442)

## 2024-01-10 LAB — CMP (CANCER CENTER ONLY)
ALT: 26 U/L (ref 0–44)
AST: 19 U/L (ref 15–41)
Albumin: 4.1 g/dL (ref 3.5–5.0)
Alkaline Phosphatase: 59 U/L (ref 38–126)
Anion gap: 6 (ref 5–15)
BUN: 8 mg/dL (ref 6–20)
CO2: 27 mmol/L (ref 22–32)
Calcium: 8.9 mg/dL (ref 8.9–10.3)
Chloride: 107 mmol/L (ref 98–111)
Creatinine: 0.82 mg/dL (ref 0.44–1.00)
GFR, Estimated: >60 mL/min (ref >60)
Glucose, Bld: 92 mg/dL (ref 70–99)
Potassium: 3.9 mmol/L (ref 3.5–5.1)
Sodium: 140 mmol/L (ref 135–145)
Total Bilirubin: 0.4 mg/dL (ref 0.0–1.2)
Total Protein: 7.2 g/dL (ref 6.5–8.1)

## 2024-01-10 LAB — CBC WITH DIFFERENTIAL (CANCER CENTER ONLY)
Abs Immature Granulocytes: 0.02 10*3/uL (ref 0.00–0.07)
Basophils Absolute: 0 10*3/uL (ref 0.0–0.1)
Basophils Relative: 0 %
Eosinophils Absolute: 0.1 10*3/uL (ref 0.0–0.5)
Eosinophils Relative: 1 %
HCT: 36.6 % (ref 36.0–46.0)
Hemoglobin: 11.4 g/dL — ABNORMAL LOW (ref 12.0–15.0)
Immature Granulocytes: 0 %
Lymphocytes Relative: 31 %
Lymphs Abs: 1.4 10*3/uL (ref 0.7–4.0)
MCH: 25.1 pg — ABNORMAL LOW (ref 26.0–34.0)
MCHC: 31.1 g/dL (ref 30.0–36.0)
MCV: 80.6 fL (ref 80.0–100.0)
Monocytes Absolute: 0.3 10*3/uL (ref 0.1–1.0)
Monocytes Relative: 6 %
Neutro Abs: 2.7 10*3/uL (ref 1.7–7.7)
Neutrophils Relative %: 62 %
Platelet Count: 452 10*3/uL — ABNORMAL HIGH (ref 150–400)
RBC: 4.54 MIL/uL (ref 3.87–5.11)
RDW: 15.8 % — ABNORMAL HIGH (ref 11.5–15.5)
WBC Count: 4.5 10*3/uL (ref 4.0–10.5)
nRBC: 0 % (ref 0.0–0.2)

## 2024-01-10 LAB — RETIC PANEL
Immature Retic Fract: 16.6 % — ABNORMAL HIGH (ref 2.3–15.9)
RBC.: 4.53 MIL/uL (ref 3.87–5.11)
Retic Count, Absolute: 56.6 10*3/uL (ref 19.0–186.0)
Retic Ct Pct: 1.3 % (ref 0.4–3.1)
Reticulocyte Hemoglobin: 26 pg — ABNORMAL LOW (ref >27.9)

## 2024-01-10 LAB — VITAMIN B12: Vitamin B-12: 526 pg/mL (ref 180–914)

## 2024-01-10 MED ORDER — CYANOCOBALAMIN 1000 MCG/ML IJ SOLN
1000.0000 ug | Freq: Once | INTRAMUSCULAR | Status: AC
  Administered 2024-01-10: 1000 ug via INTRAMUSCULAR
  Filled 2024-01-10: qty 1

## 2024-01-10 NOTE — Progress Notes (Signed)
 [facility] [phone]   [phone]  PROGRESS NOTE  Patient Care Team: Irena Reichmann, DO as PCP - General (Family Medicine) Meisinger, Tawanna Cooler, MD as Consulting Physician (Obstetrics and Gynecology) Jaci Standard, MD as Consulting Physician (Hematology and Oncology)  Hematological/Oncological History # Iron Deficiency Anemia 2/2 to Gastric Bipass Surgery 10/08/2009: WBC 5.8, Hgb 10.2, MCV 73.1, Plt 458 03/25/2015: WBC 7.0, Hgb 12.5, MCV 77.2, Plt 419 03/17/2016: WBC 3.8, Hgb 10.9, Plt 366, MCV 77.1 05/28/2020: WBC 4.1, Hgb 10.3, MCV 75.2, Plt 517 09/23/2020: establish care with Dr. Leonides Schanz  12/20-12/27/2021: IV feraheme 510mg  q 7 days x 2 doses and 2 doses of IM Vitamin b12 .  12/26/2020: WBC 4.3, Hgb 11.8, MCV 79.4, Plt 432 01/09/2022: Wbc 5.9, Hgb 12.1, MCV 77.2, Plt 544 09/28/2022: WBC 4.7, Hgb 11.2, MCV 79.2, Plt 365 12/25/2022: WBC 4.5, Hgb 11.2, MCV 79.2, Plt 454  03/26/2023: WBC 4.5, Hgb 11.2, MCV 79.8, Plt 471  Interval History:  Charlett M Keach 43 y.o. female with medical history significant for iron deficiency anemia 2/2 to gastric bipass surgery who presents for a follow up visit. The patient's last visit was on 10/04/2023. In the interim since the last visit she has had no major changes in her health.   On exam today Mrs. Reiber reports she has been well overall interim since her last visit with no major changes in her health.  She reports she has had no hospitalizations or medication changes.  She has had no viral illnesses.  She reports her energy is about the same, currently 6 out of 10.  She notes that she has not had a recent couple with nausea, Oni, or diarrhea.  She reports she continues to receive her B12 shots every 2 weeks but has not noticed a big boost in her energy levels.  She denies any bleeding and reports that she is on Depo shots and does not have menstrual cycles, though we will be stopping her Depo shots shortly.   She notes that she has had no recent issues with lightheadedness, dizziness, or shortness of breath.  Overall she has no changes in her health and is at her baseline.  She denies fevers, chills, shortness of breath, chest pain or cough. She is had no other changes in her health at this time.  A full 10 point ROS is listed below.  MEDICAL HISTORY:  Past Medical History:  Diagnosis Date   Allergy    Anemia    Anxiety    Arthritis pain 08/20/2014   Back pain    COVID-19    GERD (gastroesophageal reflux disease)    History of stress test 02/2011 ([facility])   there was no evidence of ischemia, but she only went 3 1/2 minutes on the treadmill making it very difficult to get a good accurate assessment, however   Hx of echocardiogram    The echocadiogram was essentially normal with the exception of mild mitral calcification and borderline concentric LVH, which in the setting of her hypertension at this early age is something that mean her blood pressure is well controlled.    Hypertension    Iron deficiency anemia, unspecified 07/09/2014   Migraines    Palpitations    Swelling    Vitamin B12 deficiency 03/17/2016    SURGICAL HISTORY: Past Surgical History:  Procedure Laterality Date   BREAST SURGERY     CARPAL TUNNEL RELEASE Right 09/24/2021   Procedure: RIGHT CARPAL TUNNEL RELEASE;  Surgeon: Gershon Mussel  M, MD;  Location: [facility];  Service: Orthopedics;  Laterality: Right;   CHOLECYSTECTOMY     GASTRIC BYPASS  2008   OOPHORECTOMY Right 2010   REDUCTION MAMMAPLASTY Bilateral     SOCIAL HISTORY: Social History   Socioeconomic History   Marital status: Married    Spouse name: Damon   Number of children: 4   Years of education: Associates   Highest education level: Not on file  Occupational History   Occupation: Insurance Claims  Tobacco Use   Smoking status: Never   Smokeless tobacco: Never  Vaping Use   Vaping status: Never Used  Substance and Sexual Activity    Alcohol use: No   Drug use: No   Sexual activity: Yes  Other Topics Concern   Not on file  Social History Narrative   Right handed.   Lives at home with husband and four children.   No caffeine use.   Social Drivers of Corporate investment banker Strain: Not on file  Food Insecurity: Not on file  Transportation Needs: Not on file  Physical Activity: Not on file  Stress: Not on file  Social Connections: Not on file  Intimate Partner Violence: Not on file    FAMILY HISTORY: Family History  Problem Relation Age of Onset   Asthma Mother    Heart disease Mother    Diabetes Mother    Early death Mother    Hypertension Mother    Thyroid disease Mother    Anxiety disorder Mother    Hypertension Father    Heart disease Father    Breast cancer Sister 58   Breast cancer Maternal Aunt        under 78   Breast cancer Paternal Aunt    Stroke Maternal Grandmother    Heart disease Maternal Grandmother    Hypertension Maternal Grandmother    Hyperlipidemia Maternal Grandmother    Breast cancer Maternal Grandmother     ALLERGIES:  is allergic to doxycycline, fioricet [butalbital-apap-caffeine], inderal [propranolol], and nsaids.  MEDICATIONS:  Current Outpatient Medications  Medication Sig Dispense Refill   acetaminophen (TYLENOL) 500 MG tablet Take 2 tablets (1,000 mg total) by mouth every 6 (six) hours as needed. 30 tablet 0   acetaminophen-codeine (TYLENOL #3) 300-30 MG tablet Take 1 tablet by mouth 2 (two) times daily as needed for moderate pain. 30 tablet 0   albuterol (VENTOLIN HFA) 108 (90 Base) MCG/ACT inhaler Inhale 1-2 puffs into the lungs every 6 (six) hours as needed for wheezing or shortness of breath. 18 g 0   cloNIDine (CATAPRES) 0.2 MG tablet Take 0.4 mg by mouth at bedtime.     Cyanocobalamin (B-12 COMPLIANCE INJECTION) 1000 MCG/ML KIT Inject as directed.     Galcanezumab-gnlm (EMGALITY) 120 MG/ML SOSY 240 mg subQ (2 consecutive 120-mg doses) once as a loading  dose, followed by 120 mg once monthly 3 mL 6   HYDROcodone-acetaminophen (NORCO) 5-325 MG tablet Take 1 tablet by mouth 2 (two) times daily as needed. 10 tablet 0   medroxyPROGESTERone (DEPO-PROVERA) 150 MG/ML injection      montelukast (SINGULAIR) 10 MG tablet Take 10 mg by mouth at bedtime.     ondansetron (ZOFRAN) 4 MG tablet Take 1-2 tablets (4-8 mg total) by mouth every 8 (eight) hours as needed for nausea or vomiting. 10 tablet 0   traMADol (ULTRAM) 50 MG tablet Take 1 tablet (50 mg total) by mouth daily as needed. 20 tablet 0   traMADol (ULTRAM) 50  MG tablet Take 1 tablet (50 mg total) by mouth 3 (three) times daily as needed. 30 tablet 0   Ubrogepant (UBRELVY) 100 MG TABS Take 1 tablet at the onset of migraine. Can repeat in 2 hours if needed. Only 2 tabs/24 hours 12 tablet 11   UNABLE TO FIND Med Name: allergy injections  3 times a week     verapamil (CALAN-SR) 120 MG CR tablet Take 1 tablet (120 mg total) by mouth at bedtime. 30 tablet 11   Vitamin D, Ergocalciferol, (DRISDOL) 1.25 MG (50000 UNIT) CAPS capsule TAKE 1 CAPSULE BY MOUTH EVERY 7 DAYS 12 capsule 0   No current facility-administered medications for this visit.    REVIEW OF SYSTEMS:   Constitutional: ( - ) fevers, ( - )  chills , ( - ) night sweats Eyes: ( - ) blurriness of vision, ( - ) double vision, ( - ) watery eyes Ears, nose, mouth, throat, and face: ( - ) mucositis, ( - ) sore throat Respiratory: ( - ) cough, ( - ) dyspnea, ( - ) wheezes Cardiovascular: ( - ) palpitation, ( - ) chest discomfort, ( - ) lower extremity swelling Gastrointestinal:  ( - ) nausea, ( - ) heartburn, ( - ) change in bowel habits Skin: ( - ) abnormal skin rashes Lymphatics: ( - ) new lymphadenopathy, ( - ) easy bruising Neurological: ( - ) numbness, ( - ) tingling, ( - ) new weaknesses Behavioral/Psych: ( - ) mood change, ( - ) new changes  All other systems were reviewed with the patient and are negative.  PHYSICAL  EXAMINATION:  Vitals:   01/10/24 1539  BP: 115/62  Pulse: 64  Resp: 13  Temp: 98.2 F (36.8 C)  SpO2: 100%       Filed Weights   01/10/24 1539  Weight: 192 lb 4.8 oz (87.2 kg)      GENERAL: tired appearing middle aged [place] American female. alert, no distress and comfortable SKIN: skin color, texture, turgor are normal, no rashes or significant lesions EYES: conjunctiva are pink and non-injected, sclera clear LUNGS: clear to auscultation and percussion with normal breathing effort HEART: regular rate & rhythm and no murmurs and no lower extremity edema Musculoskeletal: no cyanosis of digits and no clubbing  PSYCH: alert & oriented x 3, fluent speech NEURO: no focal motor/sensory deficits  LABORATORY DATA:  I have reviewed the data as listed    Latest Ref Rng & Units 01/10/2024    3:04 PM 10/04/2023    2:24 PM 07/01/2023    9:29 AM  CBC  WBC 4.0 - 10.5 K/uL 4.5  6.1  4.4   Hemoglobin 12.0 - 15.0 g/dL 14.7  82.9  56.2   Hematocrit 36.0 - 46.0 % 36.6  36.8  34.8   Platelets 150 - 400 K/uL 452  425  388        Latest Ref Rng & Units 01/10/2024    3:04 PM 10/04/2023    2:24 PM 07/01/2023    9:29 AM  CMP  Glucose 70 - 99 mg/dL 92  98  84   BUN 6 - 20 mg/dL 8  6  9    Creatinine 0.44 - 1.00 mg/dL 1.30  8.65  7.84   Sodium 135 - 145 mmol/L 140  139  141   Potassium 3.5 - 5.1 mmol/L 3.9  3.6  4.0   Chloride 98 - 111 mmol/L 107  106  109   CO2 22 - 32  mmol/L 27  25  28    Calcium 8.9 - 10.3 mg/dL 8.9  9.4  9.0   Total Protein 6.5 - 8.1 g/dL 7.2  7.2  7.1   Total Bilirubin 0.0 - 1.2 mg/dL 0.4  0.4  0.4   Alkaline Phos 38 - 126 U/L 59  66  57   AST 15 - 41 U/L 19  36  22   ALT 0 - 44 U/L 26  59  26     RADIOGRAPHIC STUDIES: No results found.  ASSESSMENT & PLAN Jennier M Venters 43 y.o. female with medical history significant for iron deficiency anemia 2/2 to gastric bipass surgery who presents for a follow up visit.  # Iron Deficiency Anemia 2/2 to Gastric  Bipass Surgery #Vitamin B12 Deficiency 2/2 to Gastric Bipass Surgery --Findings are most consistent with iron deficiency anemia/Vitamin B12 deficiency secondary to poor absorption in the setting of gastric bypass surgery.  -- labs today show WBC 4.5, hemoglobin 1.4, MCV 80.6, platelets 452.  Iron studies pending. --Currently receiving IM B12 injection 1000 mcg q 2 weeks. Recommend to continue. -- If iron levels are low will plan to proceed with IV iron. --Return to clinic in 3 months with interval vitamin B12 injections every 2 weeks.  #Fatigue, stable --Patient still has marked fatigue despite adequate repletion of iron and vitamin B12.   --normal TSH and hemoglobin A1c from 12/26/2020 --Folate levels have normalized from 03/21/2021 --H/O of vitamin D deficiency so currently takes vitamin D supplementation.    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   No orders of the defined types were placed in this encounter.  All questions were answered. The patient knows to call the clinic with any problems, questions or concerns.  I have spent a total of 30 minutes minutes of face-to-face and non-face-to-face time, preparing to see the patient, performing a medically appropriate examination, counseling and educating the patient,  documenting clinical information in the electronic health record, and care coordination.   Ulysees Barns, MD Department of Hematology/Oncology [facility] Phone: [phone] Pager: [phone] Email: [email or web address]   01/10/2024 4:25 PM

## 2024-01-11 LAB — FERRITIN: Ferritin: 242 ng/mL (ref 11–307)

## 2024-01-14 LAB — METHYLMALONIC ACID, SERUM: Methylmalonic Acid, Quantitative: 108 nmol/L (ref 0–378)

## 2024-01-18 ENCOUNTER — Telehealth: Payer: Self-pay | Admitting: Orthopaedic Surgery

## 2024-01-18 NOTE — Telephone Encounter (Signed)
 If it's concerning her back then she should see megan and let her decide.  Thanks.

## 2024-01-18 NOTE — Telephone Encounter (Signed)
 Patient called and wanted to go ahead and send the order to have her MRI. Dr. Ophelia Charter was her doctor and xu seen her for her back. After she had the PT she now can do the MRI. CB#[phone]

## 2024-01-20 ENCOUNTER — Telehealth: Payer: Self-pay | Admitting: Radiology

## 2024-01-20 ENCOUNTER — Other Ambulatory Visit: Payer: Self-pay | Admitting: Physical Medicine and Rehabilitation

## 2024-01-20 DIAGNOSIS — M545 Low back pain, unspecified: Secondary | ICD-10-CM

## 2024-01-20 NOTE — Telephone Encounter (Signed)
 I called patient advised, pending MRI appt now.

## 2024-01-20 NOTE — Telephone Encounter (Signed)
 This is a patient of Dr Ophelia Charter'.  He instructed her even though he was retiring, her care would not be interrupted, that our office would take care of her.  She has completed some PT, etc, and is still having problems.  Per Dr Ophelia Charter' note, MRI is next step.  She sees Dr Roda Shutters for other problems.  Xu recommends she see you for her back.  Can you review the chart and based on the previous treatments order the MRI and you follow up and review with her once done?  Patient is frustrated, feels like she is going backwards if she can't just go ahead and get the MRI at the point since conservative treatment completed.  Please advise and thank you.

## 2024-01-29 ENCOUNTER — Inpatient Hospital Stay: Attending: Hematology and Oncology

## 2024-01-29 VITALS — BP 121/77 | HR 63 | Temp 99.0°F | Resp 16

## 2024-01-29 DIAGNOSIS — E538 Deficiency of other specified B group vitamins: Secondary | ICD-10-CM | POA: Diagnosis present

## 2024-01-29 DIAGNOSIS — D508 Other iron deficiency anemias: Secondary | ICD-10-CM

## 2024-01-29 DIAGNOSIS — D509 Iron deficiency anemia, unspecified: Secondary | ICD-10-CM | POA: Diagnosis present

## 2024-01-29 MED ORDER — CYANOCOBALAMIN 1000 MCG/ML IJ SOLN
1000.0000 ug | Freq: Once | INTRAMUSCULAR | Status: AC
  Administered 2024-01-29: 1000 ug via INTRAMUSCULAR
  Filled 2024-01-29: qty 1

## 2024-02-02 ENCOUNTER — Ambulatory Visit
Admission: RE | Admit: 2024-02-02 | Discharge: 2024-02-02 | Disposition: A | Discharge status: Home / Self Care | Source: Ambulatory Visit | Attending: Physical Medicine and Rehabilitation | Admitting: Physical Medicine and Rehabilitation

## 2024-02-02 DIAGNOSIS — M545 Low back pain, unspecified: Secondary | ICD-10-CM

## 2024-02-12 ENCOUNTER — Inpatient Hospital Stay: Attending: Hematology and Oncology

## 2024-02-12 VITALS — BP 119/73 | HR 76 | Temp 97.9°F | Resp 17

## 2024-02-12 DIAGNOSIS — D508 Other iron deficiency anemias: Secondary | ICD-10-CM

## 2024-02-12 DIAGNOSIS — D509 Iron deficiency anemia, unspecified: Secondary | ICD-10-CM | POA: Diagnosis present

## 2024-02-12 DIAGNOSIS — E538 Deficiency of other specified B group vitamins: Secondary | ICD-10-CM | POA: Diagnosis present

## 2024-02-12 DIAGNOSIS — Z79899 Other long term (current) drug therapy: Secondary | ICD-10-CM | POA: Diagnosis not present

## 2024-02-12 MED ORDER — CYANOCOBALAMIN 1000 MCG/ML IJ SOLN
1000.0000 ug | Freq: Once | INTRAMUSCULAR | Status: AC
  Administered 2024-02-12: 1000 ug via INTRAMUSCULAR

## 2024-02-12 NOTE — Patient Instructions (Signed)
 Vitamin B12 Deficiency Vitamin B12 deficiency means that your body does not have enough vitamin B12. The body needs this important vitamin: To make red blood cells. To make genes (DNA). To help the nerves work. If you do not have enough vitamin B12 in your body, you can have health problems, such as not having enough red blood cells in the blood (anemia). What are the causes? Not eating enough foods that contain vitamin B12. Not being able to take in (absorb) vitamin B12 from the food that you eat. Certain diseases. A condition in which the body does not make enough of a certain protein. This results in your body not taking in enough vitamin B12. Having a surgery in which part of the stomach or small intestine is taken out. Taking medicines that make it hard for the body to take in vitamin B12. These include: Heartburn medicines. Some medicines that are used to treat diabetes. What increases the risk? Being an older adult. Eating a vegetarian or vegan diet that does not include any foods that come from animals. Not eating enough foods that contain vitamin B12 while you are pregnant. Taking certain medicines. Having alcoholism. What are the signs or symptoms? In some cases, there are no symptoms. If the condition leads to too few blood cells or nerve damage, symptoms can occur, such as: Feeling weak or tired. Not being hungry. Losing feeling (numbness) or tingling in your hands and feet. Redness and burning of the tongue. Feeling sad (depressed). Confusion or memory problems. Trouble walking. If anemia is very bad, symptoms can include: Being short of breath. Being dizzy. Having a very fast heartbeat. How is this treated? Changing the way you eat and drink, such as: Eating more foods that contain vitamin B12. Drinking little or no alcohol. Getting vitamin B12 shots. Taking vitamin B12 supplements by mouth (orally). Your doctor will tell you the dose that is best for you. Follow  these instructions at home: Eating and drinking  Eat foods that come from animals and have a lot of vitamin B12 in them. These include: Meats and poultry. This includes beef, pork, chicken, [place], and organ meats, such as liver. Seafood, such as clams, rainbow trout, salmon, tuna, and haddock. Eggs. Dairy foods such as milk, yogurt, and cheese. Eat breakfast cereals that have vitamin B12 added to them (are fortified). Check the label. The items listed above may not be a complete list of foods and beverages you can eat and drink. Contact a dietitian for more information. Alcohol use Do not drink alcohol if: Your doctor tells you not to drink. You are pregnant, may be pregnant, or are planning to become pregnant. If you drink alcohol: Limit how much you have to: 0-1 drink a day for women. 0-2 drinks a day for men. Know how much alcohol is in your drink. In the [place] one drink equals one 12 oz bottle of beer (355 mL), one 5 oz glass of wine (148 mL), or one 1 oz glass of hard liquor (44 mL). General instructions Get any vitamin B12 shots if told by your doctor. Take supplements only as told by your doctor. Follow the directions. Keep all follow-up visits. Contact a doctor if: Your symptoms come back. Your symptoms get worse or do not get better with treatment. Get help right away if: You have trouble breathing. You have a very fast heartbeat. You have chest pain. You get dizzy. You faint. These symptoms may be an emergency. Get help right away. Call 911.  Do not wait to see if the symptoms will go away. Do not drive yourself to the hospital. Summary Vitamin B12 deficiency means that your body is not getting enough of the vitamin. In some cases, there are no symptoms of this condition. Treatment may include making a change in the way you eat and drink, getting shots, or taking supplements. Eat foods that have vitamin B12 in them. This information is not intended to replace advice  given to you by your health care provider. Make sure you discuss any questions you have with your health care provider. Document Revised: 05/23/2021 Document Reviewed: 05/23/2021 Elsevier Patient Education  2024 [employer].

## 2024-02-14 ENCOUNTER — Telehealth: Payer: Self-pay | Admitting: Pharmacist

## 2024-02-14 ENCOUNTER — Telehealth: Payer: Self-pay | Admitting: Physical Medicine and Rehabilitation

## 2024-02-14 ENCOUNTER — Other Ambulatory Visit (HOSPITAL_COMMUNITY): Payer: Self-pay

## 2024-02-14 NOTE — Telephone Encounter (Signed)
 Pharmacy Patient Advocate Encounter   Received notification from CoverMyMeds that prior authorization for Ubrelvy  100MG  tablets is required/requested.   Insurance verification completed.   The patient is insured through [facility] .   Per test claim: PA is not needed at this time:

## 2024-02-14 NOTE — Telephone Encounter (Signed)
 Patient called. She would like her MRI results. Her cb# [phone]

## 2024-02-22 ENCOUNTER — Telehealth: Payer: Self-pay

## 2024-02-22 NOTE — Telephone Encounter (Signed)
 Called patient and left a voicemail letting her know that her appointment on 5/17 was at 1:00pm

## 2024-02-26 ENCOUNTER — Inpatient Hospital Stay

## 2024-02-26 VITALS — BP 117/77 | HR 61 | Temp 99.2°F | Resp 17

## 2024-02-26 DIAGNOSIS — E538 Deficiency of other specified B group vitamins: Secondary | ICD-10-CM | POA: Diagnosis not present

## 2024-02-26 DIAGNOSIS — D508 Other iron deficiency anemias: Secondary | ICD-10-CM

## 2024-02-26 MED ORDER — CYANOCOBALAMIN 1000 MCG/ML IJ SOLN
1000.0000 ug | Freq: Once | INTRAMUSCULAR | Status: AC
  Administered 2024-02-26: 1000 ug via INTRAMUSCULAR
  Filled 2024-02-26: qty 1

## 2024-02-28 ENCOUNTER — Ambulatory Visit: Admitting: Physical Medicine and Rehabilitation

## 2024-03-08 ENCOUNTER — Encounter: Payer: Self-pay | Admitting: Physical Medicine and Rehabilitation

## 2024-03-08 ENCOUNTER — Ambulatory Visit: Admitting: Physical Medicine and Rehabilitation

## 2024-03-08 DIAGNOSIS — M47819 Spondylosis without myelopathy or radiculopathy, site unspecified: Secondary | ICD-10-CM

## 2024-03-08 DIAGNOSIS — G8929 Other chronic pain: Secondary | ICD-10-CM

## 2024-03-08 DIAGNOSIS — M545 Low back pain, unspecified: Secondary | ICD-10-CM

## 2024-03-08 DIAGNOSIS — M47816 Spondylosis without myelopathy or radiculopathy, lumbar region: Secondary | ICD-10-CM

## 2024-03-08 NOTE — Progress Notes (Unsigned)
 Meredith Byrd - 43 y.o. female MRN [id number]  Date of birth: Oct 11, 1981  Office Visit Note: Visit Date: 03/08/2024 PCP: Pete Brand, DO Referred by: Pete Brand, DO  Subjective: Chief Complaint  Patient presents with   Lower Back - Pain   HPI: Meredith Byrd is a 43 y.o. female who comes in today for evaluation of chronic, worsening and severe bilateral lower back pain radiating to buttocks and hips, right greater than left. Pain ongoing for several years.  She is previous patient of Dr. Tommy Frames. Her pain worsens with sitting, standing and laying flat. Severe pain with household tasks such as washing dishes, sweeping and loading dishwasher. She describes her pain as sharp and stabbing sensation, currently rates as 9 out of 10. Some relief of pain with home exercise regimen, rest and use of medications. She is currently undergoing formal physical therapy at [facility], no relief of pain with these treatments. 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 There is advanced facet arthropathy throughout the lumbar spine. She underwent bilateral L4-L5 facet joint injections in our office in 2020 and 2021 with minimal lasting relief of pain. She is here today to discuss lumbar MRI findings, she is also requesting short term disability and referral to Dr. Gwendel Lemme at [facility]. Patient denies focal weakness, numbness and tingling. No recent trauma or falls.      Review of Systems  Musculoskeletal:  Positive for back pain.  Neurological:  Negative for tingling, sensory change, focal weakness and weakness.  All other systems reviewed and are negative.  Otherwise per HPI.  Assessment & Plan: Visit Diagnoses:    ICD-10-CM   1. Chronic bilateral low back pain without sciatica  M54.50 Ambulatory referral to Pain Clinic   G89.29     2. Spondylosis without myelopathy or radiculopathy  M47.819 Ambulatory  referral to Pain Clinic    3. Facet arthropathy, lumbar  M47.816 Ambulatory referral to Pain Clinic       Plan: Findings:  Chronic, worsening and severe bilateral lower back pain radiating to buttocks and hips, right greater than left. Patient continues to have severe pain despite good conservative therapies such as formal physical therapy, home exercise regimen, rest and use of medications. I discussed recent lumbar MRI with her today using imaging and spine model. Patients clinical presentation and exam are consistent with facet mediated pain. There is multi level severe facet arthropathy noted to lumbar spine. No relief of pain with previous facet joint injections. She is not interested in continuing with injection therapy. We discussed treatment plan in detail today. She is requesting referral to Dr. Gwendel Lemme at [facility], I did go ahead and place this referral. She is also requesting short term disability. I did write her out of work for 8 weeks while she is waiting to establish care with pain management. Patient has no questions at this time. I instructed her to continue with physical therapy, can contact us  with any concerns. No red flag symptoms noted upon exam today.    Meds & Orders: No orders of the defined types were placed in this encounter.   Orders Placed This Encounter  Procedures   Ambulatory referral to Pain Clinic    Follow-up: Return if symptoms worsen or fail to improve.   Procedures: No procedures performed      Clinical History: CLINICAL DATA:  Low back pain   EXAM: MRI LUMBAR SPINE WITHOUT CONTRAST  TECHNIQUE: Multiplanar, multisequence MR imaging of the lumbar spine was performed. No intravenous contrast was administered.   COMPARISON:  None Available.   FINDINGS: Segmentation: Partial sacralization of the L5 vertebra.   Alignment:  Physiologic lumbar alignment is maintained.   Vertebrae: Vertebral bodies demonstrate normal signal  intensity. No compression fractures.   Conus medullaris and cauda equina: The conus medullaris terminates at the level of L1-L2. The distal spinal cord signal intensity is normal.   Paraspinal and other soft tissues: The visualized abdomen and pelvis show no soft tissue abnormality. The visualized aorta is normal.   Disc levels:   L1-L2: Disc is normal in configuration. Moderate bilateral facet arthropathy. No neuroforaminal stenosis. No spinal canal stenosis.   L2-L3: Disc bulge. Severe bilateral facet arthropathy. Mild bilateral neuroforaminal stenosis. Mild spinal canal stenosis.   L3-L4: Disc bulge. Severe bilateral facet arthropathy. No neuroforaminal stenosis. No spinal canal stenosis.   L4-L5: Disc bulge. Moderate bilateral facet arthropathy. No neuroforaminal stenosis. No spinal canal stenosis.   L5-S1: Disc is normal in configuration. No facet arthropathy. No neuroforaminal stenosis. No spinal canal stenosis.   IMPRESSION: 1. 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 2. Mild canal and foraminal stenoses at L2-L3 secondary to disc bulging and facet arthropathy. 3. Advanced facet arthropathy throughout the lumbar spine.     Electronically Signed   By: Johnanna Mylar M.D.   On: 02/24/2024 13:24   She reports that she has never smoked. She has never used smokeless tobacco. No results for input(s): "HGBA1C", "LABURIC" in the last 8760 hours.  Objective:  VS:  HT:    WT:   BMI:     BP:   HR: bpm  TEMP: ( )  RESP:  Physical Exam Vitals and nursing note reviewed.  HENT:     Head: Normocephalic and atraumatic.     Right Ear: External ear normal.     Left Ear: External ear normal.     Nose: Nose normal.     Mouth/Throat:     Mouth: Mucous membranes are moist.  Eyes:     Extraocular Movements: Extraocular movements intact.  Cardiovascular:     Rate and Rhythm: Normal rate.     Pulses: Normal pulses.  Pulmonary:     Effort: Pulmonary  effort is normal.  Abdominal:     General: Abdomen is flat. There is no distension.  Musculoskeletal:        General: Tenderness present.     Cervical back: Normal range of motion.     Comments: Patient rises from seated position to standing without difficulty. Pain noted with facet loading and lumbar extension. 5/5 strength noted with bilateral hip flexion, knee flexion/extension, ankle dorsiflexion/plantarflexion and EHL. No clonus noted bilaterally. No pain upon palpation of greater trochanters. No pain with internal/external rotation of bilateral hips. Sensation intact bilaterally. Negative slump test bilaterally. Ambulates without aid, gait steady.     Skin:    General: Skin is warm and dry.     Capillary Refill: Capillary refill takes less than 2 seconds.  Neurological:     General: No focal deficit present.     Mental Status: She is alert and oriented to person, place, and time.  Psychiatric:        Mood and Affect: Mood normal.        Behavior: Behavior normal.     Ortho Exam  Imaging: No results found.  Past Medical/Family/Surgical/Social History: Medications & Allergies reviewed per EMR, new medications updated.  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   On Depo-Provera  for contraception 05/31/2019   Chronic insomnia 05/17/2019   Lumbar facet arthropathy 12/14/2018   Dysmenorrhea, unspecified 05/16/2018   Backache 01/26/2018   Knee pain, left 11/17/2017   Generalized anxiety disorder 10/04/2017   Morning headache 05/04/2017   Daytime somnolence 05/04/2017   Class 3 obesity with body mass index (BMI) of 45.0 to 49.9 in adult 03/28/2017   Chronic low back pain 01/19/2017    Vitamin D  deficiency 01/18/2017   Vitamin B12 deficiency 03/17/2016   Beta thalassemia trait 03/25/2015   Intractable migraine without aura and without status migrainosus 11/23/2014   Elevated sed rate 08/20/2014   Arthritis pain 08/20/2014   Iron deficiency anemia 07/09/2014   Symptomatic anemia 06/05/2014   Chest pain 05/01/2014   GERD (gastroesophageal reflux disease) 03/22/2014   S/P gastric bypass 03/22/2014   Insomnia 03/22/2014   Migraine 03/22/2014   Palpitations 03/22/2014   Past Medical History:  Diagnosis Date   Allergy    Anemia    Anxiety    Arthritis pain 08/20/2014   Back pain    COVID-19    GERD (gastroesophageal reflux disease)    History of stress test 02/2011 ([facility])   there was no evidence of ischemia, but she only went 3 1/2 minutes on the treadmill making it very difficult to get a good accurate assessment, however   Hx of echocardiogram    The echocadiogram was essentially normal with the exception of mild mitral calcification and borderline concentric LVH, which in the setting of her hypertension at this early age is something that mean her blood pressure is well controlled.    Hypertension    Iron deficiency anemia, unspecified 07/09/2014   Migraines    Palpitations    Swelling    Vitamin B12 deficiency 03/17/2016   Family History  Problem Relation Age of Onset   Asthma Mother    Heart disease Mother    Diabetes Mother    Early death Mother    Hypertension Mother    Thyroid  disease Mother    Anxiety disorder Mother    Hypertension Father    Heart disease Father    Breast cancer Sister 70   Breast cancer Maternal Aunt        under 52   Breast cancer Paternal Aunt    Stroke Maternal Grandmother    Heart disease Maternal Grandmother    Hypertension Maternal Grandmother    Hyperlipidemia Maternal Grandmother    Breast cancer Maternal Grandmother    Past Surgical History:  Procedure Laterality Date   BREAST SURGERY     CARPAL TUNNEL  RELEASE Right 09/24/2021   Procedure: RIGHT CARPAL TUNNEL RELEASE;  Surgeon: Wes Hamman, MD;  Location: [facility];  Service: Orthopedics;  Laterality: Right;   CHOLECYSTECTOMY     GASTRIC BYPASS  2008   OOPHORECTOMY Right 2010   REDUCTION MAMMAPLASTY Bilateral    Social History   Occupational History   Occupation: Human resources officer  Tobacco Use   Smoking status: Never   Smokeless tobacco: Never  Vaping Use   Vaping status: Never Used  Substance and Sexual Activity   Alcohol use: No  Drug use: No   Sexual activity: Yes

## 2024-03-08 NOTE — Progress Notes (Unsigned)
 Pain Scale   Average Pain 5 Patient advising she is here for an MRI review for her chronic lower back pain.        +Driver, -BT, -Dye Allergies.

## 2024-03-11 ENCOUNTER — Inpatient Hospital Stay

## 2024-03-11 VITALS — BP 116/65 | HR 63 | Temp 98.1°F | Resp 18

## 2024-03-11 DIAGNOSIS — D508 Other iron deficiency anemias: Secondary | ICD-10-CM

## 2024-03-11 DIAGNOSIS — E538 Deficiency of other specified B group vitamins: Secondary | ICD-10-CM

## 2024-03-11 MED ORDER — CYANOCOBALAMIN 1000 MCG/ML IJ SOLN
1000.0000 ug | Freq: Once | INTRAMUSCULAR | Status: AC
  Administered 2024-03-11: 1000 ug via INTRAMUSCULAR
  Filled 2024-03-11: qty 1

## 2024-03-25 ENCOUNTER — Inpatient Hospital Stay: Attending: Hematology and Oncology

## 2024-03-25 VITALS — BP 108/78 | HR 71 | Temp 98.1°F | Resp 20

## 2024-03-25 DIAGNOSIS — Z79899 Other long term (current) drug therapy: Secondary | ICD-10-CM | POA: Diagnosis not present

## 2024-03-25 DIAGNOSIS — D509 Iron deficiency anemia, unspecified: Secondary | ICD-10-CM | POA: Diagnosis present

## 2024-03-25 DIAGNOSIS — E538 Deficiency of other specified B group vitamins: Secondary | ICD-10-CM | POA: Insufficient documentation

## 2024-03-25 DIAGNOSIS — D508 Other iron deficiency anemias: Secondary | ICD-10-CM

## 2024-03-25 MED ORDER — CYANOCOBALAMIN 1000 MCG/ML IJ SOLN: 1000.0000 ug | Freq: Once | INTRAMUSCULAR | Status: AC

## 2024-04-05 ENCOUNTER — Telehealth: Payer: Self-pay

## 2024-04-05 ENCOUNTER — Telehealth: Payer: Self-pay | Admitting: Physical Medicine and Rehabilitation

## 2024-04-05 NOTE — Telephone Encounter (Signed)
 Pt called requesting a call back. Pt states pain management has no reach back out for an appt yet. Please call pt about this matter at [phone]

## 2024-04-05 NOTE — Telephone Encounter (Signed)
 Patient asking about her pain management referral, patient contacted and given the phone number for [facility]

## 2024-04-08 ENCOUNTER — Inpatient Hospital Stay

## 2024-04-08 VITALS — BP 101/41 | HR 66 | Temp 97.8°F | Resp 20

## 2024-04-08 DIAGNOSIS — E538 Deficiency of other specified B group vitamins: Secondary | ICD-10-CM

## 2024-04-08 DIAGNOSIS — D508 Other iron deficiency anemias: Secondary | ICD-10-CM

## 2024-04-08 MED ORDER — CYANOCOBALAMIN 1000 MCG/ML IJ SOLN
1000.0000 ug | Freq: Once | INTRAMUSCULAR | Status: AC
  Administered 2024-04-08: 1000 ug via INTRAMUSCULAR

## 2024-04-12 ENCOUNTER — Telehealth: Payer: Self-pay | Admitting: Physical Medicine and Rehabilitation

## 2024-04-12 NOTE — Telephone Encounter (Signed)
 Patient called and said that she was suppose to had sent over a referral for pain management and it was never sent. This was back in May. CB#[phone] Can you let her know when you send it.

## 2024-04-24 ENCOUNTER — Inpatient Hospital Stay

## 2024-04-24 ENCOUNTER — Inpatient Hospital Stay (HOSPITAL_BASED_OUTPATIENT_CLINIC_OR_DEPARTMENT_OTHER): Admitting: Hematology and Oncology

## 2024-04-24 ENCOUNTER — Other Ambulatory Visit: Payer: Self-pay | Admitting: Hematology and Oncology

## 2024-04-24 ENCOUNTER — Inpatient Hospital Stay: Attending: Hematology and Oncology

## 2024-04-24 VITALS — BP 131/80 | HR 78 | Temp 98.7°F | Resp 16 | Wt 182.7 lb

## 2024-04-24 DIAGNOSIS — D508 Other iron deficiency anemias: Secondary | ICD-10-CM

## 2024-04-24 DIAGNOSIS — D509 Iron deficiency anemia, unspecified: Secondary | ICD-10-CM | POA: Diagnosis present

## 2024-04-24 DIAGNOSIS — E538 Deficiency of other specified B group vitamins: Secondary | ICD-10-CM

## 2024-04-24 DIAGNOSIS — D563 Thalassemia minor: Secondary | ICD-10-CM | POA: Diagnosis not present

## 2024-04-24 LAB — CBC WITH DIFFERENTIAL (CANCER CENTER ONLY)
Abs Immature Granulocytes: 0.02 K/uL (ref 0.00–0.07)
Basophils Absolute: 0 K/uL (ref 0.0–0.1)
Basophils Relative: 0 %
Eosinophils Absolute: 0 K/uL (ref 0.0–0.5)
Eosinophils Relative: 1 %
HCT: 40.8 % (ref 36.0–46.0)
Hemoglobin: 13 g/dL (ref 12.0–15.0)
Immature Granulocytes: 0 %
Lymphocytes Relative: 29 %
Lymphs Abs: 1.8 K/uL (ref 0.7–4.0)
MCH: 24.3 pg — ABNORMAL LOW (ref 26.0–34.0)
MCHC: 31.9 g/dL (ref 30.0–36.0)
MCV: 76.3 fL — ABNORMAL LOW (ref 80.0–100.0)
Monocytes Absolute: 0.3 K/uL (ref 0.1–1.0)
Monocytes Relative: 5 %
Neutro Abs: 4.1 K/uL (ref 1.7–7.7)
Neutrophils Relative %: 65 %
Platelet Count: 562 K/uL — ABNORMAL HIGH (ref 150–400)
RBC: 5.35 MIL/uL — ABNORMAL HIGH (ref 3.87–5.11)
RDW: 15.4 % (ref 11.5–15.5)
WBC Count: 6.2 K/uL (ref 4.0–10.5)
nRBC: 0 % (ref 0.0–0.2)

## 2024-04-24 LAB — CMP (CANCER CENTER ONLY)
ALT: 35 U/L (ref 0–44)
AST: 21 U/L (ref 15–41)
Albumin: 4.5 g/dL (ref 3.5–5.0)
Alkaline Phosphatase: 71 U/L (ref 38–126)
Anion gap: 6 (ref 5–15)
BUN: 12 mg/dL (ref 6–20)
CO2: 25 mmol/L (ref 22–32)
Calcium: 9.6 mg/dL (ref 8.9–10.3)
Chloride: 109 mmol/L (ref 98–111)
Creatinine: 0.71 mg/dL (ref 0.44–1.00)
GFR, Estimated: >60 mL/min (ref >60)
Glucose, Bld: 87 mg/dL (ref 70–99)
Potassium: 3.8 mmol/L (ref 3.5–5.1)
Sodium: 140 mmol/L (ref 135–145)
Total Bilirubin: 0.4 mg/dL (ref 0.0–1.2)
Total Protein: 7.9 g/dL (ref 6.5–8.1)

## 2024-04-24 LAB — IRON AND IRON BINDING CAPACITY (CC-WL,HP ONLY)
Iron: 87 ug/dL (ref 28–170)
Saturation Ratios: 21 % (ref 10.4–31.8)
TIBC: 407 ug/dL (ref 250–450)
UIBC: 320 ug/dL (ref 148–442)

## 2024-04-24 LAB — RETIC PANEL
Immature Retic Fract: 17.7 % — ABNORMAL HIGH (ref 2.3–15.9)
RBC.: 5.31 MIL/uL — ABNORMAL HIGH (ref 3.87–5.11)
Retic Count, Absolute: 66.9 K/uL (ref 19.0–186.0)
Retic Ct Pct: 1.3 % (ref 0.4–3.1)
Reticulocyte Hemoglobin: 26.1 pg — ABNORMAL LOW (ref >27.9)

## 2024-04-24 LAB — VITAMIN B12: Vitamin B-12: 726 pg/mL (ref 180–914)

## 2024-04-24 LAB — FOLATE: Folate: 27.4 ng/mL (ref >5.9)

## 2024-04-24 MED ORDER — CYANOCOBALAMIN 1000 MCG/ML IJ SOLN
1000.0000 ug | Freq: Once | INTRAMUSCULAR | Status: AC
  Administered 2024-04-24: 1000 ug via INTRAMUSCULAR
  Filled 2024-04-24: qty 1

## 2024-04-24 NOTE — Progress Notes (Unsigned)
 [facility] [phone]   [phone]  PROGRESS NOTE  Patient Care Team: Gerome Brunet, DO as PCP - General (Family Medicine) Meisinger, Krystal, MD as Consulting Physician (Obstetrics and Gynecology) Federico Norleen [name], MD as Consulting Physician (Hematology and Oncology)  Hematological/Oncological History # Iron Deficiency Anemia 2/2 to Gastric Bipass Surgery 10/08/2009: WBC 5.8, Hgb 10.2, MCV 73.1, Plt 458 03/25/2015: WBC 7.0, Hgb 12.5, MCV 77.2, Plt 419 03/17/2016: WBC 3.8, Hgb 10.9, Plt 366, MCV 77.1 05/28/2020: WBC 4.1, Hgb 10.3, MCV 75.2, Plt 517 09/23/2020: establish care with Dr. Federico  12/20-12/27/2021: IV feraheme  510mg  q 7 days x 2 doses and 2 doses of IM Vitamin b12 1000mcg.  12/26/2020: WBC 4.3, Hgb 11.8, MCV 79.4, Plt 432 01/09/2022: Wbc 5.9, Hgb 12.1, MCV 77.2, Plt 544 09/28/2022: WBC 4.7, Hgb 11.2, MCV 79.2, Plt 365 12/25/2022: WBC 4.5, Hgb 11.2, MCV 79.2, Plt 454  03/26/2023: WBC 4.5, Hgb 11.2, MCV 79.8, Plt 471  Interval History:  Chestine M Byrd 43 y.o. female with medical history significant for iron deficiency anemia 2/2 to gastric bipass surgery who presents for a follow up visit. The patient's last visit was on 01/10/2024. In the interim since the last visit she has had no major changes in her health.   On exam today Meredith Byrd reports she continues to feel unwell with low energy levels.  She reports her energy today is about a 3 out of 10.  She is not having any associated lightheadedness, dizziness, or shortness of breath.  She reports that she does have occasional headaches but no vision changes.  She reports that she continues getting her vitamin B12 shots every 2 weeks and is tolerating it well.  She notes that her appetite remains good and she has had no recent infections such as fevers, chills, sweats.  She denies any runny nose, sore throat, cough.  Otherwise she feels well and has no recent changes in her health.  A full 10  point ROS is otherwise negative.  MEDICAL HISTORY:  Past Medical History:  Diagnosis Date   Allergy    Anemia    Anxiety    Arthritis pain 08/20/2014   Back pain    COVID-19    GERD (gastroesophageal reflux disease)    History of stress test 02/2011 ([facility])   there was no evidence of ischemia, but she only went 3 1/2 minutes on the treadmill making it very difficult to get a good accurate assessment, however   Hx of echocardiogram    The echocadiogram was essentially normal with the exception of mild mitral calcification and borderline concentric LVH, which in the setting of her hypertension at this early age is something that mean her blood pressure is well controlled.    Hypertension    Iron deficiency anemia, unspecified 07/09/2014   Migraines    Palpitations    Swelling    Vitamin B12 deficiency 03/17/2016    SURGICAL HISTORY: Past Surgical History:  Procedure Laterality Date   BREAST SURGERY     CARPAL TUNNEL RELEASE Right 09/24/2021   Procedure: RIGHT CARPAL TUNNEL RELEASE;  Surgeon: Jerri Kay HERO, MD;  Location: [facility];  Service: Orthopedics;  Laterality: Right;   CHOLECYSTECTOMY     GASTRIC BYPASS  2008   OOPHORECTOMY Right 2010   REDUCTION MAMMAPLASTY Bilateral     SOCIAL HISTORY: Social History   Socioeconomic History   Marital status: Married    Spouse name: Damon   Number of children:  4   Years of education: Associates   Highest education level: Not on file  Occupational History   Occupation: Insurance Claims  Tobacco Use   Smoking status: Never   Smokeless tobacco: Never  Vaping Use   Vaping status: Never Used  Substance and Sexual Activity   Alcohol use: No   Drug use: No   Sexual activity: Yes  Other Topics Concern   Not on file  Social History Narrative   Right handed.   Lives at home with husband and four children.   No caffeine  use.   Social Drivers of Corporate investment banker Strain: Not on file  Food Insecurity:  Not on file  Transportation Needs: Not on file  Physical Activity: Not on file  Stress: Not on file  Social Connections: Not on file  Intimate Partner Violence: Not on file    FAMILY HISTORY: Family History  Problem Relation Age of Onset   Asthma Mother    Heart disease Mother    Diabetes Mother    Early death Mother    Hypertension Mother    Thyroid  disease Mother    Anxiety disorder Mother    Hypertension Father    Heart disease Father    Breast cancer Sister 66   Breast cancer Maternal Aunt        under 38   Breast cancer Paternal Aunt    Stroke Maternal Grandmother    Heart disease Maternal Grandmother    Hypertension Maternal Grandmother    Hyperlipidemia Maternal Grandmother    Breast cancer Maternal Grandmother     ALLERGIES:  is allergic to doxycycline , fioricet [butalbital -apap-caffeine ], inderal  [propranolol ], and nsaids.  MEDICATIONS:  Current Outpatient Medications  Medication Sig Dispense Refill   acetaminophen  (TYLENOL ) 500 MG tablet Take 2 tablets (1,000 mg total) by mouth every 6 (six) hours as needed. 30 tablet 0   acetaminophen -codeine  (TYLENOL  #3) 300-30 MG tablet Take 1 tablet by mouth 2 (two) times daily as needed for moderate pain. 30 tablet 0   albuterol  (VENTOLIN  HFA) 108 (90 Base) MCG/ACT inhaler Inhale 1-2 puffs into the lungs every 6 (six) hours as needed for wheezing or shortness of breath. 18 g 0   cloNIDine (CATAPRES) 0.2 MG tablet Take 0.4 mg by mouth at bedtime.     Cyanocobalamin  (B-12 COMPLIANCE INJECTION) 1000 MCG/ML KIT Inject as directed.     Galcanezumab -gnlm (EMGALITY ) 120 MG/ML SOSY 240 mg subQ (2 consecutive 120-mg doses) once as a loading dose, followed by 120 mg once monthly 3 mL 6   HYDROcodone -acetaminophen  (NORCO) 5-325 MG tablet Take 1 tablet by mouth 2 (two) times daily as needed. 10 tablet 0   medroxyPROGESTERone  (DEPO-PROVERA ) 150 MG/ML injection      montelukast (SINGULAIR) 10 MG tablet Take 10 mg by mouth at bedtime.      ondansetron  (ZOFRAN ) 4 MG tablet Take 1-2 tablets (4-8 mg total) by mouth every 8 (eight) hours as needed for nausea or vomiting. 10 tablet 0   traMADol  (ULTRAM ) 50 MG tablet Take 1 tablet (50 mg total) by mouth daily as needed. 20 tablet 0   traMADol  (ULTRAM ) 50 MG tablet Take 1 tablet (50 mg total) by mouth 3 (three) times daily as needed. 30 tablet 0   Ubrogepant  (UBRELVY ) 100 MG TABS Take 1 tablet at the onset of migraine. Can repeat in 2 hours if needed. Only 2 tabs/24 hours 12 tablet 11   UNABLE TO FIND Med Name: allergy injections  3 times a week  verapamil  (CALAN -SR) 120 MG CR tablet Take 1 tablet (120 mg total) by mouth at bedtime. 30 tablet 11   Vitamin D , Ergocalciferol , (DRISDOL ) 1.25 MG (50000 UNIT) CAPS capsule TAKE 1 CAPSULE BY MOUTH EVERY 7 DAYS 12 capsule 0   No current facility-administered medications for this visit.    REVIEW OF SYSTEMS:   Constitutional: ( - ) fevers, ( - )  chills , ( - ) night sweats Eyes: ( - ) blurriness of vision, ( - ) double vision, ( - ) watery eyes Ears, nose, mouth, throat, and face: ( - ) mucositis, ( - ) sore throat Respiratory: ( - ) cough, ( - ) dyspnea, ( - ) wheezes Cardiovascular: ( - ) palpitation, ( - ) chest discomfort, ( - ) lower extremity swelling Gastrointestinal:  ( - ) nausea, ( - ) heartburn, ( - ) change in bowel habits Skin: ( - ) abnormal skin rashes Lymphatics: ( - ) new lymphadenopathy, ( - ) easy bruising Neurological: ( - ) numbness, ( - ) tingling, ( - ) new weaknesses Behavioral/Psych: ( - ) mood change, ( - ) new changes  All other systems were reviewed with the patient and are negative.  PHYSICAL EXAMINATION:  Vitals:   04/24/24 1517  BP: 131/80  Pulse: 78  Resp: 16  Temp: 98.7 F (37.1 C)  SpO2: 98%        Filed Weights   04/24/24 1517  Weight: 182 lb 11.2 oz (82.9 kg)       GENERAL: tired appearing middle aged [place] American female. alert, no distress and comfortable SKIN: skin color,  texture, turgor are normal, no rashes or significant lesions EYES: conjunctiva are pink and non-injected, sclera clear LUNGS: clear to auscultation and percussion with normal breathing effort HEART: regular rate & rhythm and no murmurs and no lower extremity edema Musculoskeletal: no cyanosis of digits and no clubbing  PSYCH: alert & oriented x 3, fluent speech NEURO: no focal motor/sensory deficits  LABORATORY DATA:  I have reviewed the data as listed    Latest Ref Rng & Units 04/24/2024    2:53 PM 01/10/2024    3:04 PM 10/04/2023    2:24 PM  CBC  WBC 4.0 - 10.5 K/uL 6.2  4.5  6.1   Hemoglobin 12.0 - 15.0 g/dL 86.9  88.5  87.5   Hematocrit 36.0 - 46.0 % 40.8  36.6  36.8   Platelets 150 - 400 K/uL 562  452  425        Latest Ref Rng & Units 04/24/2024    2:53 PM 01/10/2024    3:04 PM 10/04/2023    2:24 PM  CMP  Glucose 70 - 99 mg/dL 87  92  98   BUN 6 - 20 mg/dL 12  8  6    Creatinine 0.44 - 1.00 mg/dL 9.28  9.17  9.41   Sodium 135 - 145 mmol/L 140  140  139   Potassium 3.5 - 5.1 mmol/L 3.8  3.9  3.6   Chloride 98 - 111 mmol/L 109  107  106   CO2 22 - 32 mmol/L 25  27  25    Calcium 8.9 - 10.3 mg/dL 9.6  8.9  9.4   Total Protein 6.5 - 8.1 g/dL 7.9  7.2  7.2   Total Bilirubin 0.0 - 1.2 mg/dL 0.4  0.4  0.4   Alkaline Phos 38 - 126 U/L 71  59  66   AST 15 - 41 U/L  21  19  36   ALT 0 - 44 U/L 35  26  59     RADIOGRAPHIC STUDIES: No results found.  ASSESSMENT & PLAN Siona M Beverley 43 y.o. female with medical history significant for iron deficiency anemia 2/2 to gastric bipass surgery who presents for a follow up visit.  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  -- labs today show WBC 6.2, hemoglobin 13.0, MCV 76.3, platelets 562.  Iron studies  pending. --Currently receiving IM B12 injection 1000 mcg q 2 weeks. Recommend to continue. -- If iron levels are low will plan to proceed with IV iron. --Return to clinic in 3 months with interval vitamin B12 injections every 2 weeks.  #Fatigue, stable --Patient still has marked fatigue despite adequate repletion of iron and vitamin B12.   --normal TSH and hemoglobin A1c from 12/26/2020 --Folate levels have normalized from 03/21/2021 --H/O of vitamin D  deficiency so currently takes vitamin D  supplementation.    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   No orders of the defined types were placed in this encounter.  All questions were answered. The patient knows to call the clinic with any problems, questions or concerns.  I have spent a total of 30 minutes minutes of face-to-face and non-face-to-face time, preparing to see the patient, performing a medically appropriate examination, counseling and educating the patient,  documenting clinical information in the electronic health record, and care coordination.   Norleen IVAR Kidney, MD Department of Hematology/Oncology [facility] Phone: [phone] Pager: [phone] Email: [email or web address]   04/25/2024 7:05 PM

## 2024-04-25 ENCOUNTER — Encounter: Payer: Self-pay | Admitting: Hematology and Oncology

## 2024-04-25 LAB — FERRITIN: Ferritin: 370 ng/mL — ABNORMAL HIGH (ref 11–307)

## 2024-04-26 LAB — METHYLMALONIC ACID, SERUM: Methylmalonic Acid, Quantitative: 126 nmol/L (ref 0–378)

## 2024-04-27 ENCOUNTER — Ambulatory Visit: Payer: Self-pay | Admitting: *Deleted

## 2024-04-27 ENCOUNTER — Telehealth: Payer: Self-pay | Admitting: Physical Medicine and Rehabilitation

## 2024-04-27 NOTE — Telephone Encounter (Signed)
 TCT patient regarding her lab results. Spoke with her. Advised that her nutritional labs are normal.  She has good iron stores and adequate levels of vitamin B12.  We will continue with her every 2-week vitamin B12.  No need for IV  iron at this time.  Will plan to follow-up with her as scheduled. Pt voiced understanding  She is aware of her future appts.

## 2024-04-27 NOTE — Telephone Encounter (Signed)
-----   Message from Norleen ONEIDA Kidney IV sent at 04/25/2024  7:05 PM EDT ----- Please let Ms. Dotts know that her nutritional labs are normal.  She has good iron stores and adequate levels of vitamin B12.  We will continue with her every 2-week vitamin B12.  No need for IV  iron at this time.  Will plan to follow-up with her as scheduled. ----- Message ----- From: Rebecka, Lab In [place] Sent: 04/24/2024   3:10 PM EDT To: Norleen ONEIDA Kidney MADISON, MD

## 2024-04-27 NOTE — Telephone Encounter (Signed)
 Patient called and said that the referral that [facility] sent has to be resent again. They have been trying to get the insurance cards with the referral all at once and not separately she said. She would like for Megan to give her a call and not her nurse she said. CB#[phone]

## 2024-05-02 ENCOUNTER — Ambulatory Visit: Admitting: Physical Medicine and Rehabilitation

## 2024-05-02 ENCOUNTER — Encounter: Payer: Self-pay | Admitting: Physical Medicine and Rehabilitation

## 2024-05-02 DIAGNOSIS — M47816 Spondylosis without myelopathy or radiculopathy, lumbar region: Secondary | ICD-10-CM

## 2024-05-02 DIAGNOSIS — M5442 Lumbago with sciatica, left side: Secondary | ICD-10-CM | POA: Diagnosis not present

## 2024-05-02 DIAGNOSIS — M5416 Radiculopathy, lumbar region: Secondary | ICD-10-CM

## 2024-05-02 DIAGNOSIS — G894 Chronic pain syndrome: Secondary | ICD-10-CM

## 2024-05-02 DIAGNOSIS — G8929 Other chronic pain: Secondary | ICD-10-CM

## 2024-05-02 DIAGNOSIS — M5441 Lumbago with sciatica, right side: Secondary | ICD-10-CM

## 2024-05-02 MED ORDER — ACETAMINOPHEN-CODEINE 300-30 MG PO TABS: 1.0000 | ORAL_TABLET | Freq: Three times a day (TID) | ORAL | 0 refills | Status: AC | PRN

## 2024-05-02 NOTE — Progress Notes (Signed)
 Meredith Byrd - 43 y.o. female MRN [id number]  Date of birth: 1981-05-08  Office Visit Note: Visit Date: 05/02/2024 PCP: Gerome Brunet, DO Referred by: Gerome Brunet, DO  Subjective: Chief Complaint  Patient presents with   Lower Back - Pain   HPI: Meredith Byrd is a 43 y.o. female who comes in today for evaluation of chronic, worsening and severe bilateral lower back pain radiating to buttocks and hips and down posterior legs, right greater than left. Pain ongoing for several years.  Her pain worsens with sitting, standing and laying flat. Severe pain with household tasks such as cooking and washing dishes. She describes her pain as sharp and stabbing sensation, currently rates as 7 out of 10. Some relief of pain with home exercise regimen, rest and use of medications. She is currently undergoing formal physical therapy at [facility], no relief of pain with these treatments. 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 There is advanced facet arthropathy throughout the lumbar spine. She underwent bilateral L4-L5 facet joint injections in our office in 2020 and 2021 with minimal lasting relief of pain. She is here today to follow up from previous chronic pain management referral. Patient denies focal weakness. No recent trauma or falls.   She reports difficulty schedule appointment with [facility]. States there was a miscommunication with the referral. She does have upcoming appointment with Dr. Orlando on 06/05/2024.     Review of Systems  Musculoskeletal:  Positive for back pain and myalgias.  Neurological:  Negative for tingling, sensory change, focal weakness and weakness.  All other systems reviewed and are negative.  Otherwise per HPI.  Assessment & Plan: Visit Diagnoses:    ICD-10-CM   1. Chronic bilateral low back pain with bilateral sciatica  M54.42    M54.41    G89.29     2. Lumbar  radiculopathy  M54.16     3. Facet arthropathy, lumbar  M47.816     4. Chronic pain syndrome  G89.4        Plan: Findings:  Chronic, worsening and severe bilateral lower back pain radiating to buttocks and hips and down posterior legs, right greater than left. She continues to have severe pain despite good conservative therapies such as formal physical therapy, home exercise regimen, rest and use of medications. Patients clinical presentation and exam are consistent with facet mediated pain. There is multi level severe facet arthropathy noted to lumbar spine. No relief of pain with previous facet joint injections. She is not interested in continuing with injection therapy. She has upcoming appointment with Dr. Orlando in the coming weeks. I did extend her leave through 06/05/2024. I also prescribed short course of Tylenol  #3 while she is waiting to be evaluated. I instructed her to continue with formal physical therapy as needed. No red flag symptoms noted upon exam today.     Meds & Orders:  Meds ordered this encounter  Medications   acetaminophen -codeine  (TYLENOL  #3) 300-30 MG tablet    Sig: Take 1 tablet by mouth every 8 (eight) hours as needed for moderate pain (pain score 4-6) or severe pain (pain score 7-10).    Dispense:  20 tablet    Refill:  0   No orders of the defined types were placed in this encounter.   Follow-up: Return if symptoms worsen or fail to improve.   Procedures: No procedures performed      Clinical History: CLINICAL DATA:  Low  back pain   EXAM: MRI LUMBAR SPINE WITHOUT CONTRAST   TECHNIQUE: Multiplanar, multisequence MR imaging of the lumbar spine was performed. No intravenous contrast was administered.   COMPARISON:  None Available.   FINDINGS: Segmentation: Partial sacralization of the L5 vertebra.   Alignment:  Physiologic lumbar alignment is maintained.   Vertebrae: Vertebral bodies demonstrate normal signal intensity. No compression  fractures.   Conus medullaris and cauda equina: The conus medullaris terminates at the level of L1-L2. The distal spinal cord signal intensity is normal.   Paraspinal and other soft tissues: The visualized abdomen and pelvis show no soft tissue abnormality. The visualized aorta is normal.   Disc levels:   L1-L2: Disc is normal in configuration. Moderate bilateral facet arthropathy. No neuroforaminal stenosis. No spinal canal stenosis.   L2-L3: Disc bulge. Severe bilateral facet arthropathy. Mild bilateral neuroforaminal stenosis. Mild spinal canal stenosis.   L3-L4: Disc bulge. Severe bilateral facet arthropathy. No neuroforaminal stenosis. No spinal canal stenosis.   L4-L5: Disc bulge. Moderate bilateral facet arthropathy. No neuroforaminal stenosis. No spinal canal stenosis.   L5-S1: Disc is normal in configuration. No facet arthropathy. No neuroforaminal stenosis. No spinal canal stenosis.   IMPRESSION: 1. 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 2. Mild canal and foraminal stenoses at L2-L3 secondary to disc bulging and facet arthropathy. 3. Advanced facet arthropathy throughout the lumbar spine.     Electronically Signed   By: Clem Savory M.D.   On: 02/24/2024 13:24   She reports that she has never smoked. She has never used smokeless tobacco. No results for input(s): HGBA1C, LABURIC in the last 8760 hours.  Objective:  VS:  HT:    WT:   BMI:     BP:   HR: bpm  TEMP: ( )  RESP:  Physical Exam Vitals and nursing note reviewed.  HENT:     Head: Normocephalic and atraumatic.     Right Ear: External ear normal.     Left Ear: External ear normal.     Nose: Nose normal.     Mouth/Throat:     Mouth: Mucous membranes are moist.  Eyes:     Extraocular Movements: Extraocular movements intact.  Cardiovascular:     Rate and Rhythm: Normal rate.     Pulses: Normal pulses.  Pulmonary:     Effort: Pulmonary effort is normal.   Abdominal:     General: Abdomen is flat. There is no distension.  Musculoskeletal:        General: Tenderness present.     Cervical back: Normal range of motion.     Comments: Patient rises from seated position to standing without difficulty. Pain noted with facet loading and lumbar extension. 5/5 strength noted with bilateral hip flexion, knee flexion/extension, ankle dorsiflexion/plantarflexion and EHL. No clonus noted bilaterally. No pain upon palpation of greater trochanters. No pain with internal/external rotation of bilateral hips. Sensation intact bilaterally. Negative slump test bilaterally. Ambulates without aid, gait steady.   Skin:    General: Skin is warm and dry.     Capillary Refill: Capillary refill takes less than 2 seconds.  Neurological:     General: No focal deficit present.     Mental Status: She is alert and oriented to person, place, and time.  Psychiatric:        Mood and Affect: Mood normal.        Behavior: Behavior normal.     Ortho Exam  Imaging: No results found.  Past Medical/Family/Surgical/Social  History: Medications & Allergies reviewed per EMR, new medications updated. 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   On Depo-Provera  for contraception 05/31/2019   Chronic insomnia 05/17/2019   Lumbar facet arthropathy 12/14/2018   Dysmenorrhea, unspecified 05/16/2018   Backache 01/26/2018   Knee pain, left 11/17/2017   Generalized anxiety disorder 10/04/2017   Morning headache 05/04/2017   Daytime somnolence 05/04/2017   Class 3 obesity with body mass index (BMI) of 45.0 to 49.9 in adult 03/28/2017   Chronic low back pain 01/19/2017   Vitamin D   deficiency 01/18/2017   Vitamin B12 deficiency 03/17/2016   Beta thalassemia trait 03/25/2015   Intractable migraine without aura and without status migrainosus 11/23/2014   Elevated sed rate 08/20/2014   Arthritis pain 08/20/2014   Iron deficiency anemia 07/09/2014   Symptomatic anemia 06/05/2014   Chest pain 05/01/2014   GERD (gastroesophageal reflux disease) 03/22/2014   S/P gastric bypass 03/22/2014   Insomnia 03/22/2014   Migraine 03/22/2014   Palpitations 03/22/2014   Past Medical History:  Diagnosis Date   Allergy    Anemia    Anxiety    Arthritis pain 08/20/2014   Back pain    COVID-19    GERD (gastroesophageal reflux disease)    History of stress test 02/2011 ([facility])   there was no evidence of ischemia, but she only went 3 1/2 minutes on the treadmill making it very difficult to get a good accurate assessment, however   Hx of echocardiogram    The echocadiogram was essentially normal with the exception of mild mitral calcification and borderline concentric LVH, which in the setting of her hypertension at this early age is something that mean her blood pressure is well controlled.    Hypertension    Iron deficiency anemia, unspecified 07/09/2014   Migraines    Palpitations    Swelling    Vitamin B12 deficiency 03/17/2016   Family History  Problem Relation Age of Onset   Asthma Mother    Heart disease Mother    Diabetes Mother    Early death Mother    Hypertension Mother    Thyroid  disease Mother    Anxiety disorder Mother    Hypertension Father    Heart disease Father    Breast cancer Sister 7   Breast cancer Maternal Aunt        under 28   Breast cancer Paternal Aunt    Stroke Maternal Grandmother    Heart disease Maternal Grandmother    Hypertension Maternal Grandmother    Hyperlipidemia Maternal Grandmother    Breast cancer Maternal Grandmother    Past Surgical History:  Procedure Laterality Date   BREAST SURGERY     CARPAL TUNNEL RELEASE Right  09/24/2021   Procedure: RIGHT CARPAL TUNNEL RELEASE;  Surgeon: Jerri Kay HERO, MD;  Location: [facility];  Service: Orthopedics;  Laterality: Right;   CHOLECYSTECTOMY     GASTRIC BYPASS  2008   OOPHORECTOMY Right 2010   REDUCTION MAMMAPLASTY Bilateral    Social History   Occupational History   Occupation: Insurance Claims  Tobacco Use   Smoking status: Never   Smokeless tobacco: Never  Vaping Use   Vaping status: Never Used  Substance and Sexual Activity   Alcohol use: No   Drug use: No   Sexual activity: Yes

## 2024-05-02 NOTE — Progress Notes (Signed)
 Pain Scale   Average Pain 7 Patient advised she has chronic lower back pain radiating to right leg at times. Patient advising she has constant pain.        +Driver, -BT, -Dye Allergies.

## 2024-05-13 ENCOUNTER — Inpatient Hospital Stay: Attending: Hematology and Oncology

## 2024-05-13 VITALS — BP 96/57 | HR 63 | Temp 97.7°F | Resp 20

## 2024-05-13 DIAGNOSIS — D508 Other iron deficiency anemias: Secondary | ICD-10-CM

## 2024-05-13 DIAGNOSIS — E538 Deficiency of other specified B group vitamins: Secondary | ICD-10-CM | POA: Insufficient documentation

## 2024-05-13 DIAGNOSIS — Z79899 Other long term (current) drug therapy: Secondary | ICD-10-CM | POA: Diagnosis not present

## 2024-05-13 DIAGNOSIS — D509 Iron deficiency anemia, unspecified: Secondary | ICD-10-CM | POA: Insufficient documentation

## 2024-05-13 MED ORDER — CYANOCOBALAMIN 1000 MCG/ML IJ SOLN
1000.0000 ug | Freq: Once | INTRAMUSCULAR | Status: AC
Start: 2024-05-13 — End: 2024-05-13
  Administered 2024-05-13: 1000 ug via INTRAMUSCULAR
  Filled 2024-05-13: qty 1

## 2024-05-27 ENCOUNTER — Inpatient Hospital Stay

## 2024-05-27 VITALS — BP 119/76 | HR 51 | Temp 98.2°F | Resp 17

## 2024-05-27 DIAGNOSIS — D508 Other iron deficiency anemias: Secondary | ICD-10-CM

## 2024-05-27 DIAGNOSIS — E538 Deficiency of other specified B group vitamins: Secondary | ICD-10-CM

## 2024-05-27 MED ORDER — CYANOCOBALAMIN 1000 MCG/ML IJ SOLN
1000.0000 ug | Freq: Once | INTRAMUSCULAR | Status: AC
  Administered 2024-05-27: 1000 ug via INTRAMUSCULAR
  Filled 2024-05-27: qty 1

## 2024-06-10 ENCOUNTER — Inpatient Hospital Stay

## 2024-06-10 VITALS — BP 100/65 | HR 63 | Temp 97.1°F | Resp 18

## 2024-06-10 DIAGNOSIS — E538 Deficiency of other specified B group vitamins: Secondary | ICD-10-CM | POA: Diagnosis not present

## 2024-06-10 DIAGNOSIS — D508 Other iron deficiency anemias: Secondary | ICD-10-CM

## 2024-06-10 MED ORDER — CYANOCOBALAMIN 1000 MCG/ML IJ SOLN
1000.0000 ug | Freq: Once | INTRAMUSCULAR | Status: AC
  Administered 2024-06-10: 1000 ug via INTRAMUSCULAR

## 2024-06-24 ENCOUNTER — Inpatient Hospital Stay: Attending: Hematology and Oncology

## 2024-06-24 VITALS — BP 112/58 | HR 63 | Resp 18

## 2024-06-24 DIAGNOSIS — E538 Deficiency of other specified B group vitamins: Secondary | ICD-10-CM | POA: Diagnosis present

## 2024-06-24 DIAGNOSIS — D509 Iron deficiency anemia, unspecified: Secondary | ICD-10-CM | POA: Insufficient documentation

## 2024-06-24 DIAGNOSIS — D508 Other iron deficiency anemias: Secondary | ICD-10-CM

## 2024-06-24 DIAGNOSIS — Z79899 Other long term (current) drug therapy: Secondary | ICD-10-CM | POA: Insufficient documentation

## 2024-06-24 MED ORDER — CYANOCOBALAMIN 1000 MCG/ML IJ SOLN
1000.0000 ug | Freq: Once | INTRAMUSCULAR | Status: AC
  Administered 2024-06-24: 1000 ug via INTRAMUSCULAR
  Filled 2024-06-24: qty 1

## 2024-06-26 ENCOUNTER — Ambulatory Visit (HOSPITAL_COMMUNITY)
Admission: EM | Admit: 2024-06-26 | Discharge: 2024-06-26 | Disposition: A | Discharge status: Home / Self Care | Attending: Physician Assistant | Admitting: Physician Assistant

## 2024-06-26 ENCOUNTER — Other Ambulatory Visit: Payer: Self-pay

## 2024-06-26 ENCOUNTER — Encounter (HOSPITAL_COMMUNITY): Payer: Self-pay | Admitting: *Deleted

## 2024-06-26 DIAGNOSIS — K5900 Constipation, unspecified: Secondary | ICD-10-CM

## 2024-06-26 MED ORDER — GLYCERIN (ADULT) 2 G RE SUPP: 1.0000 | RECTAL | 0 refills | Status: DC | PRN

## 2024-06-26 MED ORDER — MAGNESIUM CITRATE PO SOLN: 296.0000 mL | Freq: Once | ORAL | 0 refills | Status: DC

## 2024-06-26 MED ORDER — GLYCERIN (ADULT) 2 G RE SUPP: 1.0000 | RECTAL | 0 refills | Status: AC | PRN

## 2024-06-26 MED ORDER — MAGNESIUM CITRATE PO SOLN: 296.0000 mL | Freq: Once | ORAL | 0 refills | Status: AC

## 2024-06-26 NOTE — ED Provider Notes (Signed)
 [facility]    CSN: [id number] Arrival date & time: 06/26/24  1638      History   Chief Complaint Chief Complaint  Patient presents with   Fecal Impaction    HPI Meredith Byrd is a 43 y.o. female.   Patient presents today with a several day history of constipation.  She is unsure when she had a a bowel movement believes it was last week.  She can tell that there is stool in her rectal vault but has been unable to pass this.  She reports that she was passing gas up until earlier today.  Pain is rated 7 on a 0-10 pain scale, localized to her lower abdomen and rectum, described as pressure, no alleviating factors identified.  Denies any fever, nausea, vomiting.  She has had constipation and fecal impactions in the past particularly when she was on oral iron but has not had them more recently since she transition to iron infusions.  She denies any recent medication or dietary changes.  She has tried stool softeners without improvement of symptoms.  Denies previous abdominal surgeries including hernias.     Past Medical History:  Diagnosis Date   Allergy    Anemia    Anxiety    Arthritis pain 08/20/2014   Back pain    COVID-19    GERD (gastroesophageal reflux disease)    History of stress test 02/2011 ([facility])   there was no evidence of ischemia, but she only went 3 1/2 minutes on the treadmill making it very difficult to get a good accurate assessment, however   Hx of echocardiogram    The echocadiogram was essentially normal with the exception of mild mitral calcification and borderline concentric LVH, which in the setting of her hypertension at this early age is something that mean her blood pressure is well controlled.    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   On Depo-Provera  for contraception 05/31/2019   Chronic insomnia 05/17/2019   Lumbar facet arthropathy 12/14/2018   Dysmenorrhea, unspecified 05/16/2018   Backache 01/26/2018   Knee pain, left 11/17/2017   Generalized anxiety disorder 10/04/2017   Morning headache 05/04/2017   Daytime somnolence 05/04/2017   Class 3 obesity with body mass index (BMI) of 45.0 to 49.9 in adult 03/28/2017   Chronic low back pain 01/19/2017   Vitamin D  deficiency 01/18/2017   Vitamin B12 deficiency 03/17/2016   Beta thalassemia trait 03/25/2015   Intractable migraine without aura and without status migrainosus 11/23/2014   Elevated sed rate 08/20/2014   Arthritis pain 08/20/2014   Iron deficiency anemia 07/09/2014   Symptomatic anemia 06/05/2014   Chest pain 05/01/2014   GERD (gastroesophageal reflux disease) 03/22/2014   S/P gastric bypass 03/22/2014   Insomnia 03/22/2014   Migraine 03/22/2014   Palpitations 03/22/2014    Past Surgical History:  Procedure Laterality Date   BREAST SURGERY     CARPAL TUNNEL RELEASE Right 09/24/2021   Procedure: RIGHT CARPAL TUNNEL RELEASE;  Surgeon: Jerri Kay CHRISTELLA, MD;  Location: [facility];  Service: Orthopedics;  Laterality: Right;   CHOLECYSTECTOMY     GASTRIC BYPASS  2008   OOPHORECTOMY Right 2010   REDUCTION MAMMAPLASTY Bilateral     OB History     Gravida  4   Para  4   Term  4   Preterm      AB      Living         SAB      IAB      Ectopic      Multiple      Live Births               Home Medications    Prior to Admission medications   Medication Sig Start Date End Date Taking? Authorizing Provider  acetaminophen  (TYLENOL ) 500 MG tablet Take 2 tablets (1,000 mg  total) by mouth every 6 (six) hours as needed. 10/31/18  Yes Wieters, Hallie C, PA-C  acetaminophen -codeine  (TYLENOL  #3) 300-30 MG tablet Take 1 tablet by mouth every 8 (eight) hours as needed for moderate pain (pain score 4-6) or severe pain (pain score 7-10). 05/02/24  Yes Williams, Megan E, NP  albuterol  (VENTOLIN  HFA) 108 (90 Base) MCG/ACT inhaler Inhale 1-2 puffs into the lungs every 6 (six) hours as needed for wheezing or shortness of breath. 06/26/20  Yes Stuart Vernell Norris, PA-C  cloNIDine (CATAPRES) 0.2 MG tablet Take 0.4 mg by mouth at bedtime. 05/20/20  Yes [provider]  Cyanocobalamin  (B-12 COMPLIANCE INJECTION) 1000 MCG/ML KIT Inject as directed.   Yes [provider]  montelukast (SINGULAIR) 10 MG tablet Take 10 mg by mouth at bedtime.   Yes [provider]  ondansetron  (ZOFRAN ) 4 MG tablet Take 1-2 tablets (4-8 mg total) by mouth every 8 (eight) hours as needed for nausea or vomiting. 10/07/23  Yes Jerri Kay HERO, MD  traMADol  (ULTRAM ) 50 MG tablet Take 1 tablet (50 mg total) by mouth 3 (three) times daily as needed. 11/01/23  Yes Jule Ronal CROME, PA-C  verapamil  (CALAN -SR) 120 MG CR tablet Take 1 tablet (120 mg total) by mouth at bedtime. 09/01/22  Yes Onita Duos, MD  Galcanezumab -gnlm (EMGALITY ) 120 MG/ML SOSY 240 mg subQ (2 consecutive 120-mg doses) once as a loading dose, followed by 120 mg once monthly 09/01/22   Onita Duos, MD  glycerin  adult 2 g suppository Place 1 suppository rectally as needed for constipation. 06/26/24   Jaren Kearn K, PA-C  magnesium  citrate solution Take 296 mLs by mouth once for 1 dose. 06/26/24 06/26/24  Masen Luallen K, PA-C  traMADol  (ULTRAM ) 50 MG tablet Take 1 tablet (50 mg total) by mouth daily as needed. 08/02/23   Jule Ronal CROME, PA-C  Ubrogepant  (UBRELVY ) 100 MG TABS Take 1 tablet at the onset of migraine. Can repeat in 2 hours if needed. Only 2 tabs/24 hours 09/01/22   Onita Duos, MD  UNABLE TO FIND Med Name: allergy  injections  3 times a week    [provider]  Vitamin D , Ergocalciferol , (DRISDOL ) 1.25 MG (50000 UNIT) CAPS capsule TAKE 1 CAPSULE BY MOUTH EVERY 7 DAYS 05/14/22   Neomi Johnston DASEN, PA-C    Family History Family History  Problem Relation Age of Onset   Asthma Mother    Heart disease Mother    Diabetes Mother    Early death Mother    Hypertension Mother    Thyroid  disease Mother    Anxiety disorder Mother    Hypertension  Father    Heart disease Father    Breast cancer Sister 38   Breast cancer Maternal Aunt        under 79   Breast cancer Paternal Aunt    Stroke Maternal Grandmother    Heart disease Maternal Grandmother    Hypertension Maternal Grandmother    Hyperlipidemia Maternal Grandmother    Breast cancer Maternal Grandmother     Social History Social History   Tobacco Use   Smoking status: Never   Smokeless tobacco: Never  Vaping Use   Vaping status: Never Used  Substance Use Topics   Alcohol use: No   Drug use: No     Allergies   Doxycycline , Fioricet [butalbital -apap-caffeine ], Inderal  [propranolol ], and Nsaids   Review of Systems Review of Systems  Constitutional:  Positive for activity change. Negative for appetite change, fatigue and fever.  Respiratory:  Negative for shortness of breath.   Cardiovascular:  Negative for chest pain.  Gastrointestinal:  Positive for abdominal pain, constipation and rectal pain. Negative for diarrhea, nausea and vomiting.     Physical Exam Triage Vital Signs ED Triage Vitals  Encounter Vitals Group     BP 06/26/24 1902 132/83     Girls Systolic BP Percentile --      Girls Diastolic BP Percentile --      Boys Systolic BP Percentile --      Boys Diastolic BP Percentile --      Pulse Rate 06/26/24 1902 77     Resp 06/26/24 1902 18     Temp 06/26/24 1902 98.3 F (36.8 C)     Temp src --      SpO2 06/26/24 1902 99 %     Weight --      Height --      Head Circumference --      Peak Flow --      Pain  Score 06/26/24 1857 7     Pain Loc --      Pain Education --      Exclude from Growth Chart --    No data found.  Updated Vital Signs BP 132/83   Pulse 77   Temp 98.3 F (36.8 C)   Resp 18   LMP  (LMP Unknown) Comment: PT has IUD not period  with IUD  SpO2 99%   Visual Acuity Right Eye Distance:   Left Eye Distance:   Bilateral Distance:    Right Eye Near:   Left Eye Near:    Bilateral Near:     Physical Exam Vitals reviewed.  Constitutional:      General: She is awake. She is not in acute distress.    Appearance: Normal appearance. She is well-developed. She is not ill-appearing.     Comments: Very pleasant female appears stated age in no acute distress sitting comfortably in exam room  HENT:     Head: Normocephalic and atraumatic.     Mouth/Throat:     Pharynx: Uvula midline. No oropharyngeal exudate or posterior oropharyngeal erythema.  Cardiovascular:     Rate and Rhythm: Normal rate and regular rhythm.     Heart sounds: Normal heart sounds, S1 normal and S2 normal. No murmur heard. Pulmonary:     Effort: Pulmonary effort is normal.     Breath sounds: Normal breath sounds. No wheezing, rhonchi or rales.     Comments: Clear to auscultation bilaterally Abdominal:     General: Bowel sounds are normal.     Palpations: Abdomen is  soft.     Tenderness: There is abdominal tenderness in the epigastric area, left upper quadrant and left lower quadrant. There is no right CVA tenderness, left CVA tenderness, guarding or rebound.  Genitourinary:    Rectum: Normal.     Comments: Significant stool noted in rectal vault with a small amount removed with digital disimpaction.  Aly, RT present as chaperone during exam. Psychiatric:        Behavior: Behavior is cooperative.      UC Treatments / Results  Labs (all labs ordered are listed, but only abnormal results are displayed) Labs Reviewed - No data to display  EKG   Radiology No results  found.  Procedures Procedures (including critical care time)  Medications Ordered in UC Medications - No data to display  Initial Impression / Assessment and Plan / UC Course  I have reviewed the triage vital signs and the nursing notes.  Pertinent labs & imaging results that were available during my care of the patient were reviewed by me and considered in my medical decision making (see chart for details).     Patient is well-appearing, afebrile, nontoxic, nontachycardic.  She did have a significant amount of stool in rectal vault only were able to remove a small amount of this with digital disimpaction.  Low suspicion for obstruction as she passed gas during her visit and had normal active bowel sounds on exam.  Will treat with mag citrate as well as glycerin  suppositories and we discussed that if she does not have a bowel movement within a few days or has any worsening or changing symptoms including severe abdominal pain, nausea, vomiting, blood in her stool she needs to go to the emergency room immediately.  We discussed that she should have a low threshold for going to the ER if she does not feel much better with this medication after having a bowel movement as we do not have CT capabilities in urgent care and she would likely need advanced imaging.  All questions were answered to patient satisfaction.  Strict return precautions given.  Excuse note provided.  Final Clinical Impressions(s) / UC Diagnoses   Final diagnoses:  Constipation, unspecified constipation type     Discharge Instructions      Take magnesium  citrate as prescribed.  You can also use glycerin  suppository to help with encouraging a bowel movement.  If you do not have a bowel movement within a few hours or if you have any worsening pain, fever, nausea, vomiting you need to go to the emergency room as we discussed.  Increase the fiber in your diet make sure you are drinking plenty of fluid.     ED Prescriptions      Medication Sig Dispense Auth. Provider   magnesium  citrate solution  (Status: Discontinued) Take 296 mLs by mouth once for 1 dose. 296 mL Keoshia Steinmetz K, PA-C   glycerin  adult 2 g suppository  (Status: Discontinued) Place 1 suppository rectally as needed for constipation. 12 suppository Kamdyn Covel K, PA-C   glycerin  adult 2 g suppository Place 1 suppository rectally as needed for constipation. 12 suppository Freddie Dymek K, PA-C   magnesium  citrate solution Take 296 mLs by mouth once for 1 dose. 296 mL Infinity Jeffords K, PA-C      PDMP not reviewed this encounter.   Sherrell Rocky POUR, PA-C 06/26/24 1933

## 2024-06-26 NOTE — Discharge Instructions (Signed)
 Take magnesium  citrate as prescribed.  You can also use glycerin  suppository to help with encouraging a bowel movement.  If you do not have a bowel movement within a few hours or if you have any worsening pain, fever, nausea, vomiting you need to go to the emergency room as we discussed.  Increase the fiber in your diet make sure you are drinking plenty of fluid.

## 2024-06-26 NOTE — ED Triage Notes (Addendum)
 PT reports she is impacted . Pt reports she is ADHD and is not sure when last BM was. Pt thinks maybe over a week. Pt reports rectal pain 7/10. Pt reports she tried stool softner with out relief.

## 2024-07-08 ENCOUNTER — Inpatient Hospital Stay

## 2024-07-08 VITALS — BP 110/75 | HR 80 | Temp 97.7°F | Resp 17

## 2024-07-08 DIAGNOSIS — E538 Deficiency of other specified B group vitamins: Secondary | ICD-10-CM

## 2024-07-08 DIAGNOSIS — D508 Other iron deficiency anemias: Secondary | ICD-10-CM

## 2024-07-08 MED ORDER — CYANOCOBALAMIN 1000 MCG/ML IJ SOLN
1000.0000 ug | Freq: Once | INTRAMUSCULAR | Status: AC
  Administered 2024-07-08: 1000 ug via INTRAMUSCULAR
  Filled 2024-07-08: qty 1

## 2024-07-24 ENCOUNTER — Other Ambulatory Visit: Payer: Self-pay | Admitting: Hematology and Oncology

## 2024-07-24 ENCOUNTER — Inpatient Hospital Stay: Admitting: Hematology and Oncology

## 2024-07-24 ENCOUNTER — Inpatient Hospital Stay: Attending: Hematology and Oncology

## 2024-07-24 ENCOUNTER — Inpatient Hospital Stay

## 2024-07-24 DIAGNOSIS — E538 Deficiency of other specified B group vitamins: Secondary | ICD-10-CM

## 2024-07-24 DIAGNOSIS — D508 Other iron deficiency anemias: Secondary | ICD-10-CM

## 2024-07-24 NOTE — Progress Notes (Deleted)
 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 Federico  12/20-12/27/2021: IV feraheme  510mg  q 7 days x 2 doses and 2 doses of IM Vitamin b12 1000mcg.  12/26/2020: WBC 4.3, Hgb 11.8, MCV 79.4, Plt 432 01/09/2022: Wbc 5.9, Hgb 12.1, MCV 77.2, Plt 544 09/28/2022: WBC 4.7, Hgb 11.2, MCV 79.2, Plt 365 12/25/2022: WBC 4.5, Hgb 11.2, MCV 79.2, Plt 454  03/26/2023: WBC 4.5, Hgb 11.2, MCV 79.8, Plt 471  Interval History:  Meredith Byrd 43 y.o. female with medical history significant for iron deficiency anemia 2/2 to gastric bipass surgery who presents for a follow up visit. The patient's last visit was on 01/10/2024. In the interim since the last visit she has had no major changes in her health.   On exam today Mrs. Laprise reports she continues to feel unwell with low energy levels.  She reports her energy today is about a 3 out of 10.  She is not having any associated lightheadedness, dizziness, or shortness of breath.  She reports that she does have occasional headaches but no vision changes.  She reports that she continues getting her vitamin B12 shots every 2 weeks and is tolerating it well.  She notes that her appetite remains good and she has had no recent infections such as fevers, chills, sweats.  She denies any runny nose, sore throat, cough.  Otherwise she feels well and has no recent changes in her health.  A full 10  point ROS is otherwise negative.  MEDICAL HISTORY:  Past Medical History:  Diagnosis Date   Allergy    Anemia    Anxiety    Arthritis pain 08/20/2014   Back pain    COVID-19    GERD (gastroesophageal reflux disease)    History of stress test 02/2011 ([facility])   there was no evidence of ischemia, but she only went 3 1/2 minutes on the treadmill making it very difficult to get a good accurate assessment, however   Hx of echocardiogram    The echocadiogram was essentially normal with the exception of mild mitral calcification and borderline concentric LVH, which in the setting of her hypertension at this early age is something that mean her blood pressure is well controlled.    Hypertension    Iron deficiency anemia, unspecified 07/09/2014   Migraines    Palpitations    Swelling    Vitamin B12 deficiency 03/17/2016    SURGICAL HISTORY: Past Surgical History:  Procedure Laterality Date   BREAST SURGERY     CARPAL TUNNEL RELEASE Right 09/24/2021   Procedure: RIGHT CARPAL TUNNEL RELEASE;  Surgeon: Jerri Kay HERO, MD;  Location: [facility];  Service: Orthopedics;  Laterality: Right;   CHOLECYSTECTOMY     GASTRIC BYPASS  2008   OOPHORECTOMY Right 2010   REDUCTION MAMMAPLASTY Bilateral     SOCIAL HISTORY: Social History   Socioeconomic History   Marital status: Married    Spouse name: Damon   Number of children:  4   Years of education: Associates   Highest education level: Not on file  Occupational History   Occupation: Insurance Claims  Tobacco Use   Smoking status: Never   Smokeless tobacco: Never  Vaping Use   Vaping status: Never Used  Substance and Sexual Activity   Alcohol use: No   Drug use: No   Sexual activity: Yes  Other Topics Concern   Not on file  Social History Narrative   Right handed.   Lives at home with husband and four children.   No caffeine  use.   Social Drivers of Corporate investment banker Strain: Not on file  Food Insecurity:  Not on file  Transportation Needs: Not on file  Physical Activity: Not on file  Stress: Not on file  Social Connections: Not on file  Intimate Partner Violence: Not on file    FAMILY HISTORY: Family History  Problem Relation Age of Onset   Asthma Mother    Heart disease Mother    Diabetes Mother    Early death Mother    Hypertension Mother    Thyroid  disease Mother    Anxiety disorder Mother    Hypertension Father    Heart disease Father    Breast cancer Sister 48   Breast cancer Maternal Aunt        under 40   Breast cancer Paternal Aunt    Stroke Maternal Grandmother    Heart disease Maternal Grandmother    Hypertension Maternal Grandmother    Hyperlipidemia Maternal Grandmother    Breast cancer Maternal Grandmother     ALLERGIES:  is allergic to doxycycline , fioricet [butalbital -apap-caffeine ], inderal  [propranolol ], and nsaids.  MEDICATIONS:  Current Outpatient Medications  Medication Sig Dispense Refill   acetaminophen  (TYLENOL ) 500 MG tablet Take 2 tablets (1,000 mg total) by mouth every 6 (six) hours as needed. 30 tablet 0   acetaminophen -codeine  (TYLENOL  #3) 300-30 MG tablet Take 1 tablet by mouth every 8 (eight) hours as needed for moderate pain (pain score 4-6) or severe pain (pain score 7-10). 20 tablet 0   albuterol  (VENTOLIN  HFA) 108 (90 Base) MCG/ACT inhaler Inhale 1-2 puffs into the lungs every 6 (six) hours as needed for wheezing or shortness of breath. 18 g 0   cloNIDine (CATAPRES) 0.2 MG tablet Take 0.4 mg by mouth at bedtime.     Cyanocobalamin  (B-12 COMPLIANCE INJECTION) 1000 MCG/ML KIT Inject as directed.     Galcanezumab -gnlm (EMGALITY ) 120 MG/ML SOSY 240 mg subQ (2 consecutive 120-mg doses) once as a loading dose, followed by 120 mg once monthly 3 mL 6   glycerin  adult 2 g suppository Place 1 suppository rectally as needed for constipation. 12 suppository 0   montelukast (SINGULAIR) 10 MG tablet Take 10 mg by mouth at bedtime.     ondansetron   (ZOFRAN ) 4 MG tablet Take 1-2 tablets (4-8 mg total) by mouth every 8 (eight) hours as needed for nausea or vomiting. 10 tablet 0   traMADol  (ULTRAM ) 50 MG tablet Take 1 tablet (50 mg total) by mouth daily as needed. 20 tablet 0   traMADol  (ULTRAM ) 50 MG tablet Take 1 tablet (50 mg total) by mouth 3 (three) times daily as needed. 30 tablet 0   Ubrogepant  (UBRELVY ) 100 MG TABS Take 1 tablet at the onset of migraine. Can repeat in 2 hours if needed. Only 2 tabs/24 hours 12 tablet 11   UNABLE TO FIND Med Name: allergy injections  3 times a week     verapamil  (  CALAN -SR) 120 MG CR tablet Take 1 tablet (120 mg total) by mouth at bedtime. 30 tablet 11   Vitamin D , Ergocalciferol , (DRISDOL ) 1.25 MG (50000 UNIT) CAPS capsule TAKE 1 CAPSULE BY MOUTH EVERY 7 DAYS 12 capsule 0   No current facility-administered medications for this visit.    REVIEW OF SYSTEMS:   Constitutional: ( - ) fevers, ( - )  chills , ( - ) night sweats Eyes: ( - ) blurriness of vision, ( - ) double vision, ( - ) watery eyes Ears, nose, mouth, throat, and face: ( - ) mucositis, ( - ) sore throat Respiratory: ( - ) cough, ( - ) dyspnea, ( - ) wheezes Cardiovascular: ( - ) palpitation, ( - ) chest discomfort, ( - ) lower extremity swelling Gastrointestinal:  ( - ) nausea, ( - ) heartburn, ( - ) change in bowel habits Skin: ( - ) abnormal skin rashes Lymphatics: ( - ) new lymphadenopathy, ( - ) easy bruising Neurological: ( - ) numbness, ( - ) tingling, ( - ) new weaknesses Behavioral/Psych: ( - ) mood change, ( - ) new changes  All other systems were reviewed with the patient and are negative.  PHYSICAL EXAMINATION:  There were no vitals filed for this visit.       There were no vitals filed for this visit.      GENERAL: tired appearing middle aged [place] American female. alert, no distress and comfortable SKIN: skin color, texture, turgor are normal, no rashes or significant lesions EYES: conjunctiva are pink and  non-injected, sclera clear LUNGS: clear to auscultation and percussion with normal breathing effort HEART: regular rate & rhythm and no murmurs and no lower extremity edema Musculoskeletal: no cyanosis of digits and no clubbing  PSYCH: alert & oriented x 3, fluent speech NEURO: no focal motor/sensory deficits  LABORATORY DATA:  I have reviewed the data as listed    Latest Ref Rng & Units 04/24/2024    2:53 PM 01/10/2024    3:04 PM 10/04/2023    2:24 PM  CBC  WBC 4.0 - 10.5 K/uL 6.2  4.5  6.1   Hemoglobin 12.0 - 15.0 g/dL 86.9  88.5  87.5   Hematocrit 36.0 - 46.0 % 40.8  36.6  36.8   Platelets 150 - 400 K/uL 562  452  425        Latest Ref Rng & Units 04/24/2024    2:53 PM 01/10/2024    3:04 PM 10/04/2023    2:24 PM  CMP  Glucose 70 - 99 mg/dL 87  92  98   BUN 6 - 20 mg/dL 12  8  6    Creatinine 0.44 - 1.00 mg/dL 9.28  9.17  9.41   Sodium 135 - 145 mmol/L 140  140  139   Potassium 3.5 - 5.1 mmol/L 3.8  3.9  3.6   Chloride 98 - 111 mmol/L 109  107  106   CO2 22 - 32 mmol/L 25  27  25    Calcium 8.9 - 10.3 mg/dL 9.6  8.9  9.4   Total Protein 6.5 - 8.1 g/dL 7.9  7.2  7.2   Total Bilirubin 0.0 - 1.2 mg/dL 0.4  0.4  0.4   Alkaline Phos 38 - 126 U/L 71  59  66   AST 15 - 41 U/L 21  19  36   ALT 0 - 44 U/L 35  26  59     RADIOGRAPHIC STUDIES: No  results found.  ASSESSMENT & PLAN Jania M Bugge 43 y.o. female with medical history significant for iron deficiency anemia 2/2 to gastric bipass surgery who presents for a follow up visit.  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  -- labs today show WBC 6.2, hemoglobin 13.0, MCV 76.3, platelets 562.  Iron studies pending. --Currently receiving IM B12 injection 1000 mcg q 2 weeks. Recommend to continue. -- If  iron levels are low will plan to proceed with IV iron. --Return to clinic in 3 months with interval vitamin B12 injections every 2 weeks.  #Fatigue, stable --Patient still has marked fatigue despite adequate repletion of iron and vitamin B12.   --normal TSH and hemoglobin A1c from 12/26/2020 --Folate levels have normalized from 03/21/2021 --H/O of vitamin D  deficiency so currently takes vitamin D  supplementation.    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   No orders of the defined types were placed in this encounter.  All questions were answered. The patient knows to call the clinic with any problems, questions or concerns.  I have spent a total of 30 minutes minutes of face-to-face and non-face-to-face time, preparing to see the patient, performing a medically appropriate examination, counseling and educating the patient,  documenting clinical information in the electronic health record, and care coordination.   Norleen IVAR Kidney, MD Department of Hematology/Oncology [facility] Phone: [phone] Pager: [phone] Email: [email or web address]   07/24/2024 7:46 AM

## 2024-08-05 ENCOUNTER — Other Ambulatory Visit: Payer: Self-pay | Admitting: Adult Health

## 2024-08-14 ENCOUNTER — Encounter: Payer: Self-pay | Admitting: Radiology

## 2024-09-03 ENCOUNTER — Other Ambulatory Visit: Payer: Self-pay | Admitting: Hematology and Oncology

## 2024-09-03 DIAGNOSIS — D508 Other iron deficiency anemias: Secondary | ICD-10-CM

## 2024-09-03 DIAGNOSIS — E538 Deficiency of other specified B group vitamins: Secondary | ICD-10-CM

## 2024-09-03 DIAGNOSIS — D563 Thalassemia minor: Secondary | ICD-10-CM

## 2024-09-03 NOTE — Progress Notes (Unsigned)
 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 Federico  12/20-12/27/2021: IV feraheme  510mg  q 7 days x 2 doses and 2 doses of IM Vitamin b12 1000mcg.  12/26/2020: WBC 4.3, Hgb 11.8, MCV 79.4, Plt 432 01/09/2022: Wbc 5.9, Hgb 12.1, MCV 77.2, Plt 544 09/28/2022: WBC 4.7, Hgb 11.2, MCV 79.2, Plt 365 12/25/2022: WBC 4.5, Hgb 11.2, MCV 79.2, Plt 454  03/26/2023: WBC 4.5, Hgb 11.2, MCV 79.8, Plt 471  Interval History:  Meredith Byrd 43 y.o. female with medical history significant for iron deficiency anemia 2/2 to gastric bipass surgery who presents for a follow up visit. The patient's last visit was on 04/24/2024. In the interim since the last visit she has had no major changes in her health.   On exam today Meredith Byrd reports her energy today is still the same at about a 4 out of 10.  She is not having any lightheadedness, dizziness, shortness of breath.  She reports her appetite is good.  She has had no trouble with fevers, chills, sweats, nausea, or diarrhea.  She reports she stopped getting her B12 shots every every 2 weeks and has not had a shot since July.  She reports that she is been going through personal hardships as she is going through a divorce.  She reports he is having no overt signs of bleeding, bruising, or dark stools.  She is on birth control therapy which is well controlling her menstrual cycles.  She is  not having any major bleeding but does have some occasional nosebleeds.  She reports that she has had no other changes in her medications.  Overall she is willing and able to restart her vitamin B12 injections.  Full 10 point ROS otherwise negative.  MEDICAL HISTORY:  Past Medical History:  Diagnosis Date   Allergy    Anemia    Anxiety    Arthritis pain 08/20/2014   Back pain    COVID-19    GERD (gastroesophageal reflux disease)    History of stress test 02/2011 ([facility])   there was no evidence of ischemia, but she only went 3 1/2 minutes on the treadmill making it very difficult to get a good accurate assessment, however   Hx of echocardiogram    The echocadiogram was essentially normal with the exception of mild mitral calcification and borderline concentric LVH, which in the setting of her hypertension at this early age is something that mean her blood pressure is well controlled.    Hypertension    Iron deficiency anemia, unspecified 07/09/2014   Migraines    Palpitations    Swelling    Vitamin B12 deficiency 03/17/2016    SURGICAL HISTORY: Past Surgical History:  Procedure Laterality Date   BREAST SURGERY     CARPAL TUNNEL RELEASE Right 09/24/2021   Procedure: RIGHT CARPAL TUNNEL RELEASE;  Surgeon: Jerri Kay HERO, MD;  Location: [facility];  Service: Orthopedics;  Laterality: Right;   CHOLECYSTECTOMY  GASTRIC BYPASS  2008   OOPHORECTOMY Right 2010   REDUCTION MAMMAPLASTY Bilateral     SOCIAL HISTORY: Social History   Socioeconomic History   Marital status: Married    Spouse name: Meredith Byrd   Number of children: 4   Years of education: Associates   Highest education level: Not on file  Occupational History   Occupation: Insurance Claims  Tobacco Use   Smoking status: Never   Smokeless tobacco: Never  Vaping Use   Vaping status: Never Used  Substance and Sexual Activity   Alcohol use: No   Drug use: No   Sexual activity: Yes  Other Topics Concern    Not on file  Social History Narrative   Right handed.   Lives at home with husband and four children.   No caffeine  use.   Social Drivers of Corporate Investment Banker Strain: Not on file  Food Insecurity: Not on file  Transportation Needs: Not on file  Physical Activity: Not on file  Stress: Not on file  Social Connections: Not on file  Intimate Partner Violence: Not on file    FAMILY HISTORY: Family History  Problem Relation Age of Onset   Asthma Mother    Heart disease Mother    Diabetes Mother    Early death Mother    Hypertension Mother    Thyroid  disease Mother    Anxiety disorder Mother    Hypertension Father    Heart disease Father    Breast cancer Sister 54   Breast cancer Maternal Aunt        under 12   Breast cancer Paternal Aunt    Stroke Maternal Grandmother    Heart disease Maternal Grandmother    Hypertension Maternal Grandmother    Hyperlipidemia Maternal Grandmother    Breast cancer Maternal Grandmother     ALLERGIES:  is allergic to doxycycline , fioricet [butalbital -apap-caffeine ], inderal  [propranolol ], and nsaids.  MEDICATIONS:  Current Outpatient Medications  Medication Sig Dispense Refill   acetaminophen  (TYLENOL ) 500 MG tablet Take 2 tablets (1,000 mg total) by mouth every 6 (six) hours as needed. 30 tablet 0   acetaminophen -codeine  (TYLENOL  #3) 300-30 MG tablet Take 1 tablet by mouth every 8 (eight) hours as needed for moderate pain (pain score 4-6) or severe pain (pain score 7-10). 20 tablet 0   albuterol  (VENTOLIN  HFA) 108 (90 Base) MCG/ACT inhaler Inhale 1-2 puffs into the lungs every 6 (six) hours as needed for wheezing or shortness of breath. 18 g 0   cloNIDine (CATAPRES) 0.2 MG tablet Take 0.4 mg by mouth at bedtime.     Cyanocobalamin  (B-12 COMPLIANCE INJECTION) 1000 MCG/ML KIT Inject as directed.     Galcanezumab -gnlm (EMGALITY ) 120 MG/ML SOSY 240 mg subQ (2 consecutive 120-mg doses) once as a loading dose, followed by 120 mg once  monthly 3 mL 6   glycerin  adult 2 g suppository Place 1 suppository rectally as needed for constipation. 12 suppository 0   montelukast (SINGULAIR) 10 MG tablet Take 10 mg by mouth at bedtime.     ondansetron  (ZOFRAN ) 4 MG tablet Take 1-2 tablets (4-8 mg total) by mouth every 8 (eight) hours as needed for nausea or vomiting. 10 tablet 0   traMADol  (ULTRAM ) 50 MG tablet Take 1 tablet (50 mg total) by mouth daily as needed. 20 tablet 0   traMADol  (ULTRAM ) 50 MG tablet Take 1 tablet (50 mg total) by mouth 3 (three) times daily as needed. 30 tablet 0   Ubrogepant  (UBRELVY ) 100 MG  TABS Take 1 tablet at the onset of migraine. Can repeat in 2 hours if needed. Only 2 tabs/24 hours 12 tablet 11   UNABLE TO FIND Med Name: allergy injections  3 times a week     verapamil  (CALAN -SR) 120 MG CR tablet Take 1 tablet (120 mg total) by mouth at bedtime. 30 tablet 11   Vitamin D , Ergocalciferol , (DRISDOL ) 1.25 MG (50000 UNIT) CAPS capsule TAKE 1 CAPSULE BY MOUTH EVERY 7 DAYS 12 capsule 0   No current facility-administered medications for this visit.    REVIEW OF SYSTEMS:   Constitutional: ( - ) fevers, ( - )  chills , ( - ) night sweats Eyes: ( - ) blurriness of vision, ( - ) double vision, ( - ) watery eyes Ears, nose, mouth, throat, and face: ( - ) mucositis, ( - ) sore throat Respiratory: ( - ) cough, ( - ) dyspnea, ( - ) wheezes Cardiovascular: ( - ) palpitation, ( - ) chest discomfort, ( - ) lower extremity swelling Gastrointestinal:  ( - ) nausea, ( - ) heartburn, ( - ) change in bowel habits Skin: ( - ) abnormal skin rashes Lymphatics: ( - ) new lymphadenopathy, ( - ) easy bruising Neurological: ( - ) numbness, ( - ) tingling, ( - ) new weaknesses Behavioral/Psych: ( - ) mood change, ( - ) new changes  All other systems were reviewed with the patient and are negative.  PHYSICAL EXAMINATION:  There were no vitals filed for this visit.       There were no vitals filed for this  visit.      GENERAL: tired appearing middle aged African American female. alert, no distress and comfortable SKIN: skin color, texture, turgor are normal, no rashes or significant lesions EYES: conjunctiva are pink and non-injected, sclera clear LUNGS: clear to auscultation and percussion with normal breathing effort HEART: regular rate & rhythm and no murmurs and no lower extremity edema Musculoskeletal: no cyanosis of digits and no clubbing  PSYCH: alert & oriented x 3, fluent speech NEURO: no focal motor/sensory deficits  LABORATORY DATA:  I have reviewed the data as listed    Latest Ref Rng & Units 04/24/2024    2:53 PM 01/10/2024    3:04 PM 10/04/2023    2:24 PM  CBC  WBC 4.0 - 10.5 K/uL 6.2  4.5  6.1   Hemoglobin 12.0 - 15.0 g/dL 86.9  88.5  87.5   Hematocrit 36.0 - 46.0 % 40.8  36.6  36.8   Platelets 150 - 400 K/uL 562  452  425        Latest Ref Rng & Units 04/24/2024    2:53 PM 01/10/2024    3:04 PM 10/04/2023    2:24 PM  CMP  Glucose 70 - 99 mg/dL 87  92  98   BUN 6 - 20 mg/dL 12  8  6    Creatinine 0.44 - 1.00 mg/dL 9.28  9.17  9.41   Sodium 135 - 145 mmol/L 140  140  139   Potassium 3.5 - 5.1 mmol/L 3.8  3.9  3.6   Chloride 98 - 111 mmol/L 109  107  106   CO2 22 - 32 mmol/L 25  27  25    Calcium 8.9 - 10.3 mg/dL 9.6  8.9  9.4   Total Protein 6.5 - 8.1 g/dL 7.9  7.2  7.2   Total Bilirubin 0.0 - 1.2 mg/dL 0.4  0.4  0.4  Alkaline Phos 38 - 126 U/L 71  59  66   AST 15 - 41 U/L 21  19  36   ALT 0 - 44 U/L 35  26  59     RADIOGRAPHIC STUDIES: No results found.  ASSESSMENT & PLAN Meredith Byrd 43 y.o. female with medical history significant for iron deficiency anemia 2/2 to gastric bipass surgery who presents for a follow up visit.  # Iron Deficiency Anemia 2/2 to Gastric Bipass Surgery #Vitamin B12 Deficiency 2/2 to Gastric Bipass Surgery --Findings are most consistent with iron deficiency anemia/Vitamin B12 deficiency secondary to poor absorption in  the setting of gastric bypass surgery.  -- labs today show WBC 5.8, hemoglobin 10.8, MCV 76.9, platelets 443.  Iron studies pending. --Currently receiving IM B12 injection 1000 mcg q 2 weeks. Recommend to continue. -- If iron levels are low will plan to proceed with IV iron. --Return to clinic in 3 months with interval vitamin B12 injections every 2 weeks.  #Fatigue, stable --Patient still has marked fatigue despite adequate repletion of iron and vitamin B12.   --normal TSH and hemoglobin A1c from 12/26/2020 --Folate levels have normalized from 03/21/2021 --H/O of vitamin D  deficiency so currently takes vitamin D  supplementation.    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   No orders of the defined types were placed in this encounter.  All questions were answered. The patient knows to call the clinic with any problems, questions or concerns.  I have spent a total of 30 minutes minutes of face-to-face and non-face-to-face time, preparing to see the patient, performing a medically appropriate examination, counseling and educating the patient,  documenting clinical information in the electronic health record, and care coordination.   Norleen IVAR Kidney, MD Department of Hematology/Oncology [facility] Phone: [phone] Pager: [phone] Email: [email or web address]   09/03/2024 7:15 PM

## 2024-09-04 ENCOUNTER — Inpatient Hospital Stay (HOSPITAL_BASED_OUTPATIENT_CLINIC_OR_DEPARTMENT_OTHER): Admitting: Hematology and Oncology

## 2024-09-04 ENCOUNTER — Inpatient Hospital Stay

## 2024-09-04 ENCOUNTER — Inpatient Hospital Stay: Attending: Hematology and Oncology

## 2024-09-04 VITALS — BP 116/78 | HR 66 | Temp 98.7°F | Resp 14 | Wt 170.8 lb

## 2024-09-04 DIAGNOSIS — E538 Deficiency of other specified B group vitamins: Secondary | ICD-10-CM

## 2024-09-04 DIAGNOSIS — D563 Thalassemia minor: Secondary | ICD-10-CM

## 2024-09-04 DIAGNOSIS — D508 Other iron deficiency anemias: Secondary | ICD-10-CM

## 2024-09-04 LAB — CBC WITH DIFFERENTIAL (CANCER CENTER ONLY)
Abs Immature Granulocytes: 0.01 K/uL (ref 0.00–0.07)
Basophils Absolute: 0 K/uL (ref 0.0–0.1)
Basophils Relative: 1 %
Eosinophils Absolute: 0.1 K/uL (ref 0.0–0.5)
Eosinophils Relative: 2 %
HCT: 33.9 % — ABNORMAL LOW (ref 36.0–46.0)
Hemoglobin: 10.8 g/dL — ABNORMAL LOW (ref 12.0–15.0)
Immature Granulocytes: 0 %
Lymphocytes Relative: 25 %
Lymphs Abs: 1.5 K/uL (ref 0.7–4.0)
MCH: 24.5 pg — ABNORMAL LOW (ref 26.0–34.0)
MCHC: 31.9 g/dL (ref 30.0–36.0)
MCV: 76.9 fL — ABNORMAL LOW (ref 80.0–100.0)
Monocytes Absolute: 0.3 K/uL (ref 0.1–1.0)
Monocytes Relative: 5 %
Neutro Abs: 3.9 K/uL (ref 1.7–7.7)
Neutrophils Relative %: 67 %
Platelet Count: 443 K/uL — ABNORMAL HIGH (ref 150–400)
RBC: 4.41 MIL/uL (ref 3.87–5.11)
RDW: 16.1 % — ABNORMAL HIGH (ref 11.5–15.5)
WBC Count: 5.8 K/uL (ref 4.0–10.5)
nRBC: 0 % (ref 0.0–0.2)

## 2024-09-04 LAB — CMP (CANCER CENTER ONLY)
ALT: 13 U/L (ref 0–44)
AST: 18 U/L (ref 15–41)
Albumin: 4.1 g/dL (ref 3.5–5.0)
Alkaline Phosphatase: 68 U/L (ref 38–126)
Anion gap: 9 (ref 5–15)
BUN: 7 mg/dL (ref 6–20)
CO2: 23 mmol/L (ref 22–32)
Calcium: 9.4 mg/dL (ref 8.9–10.3)
Chloride: 107 mmol/L (ref 98–111)
Creatinine: 0.59 mg/dL (ref 0.44–1.00)
GFR, Estimated: >60 mL/min (ref >60)
Glucose, Bld: 85 mg/dL (ref 70–99)
Potassium: 4.1 mmol/L (ref 3.5–5.1)
Sodium: 140 mmol/L (ref 135–145)
Total Bilirubin: 0.4 mg/dL (ref 0.0–1.2)
Total Protein: 7.4 g/dL (ref 6.5–8.1)

## 2024-09-04 LAB — FERRITIN: Ferritin: 337 ng/mL — ABNORMAL HIGH (ref 11–307)

## 2024-09-04 LAB — IRON AND IRON BINDING CAPACITY (CC-WL,HP ONLY)
Iron: 77 ug/dL (ref 28–170)
Saturation Ratios: 22 % (ref 10.4–31.8)
TIBC: 347 ug/dL (ref 250–450)
UIBC: 270 ug/dL

## 2024-09-04 LAB — RETIC PANEL
Immature Retic Fract: 14.6 % (ref 2.3–15.9)
RBC.: 4.34 MIL/uL (ref 3.87–5.11)
Retic Count, Absolute: 58.6 K/uL (ref 19.0–186.0)
Retic Ct Pct: 1.4 % (ref 0.4–3.1)
Reticulocyte Hemoglobin: 25.1 pg — ABNORMAL LOW (ref >27.9)

## 2024-09-04 LAB — FOLATE: Folate: >20 ng/mL (ref >5.9)

## 2024-09-04 LAB — VITAMIN B12: Vitamin B-12: 713 pg/mL (ref 180–914)

## 2024-09-04 MED ORDER — CYANOCOBALAMIN 1000 MCG/ML IJ SOLN
1000.0000 ug | Freq: Once | INTRAMUSCULAR | Status: AC
  Administered 2024-09-04: 1000 ug via INTRAMUSCULAR
  Filled 2024-09-04: qty 1

## 2024-09-05 ENCOUNTER — Encounter: Payer: Self-pay | Admitting: Hematology and Oncology

## 2024-09-08 LAB — METHYLMALONIC ACID, SERUM: Methylmalonic Acid, Quantitative: 122 nmol/L (ref 0–378)

## 2024-09-23 ENCOUNTER — Inpatient Hospital Stay: Attending: Hematology and Oncology

## 2024-09-23 DIAGNOSIS — E538 Deficiency of other specified B group vitamins: Secondary | ICD-10-CM | POA: Insufficient documentation

## 2024-09-26 ENCOUNTER — Encounter: Payer: Self-pay | Admitting: Physician Assistant

## 2024-09-26 ENCOUNTER — Ambulatory Visit: Admitting: Physician Assistant

## 2024-09-26 DIAGNOSIS — M65332 Trigger finger, left middle finger: Secondary | ICD-10-CM | POA: Insufficient documentation

## 2024-09-26 MED ORDER — METHYLPREDNISOLONE ACETATE 40 MG/ML IJ SUSP
20.0000 mg | INTRAMUSCULAR | Status: AC | PRN
  Administered 2024-09-26: 16:00:00 20 mg

## 2024-09-26 MED ORDER — LIDOCAINE HCL 1 % IJ SOLN
0.5000 mL | INTRAMUSCULAR | Status: AC | PRN
  Administered 2024-09-26: 16:00:00 .5 mL

## 2024-09-26 NOTE — Progress Notes (Signed)
 Office Visit Note   Patient: Meredith Byrd           Date of Birth: September 26, 1981           MRN: [id number] Visit Date: 09/26/2024              Requested by: Gerome Brunet, DO [address] [place],  [place] [address] PCP: Gerome Brunet, DO  Chief Complaint  Patient presents with   Left Hand - Pain      HPI: Patient is a pleasant 43 year old woman who has been followed by Dr. Jerri in the past.  She is status post carpal tunnel release on the right as well as a trigger finger release.  She works on a animator.  Uses her hands quite a bit.  Comes in today with painful triggering of her left long finger no injury  Assessment & Plan: Visit Diagnoses:  1. Trigger finger, left middle finger     Plan: Will go ahead and try an injection today.  She realizes if this does not solve the problem she should follow-up with Dr. Jerri  Follow-Up Instructions: No follow-ups on file.   Ortho Exam  Patient is alert, oriented, no adenopathy, well-dressed, normal affect, normal respiratory effort. Examination she has no effusion no erythema pulses are intact she has brisk capillary refill she has painful triggering of the long finger.    Imaging: No results found. No images are attached to the encounter.  Labs: Lab Results  Component Value Date   HGBA1C 4.6 (L) 12/26/2020   HGBA1C 4.8 06/29/2017   ESRSEDRATE 19 05/28/2020   ESRSEDRATE 20 01/19/2017   ESRSEDRATE 44 (H) 08/15/2014   LABURIC 3.4 05/28/2020     Lab Results  Component Value Date   ALBUMIN 4.1 09/04/2024   ALBUMIN 4.5 04/24/2024   ALBUMIN 4.1 01/10/2024    No results found for: MG Lab Results  Component Value Date   VD25OH 11.15 (L) 01/09/2022   VD25OH 8.17 (L) 08/22/2018   VD25OH 16.7 (L) 06/29/2017    No results found for: PREALBUMIN    Latest Ref Rng & Units 09/04/2024   10:42 AM 04/24/2024    2:53 PM 01/10/2024    3:05 PM  CBC EXTENDED  WBC 4.0 - 10.5 K/uL 5.8  6.2    RBC 3.87 -  5.11 MIL/uL 3.87 - 5.11 MIL/uL 4.34    4.41  5.31    5.35  4.53   Hemoglobin 12.0 - 15.0 g/dL 89.1  86.9    HCT 63.9 - 46.0 % 33.9  40.8    Platelets 150 - 400 K/uL 443  562    NEUT# 1.7 - 7.7 K/uL 3.9  4.1    Lymph# 0.7 - 4.0 K/uL 1.5  1.8       There is no height or weight on file to calculate BMI.  Orders:  No orders of the defined types were placed in this encounter.  No orders of the defined types were placed in this encounter.    Procedures: Hand/UE Inj: L long A1 for trigger finger on 09/26/2024 4:21 PM Indications: diagnostic and therapeutic Details: 25 G needle Medications: 0.5 mL lidocaine  1 %; 20 mg methylPREDNISolone  acetate 40 MG/ML Outcome: tolerated well, no immediate complications Procedure, treatment alternatives, risks and benefits explained, specific risks discussed. Consent was given by the patient.     Clinical Data: No additional findings.  ROS:  All other systems negative, except as noted in the HPI. Review of  Systems  Objective: Vital Signs: There were no vitals taken for this visit.  Specialty Comments:  CLINICAL DATA:  Low back pain   EXAM: MRI LUMBAR SPINE WITHOUT CONTRAST   TECHNIQUE: Multiplanar, multisequence MR imaging of the lumbar spine was performed. No intravenous contrast was administered.   COMPARISON:  None Available.   FINDINGS: Segmentation: Partial sacralization of the L5 vertebra.   Alignment:  Physiologic lumbar alignment is maintained.   Vertebrae: Vertebral bodies demonstrate normal signal intensity. No compression fractures.   Conus medullaris and cauda equina: The conus medullaris terminates at the level of L1-L2. The distal spinal cord signal intensity is normal.   Paraspinal and other soft tissues: The visualized abdomen and pelvis show no soft tissue abnormality. The visualized aorta is normal.   Disc levels:   L1-L2: Disc is normal in configuration. Moderate bilateral facet arthropathy. No  neuroforaminal stenosis. No spinal canal stenosis.   L2-L3: Disc bulge. Severe bilateral facet arthropathy. Mild bilateral neuroforaminal stenosis. Mild spinal canal stenosis.   L3-L4: Disc bulge. Severe bilateral facet arthropathy. No neuroforaminal stenosis. No spinal canal stenosis.   L4-L5: Disc bulge. Moderate bilateral facet arthropathy. No neuroforaminal stenosis. No spinal canal stenosis.   L5-S1: Disc is normal in configuration. No facet arthropathy. No neuroforaminal stenosis. No spinal canal stenosis.   IMPRESSION: 1. 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 2. Mild canal and foraminal stenoses at L2-L3 secondary to disc bulging and facet arthropathy. 3. Advanced facet arthropathy throughout the lumbar spine.     Electronically Signed   By: Clem Savory M.D.   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   On Depo-Provera  for contraception 05/31/2019   Chronic insomnia 05/17/2019   Lumbar facet arthropathy 12/14/2018   Dysmenorrhea, unspecified 05/16/2018   Backache 01/26/2018   Knee pain, left 11/17/2017   Generalized anxiety disorder 10/04/2017   Morning headache 05/04/2017   Daytime somnolence 05/04/2017   Class 3 obesity with body mass index (BMI) of 45.0 to 49.9 in adult 03/28/2017   Chronic low back pain 01/19/2017   Vitamin D  deficiency 01/18/2017   Vitamin B12 deficiency 03/17/2016   Beta thalassemia trait 03/25/2015   Intractable migraine without aura and without status migrainosus 11/23/2014   Elevated sed rate 08/20/2014    Arthritis pain 08/20/2014   Iron deficiency anemia 07/09/2014   Symptomatic anemia 06/05/2014   Chest pain 05/01/2014   GERD (gastroesophageal reflux disease) 03/22/2014   S/P gastric bypass 03/22/2014   Insomnia 03/22/2014   Migraine 03/22/2014   Palpitations 03/22/2014   Past Medical History:  Diagnosis Date   Allergy    Anemia    Anxiety    Arthritis pain 08/20/2014   Back pain    COVID-19    GERD (gastroesophageal reflux disease)    History of stress test 02/2011 ([facility])   there was no evidence of ischemia, but she only went 3 1/2 minutes on the treadmill making it very difficult to get a good accurate assessment, however   Hx of echocardiogram    The echocadiogram was essentially normal with the exception of mild mitral calcification and borderline  concentric LVH, which in the setting of her hypertension at this early age is something that mean her blood pressure is well controlled.    Hypertension    Iron deficiency anemia, unspecified 07/09/2014   Migraines    Palpitations    Swelling    Vitamin B12 deficiency 03/17/2016    Family History  Problem Relation Age of Onset   Asthma Mother    Heart disease Mother    Diabetes Mother    Early death Mother    Hypertension Mother    Thyroid  disease Mother    Anxiety disorder Mother    Hypertension Father    Heart disease Father    Breast cancer Sister 25   Breast cancer Maternal Aunt        under 73   Breast cancer Paternal Aunt    Stroke Maternal Grandmother    Heart disease Maternal Grandmother    Hypertension Maternal Grandmother    Hyperlipidemia Maternal Grandmother    Breast cancer Maternal Grandmother     Past Surgical History:  Procedure Laterality Date   BREAST SURGERY     CARPAL TUNNEL RELEASE Right 09/24/2021   Procedure: RIGHT CARPAL TUNNEL RELEASE;  Surgeon: Jerri Kay HERO, MD;  Location: [facility];  Service: Orthopedics;  Laterality: Right;    CHOLECYSTECTOMY     GASTRIC BYPASS  2008   OOPHORECTOMY Right 2010   REDUCTION MAMMAPLASTY Bilateral    Social History   Occupational History   Occupation: Human Resources Officer  Tobacco Use   Smoking status: Never   Smokeless tobacco: Never  Vaping Use   Vaping status: Never Used  Substance and Sexual Activity   Alcohol use: No   Drug use: No   Sexual activity: Yes

## 2024-10-07 ENCOUNTER — Inpatient Hospital Stay

## 2024-10-07 VITALS — BP 112/69 | HR 66 | Temp 98.0°F | Resp 16

## 2024-10-07 DIAGNOSIS — D508 Other iron deficiency anemias: Secondary | ICD-10-CM

## 2024-10-07 DIAGNOSIS — E538 Deficiency of other specified B group vitamins: Secondary | ICD-10-CM | POA: Diagnosis present

## 2024-10-07 MED ORDER — CYANOCOBALAMIN 1000 MCG/ML IJ SOLN
1000.0000 ug | Freq: Once | INTRAMUSCULAR | Status: AC
  Administered 2024-10-07: 1000 ug via INTRAMUSCULAR
  Filled 2024-10-07: qty 1

## 2024-10-21 ENCOUNTER — Inpatient Hospital Stay: Attending: Hematology and Oncology

## 2024-10-21 ENCOUNTER — Telehealth: Payer: Self-pay

## 2024-10-21 NOTE — Telephone Encounter (Signed)
 Multiple attempts made to contact patient regarding injection appointment.  LVM for patient with callback number to reschedule missed appointment.

## 2024-10-23 ENCOUNTER — Inpatient Hospital Stay

## 2024-11-04 ENCOUNTER — Inpatient Hospital Stay

## 2024-11-17 ENCOUNTER — Telehealth: Payer: Self-pay

## 2024-11-17 NOTE — Telephone Encounter (Signed)
 Confirmed the scheduled injection for 2/7 at 1230. Patient will arrive early due to another appointment elsewhere.

## 2024-11-18 ENCOUNTER — Inpatient Hospital Stay: Attending: Hematology and Oncology

## 2024-11-20 ENCOUNTER — Inpatient Hospital Stay: Admitting: Hematology and Oncology

## 2024-11-20 ENCOUNTER — Inpatient Hospital Stay

## 2024-12-02 ENCOUNTER — Inpatient Hospital Stay

## 2025-02-19 ENCOUNTER — Ambulatory Visit: Admitting: Dermatology
